# Patient Record
Sex: Male | Born: 1975 | Race: Black or African American | Hispanic: No | Marital: Married | Smoking: Current some day smoker
Health system: Southern US, Community
[De-identification: ages and names within clinical notes are randomized; demographics above are authoritative.]

## PROBLEM LIST (undated history)

## (undated) DIAGNOSIS — G4730 Sleep apnea, unspecified: Secondary | ICD-10-CM

## (undated) DIAGNOSIS — I10 Essential (primary) hypertension: Secondary | ICD-10-CM

## (undated) DIAGNOSIS — E119 Type 2 diabetes mellitus without complications: Secondary | ICD-10-CM

## (undated) DIAGNOSIS — I509 Heart failure, unspecified: Secondary | ICD-10-CM

## (undated) DIAGNOSIS — I2510 Atherosclerotic heart disease of native coronary artery without angina pectoris: Secondary | ICD-10-CM

## (undated) DIAGNOSIS — F329 Major depressive disorder, single episode, unspecified: Secondary | ICD-10-CM

## (undated) DIAGNOSIS — F32A Depression, unspecified: Secondary | ICD-10-CM

## (undated) DIAGNOSIS — E669 Obesity, unspecified: Secondary | ICD-10-CM

## (undated) DIAGNOSIS — J45909 Unspecified asthma, uncomplicated: Secondary | ICD-10-CM

## (undated) DIAGNOSIS — F309 Manic episode, unspecified: Secondary | ICD-10-CM

## (undated) DIAGNOSIS — F419 Anxiety disorder, unspecified: Secondary | ICD-10-CM

## (undated) DIAGNOSIS — E1169 Type 2 diabetes mellitus with other specified complication: Secondary | ICD-10-CM

## (undated) HISTORY — DX: Depression, unspecified: F32.A

## (undated) HISTORY — DX: Major depressive disorder, single episode, unspecified: F32.9

## (undated) HISTORY — PX: OTHER SURGICAL HISTORY: SHX169

---

## 1998-09-14 ENCOUNTER — Emergency Department (HOSPITAL_COMMUNITY): Admission: EM | Admit: 1998-09-14 | Discharge: 1998-09-14 | Payer: Self-pay | Admitting: Emergency Medicine

## 1998-09-14 ENCOUNTER — Encounter: Payer: Self-pay | Admitting: Emergency Medicine

## 1999-11-03 ENCOUNTER — Encounter: Payer: Self-pay | Admitting: Emergency Medicine

## 1999-11-03 ENCOUNTER — Emergency Department (HOSPITAL_COMMUNITY): Admission: EM | Admit: 1999-11-03 | Discharge: 1999-11-03 | Payer: Self-pay | Admitting: Emergency Medicine

## 1999-11-16 ENCOUNTER — Emergency Department (HOSPITAL_COMMUNITY): Admission: EM | Admit: 1999-11-16 | Discharge: 1999-11-16 | Payer: Self-pay | Admitting: Emergency Medicine

## 2000-02-27 ENCOUNTER — Emergency Department (HOSPITAL_COMMUNITY): Admission: EM | Admit: 2000-02-27 | Discharge: 2000-02-27 | Payer: Self-pay

## 2000-08-09 ENCOUNTER — Emergency Department (HOSPITAL_COMMUNITY): Admission: EM | Admit: 2000-08-09 | Discharge: 2000-08-09 | Payer: Self-pay | Admitting: Emergency Medicine

## 2001-07-20 ENCOUNTER — Encounter: Payer: Self-pay | Admitting: Emergency Medicine

## 2001-07-20 ENCOUNTER — Emergency Department (HOSPITAL_COMMUNITY): Admission: EM | Admit: 2001-07-20 | Discharge: 2001-07-20 | Payer: Self-pay | Admitting: Emergency Medicine

## 2002-01-14 ENCOUNTER — Emergency Department (HOSPITAL_COMMUNITY): Admission: EM | Admit: 2002-01-14 | Discharge: 2002-01-14 | Payer: Self-pay | Admitting: *Deleted

## 2003-06-30 ENCOUNTER — Emergency Department (HOSPITAL_COMMUNITY): Admission: EM | Admit: 2003-06-30 | Discharge: 2003-06-30 | Payer: Self-pay | Admitting: Emergency Medicine

## 2004-06-04 ENCOUNTER — Emergency Department (HOSPITAL_COMMUNITY): Admission: EM | Admit: 2004-06-04 | Discharge: 2004-06-04 | Payer: Self-pay | Admitting: Family Medicine

## 2007-02-25 ENCOUNTER — Emergency Department (HOSPITAL_COMMUNITY): Admission: EM | Admit: 2007-02-25 | Discharge: 2007-02-25 | Payer: Self-pay | Admitting: Emergency Medicine

## 2007-05-22 ENCOUNTER — Ambulatory Visit: Payer: Self-pay | Admitting: Internal Medicine

## 2007-05-22 ENCOUNTER — Ambulatory Visit: Payer: Self-pay | Admitting: *Deleted

## 2007-07-03 ENCOUNTER — Ambulatory Visit: Payer: Self-pay | Admitting: Internal Medicine

## 2007-07-03 LAB — CONVERTED CEMR LAB
Chlamydia, DNA Probe: NEGATIVE
GC Probe Amp, Genital: NEGATIVE

## 2007-12-07 ENCOUNTER — Emergency Department (HOSPITAL_COMMUNITY): Admission: EM | Admit: 2007-12-07 | Discharge: 2007-12-07 | Payer: Self-pay | Admitting: Emergency Medicine

## 2007-12-09 ENCOUNTER — Ambulatory Visit: Payer: Self-pay | Admitting: Internal Medicine

## 2007-12-09 LAB — CONVERTED CEMR LAB
ALT: 29 units/L (ref 0–53)
AST: 16 units/L (ref 0–37)
Albumin: 4.6 g/dL (ref 3.5–5.2)
Basophils Absolute: 0 10*3/uL (ref 0.0–0.1)
Basophils Relative: 0 % (ref 0–1)
CO2: 21 meq/L (ref 19–32)
Calcium: 8.9 mg/dL (ref 8.4–10.5)
Chloride: 104 meq/L (ref 96–112)
Cholesterol: 235 mg/dL — ABNORMAL HIGH (ref 0–200)
Helicobacter Pylori Antibody-IgG: 0.8
Hemoglobin: 15.2 g/dL (ref 13.0–17.0)
Lymphocytes Relative: 32 % (ref 12–46)
MCHC: 32.1 g/dL (ref 30.0–36.0)
Monocytes Absolute: 0.5 10*3/uL (ref 0.1–1.0)
Monocytes Relative: 7 % (ref 3–12)
Neutro Abs: 3.9 10*3/uL (ref 1.7–7.7)
Neutrophils Relative %: 58 % (ref 43–77)
Potassium: 4.3 meq/L (ref 3.5–5.3)
RBC: 5.52 M/uL (ref 4.22–5.81)

## 2008-04-19 ENCOUNTER — Emergency Department (HOSPITAL_COMMUNITY): Admission: EM | Admit: 2008-04-19 | Discharge: 2008-04-19 | Payer: Self-pay | Admitting: Emergency Medicine

## 2008-04-24 ENCOUNTER — Ambulatory Visit: Payer: Self-pay | Admitting: Psychiatry

## 2008-04-24 ENCOUNTER — Emergency Department (HOSPITAL_COMMUNITY): Admission: EM | Admit: 2008-04-24 | Discharge: 2008-04-24 | Payer: Self-pay | Admitting: Emergency Medicine

## 2008-04-24 ENCOUNTER — Inpatient Hospital Stay (HOSPITAL_COMMUNITY): Admission: AD | Admit: 2008-04-24 | Discharge: 2008-04-27 | Payer: Self-pay | Admitting: Psychiatry

## 2008-06-15 ENCOUNTER — Ambulatory Visit: Payer: Self-pay | Admitting: Internal Medicine

## 2008-06-15 ENCOUNTER — Encounter (INDEPENDENT_AMBULATORY_CARE_PROVIDER_SITE_OTHER): Payer: Self-pay | Admitting: Adult Health

## 2008-06-15 LAB — CONVERTED CEMR LAB
Chlamydia, DNA Probe: POSITIVE — AB
GC Probe Amp, Genital: NEGATIVE
HIV-1 antibody: NEGATIVE
HIV-2 Ab: NEGATIVE
HIV: REACTIVE

## 2008-09-25 ENCOUNTER — Ambulatory Visit: Payer: Self-pay | Admitting: Internal Medicine

## 2008-09-25 ENCOUNTER — Ambulatory Visit (HOSPITAL_COMMUNITY): Admission: RE | Admit: 2008-09-25 | Discharge: 2008-09-25 | Payer: Self-pay | Admitting: Internal Medicine

## 2008-12-18 ENCOUNTER — Ambulatory Visit: Payer: Self-pay | Admitting: Family Medicine

## 2009-03-23 ENCOUNTER — Encounter (INDEPENDENT_AMBULATORY_CARE_PROVIDER_SITE_OTHER): Payer: Self-pay | Admitting: Adult Health

## 2009-03-23 ENCOUNTER — Ambulatory Visit: Payer: Self-pay | Admitting: Family Medicine

## 2010-06-28 LAB — BASIC METABOLIC PANEL
BUN: 8 mg/dL (ref 6–23)
CO2: 23 mEq/L (ref 19–32)
Chloride: 105 mEq/L (ref 96–112)
Glucose, Bld: 109 mg/dL — ABNORMAL HIGH (ref 70–99)
Potassium: 3.7 mEq/L (ref 3.5–5.1)

## 2010-06-28 LAB — URINE MICROSCOPIC-ADD ON

## 2010-06-28 LAB — URINALYSIS, ROUTINE W REFLEX MICROSCOPIC
Glucose, UA: NEGATIVE mg/dL
Ketones, ur: NEGATIVE mg/dL
Leukocytes, UA: NEGATIVE
Nitrite: NEGATIVE
Protein, ur: 100 mg/dL — AB
Urobilinogen, UA: 0.2 mg/dL (ref 0.0–1.0)

## 2010-06-28 LAB — CBC
HCT: 46.7 % (ref 39.0–52.0)
MCHC: 34.2 g/dL (ref 30.0–36.0)
MCV: 83 fL (ref 78.0–100.0)
Platelets: 285 10*3/uL (ref 150–400)
RDW: 13.1 % (ref 11.5–15.5)

## 2010-06-28 LAB — DIFFERENTIAL
Basophils Absolute: 0 10*3/uL (ref 0.0–0.1)
Eosinophils Absolute: 0.1 10*3/uL (ref 0.0–0.7)
Eosinophils Relative: 1 % (ref 0–5)

## 2010-06-28 LAB — RAPID URINE DRUG SCREEN, HOSP PERFORMED
Amphetamines: NOT DETECTED
Benzodiazepines: NOT DETECTED
Cocaine: NOT DETECTED
Tetrahydrocannabinol: POSITIVE — AB

## 2010-07-26 NOTE — Consult Note (Signed)
NAMELINC, RENNE      ACCOUNT NO.:  [id number]   MEDICAL RECORD NO.:  [id number]          PATIENT TYPE:  IPS   LOCATION:  0300                          FACILITY:  [facility]   PHYSICIAN:  Antonietta Breach, M.D.  DATE OF BIRTH:  07-Feb-1976   DATE OF CONSULTATION:  04/27/2008  DATE OF DISCHARGE:  04/27/2008                                 CONSULTATION   REQUESTING PHYSICIAN:  [facility].   REASON FOR CONSULTATION:  Psychosis.   HISTORY OF PRESENT ILLNESS:  [name] is a 35 year old male  who presented to the [facility] with severe hallucinations and  delusions.  He has been experiencing command destructive auditory  hallucinations towards himself and others.  He is experiencing  catastrophic anxiety.  He is aware of many of the symptoms and wants  relief.  He is not combative.  He is cooperative and redirectable.  He  does have intact orientation and memory function.  He was on psychiatric  medication for previous similar episode but stopped it.   When [name] was 35 years old, he had a similar episode and  was admitted to the [facility] unit.  He does not  recall the medication.   FAMILY PSYCHIATRIC HISTORY:  None known.   SOCIAL HISTORY:  [name] does have a grandmother who lives  in the area.   PAST MEDICAL HISTORY:  Please see the emergency room physician's record.   MENTAL STATUS EXAM:  [name] is alert.  He has a very  anxious affect.  Mood is extremely anxious.  He is oriented to all  spheres.  His memory is intact to immediate, recent and remote.  His  speech is pressured.  Thought process is coherent.  Thought content:  Severe hallucinations as described above.  His insight is partial.  His  judgment is impaired.   ASSESSMENT:  AXIS I:  293.82.  Psychotic disorder, not otherwise  specified.  AXIS II:  Deferred.  AXIS III:  See the emergency physician's record.  AXIS IV:  Primary support group.  AXIS V:  20.   RECOMMENDATIONS:  1. Would admit to an inpatient psychiatric ward.  2. Would provide Ativan 0.5 mg to 4 mg p.o., IM or IV q. 1 hour p.r.n.  3. If necessary, would utilize Geodon 20 mg IM t.i.d. p.r.n.,failed      Ativan.      Antonietta Breach, M.D.  Electronically Signed     JW/MEDQ  D:  04/27/2008  T:  04/28/2008  Job:  [id number]

## 2010-07-26 NOTE — Discharge Summary (Signed)
Jordan Sparks, Jordan Sparks      ACCOUNT NO.:  [id number]   MEDICAL RECORD NO.:  [id number]          PATIENT TYPE:  IPS   LOCATION:  0300                          FACILITY:  BH   PHYSICIAN:  Anselm Jungling, MD  DATE OF BIRTH:  30-Aug-1975   DATE OF ADMISSION:  04/24/2008  DATE OF DISCHARGE:  04/27/2008                               DISCHARGE SUMMARY   IDENTIFYING DATA AND REASON FOR ADMISSION:  The patient is a 35 year old  single male who was admitted to inpatient psychiatry due to concerns  regarding auditory hallucinations and expressed homicidal ideation.  Please refer to the admission note for further details pertaining to the  symptoms, circumstances and history that led to his hospitalization.  He  was given an initial Axis I diagnosis of psychosis, NOS.   MEDICAL AND LABORATORY:  The patient was medically and physically  assessed by the psychiatric nurse practitioner.  He was in good health  without any active or chronic medical problems.  There were no  significant medical issues.   HOSPITAL COURSE:  The patient was admitted to the adult inpatient  psychiatric service.  He presented as a large male who initially had  difficulty responding to questions regarding his orientation.  He fell  asleep rapidly, and this was probably due to p.r.n. medication that he  had received.   However, the following day, the patient was up, dressed, and groomed.  He was pleasant, alert, relaxed, very polite, and nonpsychotic.  He  indicated that he had no further experience of auditory hallucinations  since he first received psychotropic medication with [place].  He slept very  well.  He seemed to have good insight and reality testing, and although  he understood and accepted that he had an episode of psychosis and  severe mental disorganization, he had difficulty explaining or  presenting any hypothesis as to why this had happened to him.  He denied  flatly any psychotropic drug use or  alcohol abuse.  He indicated that he  thought he had been stressed recently and needed help in coping with  this, and indicated accordingly that he was very open to outpatient  psychotherapy.  He was agreeable to his family being contacted.  The  patient's plan was to live with his grandmother, and he gave permission  for the case manager to call the grandmother and confirm that he could  live with her.  She indicated to the case manager that he could indeed  stay with her and she indicated that she had no concerns about the  patient being discharged to her on that day, the fourth hospital day.  The patient agreed to the  following aftercare plan.   AFTERCARE:  The patient was to follow-up at [facility] with an appointment on May 06, 2008 at 1:30 p.m.  He was  also referred to [facility] on [facility], where he was  instructed as to how and when to call them for an intake appointment.   DISCHARGE MEDICATIONS:  None.   DISCHARGE DIAGNOSES:  AXIS I: Brief psychotic episode, NOS, unknown  etiology, resolved.  AXIS II:  Deferred.  AXIS III: No acute or chronic illnesses.  AXIS IV: Stressors severe.  AXIS V: GAF on discharge 75.      Anselm Jungling, MD  Electronically Signed     SPB/MEDQ  D:  04/27/2008  T:  04/27/2008  Job:  [id number]

## 2010-12-16 LAB — DIFFERENTIAL
Basophils Absolute: 0.1
Eosinophils Relative: 3
Lymphocytes Relative: 23
Monocytes Absolute: 0.7
Monocytes Relative: 9

## 2010-12-16 LAB — COMPREHENSIVE METABOLIC PANEL
AST: 33
Albumin: 3.7
Alkaline Phosphatase: 83
Chloride: 106
Creatinine, Ser: 1.12
GFR calc Af Amer: >60
Potassium: 3.6
Total Bilirubin: 0.5
Total Protein: 6.5

## 2010-12-16 LAB — URINALYSIS, ROUTINE W REFLEX MICROSCOPIC
Bilirubin Urine: NEGATIVE
Glucose, UA: NEGATIVE
Hgb urine dipstick: NEGATIVE
Specific Gravity, Urine: 1.029
Urobilinogen, UA: 1

## 2010-12-16 LAB — CBC
Platelets: 253
RDW: 13.2
WBC: 7.7

## 2011-07-31 ENCOUNTER — Emergency Department (HOSPITAL_COMMUNITY)
Admission: EM | Admit: 2011-07-31 | Discharge: 2011-07-31 | Disposition: A | Discharge status: Home / Self Care | Payer: Self-pay | Attending: Emergency Medicine | Admitting: Emergency Medicine

## 2011-07-31 ENCOUNTER — Encounter (HOSPITAL_COMMUNITY): Payer: Self-pay | Admitting: *Deleted

## 2011-07-31 DIAGNOSIS — F319 Bipolar disorder, unspecified: Secondary | ICD-10-CM | POA: Insufficient documentation

## 2011-07-31 HISTORY — DX: Manic episode, unspecified: F30.9

## 2011-07-31 HISTORY — DX: Anxiety disorder, unspecified: F41.9

## 2011-07-31 MED ORDER — LORAZEPAM 1 MG PO TABS
2.0000 mg | ORAL_TABLET | Freq: Once | ORAL | Status: AC
  Administered 2011-07-31: 2 mg via ORAL
  Filled 2011-07-31: qty 2

## 2011-07-31 NOTE — Discharge Instructions (Signed)
Follow up at the [facility] or [facility] tomorrow for treatment.

## 2011-07-31 NOTE — ED Notes (Signed)
Patient states he has been out of mental health medications x 8 months and he feels that if he doesn't get medications refilled he feels he may have outburst,  Patient denies suicidal or homicidal thoughts, he feels increase in anxiety and on edge

## 2011-07-31 NOTE — ED Provider Notes (Signed)
History  This chart was scribed for Cheri Guppy, MD by Bennett Scrape. This patient was seen in room STRE8/STRE8 and the patient's care was started at 2:59PM.  CSN: [id number]  Arrival date & time 07/31/11  1259   First MD Initiated Contact with Patient 07/31/11 1459      Chief Complaint  Patient presents with  . Medication Refill    mental health meds    The history is provided by the patient. No language interpreter was used.    Jordan Sparks is a 36 y.o. male with a h/o manic disorder who presents to the Emergency Department requesting a lithium, adderall and androxan   refill. Pt states that he has been off of his mental health medications for 8 months "due to my own stupidity". Pt states that he feels like if he doesn't get his medications refilled he might hurt someone. Pt states that his anxiety is increasing and he feels "on the edge". He denies SI and HI currently. He denies any other symptoms including fever, cough and nausea. He is a current everyday smoker but denies alcohol use.   Past Medical History  Diagnosis Date  . Anxiety   . Manic disorder     History reviewed. No pertinent past surgical history.  No family history on file.  History  Substance Use Topics  . Smoking status: Current Everyday Smoker  . Smokeless tobacco: Not on file  . Alcohol Use: No      Review of Systems  Constitutional: Negative for fever and chills.  Respiratory: Negative for cough and shortness of breath.   Gastrointestinal: Negative for nausea and vomiting.  Psychiatric/Behavioral: Negative for suicidal ideas.    Allergies  Review of patient's allergies indicates no known allergies.  Home Medications  No current outpatient prescriptions on file.  Triage Vitals: BP 134/91  Temp(Src) 98.3 F (36.8 C) (Oral)  Resp 20  SpO2 94%  Physical Exam  Nursing note and vitals reviewed. Constitutional: He is oriented to person, place, and time. He appears  well-developed and well-nourished. No distress.  HENT:  Head: Normocephalic and atraumatic.  Eyes: EOM are normal.  Neck: Neck supple. No tracheal deviation present.  Cardiovascular: Normal rate.   Pulmonary/Chest: Effort normal. No respiratory distress.  Musculoskeletal: Normal range of motion.  Neurological: He is alert and oriented to person, place, and time.  Skin: Skin is warm and dry.  Psychiatric: He has a normal mood and affect. His behavior is normal. His speech is rapid and/or pressured. He expresses no homicidal and no suicidal ideation.    ED Course  Procedures (including critical care time)  DIAGNOSTIC STUDIES: Oxygen Saturation is 94% on room air, adequate by my interpretation.    COORDINATION OF CARE: 3:05PM-Discussed need for consultation and pt agreed.  3:14PM-Asked psych to look over pt's case to get him the proper treatment.  3:30PM-Informed pt that I have requested for psych to give an evaluation and that we are still waiting on their evaluation.  4:03PM-informed pt of the two appointments that psych has set up. Pt was given Ativan.  Labs Reviewed - No data to display No results found.   No diagnosis found.    MDM  manic      I personally performed the services described in this documentation, which was scribed in my presence. The recorded information has been reviewed and considered.     Cheri Guppy, MD 08/03/11 2303

## 2012-04-08 ENCOUNTER — Ambulatory Visit (HOSPITAL_COMMUNITY)
Admission: RE | Admit: 2012-04-08 | Discharge: 2012-04-08 | Disposition: A | Discharge status: Home / Self Care | Payer: Self-pay | Attending: Psychiatry | Admitting: Psychiatry

## 2012-04-20 ENCOUNTER — Encounter (HOSPITAL_COMMUNITY): Payer: Self-pay | Admitting: Behavioral Health

## 2012-04-20 ENCOUNTER — Telehealth (HOSPITAL_COMMUNITY): Payer: Self-pay | Admitting: Behavioral Health

## 2012-04-20 NOTE — BH Assessment (Addendum)
Assessment Note  Late Note from 04/08/12.  Jordan Sparks is an 37 y.o. male and presents to [facility] as a walk-in due to increased depressive symptoms and accompanied by his live in girlfriend. Pt denies SI, HI, AV hallucinations, delusions or psychosis. Pt confirms sa use of cannabis daily, last use 04/06/12 and denies any other sa use. Pt reports occasional beer drinking, last use 04/06/12. Pt reports that he has run out of his medications for his past diagnosis of bi-polar and depression. Pt reports that his agitation and irritability are increasing and "I worry about being a burden to my girl. 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 Pt reports that he constantly has thought of "I'm supposed to be taking care of my family (has a 2 yo son and 63 yo daughter) and I can't so I feel guilty all the time". Pt said "I worry about her (girlfriend) having to take care of all the bills". Pt reports that he has been in a 10 yr relationship with her and she "is my only support, she been there for me through all of this". Pt confirms that he receives outpatient mh and medication services through [facility] and missed his appointment earlier this month. Pt reports an increase in irritability,agitation, isolation, loss of interest in usual pleasure, guilt, tearfulness and decreases in sleep (2-3 hrs in 24), concentration and recent memory, pt's remote memory is intact. Pt denies physical, sexual and emotional abuse. Pt is unaware of family mh hx. Pt reports a recent encounter with a male family and his increased agitation and anger that "I wanted to just smash him cuz he kept pushing my buttons, even after I told him to back of". Pt confirms that he needs to get back on track with his appointments and medication regimen with his outpatient provider. Pt does not meet criteria for inpatient admission and is MSE cleared by Verne Spurr, PA. Pt is referred back to [facility] for follow-up. Ranae Pila, LCAS  Axis I: Mood Disorder NOS Axis II: Deferred Axis III:  Past Medical History  Diagnosis Date  . Anxiety   . Manic disorder   . Depression    Axis IV: economic problems, occupational problems, problems related to social environment and problems with primary support group Axis V: 41-50 serious symptoms  Past Medical History:  Past Medical History  Diagnosis Date  . Anxiety   . Manic disorder   . Depression     No past surgical history on file.  Family History: No family history on file.  Social History:  reports that he has been smoking.  He does not have any smokeless tobacco history on file. He reports that he does not drink alcohol or use illicit drugs.  Additional Social History:  Alcohol / Drug Use Pain Medications: pt denies Prescriptions: pt denies Over the Counter: pt denies History of alcohol / drug use?: Yes Substance #1 Name of Substance 1: etoh 1 - Age of First Use: 37 yo 1 - Amount (size/oz): varies 1 - Frequency: weekly 1 - Duration: 21 yrs 1 - Last Use / Amount: 04/06/12  CIWA: CIWA-Ar Nausea and Vomiting: no nausea and no vomiting Tactile Disturbances: none Tremor: no tremor Auditory Disturbances: not present Paroxysmal Sweats: no sweat visible Visual Disturbances: not present Anxiety: two Headache, Fullness in Head: none present Agitation: somewhat more than normal activity Orientation and Clouding of Sensorium: oriented and can do serial additions CIWA-Ar Total: 3 COWS: Clinical  Opiate Withdrawal Scale (COWS) Sweating: No report of chills or flushing Restlessness: Reports difficulty sitting still, but is able to do so Pupil Size: Pupils pinned or normal size for room light Bone or Joint Aches: Not present Runny Nose or Tearing: Not present GI Upset: No GI symptoms Tremor: No tremor Yawning: No yawning Anxiety or Irritability: Patient obviously irritable/anxious Gooseflesh Skin: Skin is smooth  Allergies: No Known  Allergies  Home Medications:  (Not in a hospital admission)  OB/GYN Status:  No LMP for male patient.  General Assessment Data Location of Assessment: [facility] Assessment Services Living Arrangements: Spouse/significant other;Children Can pt return to current living arrangement?: Yes Admission Status: Voluntary Is patient capable of signing voluntary admission?: Yes Transfer from: Home Referral Source: Self/Family/Friend     Risk to self Suicidal Ideation: No Suicidal Intent: No Is patient at risk for suicide?: No Suicidal Plan?: No Access to Means: No What has been your use of drugs/alcohol within the last 12 months?:  (pt reports smoking cannabis) Previous Attempts/Gestures: No How many times?:  (0) Other Self Harm Risks:  (none noted) Triggers for Past Attempts: None known Intentional Self Injurious Behavior: None Family Suicide History: Unknown Recent stressful life event(s): Job Loss;Financial Problems Persecutory voices/beliefs?: No Depression: Yes Depression Symptoms: Insomnia;Isolating;Fatigue;Guilt;Feeling worthless/self pity;Feeling angry/irritable Suicide prevention information given to non-admitted patients: Not applicable  Risk to Others Homicidal Ideation: No Thoughts of Harm to Others: Yes-Currently Present Comment - Thoughts of Harm to Others:  (pt reports a male family member pushing his buttons) Current Homicidal Intent: No Current Homicidal Plan: No Access to Homicidal Means: No Identified Victim:  (none noted) History of harm to others?: No Assessment of Violence: None Noted Violent Behavior Description:  (alert, calm, cooperative) Does patient have access to weapons?: No Criminal Charges Pending?: No Does patient have a court date: No  Psychosis Hallucinations: None noted Delusions: None noted  Mental Status Report Appear/Hygiene:  (casual winter wear) Eye Contact: Good Motor Activity: Freedom of movement Speech: Logical/coherent Level of  Consciousness: Alert Mood: Depressed Affect: Appropriate to circumstance Anxiety Level: Minimal Thought Processes: Coherent;Relevant Judgement: Unimpaired Orientation: Person;Place;Time;Situation;Appropriate for developmental age Obsessive Compulsive Thoughts/Behaviors: Moderate (pt reports not wanting to burden his significant other)  Cognitive Functioning Concentration: Decreased Memory: Remote Intact;Recent Intact IQ: Average Insight: Good Impulse Control: Fair Appetite: Good Weight Loss:  (0) Weight Gain:  (0) Sleep: Decreased Total Hours of Sleep:  (pt reports 2-3/24) Vegetative Symptoms: None  ADLScreening [facility] Services) Patient's cognitive ability adequate to safely complete daily activities?: Yes Patient able to express need for assistance with ADLs?: Yes Independently performs ADLs?: Yes (appropriate for developmental age)  Abuse/Neglect [facility]) Physical Abuse: Denies Verbal Abuse: Denies Sexual Abuse: Denies  Prior Inpatient Therapy Prior Inpatient Therapy: Yes Prior Therapy Dates:  (pt reports 2013 and maybe 2012) Prior Therapy Facilty/Provider(s):  [facility]) Reason for Treatment:  (pt reports depression)  Prior Outpatient Therapy Prior Outpatient Therapy: Yes Prior Therapy Dates:  (pt reports 2012, 2013, 2014) Prior Therapy Facilty/Provider(s):  (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) Reason for Treatment:  (pt reports depression)  ADL Screening (condition at time of admission) Patient's cognitive ability adequate to safely complete daily activities?: Yes Patient able to express need for assistance with ADLs?: Yes Independently performs ADLs?: Yes (appropriate for developmental age) Weakness of Legs: None Weakness of Arms/Hands: None  Home Assistive Devices/Equipment Home Assistive Devices/Equipment: None  Therapy Consults (therapy consults require a physician order) PT Evaluation Needed: No OT Evalulation Needed: No SLP Evaluation Needed: No Abuse/Neglect  Assessment (  Assessment to be complete while patient is alone) Physical Abuse: Denies Verbal Abuse: Denies Sexual Abuse: Denies Exploitation of patient/patient's resources: Denies Self-Neglect: Denies Values / Beliefs Cultural Requests During Hospitalization: None Spiritual Requests During Hospitalization: None Consults Spiritual Care Consult Needed: No Social Work Consult Needed: No Merchant navy officer (For Healthcare) Advance Directive: Patient does not have advance directive Pre-existing out of facility DNR order (yellow form or pink MOST form): No Nutrition Screen- [facility] Adult/WL/AP Patient's home diet: Regular Have you recently lost weight without trying?: No Have you been eating poorly because of a decreased appetite?: No Malnutrition Screening Tool Score: 0  Additional Information 1:1 In Past 12 Months?: No CIRT Risk: No Elopement Risk: No Does patient have medical clearance?: No     Disposition: Pt completed MSE with Verne Spurr, PA and pt referred back to home provider [facility] to continue outpatient and medication services. Disposition Disposition of Patient: Outpatient treatment;Referred to (pt referred back to home provider [facility]) Type of outpatient treatment: Adult Patient referred to: [facility] to continue services)  On Site Evaluation by:   Reviewed with Physician:     Manual Meier 04/20/2012 9:46 AM

## 2015-10-01 ENCOUNTER — Ambulatory Visit (HOSPITAL_COMMUNITY): Admission: EM | Admit: 2015-10-01 | Discharge: 2015-10-01 | Discharge status: Absent without leave | Payer: Self-pay

## 2015-10-26 DIAGNOSIS — R430 Anosmia: Secondary | ICD-10-CM | POA: Insufficient documentation

## 2015-10-26 DIAGNOSIS — Q892 Congenital malformations of other endocrine glands: Secondary | ICD-10-CM | POA: Insufficient documentation

## 2015-10-26 DIAGNOSIS — J343 Hypertrophy of nasal turbinates: Secondary | ICD-10-CM | POA: Insufficient documentation

## 2015-10-26 DIAGNOSIS — K219 Gastro-esophageal reflux disease without esophagitis: Secondary | ICD-10-CM | POA: Insufficient documentation

## 2015-10-26 DIAGNOSIS — F17200 Nicotine dependence, unspecified, uncomplicated: Secondary | ICD-10-CM | POA: Insufficient documentation

## 2015-10-26 DIAGNOSIS — R0683 Snoring: Secondary | ICD-10-CM | POA: Insufficient documentation

## 2015-10-26 DIAGNOSIS — J324 Chronic pansinusitis: Secondary | ICD-10-CM | POA: Insufficient documentation

## 2015-12-02 ENCOUNTER — Other Ambulatory Visit (HOSPITAL_BASED_OUTPATIENT_CLINIC_OR_DEPARTMENT_OTHER): Payer: Self-pay

## 2015-12-02 DIAGNOSIS — R0683 Snoring: Secondary | ICD-10-CM

## 2015-12-26 ENCOUNTER — Ambulatory Visit (HOSPITAL_BASED_OUTPATIENT_CLINIC_OR_DEPARTMENT_OTHER): Payer: Self-pay | Attending: Otolaryngology | Admitting: Internal Medicine

## 2016-01-04 ENCOUNTER — Ambulatory Visit (HOSPITAL_BASED_OUTPATIENT_CLINIC_OR_DEPARTMENT_OTHER): Payer: Self-pay | Attending: Otolaryngology | Admitting: Internal Medicine

## 2016-01-04 VITALS — Ht 70.0 in | Wt 290.0 lb

## 2016-01-04 DIAGNOSIS — G4733 Obstructive sleep apnea (adult) (pediatric): Secondary | ICD-10-CM

## 2016-01-04 DIAGNOSIS — R0683 Snoring: Secondary | ICD-10-CM | POA: Insufficient documentation

## 2016-01-04 DIAGNOSIS — E669 Obesity, unspecified: Secondary | ICD-10-CM | POA: Insufficient documentation

## 2016-01-15 DIAGNOSIS — R0683 Snoring: Secondary | ICD-10-CM

## 2016-01-15 NOTE — Procedures (Signed)
  Patient Name: Jordan Sparks, Hosp Date: 01/04/2016 Gender: Male D.O.B: [birth year] Age (years): 40 Referring Provider: Serena Colonel Height (inches): 70 Interpreting Physician: Jetty Duhamel MD, ABSM Weight (lbs): 290 RPSGT: Ulyess Mort BMI: 42 MRN: [id number] Neck Size: 19.00 CLINICAL INFORMATION Sleep Study Type: NPSG Indication for sleep study: Hypertension, Obesity Epworth Sleepiness Score: 19  SLEEP STUDY TECHNIQUE As per the AASM Manual for the Scoring of Sleep and Associated Events v2.3 (April 2016) with a hypopnea requiring 4% desaturations. The channels recorded and monitored were frontal, central and occipital EEG, electrooculogram (EOG), submentalis EMG (chin), nasal and oral airflow, thoracic and abdominal wall motion, anterior tibialis EMG, snore microphone, electrocardiogram, and pulse oximetry.  MEDICATIONS Medications self-administered by patient taken the night of the study : none reported  SLEEP ARCHITECTURE The study was initiated at 9:48:01 PM and ended at 3:48:33 AM. Sleep onset time was 9.4 minutes and the sleep efficiency was 73.2%. The total sleep time was 264.0 minutes. Stage REM latency was 222.5 minutes. The patient spent 25.38% of the night in stage N1 sleep, 73.48% in stage N2 sleep, 0.00% in stage N3 and 1.14% in REM. Alpha intrusion was absent. Supine sleep was 8.01%.  RESPIRATORY PARAMETERS The overall apnea/hypopnea index (AHI) was 29.8 per hour. There were 18 total apneas, including 14 obstructive, 2 central and 2 mixed apneas. There were 113 hypopneas and 48 RERAs. The AHI during Stage REM sleep was 0.0 per hour. AHI while supine was 107.8 per hour. The mean oxygen saturation was 95.13%. The minimum SpO2 during sleep was 84.00%. Moderate snoring was noted during this study.  CARDIAC DATA The 2 lead EKG demonstrated sinus rhythm. The mean heart rate was 73.14 beats per minute. Other EKG findings include: PACs.  LEG MOVEMENT  DATA The total PLMS were 0 with a resulting PLMS index of 0.00. Associated arousal with leg movement index was 0.0 .  IMPRESSIONS - Moderate obstructive sleep apnea occurred during this study (AHI = 29.8/h). - No significant central sleep apnea occurred during this study (CAI = 0.5/h). - Mild oxygen desaturation was noted during this study (Min O2 = 84.00%). - The patient snored with Moderate snoring volume. - No cardiac abnormalities were noted during this study. - Clinically significant periodic limb movements did not occur during sleep. No significant associated arousals. - Bathroom x 4  DIAGNOSIS - Obstructive Sleep Apnea (327.23 [G47.33 ICD-10])  RECOMMENDATIONS - Therapeutic CPAP titration to determine optimal pressure required to alleviate sleep disordered breathing. - Positional therapy avoiding supine position during sleep. - Avoid alcohol, sedatives and other CNS depressants that may worsen sleep apnea and disrupt normal sleep architecture. - Sleep hygiene should be reviewed to assess factors that may improve sleep quality. - Weight management and regular exercise should be initiated or continued if appropriate.  [Electronically signed] 01/15/2016 12:46 PM  Jetty Duhamel MD, ABSM Diplomate, American Board of Sleep Medicine   NPI: [id number]  Waymon Budge Diplomate, American Board of Sleep Medicine  ELECTRONICALLY SIGNED ON:  01/15/2016, 12:46 PM [facility] PH: [phone]   FX: [phone] ACCREDITED BY THE AMERICAN ACADEMY OF SLEEP MEDICINE

## 2016-03-24 DIAGNOSIS — F17210 Nicotine dependence, cigarettes, uncomplicated: Secondary | ICD-10-CM | POA: Insufficient documentation

## 2016-03-24 DIAGNOSIS — Z5321 Procedure and treatment not carried out due to patient leaving prior to being seen by health care provider: Secondary | ICD-10-CM | POA: Insufficient documentation

## 2016-03-24 DIAGNOSIS — R1110 Vomiting, unspecified: Secondary | ICD-10-CM | POA: Insufficient documentation

## 2016-03-24 DIAGNOSIS — R52 Pain, unspecified: Secondary | ICD-10-CM | POA: Insufficient documentation

## 2016-03-24 DIAGNOSIS — R197 Diarrhea, unspecified: Secondary | ICD-10-CM | POA: Insufficient documentation

## 2016-03-24 NOTE — ED Triage Notes (Signed)
Patient is from home and transported via [facility]. Pt is experiencing flu-like symptoms of chills, body aches, vomiting and diarrhea. Pt reported to EMS he took IBUPROFEN but unknown time of administration.

## 2016-03-25 ENCOUNTER — Encounter (HOSPITAL_COMMUNITY): Payer: Self-pay | Admitting: Family Medicine

## 2016-03-25 ENCOUNTER — Emergency Department (HOSPITAL_COMMUNITY)
Admission: EM | Admit: 2016-03-25 | Discharge: 2016-03-25 | Disposition: A | Discharge status: Home / Self Care | Payer: Self-pay | Attending: Emergency Medicine | Admitting: Emergency Medicine

## 2016-03-25 MED ORDER — ACETAMINOPHEN 325 MG PO TABS
650.0000 mg | ORAL_TABLET | Freq: Once | ORAL | Status: AC | PRN
  Administered 2016-03-25: 650 mg via ORAL
  Filled 2016-03-25: qty 2

## 2016-03-25 NOTE — ED Notes (Signed)
Pt called x2 to be roomed.  Pt did not respond.  RN notified.

## 2016-03-25 NOTE — ED Notes (Signed)
Pt called for third time  No response from lobby 

## 2018-02-08 ENCOUNTER — Other Ambulatory Visit: Payer: Self-pay

## 2018-02-08 ENCOUNTER — Observation Stay (HOSPITAL_COMMUNITY)
Admission: EM | Admit: 2018-02-08 | Discharge: 2018-02-09 | Disposition: A | Discharge status: Home / Self Care | Payer: Self-pay | Attending: Internal Medicine | Admitting: Internal Medicine

## 2018-02-08 ENCOUNTER — Emergency Department (HOSPITAL_COMMUNITY): Payer: Self-pay

## 2018-02-08 ENCOUNTER — Encounter (HOSPITAL_COMMUNITY): Payer: Self-pay

## 2018-02-08 DIAGNOSIS — R0789 Other chest pain: Secondary | ICD-10-CM | POA: Diagnosis present

## 2018-02-08 DIAGNOSIS — F419 Anxiety disorder, unspecified: Secondary | ICD-10-CM | POA: Diagnosis present

## 2018-02-08 DIAGNOSIS — R112 Nausea with vomiting, unspecified: Secondary | ICD-10-CM | POA: Diagnosis present

## 2018-02-08 DIAGNOSIS — R739 Hyperglycemia, unspecified: Secondary | ICD-10-CM | POA: Diagnosis present

## 2018-02-08 DIAGNOSIS — F39 Unspecified mood [affective] disorder: Secondary | ICD-10-CM | POA: Diagnosis present

## 2018-02-08 DIAGNOSIS — F309 Manic episode, unspecified: Secondary | ICD-10-CM | POA: Diagnosis present

## 2018-02-08 DIAGNOSIS — R079 Chest pain, unspecified: Secondary | ICD-10-CM

## 2018-02-08 DIAGNOSIS — F329 Major depressive disorder, single episode, unspecified: Secondary | ICD-10-CM | POA: Insufficient documentation

## 2018-02-08 DIAGNOSIS — E876 Hypokalemia: Secondary | ICD-10-CM | POA: Diagnosis present

## 2018-02-08 DIAGNOSIS — I10 Essential (primary) hypertension: Secondary | ICD-10-CM | POA: Diagnosis present

## 2018-02-08 DIAGNOSIS — E1165 Type 2 diabetes mellitus with hyperglycemia: Principal | ICD-10-CM | POA: Insufficient documentation

## 2018-02-08 DIAGNOSIS — Z79899 Other long term (current) drug therapy: Secondary | ICD-10-CM | POA: Insufficient documentation

## 2018-02-08 DIAGNOSIS — F32A Depression, unspecified: Secondary | ICD-10-CM | POA: Diagnosis present

## 2018-02-08 DIAGNOSIS — D72829 Elevated white blood cell count, unspecified: Secondary | ICD-10-CM | POA: Diagnosis present

## 2018-02-08 DIAGNOSIS — K219 Gastro-esophageal reflux disease without esophagitis: Secondary | ICD-10-CM | POA: Diagnosis present

## 2018-02-08 DIAGNOSIS — Z7984 Long term (current) use of oral hypoglycemic drugs: Secondary | ICD-10-CM | POA: Insufficient documentation

## 2018-02-08 HISTORY — DX: Essential (primary) hypertension: I10

## 2018-02-08 LAB — CBC WITH DIFFERENTIAL/PLATELET
Abs Immature Granulocytes: 0 10*3/uL (ref 0.00–0.07)
BASOS PCT: 0 %
Basophils Absolute: 0 10*3/uL (ref 0.0–0.1)
EOS ABS: 0.2 10*3/uL (ref 0.0–0.5)
EOS PCT: 2 %
HCT: 41.9 % (ref 39.0–52.0)
Hemoglobin: 14 g/dL (ref 13.0–17.0)
Lymphocytes Relative: 12 %
Lymphs Abs: 1.4 10*3/uL (ref 0.7–4.0)
MCH: 26.2 pg (ref 26.0–34.0)
MCHC: 33.4 g/dL (ref 30.0–36.0)
MCV: 78.5 fL — AB (ref 80.0–100.0)
MONO ABS: 0.5 10*3/uL (ref 0.1–1.0)
Monocytes Relative: 4 %
Neutro Abs: 9.3 10*3/uL — ABNORMAL HIGH (ref 1.7–7.7)
Neutrophils Relative %: 82 %
PLATELETS: 295 10*3/uL (ref 150–400)
RBC: 5.34 MIL/uL (ref 4.22–5.81)
RDW: 12 % (ref 11.5–15.5)
WBC: 11.4 10*3/uL — AB (ref 4.0–10.5)
nRBC: 0 % (ref 0.0–0.2)

## 2018-02-08 LAB — COMPREHENSIVE METABOLIC PANEL
ALBUMIN: 3.6 g/dL (ref 3.5–5.0)
ALT: 31 U/L (ref 0–44)
ANION GAP: 13 (ref 5–15)
AST: 18 U/L (ref 15–41)
Alkaline Phosphatase: 112 U/L (ref 38–126)
BUN: 11 mg/dL (ref 6–20)
CO2: 25 mmol/L (ref 22–32)
Calcium: 9 mg/dL (ref 8.9–10.3)
Chloride: 92 mmol/L — ABNORMAL LOW (ref 98–111)
Creatinine, Ser: 1.25 mg/dL — ABNORMAL HIGH (ref 0.61–1.24)
GFR calc Af Amer: >60 mL/min (ref >60)
GFR calc non Af Amer: >60 mL/min (ref >60)
GLUCOSE: 663 mg/dL — AB (ref 70–99)
Potassium: 3 mmol/L — ABNORMAL LOW (ref 3.5–5.1)
SODIUM: 130 mmol/L — AB (ref 135–145)
Total Bilirubin: 0.5 mg/dL (ref 0.3–1.2)
Total Protein: 6.9 g/dL (ref 6.5–8.1)

## 2018-02-08 LAB — LIPASE, BLOOD: Lipase: 30 U/L (ref 11–51)

## 2018-02-08 LAB — TROPONIN I

## 2018-02-08 LAB — CBG MONITORING, ED
Glucose-Capillary: >600 mg/dL (ref 70–99)
Glucose-Capillary: 444 mg/dL — ABNORMAL HIGH (ref 70–99)

## 2018-02-08 LAB — INFLUENZA PANEL BY PCR (TYPE A & B)
Influenza A By PCR: NEGATIVE
Influenza B By PCR: NEGATIVE

## 2018-02-08 LAB — GLUCOSE, CAPILLARY: Glucose-Capillary: 287 mg/dL — ABNORMAL HIGH (ref 70–99)

## 2018-02-08 LAB — BRAIN NATRIURETIC PEPTIDE: B Natriuretic Peptide: 25 pg/mL (ref 0.0–100.0)

## 2018-02-08 LAB — GROUP A STREP BY PCR: GROUP A STREP BY PCR: NOT DETECTED

## 2018-02-08 LAB — I-STAT TROPONIN, ED: Troponin i, poc: 0 ng/mL (ref 0.00–0.08)

## 2018-02-08 MED ORDER — ACETAMINOPHEN 325 MG PO TABS
650.0000 mg | ORAL_TABLET | Freq: Four times a day (QID) | ORAL | Status: DC | PRN
  Administered 2018-02-09: 650 mg via ORAL
  Filled 2018-02-08: qty 2

## 2018-02-08 MED ORDER — MAGNESIUM SULFATE IN D5W 1-5 GM/100ML-% IV SOLN
1.0000 g | Freq: Once | INTRAVENOUS | Status: AC
  Administered 2018-02-08: 1 g via INTRAVENOUS
  Filled 2018-02-08: qty 100

## 2018-02-08 MED ORDER — SODIUM CHLORIDE 0.9 % IV BOLUS
1000.0000 mL | Freq: Once | INTRAVENOUS | Status: AC
  Administered 2018-02-08: 1000 mL via INTRAVENOUS

## 2018-02-08 MED ORDER — ADULT MULTIVITAMIN W/MINERALS CH: 1.0000 | ORAL_TABLET | Freq: Every day | ORAL | Status: DC | PRN

## 2018-02-08 MED ORDER — SODIUM CHLORIDE 0.9 % IV SOLN
INTRAVENOUS | Status: DC
  Administered 2018-02-08 – 2018-02-09 (×2): via INTRAVENOUS

## 2018-02-08 MED ORDER — BUPROPION HCL 75 MG PO TABS
75.0000 mg | ORAL_TABLET | Freq: Two times a day (BID) | ORAL | Status: DC
  Administered 2018-02-08 – 2018-02-09 (×2): 75 mg via ORAL
  Filled 2018-02-08 (×2): qty 1

## 2018-02-08 MED ORDER — CLONIDINE HCL 0.1 MG PO TABS
0.1000 mg | ORAL_TABLET | Freq: Two times a day (BID) | ORAL | Status: DC
  Administered 2018-02-08 – 2018-02-09 (×2): 0.1 mg via ORAL
  Filled 2018-02-08 (×2): qty 1

## 2018-02-08 MED ORDER — CARVEDILOL 3.125 MG PO TABS
3.1250 mg | ORAL_TABLET | Freq: Two times a day (BID) | ORAL | Status: DC
  Administered 2018-02-08 – 2018-02-09 (×2): 3.125 mg via ORAL
  Filled 2018-02-08 (×2): qty 1

## 2018-02-08 MED ORDER — ACETAMINOPHEN 650 MG RE SUPP: 650.0000 mg | Freq: Four times a day (QID) | RECTAL | Status: DC | PRN

## 2018-02-08 MED ORDER — ACETAMINOPHEN 325 MG PO TABS: 650.0000 mg | ORAL_TABLET | Freq: Four times a day (QID) | ORAL | Status: DC | PRN

## 2018-02-08 MED ORDER — INSULIN ASPART 100 UNIT/ML ~~LOC~~ SOLN
0.0000 [IU] | Freq: Three times a day (TID) | SUBCUTANEOUS | Status: DC
  Administered 2018-02-09: 9 [IU] via SUBCUTANEOUS
  Administered 2018-02-09: 3 [IU] via SUBCUTANEOUS

## 2018-02-08 MED ORDER — POTASSIUM CHLORIDE CRYS ER 20 MEQ PO TBCR
40.0000 meq | EXTENDED_RELEASE_TABLET | Freq: Once | ORAL | Status: AC
  Administered 2018-02-08: 40 meq via ORAL
  Filled 2018-02-08: qty 2

## 2018-02-08 MED ORDER — MORPHINE SULFATE (PF) 2 MG/ML IV SOLN: 2.0000 mg | INTRAVENOUS | Status: DC | PRN

## 2018-02-08 MED ORDER — INSULIN ASPART 100 UNIT/ML ~~LOC~~ SOLN
0.0000 [IU] | Freq: Every day | SUBCUTANEOUS | Status: DC
  Administered 2018-02-08: 3 [IU] via SUBCUTANEOUS

## 2018-02-08 MED ORDER — NICOTINE 21 MG/24HR TD PT24
21.0000 mg | MEDICATED_PATCH | Freq: Every day | TRANSDERMAL | Status: DC
  Administered 2018-02-09: 21 mg via TRANSDERMAL
  Filled 2018-02-08: qty 1

## 2018-02-08 MED ORDER — METOCLOPRAMIDE HCL 10 MG/10ML PO SOLN
5.0000 mg | Freq: Three times a day (TID) | ORAL | Status: DC | PRN
  Filled 2018-02-08: qty 5

## 2018-02-08 MED ORDER — PANTOPRAZOLE SODIUM 40 MG PO TBEC
40.0000 mg | DELAYED_RELEASE_TABLET | Freq: Every day | ORAL | Status: DC
  Administered 2018-02-08 – 2018-02-09 (×2): 40 mg via ORAL
  Filled 2018-02-08 (×2): qty 1

## 2018-02-08 MED ORDER — METOCLOPRAMIDE HCL 10 MG/10ML PO SOLN
5.0000 mg | Freq: Three times a day (TID) | ORAL | Status: DC | PRN
  Filled 2018-02-08 (×2): qty 5

## 2018-02-08 MED ORDER — LITHIUM CARBONATE 150 MG PO CAPS
150.0000 mg | ORAL_CAPSULE | Freq: Two times a day (BID) | ORAL | Status: DC
  Administered 2018-02-08 – 2018-02-09 (×2): 150 mg via ORAL
  Filled 2018-02-08 (×3): qty 1

## 2018-02-08 MED ORDER — NITROGLYCERIN 0.4 MG SL SUBL: 0.4000 mg | SUBLINGUAL_TABLET | SUBLINGUAL | Status: DC | PRN

## 2018-02-08 MED ORDER — FLUOXETINE HCL 20 MG PO CAPS
20.0000 mg | ORAL_CAPSULE | Freq: Every day | ORAL | Status: DC
  Administered 2018-02-09 (×2): 20 mg via ORAL
  Filled 2018-02-08 (×2): qty 1

## 2018-02-08 MED ORDER — VITAMIN D 25 MCG (1000 UNIT) PO TABS
2000.0000 [IU] | ORAL_TABLET | Freq: Every day | ORAL | Status: DC
  Administered 2018-02-09 (×2): 2000 [IU] via ORAL
  Filled 2018-02-08 (×2): qty 2

## 2018-02-08 MED ORDER — ZOLPIDEM TARTRATE 5 MG PO TABS: 5.0000 mg | ORAL_TABLET | Freq: Every evening | ORAL | Status: DC | PRN

## 2018-02-08 MED ORDER — ALUM & MAG HYDROXIDE-SIMETH 200-200-20 MG/5ML PO SUSP: 30.0000 mL | Freq: Four times a day (QID) | ORAL | Status: DC | PRN

## 2018-02-08 MED ORDER — INSULIN GLARGINE 100 UNIT/ML ~~LOC~~ SOLN
8.0000 [IU] | Freq: Every day | SUBCUTANEOUS | Status: DC
  Administered 2018-02-08 – 2018-02-09 (×2): 8 [IU] via SUBCUTANEOUS
  Filled 2018-02-08 (×2): qty 0.08

## 2018-02-08 MED ORDER — LITHIUM CITRATE 300 MG/5 ML PO SYRP: 150.0000 mg | Freq: Two times a day (BID) | ORAL | Status: DC

## 2018-02-08 MED ORDER — DEXTROSE-NACL 5-0.45 % IV SOLN: INTRAVENOUS | Status: DC

## 2018-02-08 MED ORDER — ONDANSETRON HCL 4 MG/2ML IJ SOLN
4.0000 mg | Freq: Once | INTRAMUSCULAR | Status: AC
  Administered 2018-02-08: 4 mg via INTRAVENOUS
  Filled 2018-02-08: qty 2

## 2018-02-08 MED ORDER — DEXTROSE 50 % IV SOLN: 50.0000 mL | INTRAVENOUS | Status: DC | PRN

## 2018-02-08 MED ORDER — ONDANSETRON HCL 4 MG/2ML IJ SOLN: 4.0000 mg | Freq: Four times a day (QID) | INTRAMUSCULAR | Status: DC | PRN

## 2018-02-08 MED ORDER — POTASSIUM CHLORIDE 10 MEQ/100ML IV SOLN
10.0000 meq | INTRAVENOUS | Status: AC
  Administered 2018-02-08 (×3): 10 meq via INTRAVENOUS
  Filled 2018-02-08 (×3): qty 100

## 2018-02-08 MED ORDER — HYDRALAZINE HCL 20 MG/ML IJ SOLN: 5.0000 mg | INTRAMUSCULAR | Status: DC | PRN

## 2018-02-08 MED ORDER — ONDANSETRON HCL 4 MG PO TABS: 4.0000 mg | ORAL_TABLET | Freq: Four times a day (QID) | ORAL | Status: DC | PRN

## 2018-02-08 MED ORDER — BUSPIRONE HCL 15 MG PO TABS: 15.0000 mg | ORAL_TABLET | Freq: Three times a day (TID) | ORAL | Status: DC | PRN

## 2018-02-08 MED ORDER — INSULIN REGULAR(HUMAN) IN NACL 100-0.9 UT/100ML-% IV SOLN
INTRAVENOUS | Status: DC
  Administered 2018-02-08: 5.4 [IU]/h via INTRAVENOUS
  Filled 2018-02-08: qty 100

## 2018-02-08 MED ORDER — POTASSIUM CHLORIDE CRYS ER 20 MEQ PO TBCR: 60.0000 meq | EXTENDED_RELEASE_TABLET | Freq: Once | ORAL | Status: DC

## 2018-02-08 NOTE — ED Notes (Signed)
Phlebotomy/IV team at bedside.  

## 2018-02-08 NOTE — ED Notes (Signed)
Niu, MD at bedside. °

## 2018-02-08 NOTE — ED Provider Notes (Signed)
[facility] EMERGENCY DEPARTMENT Provider Note   CSN: [id number] Arrival date & time: 02/08/18  1635     History   Chief Complaint Chief Complaint  Patient presents with  . Chest Pain  . Foot Pain    HPI Jordan Sparks is a 42 y.o. male.  Patient presents with 4-day history of chest tightness and vomiting.  Patient states multiple episodes of vomiting in the past several days.  Possible hematemesis at onset.  Chest pain is in the mid chest described as a tightness.  Pain does not radiate.  It is not exertional.  Is constant in nature.  Patient denies any back pain.  No history of heart failure or other cardiac problems although he does have sleep apnea.  He denies any discrete fevers.  No nasal congestion.  He has had a sore throat that he states is from vomiting.  He reports some pain in the bottom of his right foot where he has deep fissures in the sole of his foot.  He denies any pain in the calves or thighs however does report mild swelling of the bilateral calves. No known sick contacts. The onset of this condition was acute. The course is constant. Aggravating factors: none. Alleviating factors: none.       Past Medical History:  Diagnosis Date  . Anxiety   . Depression   . Hypertension   . Manic disorder (HCC)     There are no active problems to display for this patient.   History reviewed. No pertinent surgical history.      Home Medications    Prior to Admission medications   Not on File    Family History No family history on file.  Social History Social History   Tobacco Use  . Smoking status: Current Every Day Smoker    Packs/day: 0.25    Types: Cigarettes  . Smokeless tobacco: Never Used  Substance Use Topics  . Alcohol use: No  . Drug use: No     Allergies   Patient has no known allergies.   Review of Systems Review of Systems  Constitutional: Negative for diaphoresis and fever.  HENT: Negative for  rhinorrhea and sore throat.   Eyes: Negative for redness.  Respiratory: Positive for chest tightness. Negative for cough and shortness of breath.   Cardiovascular: Positive for leg swelling. Negative for chest pain and palpitations.  Gastrointestinal: Positive for nausea. Negative for abdominal pain, diarrhea and vomiting.  Genitourinary: Negative for dysuria.  Musculoskeletal: Positive for myalgias. Negative for back pain and neck pain.  Skin: Negative for rash.  Neurological: Negative for syncope, light-headedness and headaches.  Psychiatric/Behavioral: The patient is not nervous/anxious.      Physical Exam Updated Vital Signs BP (!) 132/98 (BP Location: Right Arm)   Pulse (!) 124   Temp 99.6 F (37.6 C) (Oral)   Resp 18   Ht 5\' 10"  (1.778 m)   Wt 124.7 kg   SpO2 96%   BMI 39.46 kg/m   Physical Exam  Constitutional: He appears well-developed and well-nourished.  HENT:  Head: Normocephalic and atraumatic.  Eyes: Conjunctivae are normal. Right eye exhibits no discharge. Left eye exhibits no discharge.  Neck: Normal range of motion. Neck supple.  Cardiovascular: Regular rhythm and normal heart sounds. Tachycardia present.  Pulmonary/Chest: Effort normal and breath sounds normal. He has no decreased breath sounds. He has no wheezes. He has no rales.  Abdominal: Soft. There is no tenderness.  Neurological: He is alert.  Skin: Skin is warm and dry.  Fissures noted in the right sole at the heel.  No signs of surrounding infection.  Area is moderately tender.  Psychiatric: He has a normal mood and affect.  Nursing note and vitals reviewed.    ED Treatments / Results  Labs (all labs ordered are listed, but only abnormal results are displayed) Labs Reviewed  CBC WITH DIFFERENTIAL/PLATELET - Abnormal; Notable for the following components:      Result Value   WBC 11.4 (*)    MCV 78.5 (*)    Neutro Abs 9.3 (*)    All other components within normal limits  COMPREHENSIVE  METABOLIC PANEL - Abnormal; Notable for the following components:   Sodium 130 (*)    Potassium 3.0 (*)    Chloride 92 (*)    Glucose, Bld 663 (*)    Creatinine, Ser 1.25 (*)    All other components within normal limits  CBG MONITORING, ED - Abnormal; Notable for the following components:   Glucose-Capillary >600 (*)    All other components within normal limits  GROUP A STREP BY PCR  BRAIN NATRIURETIC PEPTIDE  INFLUENZA PANEL BY PCR (TYPE A & B)  LIPASE, BLOOD  I-STAT TROPONIN, ED    EKG EKG Interpretation  Date/Time:  Friday February 08 2018 16:58:51 EST Ventricular Rate:  115 PR Interval:  168 QRS Duration: 80 QT Interval:  352 QTC Calculation: 486 R Axis:   83 Text Interpretation:  Sinus tachycardia Otherwise normal ECG Confirmed by Marily Memos [phone]) on 02/08/2018 5:20:23 PM   Radiology Dg Chest 2 View  Result Date: 02/08/2018 CLINICAL DATA:  Short of breath, chest pain EXAM: CHEST - 2 VIEW COMPARISON:  06/30/2003 FINDINGS: The heart size and mediastinal contours are within normal limits. Both lungs are clear. The visualized skeletal structures are unremarkable. IMPRESSION: No active cardiopulmonary disease. Electronically Signed   By: Marlan Palau M.D.   On: 02/08/2018 18:02    Procedures Procedures (including critical care time)  Medications Ordered in ED Medications  dextrose 5 %-0.45 % sodium chloride infusion (has no administration in time range)  insulin regular, human (MYXREDLIN) 100 units/ 100 mL infusion (5.4 Units/hr Intravenous New Bag/Given 02/08/18 1906)  sodium chloride 0.9 % bolus 1,000 mL (1,000 mLs Intravenous New Bag/Given 02/08/18 1900)    And  0.9 %  sodium chloride infusion (has no administration in time range)  potassium chloride 10 mEq in 100 mL IVPB (has no administration in time range)  ondansetron (ZOFRAN) injection 4 mg (4 mg Intravenous Given 02/08/18 1729)  sodium chloride 0.9 % bolus 1,000 mL (0 mLs Intravenous Stopped 02/08/18  1800)  potassium chloride SA (K-DUR,KLOR-CON) CR tablet 40 mEq (40 mEq Oral Given 02/08/18 1903)     Initial Impression / Assessment and Plan / ED Course  I have reviewed the triage vital signs and the nursing notes.  Pertinent labs & imaging results that were available during my care of the patient were reviewed by me and considered in my medical decision making (see chart for details).     Patient seen and examined. Work-up initiated. Medications ordered. EKG reviewed with Dr. Clayborne Dana.   Vital signs reviewed and are as follows: BP (!) 132/98 (BP Location: Right Arm)   Pulse (!) 124   Temp 99.6 F (37.6 C) (Oral)   Resp 18   Ht 5\' 10"  (1.778 m)   Wt 124.7 kg   SpO2 96%   BMI 39.46 kg/m   Work-up reveals hyperglycemia  with normal anion gap.  Patient was actively vomiting in the room.  Treated with Zofran with improvement.  Patient was started on IV fluids.  Patient does not have a previous history of diabetes.  He was started on glucose stabilizer protocol.  Given potassium of 3.0 --replacement started orally and with IV potassium.  No EKG changes on arrival.  CRITICAL CARE Performed by: Renne Crigler PA-C Total critical care time: 35 minutes Critical care time was exclusive of separately billable procedures and treating other patients. Critical care was necessary to treat or prevent imminent or life-threatening deterioration. Critical care was time spent personally by me on the following activities: development of treatment plan with patient and/or surrogate as well as nursing, discussions with consultants, evaluation of patient's response to treatment, examination of patient, obtaining history from patient or surrogate, ordering and performing treatments and interventions, ordering and review of laboratory studies, ordering and review of radiographic studies, pulse oximetry and re-evaluation of patient's condition.   7:40 PM spoke with Dr. Clyde Lundborg of [facility] who will see  patient.  Final Clinical Impressions(s) / ED Diagnoses   Final diagnoses:  Hyperglycemia without ketosis  Non-intractable vomiting with nausea, unspecified vomiting type  Hypokalemia   Patient with nausea and vomiting in setting of elevated blood sugars.  No signs of ketosis.  No abdominal pain.  Will need admission for correction of blood sugar, hydration and hypokalemia.  ED Discharge Orders    None       Renne Crigler, Cordelia Poche 02/08/18 1941    Mesner, Barbara Cower, MD 02/09/18 2015

## 2018-02-08 NOTE — ED Notes (Signed)
Patient transported to X-ray 

## 2018-02-08 NOTE — H&P (Signed)
History and Physical    Jordan Sparks WUJ:[id number] DOB: [birth year] DOA: 02/08/2018  Referring MD/NP/PA:   PCP: Patient, No Pcp Per   Patient coming from:  The patient is coming from home.  At baseline, pt is independent for most of ADL.        Chief Complaint: Nausea, vomiting, chest pain  HPI: Jordan Sparks is a 42 y.o. male with medical history significant of hypertension, depression, anxiety, manic disorder, tobacco abuse, marijuana abuse, who presents with nausea, vomiting and chest pain.  Patient states that he has been having severe nausea, vomiting for more than 4 days, multiple times every day, with nonbilious vomitus.  He states that he vomited a little blood on 11/25, which did not happen again. Patient denies abdominal pain or diarrhea.  No fever or chills.  She reports acid reflux and heartburn.  He states that he states that recently he has been having increased urinary frequency, but no dysuria or burning or urination.  She also reports chest pain, which is located in the upper mid chest, intermittent, 8 out of 10 in severity, radiating to the throat, which he attributed to acid reflux.  No cough, shortness of breath.  No tenderness in calf areas.  No recent long distant traveling.  He also reports that he is taking Lasix for chronic ankle edema, sometimes he has bilateral foot pain due to swelling.  Denies history of congestive heart failure.  Denies history of diabetes.  ED Course: pt was found to have elevated blood sugar 663, bicarbonate 25, anion gap 13, WBC 11.4, lipase 30, BNP 25, potassium 3.0, creatinine 1.25, GFR>60, negative strep throat screen, negative flu PCR, negative troponin, temperature 99.6, tachycardia, oxygen saturation 97% on room air, chest x-ray negative.  Patient is placed on telemetry bed for observation.  Review of Systems:   General: no fevers, chills, no body weight gain, has poor appetite, has fatigue HEENT: no blurry vision,  hearing changes or sore throat Respiratory: no dyspnea, coughing, wheezing CV: has chest pain, no palpitations GI: has nausea, vomiting, no abdominal pain, diarrhea, constipation GU: no dysuria, burning on urination, increased urinary frequency, hematuria  Ext: no leg edema Neuro: no unilateral weakness, numbness, or tingling, no vision change or hearing loss Skin: no rash, no skin tear. MSK: No muscle spasm, no deformity, no limitation of range of movement in spin Heme: No easy bruising.  Travel history: No recent long distant travel.  Allergy: No Known Allergies  Past Medical History:  Diagnosis Date  . Anxiety   . Depression   . Hypertension   . Manic disorder [facility]     Past Surgical History:  Procedure Laterality Date  . Dental procedure      Social History:  reports that he has been smoking cigarettes. He has been smoking about 0.25 packs per day. He has never used smokeless tobacco. He reports that he has current or past drug history. Drug: Marijuana. He reports that he does not drink alcohol.  Family History:  Family History  Problem Relation Age of Onset  . Seizures Brother      Prior to Admission medications   Not on File    Physical Exam: Vitals:   02/08/18 1729 02/08/18 2039 02/08/18 2111 02/08/18 2143  BP: (!) 143/84 131/90  (!) 147/96  Pulse: (!) 122 (!) 106  96  Resp: 20 17  16   Temp:  98.2 F (36.8 C)  98.4 F (36.9 C)  TempSrc:  Oral  Oral  SpO2:  97% 97%  100%  Weight:   124 kg   Height:   5\' 10"  (1.778 m)    General: Not in acute distress HEENT:       Eyes: PERRL, EOMI, no scleral icterus.       ENT: No discharge from the ears and nose, no pharynx injection, no tonsillar enlargement.        Neck: No JVD, no bruit, no mass felt. Heme: No neck lymph node enlargement. Cardiac: S1/S2, RRR, No murmurs, No gallops or rubs. Respiratory:  No rales, wheezing, rhonchi or rubs. GI: Soft, nondistended, nontender, no rebound pain, no organomegaly, BS  present. GU: No hematuria Ext: No pitting leg edema bilaterally. 2+DP/PT pulse bilaterally. Musculoskeletal: No joint deformities, No joint redness or warmth, no limitation of ROM in spin. Skin: No rashes.  Neuro: Alert, oriented X3, cranial nerves II-XII grossly intact, moves all extremities normally.  Psych: Patient is not psychotic, no suicidal or hemocidal ideation.  Labs on Admission: I have personally reviewed following labs and imaging studies  CBC: Recent Labs  Lab 02/08/18 1710  WBC 11.4*  NEUTROABS 9.3*  HGB 14.0  HCT 41.9  MCV 78.5*  PLT 295   Basic Metabolic Panel: Recent Labs  Lab 02/08/18 1724  NA 130*  K 3.0*  CL 92*  CO2 25  GLUCOSE 663*  BUN 11  CREATININE 1.25*  CALCIUM 9.0   GFR: Estimated Creatinine Clearance: 103.8 mL/min (A) (by C-G formula based on SCr of 1.25 mg/dL (H)). Liver Function Tests: Recent Labs  Lab 02/08/18 1724  AST 18  ALT 31  ALKPHOS 112  BILITOT 0.5  PROT 6.9  ALBUMIN 3.6   Recent Labs  Lab 02/08/18 1724  LIPASE 30   No results for input(s): AMMONIA in the last 168 hours. Coagulation Profile: No results for input(s): INR, PROTIME in the last 168 hours. Cardiac Enzymes: Recent Labs  Lab 02/08/18 2020  TROPONINI <0.03   BNP (last 3 results) No results for input(s): PROBNP in the last 8760 hours. HbA1C: No results for input(s): HGBA1C in the last 72 hours. CBG: Recent Labs  Lab 02/08/18 1849 02/08/18 2003 02/08/18 2138  GLUCAP >600* 444* 287*   Lipid Profile: No results for input(s): CHOL, HDL, LDLCALC, TRIG, CHOLHDL, LDLDIRECT in the last 72 hours. Thyroid Function Tests: No results for input(s): TSH, T4TOTAL, FREET4, T3FREE, THYROIDAB in the last 72 hours. Anemia Panel: No results for input(s): VITAMINB12, FOLATE, FERRITIN, TIBC, IRON, RETICCTPCT in the last 72 hours. Urine analysis:    Component Value Date/Time   COLORURINE YELLOW 04/24/2008 0948   APPEARANCEUR CLEAR 04/24/2008 0948   LABSPEC  1.028 04/24/2008 0948   PHURINE 6.0 04/24/2008 0948   GLUCOSEU NEGATIVE 04/24/2008 0948   HGBUR NEGATIVE 04/24/2008 0948   BILIRUBINUR NEGATIVE 04/24/2008 0948   KETONESUR NEGATIVE 04/24/2008 0948   PROTEINUR 100 (A) 04/24/2008 0948   UROBILINOGEN 0.2 04/24/2008 0948   NITRITE NEGATIVE 04/24/2008 0948   LEUKOCYTESUR NEGATIVE 04/24/2008 0948   Sepsis Labs: @LABRCNTIP (procalcitonin:4,lacticidven:4) ) Recent Results (from the past 240 hour(s))  Group A Strep by PCR     Status: None   Collection Time: 02/08/18  5:27 PM  Result Value Ref Range Status   Group A Strep by PCR NOT DETECTED NOT DETECTED Final    Comment: Performed at [facility], [address], [place], [place] [address]     Radiological Exams on Admission: Dg Chest 2 View  Result Date: 02/08/2018 CLINICAL DATA:  Short of breath,  chest pain EXAM: CHEST - 2 VIEW COMPARISON:  06/30/2003 FINDINGS: The heart size and mediastinal contours are within normal limits. Both lungs are clear. The visualized skeletal structures are unremarkable. IMPRESSION: No active cardiopulmonary disease. Electronically Signed   By: Marlan Palau M.D.   On: 02/08/2018 18:02     EKG: Independently reviewed.  Sinus rhythm, QTC 486, J-point elevation in V2-V3, mild T wave inversion in inferior leads, V5-V6.  Anteroseptal infarction pattern.   Assessment/Plan Principal Problem:   Intractable nausea and vomiting Active Problems:   Hyperglycemia   Depression   Hypertension   Anxiety   Atypical chest pain   Hypokalemia   Leukocytosis   Manic disorder (HCC)   GERD (gastroesophageal reflux disease)   Intractable nausea and vomiting: Etiology is not clear, no abdominal pain or diarrhea.  Differential diagnosis to include marijuana abuse, viral gastritis and gastroparesis.  -Placed on telemetry bed of observation -Scheduled Reglan 5 mg 3 times daily -PRN Zofran -IV fluid: 3 L normal saline bolus, followed by 125 cc/h  Hyperglycemia:  Blood sugar 663, anion gap normal, no DKA.  Likely has new onset type 2 diabetes.  Patient was initially started with insulin drip, blood sugar decreased to 400s. -Start Lantus 8 units daily -Sliding scale insulin -IV fluid as above -DC insulin drip -Check A1c -Diabetic educator consult  Atypical chest pain: Likely due to acid reflux.  EKG showed minimal T wave inversion in inferior leads and V5-V6.  Initial troponin negative. - cycle CE q6 x3 and repeat EKG in the am  - prn Nitroglycerin, Morphine, and aspirin - Risk factor stratification: will check FLP and A1C, UDS - did not order 2d echo--> please reevaluate pt in AM to decide if pt needs 2D echo. - Protonix and Mylanta for possible acid reflux  GERD: -Protonix  Psych issues: Depression, anxiety and manic disorder -Continue Wellbutrin, BuSpar, Prozac, lithium -Check lithium level  HTN: Blood pressure 143/84 -Continue home medications: Clonidine, carvedilol -Hold Lasix since patient is dehydrated secondary to hypoglycemia -IV hydralazine prn  Hypokalemia: K= 3.0  on admission. - Repleted - Check Mg level - Give 1 g of magnesium sulfate'  Leukocytosis: WBC 11.4.  No fever, likely due to stress-induced demargination. -Follow-up urinalysis and blood culture     DVT ppx: SCD Code Status: Full code Family Communication: None at bed side. Disposition Plan:  Anticipate discharge back to previous home environment Consults called: None Admission status: Obs / tele    Date of Service 02/09/2018    Lorretta Harp [facility] Pager [phone]  If 7PM-7AM, please contact night-coverage [email or web address] Password [email or web address] 02/09/2018, 1:58 AM

## 2018-02-08 NOTE — ED Triage Notes (Signed)
Pt endorses what he felt was acid reflux starting on the 25th. Pt reports N/V and unable to keep down any food or water. Pt reports he believes he threw up blood on the 25th. Pt reports current chest pain. Pt also reports bilateral feet pain beginning on the 20th, worse in right foot.

## 2018-02-08 NOTE — ED Provider Notes (Signed)
Medical screening examination/treatment/procedure(s) were conducted as a shared visit with non-physician practitioner(s) and myself.  I personally evaluated the patient during the encounter.  42 year old male here with epigastric discomfort.  Patient states that this is similar to reflux however he vomits often when his middle chest hurts to hurt.  Tried reflux medications which did not seem to help. Exam shows that when he takes big deep breaths his heart rate will go down to 101-102 and his symptoms seem to improve however at rest is 120.  His RR is mid 30s when he is speaking his oxygen dropped to 93% with a good Pleth.  Labs show hypokalemia, markedly elevated glucose and mild AKI. Would suggest initiating potassium repletion prior to insulin infusion to avoid worsening hypokalemia.   EKG Interpretation  Date/Time:  Friday February 08 2018 16:58:51 EST Ventricular Rate:  115 PR Interval:  168 QRS Duration: 80 QT Interval:  352 QTC Calculation: 486 R Axis:   83 Text Interpretation:  Sinus tachycardia Otherwise normal ECG Confirmed by Marily Memos [phone]) on 02/08/2018 5:20:23 PM     Hayes Czaja, Barbara Cower, MD 02/09/18 2015

## 2018-02-08 NOTE — ED Notes (Signed)
Pt to remain tele. Verbal order received to d/c glucostabilizer.

## 2018-02-08 NOTE — ED Notes (Signed)
CBG 444. 

## 2018-02-09 DIAGNOSIS — K219 Gastro-esophageal reflux disease without esophagitis: Secondary | ICD-10-CM | POA: Diagnosis present

## 2018-02-09 LAB — BASIC METABOLIC PANEL
ANION GAP: 11 (ref 5–15)
BUN: 5 mg/dL — ABNORMAL LOW (ref 6–20)
CO2: 23 mmol/L (ref 22–32)
Calcium: 8.2 mg/dL — ABNORMAL LOW (ref 8.9–10.3)
Chloride: 104 mmol/L (ref 98–111)
Creatinine, Ser: 1 mg/dL (ref 0.61–1.24)
GFR calc Af Amer: >60 mL/min (ref >60)
GFR calc non Af Amer: >60 mL/min (ref >60)
Glucose, Bld: 260 mg/dL — ABNORMAL HIGH (ref 70–99)
Potassium: 3.2 mmol/L — ABNORMAL LOW (ref 3.5–5.1)
Sodium: 138 mmol/L (ref 135–145)

## 2018-02-09 LAB — GLUCOSE, CAPILLARY
GLUCOSE-CAPILLARY: 221 mg/dL — AB (ref 70–99)
Glucose-Capillary: 393 mg/dL — ABNORMAL HIGH (ref 70–99)

## 2018-02-09 LAB — CBC
HCT: 37.7 % — ABNORMAL LOW (ref 39.0–52.0)
Hemoglobin: 12 g/dL — ABNORMAL LOW (ref 13.0–17.0)
MCH: 25.5 pg — ABNORMAL LOW (ref 26.0–34.0)
MCHC: 31.8 g/dL (ref 30.0–36.0)
MCV: 80 fL (ref 80.0–100.0)
PLATELETS: 269 10*3/uL (ref 150–400)
RBC: 4.71 MIL/uL (ref 4.22–5.81)
RDW: 12.1 % (ref 11.5–15.5)
WBC: 8.4 10*3/uL (ref 4.0–10.5)
nRBC: 0 % (ref 0.0–0.2)

## 2018-02-09 LAB — LIPID PANEL
CHOL/HDL RATIO: 5.2 ratio
Cholesterol: 146 mg/dL (ref 0–200)
HDL: 28 mg/dL — ABNORMAL LOW (ref >40)
LDL Cholesterol: 81 mg/dL (ref 0–99)
Triglycerides: 184 mg/dL — ABNORMAL HIGH (ref <150)
VLDL: 37 mg/dL (ref 0–40)

## 2018-02-09 LAB — MAGNESIUM: Magnesium: 1.9 mg/dL (ref 1.7–2.4)

## 2018-02-09 LAB — TROPONIN I: Troponin I: <0.03 ng/mL (ref <0.03)

## 2018-02-09 LAB — LITHIUM LEVEL: Lithium Lvl: <0.06 mmol/L — ABNORMAL LOW (ref 0.60–1.20)

## 2018-02-09 LAB — HEMOGLOBIN A1C
Hgb A1c MFr Bld: 10.6 % — ABNORMAL HIGH (ref 4.8–5.6)
MEAN PLASMA GLUCOSE: 257.52 mg/dL

## 2018-02-09 MED ORDER — ONDANSETRON HCL 4 MG PO TABS: 4.0000 mg | ORAL_TABLET | Freq: Four times a day (QID) | ORAL | 0 refills | Status: DC | PRN

## 2018-02-09 MED ORDER — ASPIRIN EC 325 MG PO TBEC
325.0000 mg | DELAYED_RELEASE_TABLET | Freq: Every day | ORAL | Status: DC
  Administered 2018-02-09: 325 mg via ORAL
  Filled 2018-02-09: qty 1

## 2018-02-09 MED ORDER — LIVING WELL WITH DIABETES BOOK
Freq: Once | Status: AC
  Administered 2018-02-09: 11:00:00
  Filled 2018-02-09: qty 1

## 2018-02-09 MED ORDER — METFORMIN HCL 500 MG PO TABS
500.0000 mg | ORAL_TABLET | Freq: Two times a day (BID) | ORAL | Status: DC
  Administered 2018-02-09: 500 mg via ORAL
  Filled 2018-02-09: qty 1

## 2018-02-09 MED ORDER — METFORMIN HCL 500 MG PO TABS: 500.0000 mg | ORAL_TABLET | Freq: Two times a day (BID) | ORAL | 2 refills | Status: DC

## 2018-02-09 MED ORDER — GLIPIZIDE ER 2.5 MG PO TB24: 2.5000 mg | ORAL_TABLET | Freq: Every day | ORAL | 2 refills | Status: DC

## 2018-02-09 MED ORDER — GLIPIZIDE ER 2.5 MG PO TB24
2.5000 mg | ORAL_TABLET | Freq: Every day | ORAL | Status: DC
  Administered 2018-02-09: 2.5 mg via ORAL
  Filled 2018-02-09: qty 1

## 2018-02-09 MED ORDER — BLOOD GLUCOSE MONITOR KIT: PACK | 0 refills | Status: DC

## 2018-02-09 MED ORDER — POTASSIUM CHLORIDE CRYS ER 20 MEQ PO TBCR
40.0000 meq | EXTENDED_RELEASE_TABLET | Freq: Once | ORAL | Status: AC
  Administered 2018-02-09: 40 meq via ORAL
  Filled 2018-02-09: qty 2

## 2018-02-09 NOTE — Progress Notes (Signed)
Pt. Arrived to unit via stretcher by ED nurse. Pt. Placed on tele. A/O x4, ad lib. CBG 237. Gave novolog 3 units. Will continue to monitor.

## 2018-02-09 NOTE — Progress Notes (Signed)
Spoke w patient at bedside as he does not have insurance. He was provided with a MATCH letter and MD asked to send Rx to [facility] per patient request. He states that he gets all his other meds through [facility]. Patient has CPAP at home. No other CM needs identified.

## 2018-02-09 NOTE — Plan of Care (Signed)
Nutrition Education Note   RD consulted for nutrition education regarding diabetes.   Lab Results  Component Value Date   HGBA1C 10.6 (H) 02/09/2018    RD provided "Nutrition and Type II Diabetes" handout from the Academy of [employer] and Dietetics. Discussed different food groups and their effects on blood sugar, emphasizing carbohydrate-containing foods. Provided list of carbohydrates and recommended serving sizes of common foods.  Discussed importance of controlled and consistent carbohydrate intake throughout the day. Provided examples of ways to balance meals/snacks and encouraged intake of high-fiber, whole grain complex carbohydrates. Teach back method used.  Expect fair compliance.  Body mass index is 39.22 kg/m. Pt meets criteria for obesity based on current BMI.  Current diet order is HH/CHO, patient is consuming approximately 100% of meals at this time. Labs and medications reviewed. No further nutrition interventions warranted at this time. RD contact information provided. If additional nutrition issues arise, please re-consult RD.  Betsey Holiday MS, RD, LDN Pager #- [phone] Office#- [phone] After Hours Pager: [phone]

## 2018-02-09 NOTE — Progress Notes (Signed)
Educated patient about the importance of checking and recording his blood sugar at home and bring it to his next medical appointment.Advised patient the he needed to call [facility] this coming Monday to set-up PCP for himself.Have patient watched Diabetes videos #501,502,503,[phone] but refused to watched videos #507,509 and 510.How to survive diabetes book given to patient.Discussed with patient the signs and symptoms of hypoglycemia and hyperglycemia,its causes and side effects and how to manage and prevent hypoglycemia.Questions answered satisfactorily at the time of discharged.

## 2018-02-09 NOTE — Progress Notes (Addendum)
Inpatient Diabetes Program Recommendations  AACE/ADA: New Consensus Statement on Inpatient Glycemic Control (2019)  Target Ranges:  Prepandial:   less than 140 mg/dL      Peak postprandial:   less than 180 mg/dL (1-2 hours)      Critically ill patients:  140 - 180 mg/dL   Results for MAKSON, FUSILLO (MRN [id number]) as of 02/09/2018 08:10  Ref. Range 02/08/2018 18:49 02/08/2018 20:03 02/08/2018 21:38 02/09/2018 07:41  Glucose-Capillary Latest Ref Range: 70 - 99 mg/dL >563 (HH) 893 (H) 734 (H) 221 (H)   Review of Glycemic Control  Diabetes history: NO Outpatient Diabetes medications: NA Current orders for Inpatient glycemic control: Lantus 8 units daily, Novolog 0-9 units TID with meals, Novolog 0-5 units QHS  Inpatient Diabetes Program Recommendations:   Oral Agents: MD may want to consider ordering oral DM medications while inpatient to determine impact on glycemic control. If so, please consider ordering Metformin 500 mg BID and Glipizide XL 2.5 mg daily.  HgbA1C: A1C 10.6% on 02/09/18 indicating an average glucose of 258 mg/dl over the past 2-3 months.  NOTE: Noted consult for Diabetes Coordinator. Diabetes Coordinator is not on campus over the weekend but available by pager from 8am to 5pm for questions or concerns. Chart reviewed. Patient being dx with new onset DM2. Per chart, patient does not have insurance. Therefore, will need affordable DM medications for outpatient DM control. A1C 10.6% on 11/130/19. MD may want to order oral DM medications while inpatient to determine impact on glycemic control. Ordered: Living Well with DM book, patient education by bedside RNs, RD consult, DM education videos.  Addendum 02/09/18@11 :30-Spoke with patient over the phone about new diabetes diagnosis.  Discussed A1C results (10.6% on 02/09/18) and explained what an A1C is and informed patient that his current A1C indicates an average glucose of 258 mg/dl over the past 2-3 months. Discussed  basic pathophysiology of DM Type 2, basic home care, importance of checking CBGs and maintaining good CBG control to prevent long-term and short-term complications. Reviewed glucose and A1C goals.  Reviewed signs and symptoms of hyperglycemia and hypoglycemia along with treatment for both.  Discussed impact of nutrition, exercise, stress, sickness, and medications on diabetes control. Patient has Living Well with diabetes booklet at bedside. Encouraged patient to read through entire book.  Discussed Reli-On products and encouraged patient to go to [facility] to get the Reli-On Prime glucometer for $9 and a box of 50 Reli-On test strips for $9. Discussed Metformin and Glipizide and explained how they work for glycemic control. Informed patient that both Metformin and Glipizide XL are on the $4 list at [facility]. Patient states that he can afford to get a Reli-On Prime glucometer, Metformin, and Glipizide.  Asked patient to check his glucose 3-4 times per day and to keep a log book of glucose readings.  Explained how the doctor he follows up with can use the log book to continue to make adjustments with DM medicaitons if needed. [facility] but does not have a PCP nor insurance. Informed patient it would be requested that Case Manager assist with arranging follow up care.  Patient verbalized understanding of information discussed and he states that he has no further questions at this time related to diabetes.   RNs to provide ongoing basic DM education at bedside with this patient and engage patient to actively check blood glucose. Spoke with Mathis Fare, RN over the phone and asked that he be  sure to continue educating patient about DM and insulin (if needed).    Thanks, Orlando Penner, RN, MSN, CDE Diabetes Coordinator Inpatient Diabetes Program [phone] (Team Pager from 8am to 5pm)

## 2018-02-09 NOTE — Discharge Summary (Signed)
Physician Discharge Summary  Jordan Sparks [id number] DOB: [birth year] DOA: 02/08/2018  PCP: Patient, No Pcp Per  Admit date: 02/08/2018  Discharge date: 02/09/2018  Admitted From:Home  Disposition:  Home  Recommendations for Outpatient Follow-up:  1. Follow up with PCP in 1-2 weeks with new list to be given prior to discharge 2. Continue to monitor fasting blood glucose with new glucometer kit every day and log 3. Continue on metformin and glipizide as prescribed 4. Follow diabetic diet  Home Health: None  Equipment/Devices: None  Discharge Condition: Stable  CODE STATUS: Full  Diet recommendation: Heart Healthy/carb modified  Brief/Interim Summary: HPI per Dr. Blaine Hamper: Jordan Sparks is a 42 y.o. male with medical history significant of hypertension, depression, anxiety, manic disorder, tobacco abuse, marijuana abuse, who presents with nausea, vomiting and chest pain.  Patient states that he has been having severe nausea, vomiting for more than 4 days, multiple times every day, with nonbilious vomitus.  He states that he vomited a little blood on 11/25, which did not happen again. Patient denies abdominal pain or diarrhea.  No fever or chills.  She reports acid reflux and heartburn.  He states that he states that recently he has been having increased urinary frequency, but no dysuria or burning or urination.  She also reports chest pain, which is located in the upper mid chest, intermittent, 8 out of 10 in severity, radiating to the throat, which he attributed to acid reflux.  No cough, shortness of breath.  No tenderness in calf areas.  No recent long distant traveling.  He also reports that he is taking Lasix for chronic ankle edema, sometimes he has bilateral foot pain due to swelling.  Denies history of congestive heart failure.  Denies history of diabetes.  Patient was admitted with blood glucose level 663 and was noted to have a hemoglobin A1c greater than  10% and has been newly diagnosed with type 2 diabetes.  Per diabetes coordinator, started patient on metformin 500 mg twice daily as well as glipizide XL 2.5 mg daily and patient has been tolerating this well with no further symptomatic complaints of nausea or vomiting or chest pain.  Troponins have remained flat with no signs of ACS otherwise noted.  He has also been given a prescription for a glucometer with strips and lancets as well as education on diet control.  He currently has a PCP, but is switching and will find someone to follow-up with him the next 1 to 2 weeks.  No other acute events noted during this brief admission.  Discharge Diagnoses:  Principal Problem:   Intractable nausea and vomiting Active Problems:   Hyperglycemia   Depression   Hypertension   Anxiety   Atypical chest pain   Hypokalemia   Leukocytosis   Manic disorder (HCC)   GERD (gastroesophageal reflux disease)  Principal discharge diagnosis: New onset type 2 diabetes with hyperglycemia.  Discharge Instructions  Discharge Instructions    Diet - low sodium heart healthy   Complete by:  As directed    Increase activity slowly   Complete by:  As directed      Allergies as of 02/09/2018   No Known Allergies     Medication List    TAKE these medications   blood glucose meter kit and supplies Kit Dispense based on patient and insurance preference. Use up to four times daily as directed. (FOR ICD-9 250.00, 250.01).   busPIRone 15 MG tablet Commonly known as:  BUSPAR Take 15 mg by  mouth 3 (three) times daily as needed (anxiety).   carvedilol 6.25 MG tablet Commonly known as:  COREG Take 6.25 mg by mouth 2 (two) times daily.   CLONIDINE HCL PO Take 0.1-0.2 mg by mouth See admin instructions. Take one tablet by mouth every morning and two tablets at night   FLUoxetine 20 MG capsule Commonly known as:  PROZAC Take 20 mg by mouth daily.   furosemide 20 MG tablet Commonly known as:  LASIX Take 20 mg  by mouth daily.   glipiZIDE 2.5 MG 24 hr tablet Commonly known as:  GLUCOTROL XL Take 1 tablet (2.5 mg total) by mouth daily with breakfast. Start taking on:  02/10/2018   ibuprofen 200 MG tablet Commonly known as:  ADVIL,MOTRIN Take 400 mg by mouth every 6 (six) hours as needed for headache (pain).   LITHIUM PO Take 300 mg by mouth 2 (two) times daily.   loratadine 10 MG tablet Commonly known as:  CLARITIN Take 10 mg by mouth at bedtime.   metFORMIN 500 MG tablet Commonly known as:  GLUCOPHAGE Take 1 tablet (500 mg total) by mouth 2 (two) times daily with a meal.   multivitamin with minerals Tabs tablet Take 1 tablet by mouth daily as needed (energy).   ondansetron 4 MG tablet Commonly known as:  ZOFRAN Take 1 tablet (4 mg total) by mouth every 6 (six) hours as needed for nausea.   Vitamin D 50 MCG (2000 UT) Caps Take 2,000 Units by mouth daily.   WELLBUTRIN PO Take 150 mg by mouth 2 (two) times daily.      Follow-up Information    pcp Follow up in 1 week(s).   Why:  please provide list of pcps to follow up with in 1-2 weeks prior to DC.         No Known Allergies  Consultations:  None   Procedures/Studies: Dg Chest 2 View  Result Date: 02/08/2018 CLINICAL DATA:  Short of breath, chest pain EXAM: CHEST - 2 VIEW COMPARISON:  06/30/2003 FINDINGS: The heart size and mediastinal contours are within normal limits. Both lungs are clear. The visualized skeletal structures are unremarkable. IMPRESSION: No active cardiopulmonary disease. Electronically Signed   By: Franchot Gallo M.D.   On: 02/08/2018 18:02     Discharge Exam: Vitals:   02/09/18 0517 02/09/18 0910  BP: (!) 119/95 (!) 135/94  Pulse: 74 (!) 46  Resp: 19 18  Temp: 97.9 F (36.6 C)   SpO2: 97% 97%   Vitals:   02/08/18 2111 02/08/18 2143 02/09/18 0517 02/09/18 0910  BP:  (!) 147/96 (!) 119/95 (!) 135/94  Pulse:  96 74 (!) 46  Resp:  _0 Temp:  98.4 F (36.9 C) 97.9 F (36.6 C)    TempSrc:  Oral Oral   SpO2:  100% 97% 97%  Weight: 124 kg     Height: _1  (1.778 m)       General: Pt is alert, awake, not in acute distress Cardiovascular: RRR, S1/S2 +, no rubs, no gallops Respiratory: CTA bilaterally, no wheezing, no rhonchi Abdominal: Soft, NT, ND, bowel sounds + Extremities: no edema, no cyanosis    The results of significant diagnostics from this hospitalization (including imaging, microbiology, ancillary and laboratory) are listed below for reference.     Microbiology: Recent Results (from the past 240 hour(s))  Group A Strep by PCR     Status: None   Collection Time: 02/08/18  5:27 PM  Result Value Ref  Range Status   Group A Strep by PCR NOT DETECTED NOT DETECTED Final    Comment: Performed at [facility], [place] [address] [place], [place] [address]  Culture, blood (Routine X 2) w Reflex to ID Panel     Status: None (Preliminary result)   Collection Time: 02/08/18  8:15 PM  Result Value Ref Range Status   Specimen Description BLOOD LEFT WRIST  Final   Special Requests   Final    BOTTLES DRAWN AEROBIC AND ANAEROBIC Blood Culture adequate volume   Culture   Final    NO GROWTH < 12 HOURS Performed at [facility], [place] [address], [place], [place] [address]    Report Status PENDING  Incomplete  Culture, blood (Routine X 2) w Reflex to ID Panel     Status: None (Preliminary result)   Collection Time: 02/08/18  8:19 PM  Result Value Ref Range Status   Specimen Description BLOOD RIGHT ANTECUBITAL  Final   Special Requests   Final    BOTTLES DRAWN AEROBIC AND ANAEROBIC Blood Culture adequate volume   Culture   Final    NO GROWTH < 12 HOURS Performed at [facility], [place] [address], [place], [place] [address]    Report Status PENDING  Incomplete     Labs: BNP (last 3 results) Recent Labs    02/08/18 1710  BNP 73.4   Basic Metabolic Panel: Recent Labs  Lab 02/08/18 1724 02/09/18 0221 02/09/18 0602  NA 130*  --  138  K  3.0*  --  3.2*  CL 92*  --  104  CO2 25  --  23  GLUCOSE 663*  --  260*  BUN 11  --  5*  CREATININE 1.25*  --  1.00  CALCIUM 9.0  --  8.2*  MG  --  1.9  --    Liver Function Tests: Recent Labs  Lab 02/08/18 1724  AST 18  ALT 31  ALKPHOS 112  BILITOT 0.5  PROT 6.9  ALBUMIN 3.6   Recent Labs  Lab 02/08/18 1724  LIPASE 30   No results for input(s): AMMONIA in the last 168 hours. CBC: Recent Labs  Lab 02/08/18 1710 02/09/18 0602  WBC 11.4* 8.4  NEUTROABS 9.3*  --   HGB 14.0 12.0*  HCT 41.9 37.7*  MCV 78.5* 80.0  PLT 295 269   Cardiac Enzymes: Recent Labs  Lab 02/08/18 2020 02/09/18 0221 02/09/18 0602  TROPONINI <0.03 <0.03 <0.03   BNP: Invalid input(s): POCBNP CBG: Recent Labs  Lab 02/08/18 1849 02/08/18 2003 02/08/18 2138 02/09/18 0741 02/09/18 1145  GLUCAP >600* 444* 287* 221* 393*   D-Dimer No results for input(s): DDIMER in the last 72 hours. Hgb A1c Recent Labs    02/09/18 0221  HGBA1C 10.6*   Lipid Profile Recent Labs    02/09/18 0221  CHOL 146  HDL 28*  LDLCALC 81  TRIG 184*  CHOLHDL 5.2   Thyroid function studies No results for input(s): TSH, T4TOTAL, T3FREE, THYROIDAB in the last 72 hours.  Invalid input(s): FREET3 Anemia work up No results for input(s): VITAMINB12, FOLATE, FERRITIN, TIBC, IRON, RETICCTPCT in the last 72 hours. Urinalysis    Component Value Date/Time   COLORURINE YELLOW 04/24/2008 [place] 04/24/2008 0948   LABSPEC 1.028 04/24/2008 0948   PHURINE 6.0 04/24/2008 [place] 04/24/2008 [place] NEGATIVE 04/24/2008 [place] NEGATIVE 04/24/2008 2876   [place] Stain  NEGATIVE 04/24/2008 0948   PROTEINUR 100 (A) 04/24/2008 0948   UROBILINOGEN 0.2 04/24/2008 0948   NITRITE NEGATIVE 04/24/2008 0948   LEUKOCYTESUR NEGATIVE 04/24/2008 0948   Sepsis Labs Invalid input(s): PROCALCITONIN,  WBC,  LACTICIDVEN Microbiology Recent Results (from the past 240 hour(s))  Group  A Strep by PCR     Status: None   Collection Time: 02/08/18  5:27 PM  Result Value Ref Range Status   Group A Strep by PCR NOT DETECTED NOT DETECTED Final    Comment: Performed at [facility], [address], [place], [place] [address]  Culture, blood (Routine X 2) w Reflex to ID Panel     Status: None (Preliminary result)   Collection Time: 02/08/18  8:15 PM  Result Value Ref Range Status   Specimen Description BLOOD LEFT WRIST  Final   Special Requests   Final    BOTTLES DRAWN AEROBIC AND ANAEROBIC Blood Culture adequate volume   Culture   Final    NO GROWTH < 12 HOURS Performed at [facility], [place] [address] [place], [place] [address]    Report Status PENDING  Incomplete  Culture, blood (Routine X 2) w Reflex to ID Panel     Status: None (Preliminary result)   Collection Time: 02/08/18  8:19 PM  Result Value Ref Range Status   Specimen Description BLOOD RIGHT ANTECUBITAL  Final   Special Requests   Final    BOTTLES DRAWN AEROBIC AND ANAEROBIC Blood Culture adequate volume   Culture   Final    NO GROWTH < 12 HOURS Performed at [facility], [place] [address] [place],  [address]    Report Status PENDING  Incomplete     Time coordinating discharge: 35 minutes  SIGNED:   Rodena Goldmann, DO [facility] 02/09/2018, 12:05 PM Pager [phone]  If 7PM-7AM, please contact night-coverage [email or web address] Password [email or web address]

## 2018-02-09 NOTE — Progress Notes (Signed)
CPAP set up for patient; pt states will placed on once ready for bed. RN at bedside given oral meds. Settings adjusted per patient home regimen. States he wears auto titrate at min 4- max 14cmh2o. humidity provided.

## 2018-02-13 LAB — CULTURE, BLOOD (ROUTINE X 2)
CULTURE: NO GROWTH
Culture: NO GROWTH
Special Requests: ADEQUATE
Special Requests: ADEQUATE

## 2018-02-19 LAB — HIV ANTIBODY (ROUTINE TESTING W REFLEX): HIV Screen 4th Generation wRfx: NONREACTIVE

## 2020-01-23 ENCOUNTER — Other Ambulatory Visit: Payer: Self-pay

## 2020-01-23 ENCOUNTER — Ambulatory Visit (HOSPITAL_COMMUNITY)
Admission: EM | Admit: 2020-01-23 | Discharge: 2020-01-23 | Disposition: A | Discharge status: Home / Self Care | Payer: Self-pay | Attending: Family Medicine | Admitting: Family Medicine

## 2020-01-23 ENCOUNTER — Encounter (HOSPITAL_COMMUNITY): Payer: Self-pay | Admitting: Emergency Medicine

## 2020-01-23 ENCOUNTER — Ambulatory Visit (INDEPENDENT_AMBULATORY_CARE_PROVIDER_SITE_OTHER): Payer: Self-pay

## 2020-01-23 DIAGNOSIS — R051 Acute cough: Secondary | ICD-10-CM | POA: Insufficient documentation

## 2020-01-23 DIAGNOSIS — R112 Nausea with vomiting, unspecified: Secondary | ICD-10-CM | POA: Insufficient documentation

## 2020-01-23 DIAGNOSIS — R0602 Shortness of breath: Secondary | ICD-10-CM

## 2020-01-23 DIAGNOSIS — J029 Acute pharyngitis, unspecified: Secondary | ICD-10-CM | POA: Insufficient documentation

## 2020-01-23 DIAGNOSIS — K219 Gastro-esophageal reflux disease without esophagitis: Secondary | ICD-10-CM | POA: Insufficient documentation

## 2020-01-23 DIAGNOSIS — R0789 Other chest pain: Secondary | ICD-10-CM | POA: Insufficient documentation

## 2020-01-23 DIAGNOSIS — R42 Dizziness and giddiness: Secondary | ICD-10-CM | POA: Insufficient documentation

## 2020-01-23 DIAGNOSIS — Z20822 Contact with and (suspected) exposure to covid-19: Secondary | ICD-10-CM | POA: Insufficient documentation

## 2020-01-23 DIAGNOSIS — I10 Essential (primary) hypertension: Secondary | ICD-10-CM | POA: Insufficient documentation

## 2020-01-23 DIAGNOSIS — F17210 Nicotine dependence, cigarettes, uncomplicated: Secondary | ICD-10-CM | POA: Insufficient documentation

## 2020-01-23 DIAGNOSIS — J189 Pneumonia, unspecified organism: Secondary | ICD-10-CM | POA: Insufficient documentation

## 2020-01-23 DIAGNOSIS — E119 Type 2 diabetes mellitus without complications: Secondary | ICD-10-CM | POA: Insufficient documentation

## 2020-01-23 DIAGNOSIS — R1110 Vomiting, unspecified: Secondary | ICD-10-CM

## 2020-01-23 DIAGNOSIS — R079 Chest pain, unspecified: Secondary | ICD-10-CM

## 2020-01-23 DIAGNOSIS — Z7984 Long term (current) use of oral hypoglycemic drugs: Secondary | ICD-10-CM | POA: Insufficient documentation

## 2020-01-23 DIAGNOSIS — Z79899 Other long term (current) drug therapy: Secondary | ICD-10-CM | POA: Insufficient documentation

## 2020-01-23 DIAGNOSIS — R059 Cough, unspecified: Secondary | ICD-10-CM

## 2020-01-23 LAB — SARS CORONAVIRUS 2 (TAT 6-24 HRS): SARS Coronavirus 2: NEGATIVE

## 2020-01-23 LAB — CBG MONITORING, ED: Glucose-Capillary: 111 mg/dL — ABNORMAL HIGH (ref 70–99)

## 2020-01-23 MED ORDER — ONDANSETRON 4 MG PO TBDP
8.0000 mg | ORAL_TABLET | Freq: Once | ORAL | Status: AC
  Administered 2020-01-23: 8 mg via ORAL

## 2020-01-23 MED ORDER — CEFTRIAXONE SODIUM 1 G IJ SOLR
INTRAMUSCULAR | Status: AC
  Filled 2020-01-23: qty 10

## 2020-01-23 MED ORDER — ONDANSETRON 4 MG PO TBDP
4.0000 mg | ORAL_TABLET | Freq: Three times a day (TID) | ORAL | 0 refills | Status: DC | PRN
Start: 2020-01-23 — End: 2021-05-04

## 2020-01-23 MED ORDER — ONDANSETRON 4 MG PO TBDP
ORAL_TABLET | ORAL | Status: AC
  Filled 2020-01-23: qty 2

## 2020-01-23 MED ORDER — AMOXICILLIN-POT CLAVULANATE 875-125 MG PO TABS: 1.0000 | ORAL_TABLET | Freq: Two times a day (BID) | ORAL | 0 refills | Status: AC

## 2020-01-23 MED ORDER — BENZONATATE 200 MG PO CAPS: 200.0000 mg | ORAL_CAPSULE | Freq: Three times a day (TID) | ORAL | 0 refills | Status: AC | PRN

## 2020-01-23 MED ORDER — GUAIFENESIN ER 600 MG PO TB12: 600.0000 mg | ORAL_TABLET | Freq: Two times a day (BID) | ORAL | 0 refills | Status: DC

## 2020-01-23 MED ORDER — CEFTRIAXONE SODIUM 1 G IJ SOLR
1.0000 g | Freq: Once | INTRAMUSCULAR | Status: AC
  Administered 2020-01-23: 1 g via INTRAMUSCULAR

## 2020-01-23 MED ORDER — AZITHROMYCIN 250 MG PO TABS: 250.0000 mg | ORAL_TABLET | Freq: Every day | ORAL | 0 refills | Status: DC

## 2020-01-23 MED ORDER — ONDANSETRON 4 MG PO TBDP: 4.0000 mg | ORAL_TABLET | Freq: Once | ORAL | Status: DC

## 2020-01-23 NOTE — ED Notes (Signed)
Provider at bedside

## 2020-01-23 NOTE — Discharge Instructions (Signed)
X-ray with area concerning for pneumonia Begin Augmentin twice daily for 1 week Azithromycin-2 tabs today, 1 tab for the following 4 days Tessalon for cough Mucinex twice daily for congestion Rest and fluids Tylenol and ibuprofen for chest discomfort Follow up for repeat chest xray here or with primary care in 4-6 weeks  If you have any worsening dizziness, lightheadedness, chest pain, shortness of breath please go to the emergency room immediately

## 2020-01-23 NOTE — ED Triage Notes (Signed)
Pt c/o shortness of breath, chest pain on the right side, emesis, and sore throat x 2 days. Pt states he had called EMS today because he felt dizzy and nauseous and like he was going to pass out. He states his BS was 120 per EMS around 2:30pm. He states that he feels very fatigued. He states they told him to go to the hospital but he wanted to wait until his wife got home.

## 2020-01-23 NOTE — ED Provider Notes (Signed)
[place]    CSN: [id number] Arrival date & time: 01/23/20  1444      History   Chief Complaint Chief Complaint  Patient presents with  . Shortness of Breath  . Chest Pain  . Emesis  . Sore Throat    HPI Najae Shabazz-McGregor is a 44 y.o. male history of hypertension, DM type II, tobacco use, GERD, presenting today for evaluation of chills body aches, URI symptoms and chest pain.  Patient reports that over the past week he has had cough and congestion.  Is also had intermittent nausea with more recent vomiting.  Over the past 4 days he has become increasingly dizzy and lightheaded especially with changing positions.  He does feel he has been able to tolerate oral intake at times.  Reports a racing sensation in the central part of his chest which comes and goes and been intermittent sharp pain in his right lower rib cage which she reports is reproducible.  Denies any significant shortness of breath.  Is been very fatigued.  Family members with similar URI symptoms.  Has not taken his blood pressure medicines today.  HPI  Past Medical History:  Diagnosis Date  . Anxiety   . Depression   . Hypertension   . Manic disorder [facility])     Patient Active Problem List   Diagnosis Date Noted  . GERD (gastroesophageal reflux disease) 02/09/2018  . Hyperglycemia 02/08/2018  . Atypical chest pain 02/08/2018  . Hypokalemia 02/08/2018  . Leukocytosis 02/08/2018  . Intractable nausea and vomiting 02/08/2018  . Depression   . Hypertension   . Anxiety   . Hyperglycemia without ketosis   . Manic disorder [facility])     Past Surgical History:  Procedure Laterality Date  . Dental procedure         Home Medications    Prior to Admission medications   Medication Sig Start Date End Date Taking? Authorizing Provider  amoxicillin-clavulanate (AUGMENTIN) 875-125 MG tablet Take 1 tablet by mouth every 12 (twelve) hours for 7 days. 01/23/20 01/30/20  Hayli Milligan C, PA-C    azithromycin (ZITHROMAX) 250 MG tablet Take 1 tablet (250 mg total) by mouth daily. Take first 2 tablets together, then 1 every day until finished. 01/23/20   Kalyan Barabas C, PA-C  benzonatate (TESSALON) 200 MG capsule Take 1 capsule (200 mg total) by mouth 3 (three) times daily as needed for up to 7 days for cough. 01/23/20 01/30/20  Marjan Rosman C, PA-C  blood glucose meter kit and supplies KIT Dispense based on patient and insurance preference. Use up to four times daily as directed. (FOR ICD-9 250.00, 250.01). 02/09/18   Manuella Ghazi, Pratik D, DO  buPROPion HCl (WELLBUTRIN PO) Take 150 mg by mouth 2 (two) times daily.     [provider]  busPIRone (BUSPAR) 15 MG tablet Take 15 mg by mouth 3 (three) times daily as needed (anxiety).    [provider]  carvedilol (COREG) 6.25 MG tablet Take 6.25 mg by mouth 2 (two) times daily.     [provider]  Cholecalciferol (VITAMIN D) 50 MCG (2000 UT) CAPS Take 2,000 Units by mouth daily.    [provider]  CLONIDINE HCL PO Take 0.1-0.2 mg by mouth See admin instructions. Take one tablet by mouth every morning and two tablets at night     [provider]  FLUoxetine (PROZAC) 20 MG capsule Take 20 mg by mouth daily.    [provider]  furosemide (LASIX)  20 MG tablet Take 20 mg by mouth daily.    [provider]  glipiZIDE (GLUCOTROL XL) 2.5 MG 24 hr tablet Take 1 tablet (2.5 mg total) by mouth daily with breakfast. 02/10/18 03/12/18  Manuella Ghazi, Pratik D, DO  guaiFENesin (MUCINEX) 600 MG 12 hr tablet Take 1 tablet (600 mg total) by mouth 2 (two) times daily. 01/23/20   Niklas Chretien C, PA-C  ibuprofen (ADVIL,MOTRIN) 200 MG tablet Take 400 mg by mouth every 6 (six) hours as needed for headache (pain).    [provider]  LITHIUM PO Take 300 mg by mouth 2 (two) times daily.     [provider]  loratadine (CLARITIN) 10 MG tablet Take 10 mg by mouth at bedtime.    [provider]  metFORMIN (GLUCOPHAGE) 500 MG tablet Take 1 tablet (500 mg total) by mouth 2 (two) times daily with a meal. 02/09/18 03/11/18  Manuella Ghazi, Pratik D, DO  Multiple Vitamin (MULTIVITAMIN WITH MINERALS) TABS tablet Take 1 tablet by mouth daily as needed (energy).    [provider]  ondansetron (ZOFRAN ODT) 4 MG disintegrating tablet Take 1-2 tablets (4-8 mg total) by mouth every 8 (eight) hours as needed for nausea or vomiting. 01/23/20   Felicia Bloomquist C, PA-C  ondansetron (ZOFRAN) 4 MG tablet Take 1 tablet (4 mg total) by mouth every 6 (six) hours as needed for nausea. 02/09/18   Heath Lark D, DO    Family History Family History  Problem Relation Age of Onset  . Seizures Brother     Social History Social History   Tobacco Use  . Smoking status: Current Every Day Smoker    Packs/day: 0.25    Types: Cigarettes  . Smokeless tobacco: Never Used  Substance Use Topics  . Alcohol use: No  . Drug use: Yes    Types: Marijuana     Allergies   Patient has no known allergies.   Review of Systems Review of Systems  Constitutional: Positive for chills, fatigue and fever. Negative for activity change and appetite change.  HENT: Positive for congestion, rhinorrhea and sore throat. Negative for ear pain, sinus pressure and trouble swallowing.   Eyes: Negative for discharge and redness.  Respiratory: Positive for cough. Negative for chest tightness and shortness of breath.   Cardiovascular: Positive for chest pain and palpitations. Negative for leg swelling.  Gastrointestinal: Positive for nausea and vomiting. Negative for abdominal pain and diarrhea.  Musculoskeletal: Negative for myalgias.  Skin: Negative for rash.  Neurological: Positive for dizziness and light-headedness. Negative for headaches.     Physical Exam Triage Vital Signs ED Triage Vitals  Enc Vitals Group     BP 01/23/20 1513 (!) 191/90     Pulse Rate 01/23/20 1513 (!) 114     Resp 01/23/20 1513  (!) 24     Temp 01/23/20 1513 100 F (37.8 C)     Temp Source 01/23/20 1513 Oral     SpO2 01/23/20 1513 98 %     Weight --      Height --      Head Circumference --      Peak Flow --      Pain Score 01/23/20 1514 0     Pain Loc --      Pain Edu? --      Excl. in [place]? --    No data found.  Updated Vital Signs BP (!) 171/100 (BP Location: Right Arm)   Pulse (!) 114  Temp 100 F (37.8 C) (Oral)   Resp (!) 24   SpO2 98%   Visual Acuity Right Eye Distance:   Left Eye Distance:   Bilateral Distance:    Right Eye Near:   Left Eye Near:    Bilateral Near:     Physical Exam Vitals reviewed.  Constitutional:      Appearance: He is well-developed.     Comments: No acute distress  HENT:     Head: Normocephalic and atraumatic.     Nose: Nose normal.  Eyes:     Conjunctiva/sclera: Conjunctivae normal.  Cardiovascular:     Rate and Rhythm: Regular rhythm. Tachycardia present.  Pulmonary:     Effort: Pulmonary effort is normal. No respiratory distress.     Comments: Mild crackles at bilateral bases, more prominent on the left side Abdominal:     General: There is no distension.     Comments: Soft, mildly distended, nontender to palpation throughout  Musculoskeletal:        General: Normal range of motion.     Cervical back: Neck supple.  Skin:    General: Skin is warm and dry.  Neurological:     Mental Status: He is alert and oriented to person, place, and time.      UC Treatments / Results  Labs (all labs ordered are listed, but only abnormal results are displayed) Labs Reviewed  CBG MONITORING, ED - Abnormal; Notable for the following components:      Result Value   Glucose-Capillary 111 (*)    All other components within normal limits  SARS CORONAVIRUS 2 (TAT 6-24 HRS)    EKG   Radiology DG Chest 2 View  Result Date: 01/23/2020 CLINICAL DATA:  44 year old male with cough and chest pain. EXAM: CHEST - 2 VIEW COMPARISON:  Chest radiograph dated  02/08/2018. FINDINGS: There is a large area of nodularity involving the left lung most consistent with pneumonia. Clinical correlation and follow-up to resolution recommended. The right lung is clear. There is no pleural effusion pneumothorax. The cardiac silhouette is within limits. No acute osseous pathology. IMPRESSION: Left lung nodularity, likely infiltrate. Follow-up to resolution recommended. Electronically Signed   By: Anner Crete M.D.   On: 01/23/2020 15:57    Procedures Procedures (including critical care time)  Medications Ordered in UC Medications  cefTRIAXone (ROCEPHIN) injection 1 g (1 g Intramuscular Given 01/23/20 1640)  ondansetron (ZOFRAN-ODT) disintegrating tablet 8 mg (8 mg Oral Given 01/23/20 1639)    Initial Impression / Assessment and Plan / UC Course  I have reviewed the triage vital signs and the nursing notes.  Pertinent labs & imaging results that were available during my care of the patient were reviewed by me and considered in my medical decision making (see chart for details).  Clinical Course as of Jan 22 1700  Fri Jan 23, 2020  1544 EKG sinus tach, no acute signs of ischemia or infarction    [HW]    Clinical Course User Index [HW] Jeancarlos Marchena C, PA-C    Chest x-ray concerning for pneumonia, treating for community-acquired pneumonia, Covid test pending.  Providing Rocephin prior to discharge, Augmentin and azithromycin courses.  Tessalon and Mucinex for further congestion and cough.  EKG without acute signs of ischemia or infarction, lower suspicion of ACS given associated fever body aches and chills, but did discuss with patient cannot rule this out and he is higher risk.  Advised if chest pain coming on and staying worsening changing to follow-up  in emergency room for further evaluation.  Rest and fluids, Zofran for nausea, push fluids.  Monitor for gradual resolution of symptoms, follow-up chest x-ray in 4 to 6 weeks.  Hypertension-patient has  not taken medicines today, advised to take medicines as prescribed, continue to monitor pressure and symptoms.  Follow-up in emergency room if any symptoms progressing or worsening.  Discussed strict return precautions. Patient verbalized understanding and is agreeable with plan.  Final Clinical Impressions(s) / UC Diagnoses   Final diagnoses:  Community acquired pneumonia of left lung, unspecified part of lung  Non-intractable vomiting with nausea, unspecified vomiting type  Essential hypertension  Chest wall pain     Discharge Instructions     X-ray with area concerning for pneumonia Begin Augmentin twice daily for 1 week Azithromycin-2 tabs today, 1 tab for the following 4 days Tessalon for cough Mucinex twice daily for congestion Rest and fluids Tylenol and ibuprofen for chest discomfort Follow up for repeat chest xray here or with primary care in 4-6 weeks  If you have any worsening dizziness, lightheadedness, chest pain, shortness of breath please go to the emergency room immediately    ED Prescriptions    Medication Sig Dispense Auth. Provider   amoxicillin-clavulanate (AUGMENTIN) 875-125 MG tablet Take 1 tablet by mouth every 12 (twelve) hours for 7 days. 14 tablet Kammi Hechler C, PA-C   azithromycin (ZITHROMAX) 250 MG tablet Take 1 tablet (250 mg total) by mouth daily. Take first 2 tablets together, then 1 every day until finished. 6 tablet Constantine Ruddick C, PA-C   benzonatate (TESSALON) 200 MG capsule Take 1 capsule (200 mg total) by mouth 3 (three) times daily as needed for up to 7 days for cough. 28 capsule Jhase Creppel C, PA-C   guaiFENesin (MUCINEX) 600 MG 12 hr tablet Take 1 tablet (600 mg total) by mouth 2 (two) times daily. 20 tablet Bryan Omura C, PA-C   ondansetron (ZOFRAN ODT) 4 MG disintegrating tablet Take 1-2 tablets (4-8 mg total) by mouth every 8 (eight) hours as needed for nausea or vomiting. 20 tablet Amauri Medellin, [place] C, PA-C     PDMP not  reviewed this encounter.   Janith Lima, PA-C 01/23/20 1703

## 2020-06-18 ENCOUNTER — Encounter (HOSPITAL_COMMUNITY): Payer: Self-pay | Admitting: Emergency Medicine

## 2020-06-18 ENCOUNTER — Other Ambulatory Visit: Payer: Self-pay

## 2020-06-18 ENCOUNTER — Emergency Department (HOSPITAL_COMMUNITY): Payer: Self-pay

## 2020-06-18 ENCOUNTER — Emergency Department (HOSPITAL_COMMUNITY)
Admission: EM | Admit: 2020-06-18 | Discharge: 2020-06-18 | Disposition: A | Discharge status: Home / Self Care | Payer: Self-pay | Attending: Emergency Medicine | Admitting: Emergency Medicine

## 2020-06-18 DIAGNOSIS — Z79899 Other long term (current) drug therapy: Secondary | ICD-10-CM | POA: Insufficient documentation

## 2020-06-18 DIAGNOSIS — R079 Chest pain, unspecified: Secondary | ICD-10-CM

## 2020-06-18 DIAGNOSIS — F17210 Nicotine dependence, cigarettes, uncomplicated: Secondary | ICD-10-CM | POA: Insufficient documentation

## 2020-06-18 DIAGNOSIS — K219 Gastro-esophageal reflux disease without esophagitis: Secondary | ICD-10-CM | POA: Insufficient documentation

## 2020-06-18 DIAGNOSIS — I10 Essential (primary) hypertension: Secondary | ICD-10-CM | POA: Insufficient documentation

## 2020-06-18 LAB — COMPREHENSIVE METABOLIC PANEL WITH GFR
ALT: 26 U/L (ref 0–44)
AST: 14 U/L — ABNORMAL LOW (ref 15–41)
Albumin: 4.1 g/dL (ref 3.5–5.0)
Alkaline Phosphatase: 69 U/L (ref 38–126)
Anion gap: 10 (ref 5–15)
BUN: 11 mg/dL (ref 6–20)
CO2: 25 mmol/L (ref 22–32)
Calcium: 9.3 mg/dL (ref 8.9–10.3)
Chloride: 100 mmol/L (ref 98–111)
Creatinine, Ser: 1.04 mg/dL (ref 0.61–1.24)
GFR, Estimated: >60 mL/min
Glucose, Bld: 179 mg/dL — ABNORMAL HIGH (ref 70–99)
Potassium: 3.4 mmol/L — ABNORMAL LOW (ref 3.5–5.1)
Sodium: 135 mmol/L (ref 135–145)
Total Bilirubin: 0.5 mg/dL (ref 0.3–1.2)
Total Protein: 7.8 g/dL (ref 6.5–8.1)

## 2020-06-18 LAB — CBC
HCT: 45.4 % (ref 39.0–52.0)
Hemoglobin: 15.1 g/dL (ref 13.0–17.0)
MCH: 26.9 pg (ref 26.0–34.0)
MCHC: 33.3 g/dL (ref 30.0–36.0)
MCV: 80.8 fL (ref 80.0–100.0)
Platelets: 327 K/uL (ref 150–400)
RBC: 5.62 MIL/uL (ref 4.22–5.81)
RDW: 13.4 % (ref 11.5–15.5)
WBC: 12.9 K/uL — ABNORMAL HIGH (ref 4.0–10.5)
nRBC: 0 % (ref 0.0–0.2)

## 2020-06-18 LAB — TROPONIN I (HIGH SENSITIVITY)
Troponin I (High Sensitivity): 14 ng/L
Troponin I (High Sensitivity): 12 ng/L (ref <18)

## 2020-06-18 MED ORDER — FAMOTIDINE IN NACL 20-0.9 MG/50ML-% IV SOLN
20.0000 mg | Freq: Once | INTRAVENOUS | Status: AC
  Administered 2020-06-18: 20 mg via INTRAVENOUS
  Filled 2020-06-18: qty 50

## 2020-06-18 MED ORDER — OMEPRAZOLE 20 MG PO CPDR: 20.0000 mg | DELAYED_RELEASE_CAPSULE | Freq: Two times a day (BID) | ORAL | 0 refills | Status: DC

## 2020-06-18 MED ORDER — MORPHINE SULFATE (PF) 4 MG/ML IV SOLN
4.0000 mg | Freq: Once | INTRAVENOUS | Status: AC
  Administered 2020-06-18: 4 mg via INTRAVENOUS
  Filled 2020-06-18: qty 1

## 2020-06-18 MED ORDER — METOPROLOL TARTRATE 5 MG/5ML IV SOLN
5.0000 mg | Freq: Once | INTRAVENOUS | Status: AC
  Administered 2020-06-18: 5 mg via INTRAVENOUS
  Filled 2020-06-18: qty 5

## 2020-06-18 MED ORDER — SUCRALFATE 1 G PO TABS: 1.0000 g | ORAL_TABLET | Freq: Three times a day (TID) | ORAL | 0 refills | Status: DC

## 2020-06-18 MED ORDER — IOHEXOL 350 MG/ML SOLN
100.0000 mL | Freq: Once | INTRAVENOUS | Status: AC | PRN
  Administered 2020-06-18: 100 mL via INTRAVENOUS

## 2020-06-18 NOTE — ED Triage Notes (Signed)
Emergency Medicine Provider Triage Evaluation Note  Jordan Sparks , a 45 y.o. male  was evaluated in triage.  Pt complains of tearing back pain that radiates to the chest for the last three days, worsening today. Crushing chest pain, SOB, nausea, vomiting today, diaphoresis. Num,mbness into fingers bilaterally.   BP left 174/115 BP right 163/112  Review of Systems  Positive: CP, SOB, nausea, vomiting  Negative: Fevers, abdominal pain   Physical Exam  There were no vitals taken for this visit. Gen:   Awake, no distress, no diaphoresis    HEENT:  Atraumatic Resp:  Normal effort Cardiac:  Minimally tachycardic  Abd:   Nondistended, nontender MSK:   Moves extremities without difficulty Neuro:  Speech clear, normal strength and sensation, normal gait.  Medical Decision Making  Medically screening exam initiated at 11:11 AM.  Appropriate orders placed.  Jordan Sparks was informed that the remainder of the evaluation will be completed by another provider, this initial triage assessment does not replace that evaluation, and the importance of remaining in the ED until their evaluation is complete.  Clinical Impression  CP story concerning for dissection, however pt looks stable.Advised nursing that he needs to be pulled back next.  Ekg sinus tach with some inverted T waves, looks similar to old.    Farrel Gordon, PA-C 06/18/20 1120

## 2020-06-18 NOTE — ED Provider Notes (Signed)
[facility] EMERGENCY DEPARTMENT Provider Note   CSN: [id number] Arrival date & time: 06/18/20  1046     History Chief complaint chest pain  Jordan Sparks is a 45 y.o. male.  HPI  HPI: A 45 year old patient with a history of hypertension and obesity presents for evaluation of chest pain. Initial onset of pain was more than 6 hours ago. The patient's chest pain is sharp and is not worse with exertion. The patient's chest pain is middle- or left-sided, is not well-localized, is not described as heaviness/pressure/tightness and does radiate to the arms/jaw/neck. The patient does not complain of nausea and denies diaphoresis. The patient has no history of stroke, has no history of peripheral artery disease, has not smoked in the past 90 days, denies any history of treated diabetes, has no relevant family history of coronary artery disease (first degree relative at less than age 30) and has no history of hypercholesterolemia.  Patient states the pain has been sharp and mostly in his back.  He has also noticed a lot of belching recently and symptoms seem to improved after a bout of that. Past Medical History:  Diagnosis Date  . Anxiety   . Depression   . Hypertension   . Manic disorder [facility])     Patient Active Problem List   Diagnosis Date Noted  . GERD (gastroesophageal reflux disease) 02/09/2018  . Hyperglycemia 02/08/2018  . Atypical chest pain 02/08/2018  . Hypokalemia 02/08/2018  . Leukocytosis 02/08/2018  . Intractable nausea and vomiting 02/08/2018  . Depression   . Hypertension   . Anxiety   . Hyperglycemia without ketosis   . Manic disorder [facility])     Past Surgical History:  Procedure Laterality Date  . Dental procedure         Family History  Problem Relation Age of Onset  . Seizures Brother     Social History   Tobacco Use  . Smoking status: Current Every Day Smoker    Packs/day: 0.25    Types: Cigarettes  . Smokeless tobacco:  Never Used  Substance Use Topics  . Alcohol use: No  . Drug use: Yes    Types: Marijuana    Home Medications Prior to Admission medications   Medication Sig Start Date End Date Taking? Authorizing Provider  omeprazole (PRILOSEC) 20 MG capsule Take 1 capsule (20 mg total) by mouth 2 (two) times daily before a meal for 10 days. 06/18/20 06/28/20 Yes Dorie Rank, MD  sucralfate (CARAFATE) 1 g tablet Take 1 tablet (1 g total) by mouth 4 (four) times daily -  with meals and at bedtime for 7 days. 06/18/20 06/25/20 Yes Dorie Rank, MD  azithromycin (ZITHROMAX) 250 MG tablet Take 1 tablet (250 mg total) by mouth daily. Take first 2 tablets together, then 1 every day until finished. 01/23/20   Wieters, Hallie C, PA-C  blood glucose meter kit and supplies KIT Dispense based on patient and insurance preference. Use up to four times daily as directed. (FOR ICD-9 250.00, 250.01). 02/09/18   Manuella Ghazi, Pratik D, DO  buPROPion HCl (WELLBUTRIN PO) Take 150 mg by mouth 2 (two) times daily.     [provider]  busPIRone (BUSPAR) 15 MG tablet Take 15 mg by mouth 3 (three) times daily as needed (anxiety).    [provider]  carvedilol (COREG) 6.25 MG tablet Take 6.25 mg by mouth 2 (two) times daily.     [provider]  Cholecalciferol (VITAMIN D) 50 MCG (2000 UT)  CAPS Take 2,000 Units by mouth daily.    [provider]  CLONIDINE HCL PO Take 0.1-0.2 mg by mouth See admin instructions. Take one tablet by mouth every morning and two tablets at night     [provider]  FLUoxetine (PROZAC) 20 MG capsule Take 20 mg by mouth daily.    [provider]  furosemide (LASIX) 20 MG tablet Take 20 mg by mouth daily.    [provider]  glipiZIDE (GLUCOTROL XL) 2.5 MG 24 hr tablet Take 1 tablet (2.5 mg total) by mouth daily with breakfast. 02/10/18 03/12/18  Manuella Ghazi, Pratik D, DO  guaiFENesin (MUCINEX) 600 MG 12 hr tablet Take 1 tablet (600 mg total) by mouth 2 (two) times  daily. 01/23/20   Wieters, Hallie C, PA-C  ibuprofen (ADVIL,MOTRIN) 200 MG tablet Take 400 mg by mouth every 6 (six) hours as needed for headache (pain).    [provider]  LITHIUM PO Take 300 mg by mouth 2 (two) times daily.     [provider]  loratadine (CLARITIN) 10 MG tablet Take 10 mg by mouth at bedtime.    [provider]  metFORMIN (GLUCOPHAGE) 500 MG tablet Take 1 tablet (500 mg total) by mouth 2 (two) times daily with a meal. 02/09/18 03/11/18  Manuella Ghazi, Pratik D, DO  Multiple Vitamin (MULTIVITAMIN WITH MINERALS) TABS tablet Take 1 tablet by mouth daily as needed (energy).    [provider]  ondansetron (ZOFRAN ODT) 4 MG disintegrating tablet Take 1-2 tablets (4-8 mg total) by mouth every 8 (eight) hours as needed for nausea or vomiting. 01/23/20   Wieters, Hallie C, PA-C  ondansetron (ZOFRAN) 4 MG tablet Take 1 tablet (4 mg total) by mouth every 6 (six) hours as needed for nausea. 02/09/18   Heath Lark D, DO    Allergies    Patient has no known allergies.  Review of Systems   Review of Systems  All other systems reviewed and are negative.   Physical Exam Updated Vital Signs BP (!) 155/98 (BP Location: Right Arm)   Pulse 95   Temp 98.4 F (36.9 C) (Oral)   Resp 15   SpO2 98%   Physical Exam Vitals and nursing note reviewed.  Constitutional:      General: He is not in acute distress.    Appearance: He is well-developed.  HENT:     Head: Normocephalic and atraumatic.     Right Ear: External ear normal.     Left Ear: External ear normal.  Eyes:     General: No scleral icterus.       Right eye: No discharge.        Left eye: No discharge.     Conjunctiva/sclera: Conjunctivae normal.  Neck:     Trachea: No tracheal deviation.  Cardiovascular:     Rate and Rhythm: Normal rate and regular rhythm.  Pulmonary:     Effort: Pulmonary effort is normal. No respiratory distress.     Breath sounds: Normal breath sounds. No stridor. No  wheezing or rales.  Abdominal:     General: Bowel sounds are normal. There is no distension.     Palpations: Abdomen is soft.     Tenderness: There is no abdominal tenderness. There is no guarding or rebound.  Musculoskeletal:        General: No tenderness.     Cervical back: Neck supple.  Skin:    General: Skin is warm and dry.     Findings:  No rash.  Neurological:     Mental Status: He is alert.     Cranial Nerves: No cranial nerve deficit (no facial droop, extraocular movements intact, no slurred speech).     Sensory: No sensory deficit.     Motor: No abnormal muscle tone or seizure activity.     Coordination: Coordination normal.     ED Results / Procedures / Treatments   Labs (all labs ordered are listed, but only abnormal results are displayed) Labs Reviewed  CBC - Abnormal; Notable for the following components:      Result Value   WBC 12.9 (*)    All other components within normal limits  COMPREHENSIVE METABOLIC PANEL - Abnormal; Notable for the following components:   Potassium 3.4 (*)    Glucose, Bld 179 (*)    AST 14 (*)    All other components within normal limits  TROPONIN I (HIGH SENSITIVITY)  TROPONIN I (HIGH SENSITIVITY)    EKG EKG Interpretation  Date/Time:  Friday June 18 2020 11:14:24 EDT Ventricular Rate:  105 PR Interval:  178 QRS Duration: 96 QT Interval:  368 QTC Calculation: 486 R Axis:   61 Text Interpretation: Sinus tachycardia Otherwise normal ECG No significant change since last tracing Confirmed by Dorie Rank [id number]) on 06/18/2020 11:44:07 AM   Radiology DG Chest 1 View  Result Date: 06/18/2020 CLINICAL DATA:  Chest pain and shortness of breath EXAM: CHEST  1 VIEW COMPARISON:  January 23, 2020 FINDINGS: Lungs are now clear. Heart size is upper normal with pulmonary vascularity normal. No adenopathy. No pneumothorax. No bone lesions. IMPRESSION: Lungs clear.  Heart upper normal in size.  No adenopathy evident. Electronically Signed   By:  Lowella Grip III M.D.   On: 06/18/2020 11:47   CT Angio Chest/Abd/Pel for Dissection W and/or W/WO  Result Date: 06/18/2020 CLINICAL DATA:  Chest and abdominal pain radiating to back. Shortness of breath EXAM: CT ANGIOGRAPHY CHEST, ABDOMEN AND PELVIS TECHNIQUE: Non-contrast CT of the chest was initially obtained. Multidetector CT imaging through the chest, abdomen and pelvis was performed using the standard protocol during bolus administration of intravenous contrast. Multiplanar reconstructed images and MIPs were obtained and reviewed to evaluate the vascular anatomy. CONTRAST:  141m OMNIPAQUE IOHEXOL 350 MG/ML SOLN COMPARISON:  Chest radiograph June 18, 2020 FINDINGS: CTA CHEST FINDINGS Cardiovascular: There is no demonstrable intramural hematoma in the thoracic aorta on noncontrast enhanced study. There is no evident mediastinal hematoma. There is no appreciable thoracic aortic aneurysm or dissection. No appreciable ulceration or atherosclerotic irregularity noted in the thoracic aorta. Visualized great vessels appear patent without aneurysm or dissection. No demonstrable pulmonary embolus. Note that timing bolus for this study was not designed to optimize pulmonary arterial vascular visualization. There is no evident pericardial effusion or pericardial thickening. Mediastinum/Nodes: Visualized thyroid appears normal. No evident adenopathy. No appreciable esophageal lesions. Lungs/Pleura: Lungs are clear. No evident pleural effusion. No pneumothorax. Trachea and major bronchial structures appear normal. Musculoskeletal: No demonstrable fracture or dislocation. No blastic or lytic bone lesions. No evident chest wall lesions. Review of the MIP images confirms the above findings. CTA ABDOMEN AND PELVIS FINDINGS VASCULAR Aorta: There is no abdominal aortic aneurysm or dissection. No appreciable atherosclerotic irregularity noted in the aorta. Celiac: Celiac artery and its branches are widely patent. No  aneurysm or dissection involving these vessels. SMA: Superior mesenteric artery and its branches are widely patent. No aneurysm or dissection involving these vessels. Renals: There is a single renal artery on each  side. No evident aneurysm or dissection involving the renal arteries or branches. No evident fibromuscular dysplasia on either side. IMA: Inferior mesenteric artery and its branches are patent. No aneurysm or dissection involving these vessels. Inflow: Pelvic arterial vessels appear widely patent without appreciable atherosclerotic irregularity. No aneurysm or dissection involving these vessels. The proximal profunda femoral and superficial femoral arteries are also patent without aneurysm or dissection. Veins: No obvious venous abnormality within the limitations of this arterial phase study. Review of the MIP images confirms the above findings. NON-VASCULAR Hepatobiliary: There is apparent hepatic steatosis. Liver measures 20.0 cm in length. No focal liver lesions are appreciable. Gallbladder wall is not appreciably thickened. There is no biliary duct dilatation. Pancreas: There is no pancreatic mass or inflammatory focus. Spleen: No splenic lesions are evident. Adrenals/Urinary Tract: Adrenals bilaterally appear normal. There is no evident renal mass or hydronephrosis on either side. There is no evident renal or ureteral calculus on either side. Urinary bladder is midline with wall thickness within normal limits. Stomach/Bowel: There is no appreciable bowel wall or mesenteric thickening. No evident bowel obstruction. The terminal ileum appears normal. Appendix appears normal. There is no evident free air or portal venous air. Lymphatic: There is no evident adenopathy in the abdomen or pelvis. Reproductive: Occasional prostatic calculi noted. Prostate and seminal vesicles normal in size and contour. Other: No abscess or ascites evident in the abdomen or pelvis. There is slight fat in the umbilicus.  Musculoskeletal: No fracture or dislocation. No blastic or lytic bone lesions. No appreciable intramuscular lesions. Review of the MIP images confirms the above findings. IMPRESSION: CT angiogram chest: 1. No thoracic aortic aneurysm or dissection. No evident intramural hematoma or plaque ulceration. No appreciable atherosclerotic plaque noted in the thoracic aorta or visualized great vessels. 2.  No pulmonary embolus evident. 3.  Lungs clear. 4.  No evident adenopathy. CT angiogram abdomen; CT angiogram pelvis: 1. No aneurysm or dissection involving the aorta, major mesenteric, and major pelvic arterial vessels. No appreciable atherosclerotic plaque noted in the aorta, major mesenteric, and major pelvic arterial vessels. 2. Prominent liver with hepatic steatosis. 3. No bowel wall thickening or bowel obstruction. No abscess in the abdomen or pelvis. Appendix appears normal. 4. No renal or ureteral calculi. No hydronephrosis. Urinary bladder wall thickness normal. Electronically Signed   By: Lowella Grip III M.D.   On: 06/18/2020 13:49    Procedures Procedures   Medications Ordered in ED Medications  morphine 4 MG/ML injection 4 mg (4 mg Intravenous Given 06/18/20 1243)  famotidine (PEPCID) IVPB 20 mg premix (0 mg Intravenous Stopped 06/18/20 1302)  metoprolol tartrate (LOPRESSOR) injection 5 mg (5 mg Intravenous Given 06/18/20 1242)  iohexol (OMNIPAQUE) 350 MG/ML injection 100 mL (100 mLs Intravenous Contrast Given 06/18/20 1321)    ED Course  I have reviewed the triage vital signs and the nursing notes.  Pertinent labs & imaging results that were available during my care of the patient were reviewed by me and considered in my medical decision making (see chart for details).  Clinical Course as of 06/18/20 1520  Fri Jun 18, 2020  1424 CT angiogram does not show any acute findings.  Initial troponin normal. [JK]    Clinical Course User Index [JK] Dorie Rank, MD   MDM Rules/Calculators/A&P HEAR  Score: 1                        Patient presented to the ED for evaluation of chest pain.  Patient was having sharp pain in his back.  Presentation was concerning for the possibility of aortic dissection.  CT angiogram was performed and it does not show any acute abnormalities.  Patient serial cardiac enzymes are normal.  No findings to suggest acute coronary syndrome.  Patient was hypertensive but he does have history of hypertension.  He has been noting some increased burping and belching suspect symptoms may be related to gastroesophageal reflux.  Will discharge with PPI and Carafate. Final Clinical Impression(s) / ED Diagnoses Final diagnoses:  Chest pain, unspecified type  Gastroesophageal reflux disease, unspecified whether esophagitis present    Rx / DC Orders ED Discharge Orders         Ordered    omeprazole (PRILOSEC) 20 MG capsule  2 times daily before meals        06/18/20 1518    sucralfate (CARAFATE) 1 g tablet  3 times daily with meals & bedtime        06/18/20 1518           Dorie Rank, MD 06/18/20 1520

## 2020-06-18 NOTE — ED Triage Notes (Signed)
Pt here with c/o chest pain along with sob and n/v pain starts in his back and radiates to front , pt is a smoker and drives a truck

## 2020-06-18 NOTE — Discharge Instructions (Addendum)
Take the medications as prescribed.  Follow-up with your primary care doctor to be rechecked.  Return as needed for worsening symptoms 

## 2020-10-19 ENCOUNTER — Observation Stay (HOSPITAL_COMMUNITY)
Admission: EM | Admit: 2020-10-19 | Discharge: 2020-10-19 | Disposition: A | Discharge status: Home / Self Care | Payer: Self-pay | Attending: Internal Medicine | Admitting: Internal Medicine

## 2020-10-19 ENCOUNTER — Emergency Department (HOSPITAL_COMMUNITY): Payer: Self-pay

## 2020-10-19 ENCOUNTER — Encounter (HOSPITAL_COMMUNITY): Payer: Self-pay | Admitting: Emergency Medicine

## 2020-10-19 DIAGNOSIS — F17210 Nicotine dependence, cigarettes, uncomplicated: Secondary | ICD-10-CM | POA: Insufficient documentation

## 2020-10-19 DIAGNOSIS — R61 Generalized hyperhidrosis: Secondary | ICD-10-CM | POA: Insufficient documentation

## 2020-10-19 DIAGNOSIS — R55 Syncope and collapse: Secondary | ICD-10-CM | POA: Insufficient documentation

## 2020-10-19 DIAGNOSIS — I160 Hypertensive urgency: Secondary | ICD-10-CM | POA: Diagnosis present

## 2020-10-19 DIAGNOSIS — I10 Essential (primary) hypertension: Principal | ICD-10-CM | POA: Insufficient documentation

## 2020-10-19 DIAGNOSIS — Z7984 Long term (current) use of oral hypoglycemic drugs: Secondary | ICD-10-CM | POA: Insufficient documentation

## 2020-10-19 DIAGNOSIS — Z79899 Other long term (current) drug therapy: Secondary | ICD-10-CM | POA: Insufficient documentation

## 2020-10-19 LAB — COMPREHENSIVE METABOLIC PANEL
ALT: 21 U/L (ref 0–44)
AST: 16 U/L (ref 15–41)
Albumin: 3.7 g/dL (ref 3.5–5.0)
Alkaline Phosphatase: 66 U/L (ref 38–126)
Anion gap: 11 (ref 5–15)
BUN: 8 mg/dL (ref 6–20)
CO2: 26 mmol/L (ref 22–32)
Calcium: 9.5 mg/dL (ref 8.9–10.3)
Chloride: 101 mmol/L (ref 98–111)
Creatinine, Ser: 1.04 mg/dL (ref 0.61–1.24)
GFR, Estimated: >60 mL/min (ref >60)
Glucose, Bld: 128 mg/dL — ABNORMAL HIGH (ref 70–99)
Potassium: 3.1 mmol/L — ABNORMAL LOW (ref 3.5–5.1)
Sodium: 138 mmol/L (ref 135–145)
Total Bilirubin: 0.6 mg/dL (ref 0.3–1.2)
Total Protein: 6.8 g/dL (ref 6.5–8.1)

## 2020-10-19 LAB — CBC
HCT: 43 % (ref 39.0–52.0)
Hemoglobin: 13.6 g/dL (ref 13.0–17.0)
MCH: 26.3 pg (ref 26.0–34.0)
MCHC: 31.6 g/dL (ref 30.0–36.0)
MCV: 83.2 fL (ref 80.0–100.0)
Platelets: 306 10*3/uL (ref 150–400)
RBC: 5.17 MIL/uL (ref 4.22–5.81)
RDW: 14 % (ref 11.5–15.5)
WBC: 14.3 10*3/uL — ABNORMAL HIGH (ref 4.0–10.5)
nRBC: 0 % (ref 0.0–0.2)

## 2020-10-19 LAB — CBG MONITORING, ED
Glucose-Capillary: 118 mg/dL — ABNORMAL HIGH (ref 70–99)
Glucose-Capillary: 115 mg/dL — ABNORMAL HIGH (ref 70–99)
Glucose-Capillary: 91 mg/dL (ref 70–99)
Glucose-Capillary: 97 mg/dL (ref 70–99)

## 2020-10-19 LAB — RAPID URINE DRUG SCREEN, HOSP PERFORMED
Amphetamines: NOT DETECTED
Barbiturates: NOT DETECTED
Benzodiazepines: NOT DETECTED
Cocaine: NOT DETECTED
Opiates: NOT DETECTED
Tetrahydrocannabinol: POSITIVE — AB

## 2020-10-19 LAB — TROPONIN I (HIGH SENSITIVITY)
Troponin I (High Sensitivity): 14 ng/L (ref <18)
Troponin I (High Sensitivity): 12 ng/L (ref <18)

## 2020-10-19 MED ORDER — CLONIDINE HCL 0.2 MG PO TABS
0.2000 mg | ORAL_TABLET | Freq: Once | ORAL | Status: AC
  Administered 2020-10-19: 0.2 mg via ORAL
  Filled 2020-10-19: qty 1

## 2020-10-19 MED ORDER — CARVEDILOL 3.125 MG PO TABS
6.2500 mg | ORAL_TABLET | Freq: Two times a day (BID) | ORAL | Status: DC
  Administered 2020-10-19: 6.25 mg via ORAL
  Filled 2020-10-19: qty 2

## 2020-10-19 MED ORDER — SODIUM CHLORIDE 0.9 % IV SOLN: INTRAVENOUS | Status: DC

## 2020-10-19 MED ORDER — LABETALOL HCL 5 MG/ML IV SOLN
20.0000 mg | Freq: Once | INTRAVENOUS | Status: AC
  Administered 2020-10-19: 20 mg via INTRAVENOUS
  Filled 2020-10-19: qty 4

## 2020-10-19 MED ORDER — SODIUM CHLORIDE 0.9 % IV SOLN: 250.0000 mL | INTRAVENOUS | Status: DC | PRN

## 2020-10-19 MED ORDER — SODIUM CHLORIDE 0.9% FLUSH: 3.0000 mL | Freq: Two times a day (BID) | INTRAVENOUS | Status: DC

## 2020-10-19 MED ORDER — SODIUM CHLORIDE 0.9% FLUSH: 3.0000 mL | INTRAVENOUS | Status: DC | PRN

## 2020-10-19 NOTE — ED Notes (Signed)
Patient left AMA. Physician notified.

## 2020-10-19 NOTE — ED Triage Notes (Signed)
Pt arrives via EMS from home with sudden fatigue and diaphoresis. Pt hypertensive for EMS, states he took all his meds today around 4. CBG 118. Pt with visible sweat on forehead.

## 2020-10-19 NOTE — ED Provider Notes (Addendum)
[facility] EMERGENCY DEPARTMENT Provider Note   CSN: [id number] Arrival date & time: 10/19/20  1709     History Chief Complaint  Patient presents with   Hypertension    Jordan Sparks is a 45 y.o. male.   Hypertension   Patient presents to the ED for evaluation of an episode of diaphoresis and weakness.  Patient states he had it was at home today.  He was feeling fine.  He suddenly became extremely fatigued and diaphoretic.  He felt like his blood sugar was dropping.  This lasted for maybe 15 minutes.  He did not lose consciousness but he did feel out of it.  Patient drank a sugary drink and ate a piece of candy.  His symptoms slowly improved.  Patient called EMS.  He feels like his symptoms have continued to improve.  He feels like the diaphoresis is diminishing.  He feels like his mental fog is also clearing.  He is not having any chest pain.  He is not having any abdominal pain.  Is not had any diarrhea.  No unilateral numbness or weakness.  Past Medical History:  Diagnosis Date   Anxiety    Depression    Hypertension    Manic disorder [facility])     Patient Active Problem List   Diagnosis Date Noted   GERD (gastroesophageal reflux disease) 02/09/2018   Hyperglycemia 02/08/2018   Atypical chest pain 02/08/2018   Hypokalemia 02/08/2018   Leukocytosis 02/08/2018   Intractable nausea and vomiting 02/08/2018   Depression    Hypertension    Anxiety    Hyperglycemia without ketosis    Manic disorder ([place])     Past Surgical History:  Procedure Laterality Date   Dental procedure         Family History  Problem Relation Age of Onset   Seizures Brother     Social History   Tobacco Use   Smoking status: Every Day    Packs/day: 0.25    Types: Cigarettes   Smokeless tobacco: Never  Substance Use Topics   Alcohol use: No   Drug use: Yes    Types: Marijuana    Home Medications Prior to Admission medications   Medication Sig Start Date  End Date Taking? Authorizing Provider  buPROPion (WELLBUTRIN SR) 150 MG 12 hr tablet Take 150 mg by mouth 2 (two) times daily. 09/15/20  Yes [provider]  busPIRone (BUSPAR) 15 MG tablet Take 15 mg by mouth 3 (three) times daily as needed (anxiety).   Yes [provider]  carvedilol (COREG) 6.25 MG tablet Take 6.25 mg by mouth 2 (two) times daily.    Yes [provider]  cetirizine (ZYRTEC) 10 MG tablet Take 10 mg by mouth daily.   Yes [provider]  Cholecalciferol (VITAMIN D) 50 MCG (2000 UT) CAPS Take 2,000 Units by mouth daily.   Yes [provider]  cloNIDine (CATAPRES) 0.1 MG tablet Take 0.2 mg by mouth 2 (two) times daily. Take 2 tablets (0.2 mg) BID 07/27/20  Yes [provider]  FLUoxetine (PROZAC) 20 MG capsule Take 20 mg by mouth daily.   Yes [provider]  fluticasone (FLONASE) 50 MCG/ACT nasal spray Place 2 sprays into both nostrils daily. 07/12/20  Yes [provider]  furosemide (LASIX) 20 MG tablet Take 20 mg by mouth daily.   Yes [provider]  gabapentin (NEURONTIN) 300 MG capsule Take 300 mg by mouth 2 (two) times daily. 09/15/20  Yes [provider]  glipiZIDE (GLUCOTROL) 5 MG tablet Take 5 mg by mouth 2 (two) times daily. 09/15/20  Yes [provider]  ibuprofen (ADVIL,MOTRIN) 200 MG tablet Take 400 mg by mouth every 6 (six) hours as needed for headache (pain).   Yes [provider]  lithium carbonate 300 MG capsule Take 300 mg by mouth 2 (two) times daily. 09/15/20  Yes [provider]  losartan (COZAAR) 100 MG tablet Take 100 mg by mouth daily. 09/15/20  Yes [provider]  meloxicam (MOBIC) 7.5 MG tablet Take 7.5 mg by mouth daily as needed for pain. 09/15/20  Yes [provider]  metFORMIN (GLUCOPHAGE) 1000 MG tablet Take 1,000 mg by mouth 2 (two) times daily. 09/15/20  Yes [provider]  Multiple Vitamin (MULTIVITAMIN WITH MINERALS) TABS  tablet Take 1 tablet by mouth daily.   Yes [provider]  Vitamin D, Ergocalciferol, (DRISDOL) 1.25 MG (50000 UNIT) CAPS capsule Take 50,000 Units by mouth once a week. 07/27/20  Yes [provider]  azithromycin (ZITHROMAX) 250 MG tablet Take 1 tablet (250 mg total) by mouth daily. Take first 2 tablets together, then 1 every day until finished. Patient not taking: No sig reported 01/23/20   Wieters, Hallie C, PA-C  blood glucose meter kit and supplies KIT Dispense based on patient and insurance preference. Use up to four times daily as directed. (FOR ICD-9 250.00, 250.01). 02/09/18   Manuella Ghazi, Pratik D, DO  glipiZIDE (GLUCOTROL XL) 2.5 MG 24 hr tablet Take 1 tablet (2.5 mg total) by mouth daily with breakfast. 02/10/18 03/12/18  Manuella Ghazi, Pratik D, DO  guaiFENesin (MUCINEX) 600 MG 12 hr tablet Take 1 tablet (600 mg total) by mouth 2 (two) times daily. Patient not taking: No sig reported 01/23/20   Wieters, Hallie C, PA-C  metFORMIN (GLUCOPHAGE) 500 MG tablet Take 1 tablet (500 mg total) by mouth 2 (two) times daily with a meal. 02/09/18 03/11/18  Manuella Ghazi, Pratik D, DO  omeprazole (PRILOSEC) 20 MG capsule Take 1 capsule (20 mg total) by mouth 2 (two) times daily before a meal for 10 days. 06/18/20 06/28/20  Dorie Rank, MD  ondansetron (ZOFRAN ODT) 4 MG disintegrating tablet Take 1-2 tablets (4-8 mg total) by mouth every 8 (eight) hours as needed for nausea or vomiting. Patient not taking: No sig reported 01/23/20   Wieters, Hallie C, PA-C  ondansetron (ZOFRAN) 4 MG tablet Take 1 tablet (4 mg total) by mouth every 6 (six) hours as needed for nausea. Patient not taking: No sig reported 02/09/18   Manuella Ghazi, Pratik D, DO  sucralfate (CARAFATE) 1 g tablet Take 1 tablet (1 g total) by mouth 4 (four) times daily -  with meals and at bedtime for 7 days. 06/18/20 06/25/20  Dorie Rank, MD  traZODone (DESYREL) 100 MG tablet Take 50 mg by mouth at bedtime as needed for sleep. 09/15/20   [provider]     Allergies    Patient has no known allergies.  Review of Systems   Review of Systems  All other systems reviewed and are negative.  Physical Exam Updated Vital Signs BP (!) 191/118   Pulse 83   Temp 97.8 F (36.6 C) (Oral)   Resp 19   Ht 1.803 m (_0 )   Wt 136.1 kg   SpO2 96%   BMI 41.84 kg/m   Physical Exam Vitals and nursing note reviewed.  Constitutional:      General: He is not in acute distress.  Appearance: He is well-developed. He is diaphoretic.     Comments: Diaphoresis noted on forehead  HENT:     Head: Normocephalic and atraumatic.     Right Ear: External ear normal.     Left Ear: External ear normal.  Eyes:     General: No scleral icterus.       Right eye: No discharge.        Left eye: No discharge.     Conjunctiva/sclera: Conjunctivae normal.  Neck:     Trachea: No tracheal deviation.  Cardiovascular:     Rate and Rhythm: Normal rate and regular rhythm.  Pulmonary:     Effort: Pulmonary effort is normal. No respiratory distress.     Breath sounds: Normal breath sounds. No stridor. No wheezing or rales.  Abdominal:     General: Bowel sounds are normal. There is no distension.     Palpations: Abdomen is soft.     Tenderness: There is no abdominal tenderness. There is no guarding or rebound.  Musculoskeletal:        General: No tenderness or deformity.     Cervical back: Neck supple.  Skin:    General: Skin is warm.     Findings: No rash.  Neurological:     General: No focal deficit present.     Mental Status: He is alert.     Cranial Nerves: No cranial nerve deficit (no facial droop, extraocular movements intact, no slurred speech).     Sensory: No sensory deficit.     Motor: No abnormal muscle tone or seizure activity.     Coordination: Coordination normal.     Comments: Equal grip strength bilaterally, normal plantar flexion bilaterally  Psychiatric:        Mood and Affect: Mood normal.    ED Results / Procedures / Treatments    Labs (all labs ordered are listed, but only abnormal results are displayed) Labs Reviewed  COMPREHENSIVE METABOLIC PANEL - Abnormal; Notable for the following components:      Result Value   Potassium 3.1 (*)    Glucose, Bld 128 (*)    All other components within normal limits  CBC - Abnormal; Notable for the following components:   WBC 14.3 (*)    All other components within normal limits  CBG MONITORING, ED - Abnormal; Notable for the following components:   Glucose-Capillary 118 (*)    All other components within normal limits  CBG MONITORING, ED - Abnormal; Notable for the following components:   Glucose-Capillary 115 (*)    All other components within normal limits  METANEPHRINES, PLASMA  RAPID URINE DRUG SCREEN, HOSP PERFORMED  CBG MONITORING, ED  CBG MONITORING, ED  TROPONIN I (HIGH SENSITIVITY)  TROPONIN I (HIGH SENSITIVITY)    EKG EKG Interpretation  Date/Time:  Tuesday October 19 2020 17:15:23 EDT Ventricular Rate:  90 PR Interval:  204 QRS Duration: 110 QT Interval:  410 QTC Calculation: 502 R Axis:   79 Text Interpretation: Sinus rhythm Borderline prolonged PR interval Prolonged QT interval Since last tracing rate slower Confirmed by Dorie Rank [phone]) on 10/19/2020 5:17:52 PM  Radiology DG Chest Port 1 View  Result Date: 10/19/2020 CLINICAL DATA:  Weakness, fatigue EXAM: PORTABLE CHEST 1 VIEW COMPARISON:  06/18/2020 FINDINGS: Cardiomegaly, vascular congestion. No overt edema. No confluent opacities or effusions. No acute bony abnormality. IMPRESSION: Cardiomegaly, vascular congestion. Electronically Signed   By: Rolm Baptise M.D.   On: 10/19/2020 18:26    Procedures Procedures   Medications  Ordered in ED Medications  sodium chloride flush (NS) 0.9 % injection 3 mL (has no administration in time range)  sodium chloride flush (NS) 0.9 % injection 3 mL (has no administration in time range)  0.9 %  sodium chloride infusion (has no administration in time range)   0.9 %  sodium chloride infusion ( Intravenous New Bag/Given 10/19/20 1757)  carvedilol (COREG) tablet 6.25 mg (6.25 mg Oral Given 10/19/20 1918)  labetalol (NORMODYNE) injection 20 mg (has no administration in time range)  cloNIDine (CATAPRES) tablet 0.2 mg (0.2 mg Oral Given 10/19/20 1918)    ED Course  I have reviewed the triage vital signs and the nursing notes.  Pertinent labs & imaging results that were available during my care of the patient were reviewed by me and considered in my medical decision making (see chart for details).  Clinical Course as of 10/19/20 2329  Tue Oct 19, 2020  1844 Blood cell count elevated.  Potassium decreased. [EH]  2122 CXR cardiomegaly  [JK]    Clinical Course User Index [JK] Dorie Rank, MD   MDM Rules/Calculators/A&P                           Patient presented to the ED for evaluation of an episode of diaphoresis and weakness.  Patient felt that his blood sugar might of been low.  However it was not measured during the episode.  Patient also was hypertensive initially and has remained hypertensive but despite taking his evening dose of medication.  Patient did have a similar episode a few months ago.  Patient continues to have elevated blood pressure.  No focal neurologic deficits.  Not currently having chest pain.  We will add serum metanephrines to screen for pheochromocytoma considering these recurrent episode of diaphoresis and hypertension.  Patient remains hypertensive I have ordered labetalol.  I will consult the medical service for admission and stabilization of his blood pressure. Final Clinical Impression(s) / ED Diagnoses Final diagnoses:  Uncontrolled hypertension  Near syncope  Diaphoresis     Dorie Rank, MD 10/19/20 2116 Plan was for the patient to be admitted to the hospital.  He has decided he is going to leave AMA.  Family is at the bedside.  Patient is aware if he can stay if he changes his mind.   Dorie Rank, MD 10/19/20  2330

## 2020-10-19 NOTE — ED Notes (Signed)
Pt given apple juice  

## 2020-10-25 LAB — METANEPHRINES, PLASMA
Metanephrine, Free: 47.1 pg/mL (ref 0.0–88.0)
Normetanephrine, Free: 32.8 pg/mL (ref 0.0–218.9)

## 2020-11-16 ENCOUNTER — Emergency Department (HOSPITAL_COMMUNITY): Admission: EM | Admit: 2020-11-16 | Discharge: 2020-11-16 | Discharge status: Against Medical Advice | Payer: Self-pay

## 2020-11-16 NOTE — ED Notes (Signed)
Patient  called for triage, no answer  x1 

## 2020-11-16 NOTE — ED Notes (Signed)
Called and unable to locate in lobby 

## 2020-11-20 ENCOUNTER — Other Ambulatory Visit: Payer: Self-pay

## 2020-11-20 ENCOUNTER — Emergency Department (HOSPITAL_COMMUNITY): Payer: No Typology Code available for payment source

## 2020-11-20 ENCOUNTER — Encounter (HOSPITAL_COMMUNITY): Payer: Self-pay | Admitting: Emergency Medicine

## 2020-11-20 ENCOUNTER — Emergency Department (HOSPITAL_COMMUNITY)
Admission: EM | Admit: 2020-11-20 | Discharge: 2020-11-20 | Disposition: A | Discharge status: Home / Self Care | Payer: No Typology Code available for payment source | Attending: Emergency Medicine | Admitting: Emergency Medicine

## 2020-11-20 DIAGNOSIS — F17210 Nicotine dependence, cigarettes, uncomplicated: Secondary | ICD-10-CM | POA: Diagnosis not present

## 2020-11-20 DIAGNOSIS — Z79899 Other long term (current) drug therapy: Secondary | ICD-10-CM | POA: Insufficient documentation

## 2020-11-20 DIAGNOSIS — S060X0A Concussion without loss of consciousness, initial encounter: Secondary | ICD-10-CM | POA: Insufficient documentation

## 2020-11-20 DIAGNOSIS — Y92410 Unspecified street and highway as the place of occurrence of the external cause: Secondary | ICD-10-CM | POA: Insufficient documentation

## 2020-11-20 DIAGNOSIS — I10 Essential (primary) hypertension: Secondary | ICD-10-CM | POA: Insufficient documentation

## 2020-11-20 DIAGNOSIS — S0990XA Unspecified injury of head, initial encounter: Secondary | ICD-10-CM | POA: Diagnosis present

## 2020-11-20 MED ORDER — MECLIZINE HCL 25 MG PO TABS: 25.0000 mg | ORAL_TABLET | Freq: Three times a day (TID) | ORAL | 0 refills | Status: DC | PRN

## 2020-11-20 MED ORDER — CARVEDILOL 3.125 MG PO TABS
6.2500 mg | ORAL_TABLET | Freq: Two times a day (BID) | ORAL | Status: DC
  Administered 2020-11-20: 6.25 mg via ORAL
  Filled 2020-11-20: qty 2

## 2020-11-20 MED ORDER — CELECOXIB 200 MG PO CAPS: 200.0000 mg | ORAL_CAPSULE | Freq: Two times a day (BID) | ORAL | 0 refills | Status: DC

## 2020-11-20 NOTE — Discharge Instructions (Addendum)
Contact a health care provider if: °Your symptoms do not improve. °You have new symptoms. °You have another injury. °Get help right away if: °You have new or worsening physical symptoms, such as: °A severe or worsening headache. °Weakness or numbness in any part of your body, slurred speech, vision changes, or confusion. °Your coordination gets worse. °Vomiting repeatedly. °You have a seizure. °You have unusual behavior changes. °You lose consciousness, are sleepier than normal, or are difficult to wake up. °
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
## 2021-04-14 ENCOUNTER — Ambulatory Visit (INDEPENDENT_AMBULATORY_CARE_PROVIDER_SITE_OTHER): Payer: 59

## 2021-04-14 ENCOUNTER — Other Ambulatory Visit: Payer: Self-pay

## 2021-04-14 ENCOUNTER — Ambulatory Visit (INDEPENDENT_AMBULATORY_CARE_PROVIDER_SITE_OTHER): Payer: 59 | Admitting: Orthopaedic Surgery

## 2021-04-14 DIAGNOSIS — M79604 Pain in right leg: Secondary | ICD-10-CM

## 2021-04-14 DIAGNOSIS — R202 Paresthesia of skin: Secondary | ICD-10-CM | POA: Diagnosis not present

## 2021-04-14 DIAGNOSIS — R200 Anesthesia of skin: Secondary | ICD-10-CM
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
## 2021-04-25 ENCOUNTER — Telehealth: Payer: Self-pay

## 2021-04-25 NOTE — Telephone Encounter (Signed)
Pt has [facility] submitted form to plan, once authorized will send to [facility] schedule

## 2021-04-25 NOTE — Telephone Encounter (Signed)
Pt called into the office stating that the place he was sent to get a mri done does not accept his insurance and would like to know if we can submit elsewhere ??

## 2021-05-04 ENCOUNTER — Ambulatory Visit (INDEPENDENT_AMBULATORY_CARE_PROVIDER_SITE_OTHER): Payer: 59 | Admitting: Family Medicine

## 2021-05-04 ENCOUNTER — Other Ambulatory Visit (HOSPITAL_BASED_OUTPATIENT_CLINIC_OR_DEPARTMENT_OTHER): Payer: Self-pay

## 2021-05-04 ENCOUNTER — Encounter (HOSPITAL_BASED_OUTPATIENT_CLINIC_OR_DEPARTMENT_OTHER): Payer: Self-pay | Admitting: Family Medicine

## 2021-05-04 ENCOUNTER — Other Ambulatory Visit: Payer: Self-pay

## 2021-05-04 DIAGNOSIS — G4733 Obstructive sleep apnea (adult) (pediatric): Secondary | ICD-10-CM

## 2021-05-04 DIAGNOSIS — I10 Essential (primary) hypertension: Secondary | ICD-10-CM | POA: Diagnosis not present

## 2021-05-04 DIAGNOSIS — E119 Type 2 diabetes mellitus without complications: Secondary | ICD-10-CM

## 2021-05-04 DIAGNOSIS — F419 Anxiety disorder, unspecified: Secondary | ICD-10-CM

## 2021-05-04 DIAGNOSIS — G4730 Sleep apnea, unspecified: Secondary | ICD-10-CM | POA: Insufficient documentation

## 2021-05-04 MED ORDER — CARVEDILOL 6.25 MG PO TABS
6.2500 mg | ORAL_TABLET | Freq: Two times a day (BID) | ORAL | 1 refills | Status: DC
Start: 2021-05-04 — End: 2021-06-03
  Filled 2021-05-04: qty 9, 5d supply, fill #0
  Filled 2021-05-04: qty 51, 25d supply, fill #0

## 2021-05-04 MED ORDER — METFORMIN HCL 1000 MG PO TABS
1000.0000 mg | ORAL_TABLET | Freq: Two times a day (BID) | ORAL | 1 refills | Status: DC
  Filled 2021-05-04: qty 180, 90d supply, fill #0
  Filled 2021-07-12: qty 180, 90d supply, fill #1

## 2021-05-04 MED ORDER — GLIPIZIDE 5 MG PO TABS
5.0000 mg | ORAL_TABLET | Freq: Two times a day (BID) | ORAL | 1 refills | Status: DC
  Filled 2021-05-04: qty 60, 30d supply, fill #0
  Filled 2021-06-15: qty 60, 30d supply, fill #1

## 2021-05-04 MED ORDER — BUPROPION HCL ER (SR) 150 MG PO TB12
150.0000 mg | ORAL_TABLET | Freq: Two times a day (BID) | ORAL | 1 refills | Status: DC
Start: 2021-05-04 — End: 2021-08-16
  Filled 2021-05-04: qty 60, 30d supply, fill #0
  Filled 2021-06-28: qty 60, 30d supply, fill #1

## 2021-05-04 MED ORDER — AMLODIPINE BESYLATE 5 MG PO TABS
5.0000 mg | ORAL_TABLET | Freq: Every day | ORAL | 1 refills | Status: DC
  Filled 2021-05-04: qty 30, 30d supply, fill #0
  Filled 2021-06-28: qty 30, 30d supply, fill #1

## 2021-05-04 NOTE — Assessment & Plan Note (Signed)
Patient reports history of anxiety, on chart review also appears that there is some concern for bipolar disorder Current medications include Wellbutrin, BuSpar, Prozac, lithium, will continue with this for now He is requesting referral to behavioral health, will refer to [facility]

## 2021-05-04 NOTE — Assessment & Plan Note (Signed)
Reports good control with use of CPAP, however currently has broken mass DME order placed for patient to obtain new mask Patient aware of need for regular use of CPAP for adequate control of sleep apnea

## 2021-05-04 NOTE — Patient Instructions (Signed)
°  Medication Instructions:  Your physician recommends that you continue on your current medications as directed. Please refer to the Current Medication list given to you today. --If you need a refill on any your medications before your next appointment, please call your pharmacy first. If no refills are authorized on file call the office.-- Lab Work: Your physician has recommended that you have lab work today: CBC, CMP, Lipid, A1C, and TSH If you have labs (blood work) drawn today and your tests are completely normal, you will receive your results via MyChart message OR a phone call from our staff.  Please ensure you check your voicemail in the event that you authorized detailed messages to be left on a delegated number. If you have any lab test that is abnormal or we need to change your treatment, we will call you to review the results.  Referrals/Procedures/Imaging: A referral has been placed for you to [facility] for evaluation and treatment. Someone from the scheduling department will be in contact with you in regards to coordinating your consultation. If you do not hear from any of the schedulers within 7-10 business days please give their office a call.  [facility]  [address], [place], [place] [address] Phone: [phone]  Follow-Up: Your next appointment:   Your physician recommends that you schedule a follow-up appointment in: 3-4 WEEKS with Dr. de [place]  You will receive a text message or e-mail with a link to a survey about your care and experience with [place] today! We would greatly appreciate your feedback!   Thanks for letting us be apart of your health journey!!  Primary Care and Sports Medicine   Dr. Ceasar Mons [place]   We encourage you to activate your patient portal called "MyChart".  Sign up information is provided on this After Visit Summary.  MyChart is used to connect with patients for Virtual Visits  (Telemedicine).  Patients are able to view lab/test results, encounter notes, upcoming appointments, etc.  Non-urgent messages can be sent to your provider as well. To learn more about what you can do with MyChart, please visit --  [email or web address].

## 2021-05-04 NOTE — Assessment & Plan Note (Addendum)
Uncertain control, patient does not know most recent A1c, on chart review last A1c was 2019 and was elevated at 10.6% We will check A1c today Continue with glipizide and metformin at present, may need to make medication adjustments based on lab findings Patient likely candidate for GLP-1 receptor agonist, however may be cost prohibitive Plan for close follow-up Consider referral to nutritionist

## 2021-05-04 NOTE — Progress Notes (Incomplete)
New Patient Office Visit  Subjective:  Patient ID: Jordan Sparks, male    DOB: [birth year]  Age: 46 y.o. MRN: [id number]  CC:  Chief Complaint  Patient presents with   Establish Care    Prior PCP - Elbert Ewings. Also seeing Dr. Durward Fortes with [facility]   CPAP supplies    Patient currently uses [facility] for his DME for his CPAP supplies. He has broken his mask and was told by his DME in order for him to receive a new mask without paying out of pocket he would need an order from PCP.   Referral    Patient states he has a sore on his tongue and would like to be referred to an ENT. He states he has difficulty when eating certain foods, his tongue with burn.     HPI Jordan Sparks is a   OSA: On CPAP, however mask is broken and patient is needing new mask - reports needing DME order for mask to be covered by insurance. Reports good control of symptoms when able to use CPAP regularly.    Past Medical History:  Diagnosis Date   Anxiety    Depression    Hypertension    Manic disorder ([place])     Past Surgical History:  Procedure Laterality Date   Dental procedure      Family History  Problem Relation Age of Onset   Seizures Brother     Social History   Socioeconomic History   Marital status: Married    Spouse name: Not on file   Number of children: Not on file   Years of education: Not on file   Highest education level: Not on file  Occupational History   Not on file  Tobacco Use   Smoking status: Every Day    Packs/day: 0.25    Types: Cigarettes   Smokeless tobacco: Never  Substance and Sexual Activity   Alcohol use: No   Drug use: Yes    Types: Marijuana   Sexual activity: Not on file  Other Topics Concern   Not on file  Social History Narrative   Not on file   Social Determinants of Health   Financial Resource Strain: Not on file  Food Insecurity: Not on file  Transportation Needs: Not on file  Physical  Activity: Not on file  Stress: Not on file  Social Connections: Not on file  Intimate Partner Violence: Not on file    Objective:   Today's Vitals: BP (!) 172/124    Pulse (!) 116    Ht _0  (1.778 m)    Wt (!) 308 lb (139.7 kg)    SpO2 96%    BMI 44.19 kg/m   Physical Exam  45  Assessment & Plan:   Problem List Items Addressed This Visit       Cardiovascular and Mediastinum   Hypertension - Primary    Not at goal in office today, only taking carvedilol at present due to having cough with losartan We will continue with carvedilol and add amlodipine 5 mg Recommend close blood pressure monitoring at home, DASH diet, regular aerobic exercise      Relevant Medications   amLODipine (NORVASC) 5 MG tablet     Respiratory   Sleep apnea    Reports good control with use of CPAP, however currently has broken mass DME order placed for patient to obtain new mask Patient aware of need for regular use of CPAP for adequate control of sleep apnea  Endocrine   Diabetes mellitus ([place])    Uncertain control, patient does not know most recent A1c, on chart review last A1c was 2019 and was elevated at 10.6% We will check A1c today Continue with glipizide and metformin at present, may need to make medication adjustments based on lab findings Patient likely candidate for GLP-1 receptor agonist, however may be cost prohibitive Plan for close follow-up Consider referral to nutritionist      Relevant Medications   glipiZIDE (GLUCOTROL) 5 MG tablet   metFORMIN (GLUCOPHAGE) 1000 MG tablet     Other   Anxiety    Patient reports history of anxiety, on chart review also appears that there is some concern for bipolar disorder Current medications include Wellbutrin, BuSpar, Prozac, lithium, will continue with this for now He is requesting referral to behavioral health, will refer to [facility]      Relevant Medications   buPROPion (WELLBUTRIN SR) 150 MG 12 hr tablet    Outpatient  Encounter Medications as of 05/04/2021  Medication Sig   amLODipine (NORVASC) 5 MG tablet Take 1 tablet (5 mg total) by mouth daily.   blood glucose meter kit and supplies KIT Dispense based on patient and insurance preference. Use up to four times daily as directed. (FOR ICD-9 250.00, 250.01).   busPIRone (BUSPAR) 15 MG tablet Take 15 mg by mouth 3 (three) times daily as needed (anxiety).   carvedilol (COREG) 6.25 MG tablet Take 6.25 mg by mouth 2 (two) times daily.    celecoxib (CELEBREX) 200 MG capsule Take 1 capsule (200 mg total) by mouth 2 (two) times daily.   cetirizine (ZYRTEC) 10 MG tablet Take 10 mg by mouth daily.   Cholecalciferol (VITAMIN D) 50 MCG (2000 UT) CAPS Take 2,000 Units by mouth daily.   cloNIDine (CATAPRES) 0.1 MG tablet Take 0.2 mg by mouth 2 (two) times daily. Take 2 tablets (0.2 mg) BID   FLUoxetine (PROZAC) 20 MG capsule Take 20 mg by mouth daily.   fluticasone (FLONASE) 50 MCG/ACT nasal spray Place 2 sprays into both nostrils daily.   furosemide (LASIX) 20 MG tablet Take 20 mg by mouth daily.   gabapentin (NEURONTIN) 300 MG capsule Take 300 mg by mouth 2 (two) times daily.   guaiFENesin (MUCINEX) 600 MG 12 hr tablet Take 1 tablet (600 mg total) by mouth 2 (two) times daily.   ibuprofen (ADVIL,MOTRIN) 200 MG tablet Take 400 mg by mouth every 6 (six) hours as needed for headache (pain).   lithium carbonate 300 MG capsule Take 300 mg by mouth 2 (two) times daily.   meclizine (ANTIVERT) 25 MG tablet Take 1 tablet (25 mg total) by mouth 3 (three) times daily as needed for dizziness.   meloxicam (MOBIC) 7.5 MG tablet Take 7.5 mg by mouth daily as needed for pain.   Multiple Vitamin (MULTIVITAMIN WITH MINERALS) TABS tablet Take 1 tablet by mouth daily.   traZODone (DESYREL) 100 MG tablet Take 50 mg by mouth at bedtime as needed for sleep.   Vitamin D, Ergocalciferol, (DRISDOL) 1.25 MG (50000 UNIT) CAPS capsule Take 50,000 Units by mouth once a week.    [DISCONTINUED] buPROPion (WELLBUTRIN SR) 150 MG 12 hr tablet Take 150 mg by mouth 2 (two) times daily.   [DISCONTINUED] glipiZIDE (GLUCOTROL) 5 MG tablet Take 5 mg by mouth 2 (two) times daily.   [DISCONTINUED] losartan (COZAAR) 100 MG tablet Take 100 mg by mouth daily.   [DISCONTINUED] metFORMIN (GLUCOPHAGE) 1000 MG tablet Take 1,000 mg by mouth  2 (two) times daily.   buPROPion (WELLBUTRIN SR) 150 MG 12 hr tablet Take 1 tablet (150 mg total) by mouth 2 (two) times daily.   glipiZIDE (GLUCOTROL) 5 MG tablet Take 1 tablet (5 mg total) by mouth 2 (two) times daily.   metFORMIN (GLUCOPHAGE) 1000 MG tablet Take 1 tablet (1,000 mg total) by mouth 2 (two) times daily.   omeprazole (PRILOSEC) 20 MG capsule Take 1 capsule (20 mg total) by mouth 2 (two) times daily before a meal for 10 days.   sucralfate (CARAFATE) 1 g tablet Take 1 tablet (1 g total) by mouth 4 (four) times daily -  with meals and at bedtime for 7 days.   [DISCONTINUED] azithromycin (ZITHROMAX) 250 MG tablet Take 1 tablet (250 mg total) by mouth daily. Take first 2 tablets together, then 1 every day until finished. (Patient not taking: No sig reported)   [DISCONTINUED] glipiZIDE (GLUCOTROL XL) 2.5 MG 24 hr tablet Take 1 tablet (2.5 mg total) by mouth daily with breakfast.   [DISCONTINUED] metFORMIN (GLUCOPHAGE) 500 MG tablet Take 1 tablet (500 mg total) by mouth 2 (two) times daily with a meal.   [DISCONTINUED] ondansetron (ZOFRAN ODT) 4 MG disintegrating tablet Take 1-2 tablets (4-8 mg total) by mouth every 8 (eight) hours as needed for nausea or vomiting. (Patient not taking: No sig reported)   [DISCONTINUED] ondansetron (ZOFRAN) 4 MG tablet Take 1 tablet (4 mg total) by mouth every 6 (six) hours as needed for nausea. (Patient not taking: No sig reported)   No facility-administered encounter medications on file as of 05/04/2021.    Follow-up: No follow-ups on file.  Plan for follow-up in about 3 to 4 weeks or sooner as  needed.  Will review labs, discuss any needed medication changes or further evaluation at that time  Journei [place]assen J [place], MD

## 2021-05-04 NOTE — Assessment & Plan Note (Signed)
Not at goal in office today, only taking carvedilol at present due to having cough with losartan We will continue with carvedilol and add amlodipine 5 mg Recommend close blood pressure monitoring at home, DASH diet, regular aerobic exercise

## 2021-05-05 ENCOUNTER — Other Ambulatory Visit (HOSPITAL_BASED_OUTPATIENT_CLINIC_OR_DEPARTMENT_OTHER): Payer: Self-pay

## 2021-05-05 LAB — COMPREHENSIVE METABOLIC PANEL
ALT: 46 IU/L — ABNORMAL HIGH (ref 0–44)
AST: 27 IU/L (ref 0–40)
Albumin/Globulin Ratio: 1.8 (ref 1.2–2.2)
Albumin: 4.7 g/dL (ref 4.0–5.0)
Alkaline Phosphatase: 95 IU/L (ref 44–121)
BUN/Creatinine Ratio: 11 (ref 9–20)
BUN: 11 mg/dL (ref 6–24)
Bilirubin Total: 0.6 mg/dL (ref 0.0–1.2)
CO2: 24 mmol/L (ref 20–29)
Calcium: 9.8 mg/dL (ref 8.7–10.2)
Chloride: 99 mmol/L (ref 96–106)
Creatinine, Ser: 0.99 mg/dL (ref 0.76–1.27)
Globulin, Total: 2.6 g/dL (ref 1.5–4.5)
Glucose: 164 mg/dL — ABNORMAL HIGH (ref 70–99)
Potassium: 4.1 mmol/L (ref 3.5–5.2)
Sodium: 140 mmol/L (ref 134–144)
Total Protein: 7.3 g/dL (ref 6.0–8.5)
eGFR: 97 mL/min/{1.73_m2} (ref >59)

## 2021-05-05 LAB — CBC WITH DIFFERENTIAL/PLATELET
Basophils Absolute: 0.1 10*3/uL (ref 0.0–0.2)
Basos: 1 %
EOS (ABSOLUTE): 0.3 10*3/uL (ref 0.0–0.4)
Eos: 3 %
Hematocrit: 45.2 % (ref 37.5–51.0)
Hemoglobin: 15 g/dL (ref 13.0–17.7)
Immature Grans (Abs): 0 10*3/uL (ref 0.0–0.1)
Immature Granulocytes: 0 %
Lymphocytes Absolute: 2 10*3/uL (ref 0.7–3.1)
Lymphs: 19 %
MCH: 26.6 pg (ref 26.6–33.0)
MCHC: 33.2 g/dL (ref 31.5–35.7)
MCV: 80 fL (ref 79–97)
Monocytes Absolute: 0.8 10*3/uL (ref 0.1–0.9)
Monocytes: 7 %
Neutrophils Absolute: 7.8 10*3/uL — ABNORMAL HIGH (ref 1.4–7.0)
Neutrophils: 70 %
Platelets: 296 10*3/uL (ref 150–450)
RBC: 5.63 x10E6/uL (ref 4.14–5.80)
RDW: 13.7 % (ref 11.6–15.4)
WBC: 11 10*3/uL — ABNORMAL HIGH (ref 3.4–10.8)

## 2021-05-05 LAB — HEMOGLOBIN A1C
Est. average glucose Bld gHb Est-mCnc: 174 mg/dL
Hgb A1c MFr Bld: 7.7 % — ABNORMAL HIGH (ref 4.8–5.6)

## 2021-05-05 LAB — LIPID PANEL
Chol/HDL Ratio: 3.5 ratio (ref 0.0–5.0)
Cholesterol, Total: 189 mg/dL (ref 100–199)
HDL: 54 mg/dL (ref >39)
LDL Chol Calc (NIH): 116 mg/dL — ABNORMAL HIGH (ref 0–99)
Triglycerides: 106 mg/dL (ref 0–149)
VLDL Cholesterol Cal: 19 mg/dL (ref 5–40)

## 2021-05-05 LAB — TSH RFX ON ABNORMAL TO FREE T4: TSH: 1.13 u[IU]/mL (ref 0.450–4.500)

## 2021-05-13 ENCOUNTER — Other Ambulatory Visit (HOSPITAL_BASED_OUTPATIENT_CLINIC_OR_DEPARTMENT_OTHER): Payer: Self-pay | Admitting: Family Medicine

## 2021-05-15 MED ORDER — GABAPENTIN 300 MG PO CAPS
300.0000 mg | ORAL_CAPSULE | Freq: Two times a day (BID) | ORAL | 0 refills | Status: DC
  Filled 2021-05-15: qty 60, 30d supply, fill #0

## 2021-05-15 MED ORDER — MELOXICAM 7.5 MG PO TABS
7.5000 mg | ORAL_TABLET | Freq: Every day | ORAL | 0 refills | Status: DC | PRN
  Filled 2021-05-15: qty 30, 30d supply, fill #0

## 2021-05-15 MED ORDER — FLUTICASONE PROPIONATE 50 MCG/ACT NA SUSP
2.0000 | Freq: Every day | NASAL | 0 refills | Status: DC
  Filled 2021-05-15: qty 16, 30d supply, fill #0

## 2021-05-15 MED ORDER — CLONIDINE HCL 0.1 MG PO TABS
0.2000 mg | ORAL_TABLET | Freq: Two times a day (BID) | ORAL | 0 refills | Status: DC
  Filled 2021-05-15: qty 60, 15d supply, fill #0

## 2021-05-15 MED ORDER — LITHIUM CARBONATE 300 MG PO CAPS
300.0000 mg | ORAL_CAPSULE | Freq: Two times a day (BID) | ORAL | 0 refills | Status: DC
  Filled 2021-05-15: qty 60, 30d supply, fill #0

## 2021-05-16 ENCOUNTER — Other Ambulatory Visit: Payer: Self-pay

## 2021-05-16 ENCOUNTER — Other Ambulatory Visit (HOSPITAL_BASED_OUTPATIENT_CLINIC_OR_DEPARTMENT_OTHER): Payer: Self-pay

## 2021-05-16 ENCOUNTER — Encounter (HOSPITAL_BASED_OUTPATIENT_CLINIC_OR_DEPARTMENT_OTHER): Payer: Self-pay

## 2021-05-16 ENCOUNTER — Ambulatory Visit (HOSPITAL_COMMUNITY)
Admission: RE | Admit: 2021-05-16 | Discharge: 2021-05-16 | Disposition: A | Discharge status: Home / Self Care | Payer: 59 | Source: Ambulatory Visit | Attending: Orthopaedic Surgery | Admitting: Orthopaedic Surgery

## 2021-05-16 DIAGNOSIS — M79604 Pain in right leg: Secondary | ICD-10-CM | POA: Insufficient documentation

## 2021-05-17 ENCOUNTER — Other Ambulatory Visit (HOSPITAL_BASED_OUTPATIENT_CLINIC_OR_DEPARTMENT_OTHER): Payer: Self-pay

## 2021-05-20 ENCOUNTER — Encounter: Payer: Self-pay | Admitting: Physical Medicine and Rehabilitation

## 2021-05-20 ENCOUNTER — Ambulatory Visit (INDEPENDENT_AMBULATORY_CARE_PROVIDER_SITE_OTHER): Payer: 59 | Admitting: Physical Medicine and Rehabilitation

## 2021-05-20 ENCOUNTER — Other Ambulatory Visit: Payer: Self-pay

## 2021-05-20 DIAGNOSIS — R202 Paresthesia of skin: Secondary | ICD-10-CM | POA: Diagnosis not present

## 2021-05-20 NOTE — Progress Notes (Signed)
Pt state he has pain in his right shoulder that travels down the his hand and all fingers. Pt state he feels pain, weakness, numbness and tingling in his arm and hand. Pt state he his pain, numbness and tingling in his back that travels down his right leg to his bog toe. Pt state he take pain meds to help ease his pain. Pt state he is right handed.  ? ?Numeric Pain Rating Scale and Functional Assessment ?Average Pain 10 ? ? ?In the last MONTH (on 0-10 scale) has pain interfered with the following? ? ?1. General activity like being  able to carry out your everyday physical activities such as walking, climbing stairs, carrying groceries, or moving a chair?  ?Rating(10) ? ? ? ? ?

## 2021-05-25 ENCOUNTER — Encounter: Payer: Self-pay | Admitting: Orthopaedic Surgery

## 2021-05-25 ENCOUNTER — Other Ambulatory Visit: Payer: Self-pay

## 2021-05-25 ENCOUNTER — Ambulatory Visit (INDEPENDENT_AMBULATORY_CARE_PROVIDER_SITE_OTHER): Payer: 59 | Admitting: Orthopaedic Surgery

## 2021-05-25 DIAGNOSIS — M79604 Pain in right leg: Secondary | ICD-10-CM | POA: Diagnosis not present

## 2021-05-25 NOTE — Procedures (Signed)
EMG & NCV Findings: ?Evaluation of the right median motor nerve showed prolonged distal onset latency (4.3 ms) and decreased conduction velocity (Elbow-Wrist, 49 m/s).  The right tibial motor and the right superficial fibular sensory nerves showed reduced amplitude (R1.5, R2.4 ?V).  The right median (across palm) sensory nerve showed prolonged distal peak latency (Wrist, 4.2 ms) and prolonged distal peak latency (Palm, 2.2 ms).  All remaining nerves (as indicated in the following tables) were within normal limits.   ? ?All examined muscles (as indicated in the following table) showed no evidence of electrical instability.   ? ?Impression: ?The above electrodiagnostic study is ABNORMAL and reveals evidence of a moderate right median nerve entrapment at the wrist (carpal tunnel syndrome) affecting sensory and motor components.  Clinical correlation is paramount. ? ?There is no significant electrodiagnostic evidence of any other focal nerve entrapment, brachial plexopathy, cervical radiculopathy, lumbosacral plexopathy, or lumbar radiculopathy in the right upper and lower limbs.  ? ? As you know, this particular electrodiagnostic study cannot rule out chemical radiculitis or sensory only radiculopathy. ? ?Recommendations: ?1.  Follow-up with referring physician. ?2.  Continue current management of symptoms. ? ?___________________________ ?Jordan Sparks FAAPMR ?Board Certified, Biomedical engineer of Physical Medicine and Rehabilitation ? ? ? ?Nerve Conduction Studies ?Anti Sensory Summary Table ? ? Stim Site NR Peak (ms) Norm Peak (ms) P-T Amp (?V) Norm P-T Amp Site1 Site2 Delta-P (ms) Dist (cm) Vel (m/s) Norm Vel (m/s)  ?Right Median Acr Palm Anti Sensory (2nd Digit)  30?C  ?Wrist    *4.2 <3.6 19.7 >10 Wrist Palm 2.0 0.0    ?Palm    *2.2 <2.0 4.6         ?Right Radial Anti Sensory (Base 1st Digit)  30.5?C  ?Wrist    2.0 <3.1 29.2  Wrist Base 1st Digit 2.0 0.0    ?Right Sup Fibular Anti Sensory (Ant Lat Mall)  29.2?C  ?14 cm     4.3 <4.4 *2.4 >5.0 14 cm Ant Lat Mall 4.3 14.0 33 >32  ?Right Sural Anti Sensory (Lat Mall)  29.2?C  ?Calf    4.0 <4.0 11.4 >5.0 Calf Lat Mall 4.0 14.0 35 >35  ?Right Ulnar Anti Sensory (5th Digit)  30.8?C  ?Wrist    3.2 <3.7 23.7 >15.0 Wrist 5th Digit 3.2 14.0 44 >38  ? ?Motor Summary Table ? ? Stim Site NR Onset (ms) Norm Onset (ms) O-P Amp (mV) Norm O-P Amp Site1 Site2 Delta-0 (ms) Dist (cm) Vel (m/s) Norm Vel (m/s)  ?Right Fibular Motor (Ext Dig Brev)  28.4?C  ?Ankle    4.2 <6.1 4.7 >2.5 B Fib Ankle 8.8 34.0 39 >38  ?B Fib    13.0  3.3  Poplt B Fib 1.9 10.0 53 >40  ?Poplt    14.9  3.9         ?Right Median Motor (Abd Poll Brev)  31.5?C  ?Wrist    *4.3 <4.2 9.8 >5 Elbow Wrist 4.9 24.0 *49 >50  ?Elbow    9.2  6.5         ?Right Tibial Motor (Abd Barnie Alderman)  29?C  ?Ankle    5.0 <6.1 *1.5 >3.0 Knee Ankle 8.9 49.0 55 >35  ?Knee    13.9  3.6         ?Right Ulnar Motor (Abd Dig Min)  31.6?C  ?Wrist    3.4 <4.2 10.4 >3 B Elbow Wrist 4.4 25.0 57 >53  ?B Elbow    7.8  10.8  A Elbow B Elbow 1.3 10.0 77 >53  ?A Elbow    9.1  10.7         ? ?EMG ? ? Side Muscle Nerve Root Ins Act Fibs Psw Amp Dur Poly Recrt Int Dennie Bible Comment  ?Right 1stDorInt Ulnar C8-T1 Nml Nml Nml Nml Nml 0 Nml Nml   ?Right Abd Poll Brev Median C8-T1 Nml Nml Nml Nml Nml 0 Nml Nml   ?Right ExtDigCom   Nml Nml Nml Nml Nml 0 Nml Nml   ?Right Triceps Radial C6-7-8 Nml Nml Nml Nml Nml 0 Nml Nml   ?Right Deltoid Axillary C5-6 Nml Nml Nml Nml Nml 0 Nml Nml   ?Right AntTibialis Dp Br Peron L4-5 Nml Nml Nml Nml Nml 0 Nml Nml   ?Right Fibularis Longus  Sup Br Peron L5-S1 Nml Nml Nml Nml Nml 0 Nml Nml   ?Right MedGastroc Tibial S1-2 Nml Nml Nml Nml Nml 0 Nml Nml   ?Right VastusMed Femoral L2-4 Nml Nml Nml Nml Nml 0 Nml Nml   ?Right BicepsFemS Sciatic L5-S1 Nml Nml Nml Nml Nml 0 Nml Nml   ? ? ?Nerve Conduction Studies ?Anti Sensory Left/Right Comparison ? ? Stim Site L Lat (ms) R Lat (ms) L-R Lat (ms) L Amp (?V) R Amp (?V) L-R Amp (%) Site1 Site2 L Vel (m/s) R Vel  (m/s) L-R Vel (m/s)  ?Median Acr Palm Anti Sensory (2nd Digit)  30?C  ?Wrist  *4.2   19.7  Wrist Palm     ?Palm  *2.2   4.6        ?Radial Anti Sensory (Base 1st Digit)  30.5?C  ?Wrist  2.0   29.2  Wrist Base 1st Digit     ?Sup Fibular Anti Sensory (Ant Lat Mall)  29.2?C  ?14 cm  4.3   *2.4  14 cm Ant Lat Mall  33   ?Sural Anti Sensory (Lat Mall)  29.2?C  ?Calf  4.0   11.4  Calf Lat Mall  35   ?Ulnar Anti Sensory (5th Digit)  30.8?C  ?Wrist  3.2   23.7  Wrist 5th Digit  44   ? ?Motor Left/Right Comparison ? ? Stim Site L Lat (ms) R Lat (ms) L-R Lat (ms) L Amp (mV) R Amp (mV) L-R Amp (%) Site1 Site2 L Vel (m/s) R Vel (m/s) L-R Vel (m/s)  ?Fibular Motor (Ext Dig Brev)  28.4?C  ?Ankle  4.2   4.7  B Fib Ankle  39   ?B Fib  13.0   3.3  Poplt B Fib  53   ?Poplt  14.9   3.9        ?Median Motor (Abd Poll Brev)  31.5?C  ?Wrist  *4.3   9.8  Elbow Wrist  *49   ?Elbow  9.2   6.5        ?Tibial Motor (Abd Barnie Alderman)  29?C  ?Ankle  5.0   *1.5  Knee Ankle  55   ?Knee  13.9   3.6        ?Ulnar Motor (Abd Dig Min)  31.6?C  ?Wrist  3.4   10.4  B Elbow Wrist  57   ?B Elbow  7.8   10.8  A Elbow B Elbow  77   ?A Elbow  9.1   10.7        ? ? ? ?Waveforms: ?    ? ?    ? ?    ? ? ?

## 2021-05-25 NOTE — Progress Notes (Signed)
? ?Office Visit Note ?  ?Patient: Jordan Sparks           ?Date of Birth: 07-05-1975           ?MRN: [id number] ?Visit Date: 05/25/2021 ?             ?Requested by: de [place], Raymond J, MD ?[phone] ?[place],  [place] [address] ?PCP: de [place], Raymond J, MD ? ? ?Assessment & Plan: ?Visit Diagnoses: Right hand numbness right lower extremity back pain and sensation changes ? ?Plan: Patient is a 46 year old gentleman with a history of right lower extremity pain lower back pain.  He also complains of his foot being cold and having change in sensation.  Also history of right hand numbness.  He was seen and evaluated by Dr. Alvester Morin where electrodiagnostic studies were done of the right upper and lower extremities.  Also MRI of his back was done came in to review those today.  Electrodiagnostic studies are consistent with carpal tunnel.  This is not a huge nuisance to him but it does bother him occasionally.  We will give him a splint to use as needed.  The right lower extremity EMG was essentially normal.  Findings most consistent with a from diabetic neuropathy.  We talked about increasing his gabapentin.  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  He may benefit from an epidural steroid injection.  We will refer him back to Dr. Alvester Morin for this. ? ?Follow-Up Instructions: No follow-ups on file.  ? ?Orders:  ?No orders of the defined types were placed in this encounter. ? ?No orders of the defined types were placed in this encounter. ? ? ? ? Procedures: ?No procedures performed ? ? ?Clinical Data: ?No additional findings. ? ? ?Subjective: ?No chief complaint on file. ?Patient presents today for follow up on his lower back. He had an MRI and is here today for those results. ? ? ? ?Review of Systems  ?All other systems reviewed and are negative. ? ? ?Objective: ?Vital Signs: There were no vitals taken for this visit. ? ?Physical Exam ?Constitutional:   ?   Appearance: Normal appearance.   ?Pulmonary:  ?   Effort: Pulmonary effort is normal.  ?Skin: ?   General: Skin is warm and dry.  ?Neurological:  ?   Mental Status: He is alert.  ? ? ?Ortho Exam ?espiratory effort. ?Patient appears appropriate pleasant to exam answers questions appropriately.  He does use a cane for ambulation but is walking without a limp. ?Right hand palpable radial pulse.  He opposes fingers without difficulty excellent intrinsic strength.  Hand is warm. ?No neck pain ?Right lower extremity.  Bilateral hips excellent range of motion without pain.  Examination demonstrates equivalent strength right to left 5/5 with dorsiflexion plantarflexion of his ankles extension flexion of his knees flexion of his hips.  He does state significant sensation changes circumferentially around the mid calf down into his foot.  The foot is warm with good capillary refill, though pulses are difficult to palpate noted onychomycosis.  No ulcers between the toes no skin breakdown ?Specialty Comments:  ?MRI LUMBAR SPINE WITHOUT CONTRAST ?  ?TECHNIQUE: ?Multiplanar, multisequence MR imaging of the lumbar spine was ?performed. No intravenous contrast was administered. ?  ?COMPARISON:  Lumbar spine radiographs 04/14/2021. CTA chest, ?abdomen, and pelvis 06/18/2020. ?  ?FINDINGS: ?The study is motion degraded, moderately so on the axial sequences. ?  ?Segmentation: Standard. ?  ?Alignment: Trace retrolisthesis of L4 on  L5. Straightening of the ?normal lumbar lordosis. ?  ?Vertebrae: No fracture, suspicious marrow lesion, or significant ?marrow edema. Mild chronic degenerative endplate changes at L5-S1. ?  ?Conus medullaris and cauda equina: Conus extends to the L1-2 level. ?Conus and cauda equina appear normal. ?  ?Paraspinal and other soft tissues: Unremarkable. ?  ?Disc levels: ?  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 ?  ?T12-L1 and L1-2: Negative. ?  ?L2-3: Minimal disc bulging without stenosis. ?  ?L3-4: Mild  disc bulging without significant stenosis. ?  ?L4-5: Mild disc desiccation. Circumferential disc bulging and mild ?facet hypertrophy result in mild spinal stenosis, mild bilateral ?lateral recess stenosis, and mild-to-moderate bilateral neural ?foraminal stenosis. ?  ?L5-S1: Disc desiccation. Disc bulging and mild facet hypertrophy ?result in mild right and moderate left neural foraminal stenosis ?without spinal stenosis. ?  ?IMPRESSION: ?1. Motion degraded examination. ?2. 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 ?  ?  ?Electronically Signed ?  By: Sebastian Ache M.D. ?  On: 05/16/2021 18:00 ? ?Imaging: ?No results found. ? ? ?PMFS History: ?Patient Active Problem List  ? Diagnosis Date Noted  ? Diabetes mellitus (HCC) 05/04/2021  ? Sleep apnea 05/04/2021  ? Hypertensive urgency 10/19/2020  ? GERD (gastroesophageal reflux disease) 02/09/2018  ? Hyperglycemia 02/08/2018  ? Atypical chest pain 02/08/2018  ? Hypokalemia 02/08/2018  ? Leukocytosis 02/08/2018  ? Intractable nausea and vomiting 02/08/2018  ? Depression   ? Hypertension   ? Anxiety   ? Hyperglycemia without ketosis   ? Manic disorder (HCC)   ? ?Past Medical History:  ?Diagnosis Date  ? Anxiety   ? Depression   ? Hypertension   ? Manic disorder (HCC)   ?  ?Family History  ?Problem Relation Age of Onset  ? Seizures Brother   ?  ?Past Surgical History:  ?Procedure Laterality Date  ? Dental procedure    ? ?Social History  ? ?Occupational History  ? Not on file  ?Tobacco Use  ? Smoking status: Every Day  ?  Packs/day: 0.25  ?  Types: Cigarettes  ? Smokeless tobacco: Never  ?Substance and Sexual Activity  ? Alcohol use: No  ? Drug use: Yes  ?  Types: Marijuana  ? Sexual activity: Not on file  ? ? ? ? ? ? ?
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
## 2021-05-31 ENCOUNTER — Emergency Department (HOSPITAL_COMMUNITY): Payer: 59

## 2021-05-31 ENCOUNTER — Other Ambulatory Visit: Payer: Self-pay

## 2021-05-31 ENCOUNTER — Inpatient Hospital Stay (HOSPITAL_COMMUNITY)
Admission: EM | Admit: 2021-05-31 | Discharge: 2021-06-03 | DRG: 291 | Disposition: A | Discharge status: Home / Self Care | Payer: 59 | Attending: Internal Medicine | Admitting: Internal Medicine

## 2021-05-31 ENCOUNTER — Encounter (HOSPITAL_COMMUNITY): Payer: Self-pay | Admitting: *Deleted

## 2021-05-31 DIAGNOSIS — I110 Hypertensive heart disease with heart failure: Principal | ICD-10-CM | POA: Diagnosis present

## 2021-05-31 DIAGNOSIS — E1165 Type 2 diabetes mellitus with hyperglycemia: Secondary | ICD-10-CM | POA: Diagnosis present

## 2021-05-31 DIAGNOSIS — I160 Hypertensive urgency: Secondary | ICD-10-CM | POA: Diagnosis not present

## 2021-05-31 DIAGNOSIS — F32A Depression, unspecified: Secondary | ICD-10-CM | POA: Diagnosis present

## 2021-05-31 DIAGNOSIS — I10 Essential (primary) hypertension: Secondary | ICD-10-CM | POA: Diagnosis present

## 2021-05-31 DIAGNOSIS — G4733 Obstructive sleep apnea (adult) (pediatric): Secondary | ICD-10-CM | POA: Diagnosis present

## 2021-05-31 DIAGNOSIS — F1490 Cocaine use, unspecified, uncomplicated: Secondary | ICD-10-CM | POA: Diagnosis present

## 2021-05-31 DIAGNOSIS — F419 Anxiety disorder, unspecified: Secondary | ICD-10-CM | POA: Diagnosis present

## 2021-05-31 DIAGNOSIS — E119 Type 2 diabetes mellitus without complications: Secondary | ICD-10-CM

## 2021-05-31 DIAGNOSIS — Z720 Tobacco use: Secondary | ICD-10-CM | POA: Diagnosis not present

## 2021-05-31 DIAGNOSIS — E66813 Obesity, class 3: Secondary | ICD-10-CM | POA: Diagnosis present

## 2021-05-31 DIAGNOSIS — R079 Chest pain, unspecified: Secondary | ICD-10-CM | POA: Diagnosis not present

## 2021-05-31 DIAGNOSIS — E871 Hypo-osmolality and hyponatremia: Secondary | ICD-10-CM | POA: Diagnosis present

## 2021-05-31 DIAGNOSIS — K219 Gastro-esophageal reflux disease without esophagitis: Secondary | ICD-10-CM

## 2021-05-31 DIAGNOSIS — Z87891 Personal history of nicotine dependence: Secondary | ICD-10-CM

## 2021-05-31 DIAGNOSIS — Z7984 Long term (current) use of oral hypoglycemic drugs: Secondary | ICD-10-CM

## 2021-05-31 DIAGNOSIS — R0789 Other chest pain: Secondary | ICD-10-CM | POA: Diagnosis present

## 2021-05-31 DIAGNOSIS — R0602 Shortness of breath: Secondary | ICD-10-CM | POA: Diagnosis not present

## 2021-05-31 DIAGNOSIS — I2510 Atherosclerotic heart disease of native coronary artery without angina pectoris: Secondary | ICD-10-CM | POA: Diagnosis present

## 2021-05-31 DIAGNOSIS — R778 Other specified abnormalities of plasma proteins: Secondary | ICD-10-CM

## 2021-05-31 DIAGNOSIS — F39 Unspecified mood [affective] disorder: Secondary | ICD-10-CM | POA: Diagnosis present

## 2021-05-31 DIAGNOSIS — I428 Other cardiomyopathies: Secondary | ICD-10-CM | POA: Diagnosis present

## 2021-05-31 DIAGNOSIS — Z7982 Long term (current) use of aspirin: Secondary | ICD-10-CM

## 2021-05-31 DIAGNOSIS — I5021 Acute systolic (congestive) heart failure: Secondary | ICD-10-CM | POA: Diagnosis present

## 2021-05-31 DIAGNOSIS — R0600 Dyspnea, unspecified: Principal | ICD-10-CM

## 2021-05-31 DIAGNOSIS — Z79899 Other long term (current) drug therapy: Secondary | ICD-10-CM

## 2021-05-31 DIAGNOSIS — Z6841 Body Mass Index (BMI) 40.0 and over, adult: Secondary | ICD-10-CM

## 2021-05-31 DIAGNOSIS — R072 Precordial pain: Secondary | ICD-10-CM | POA: Diagnosis present

## 2021-05-31 DIAGNOSIS — Z791 Long term (current) use of non-steroidal anti-inflammatories (NSAID): Secondary | ICD-10-CM

## 2021-05-31 DIAGNOSIS — G4730 Sleep apnea, unspecified: Secondary | ICD-10-CM | POA: Diagnosis present

## 2021-05-31 HISTORY — DX: Type 2 diabetes mellitus with other specified complication: E11.69

## 2021-05-31 HISTORY — DX: Obesity, unspecified: E66.9

## 2021-05-31 HISTORY — DX: Type 2 diabetes mellitus without complications: E11.9

## 2021-05-31 LAB — CBC WITH DIFFERENTIAL/PLATELET
Abs Immature Granulocytes: 0.04 10*3/uL (ref 0.00–0.07)
Basophils Absolute: 0 10*3/uL (ref 0.0–0.1)
Basophils Relative: 0 %
Eosinophils Absolute: 0.1 10*3/uL (ref 0.0–0.5)
Eosinophils Relative: 1 %
HCT: 42.6 % (ref 39.0–52.0)
Hemoglobin: 14.1 g/dL (ref 13.0–17.0)
Immature Granulocytes: 0 %
Lymphocytes Relative: 19 %
Lymphs Abs: 2.1 10*3/uL (ref 0.7–4.0)
MCH: 26.6 pg (ref 26.0–34.0)
MCHC: 33.1 g/dL (ref 30.0–36.0)
MCV: 80.2 fL (ref 80.0–100.0)
Monocytes Absolute: 0.8 10*3/uL (ref 0.1–1.0)
Monocytes Relative: 7 %
Neutro Abs: 7.8 10*3/uL — ABNORMAL HIGH (ref 1.7–7.7)
Neutrophils Relative %: 73 %
Platelets: 265 10*3/uL (ref 150–400)
RBC: 5.31 MIL/uL (ref 4.22–5.81)
RDW: 13.8 % (ref 11.5–15.5)
WBC: 10.9 10*3/uL — ABNORMAL HIGH (ref 4.0–10.5)
nRBC: 0 % (ref 0.0–0.2)

## 2021-05-31 LAB — BASIC METABOLIC PANEL
Anion gap: 8 (ref 5–15)
BUN: 12 mg/dL (ref 6–20)
CO2: 26 mmol/L (ref 22–32)
Calcium: 9 mg/dL (ref 8.9–10.3)
Chloride: 100 mmol/L (ref 98–111)
Creatinine, Ser: 1.01 mg/dL (ref 0.61–1.24)
GFR, Estimated: >60 mL/min (ref >60)
Glucose, Bld: 247 mg/dL — ABNORMAL HIGH (ref 70–99)
Potassium: 3.5 mmol/L (ref 3.5–5.1)
Sodium: 134 mmol/L — ABNORMAL LOW (ref 135–145)

## 2021-05-31 LAB — HIV ANTIBODY (ROUTINE TESTING W REFLEX): HIV Screen 4th Generation wRfx: NONREACTIVE

## 2021-05-31 LAB — TROPONIN I (HIGH SENSITIVITY)
Troponin I (High Sensitivity): 24 ng/L — ABNORMAL HIGH (ref <18)
Troponin I (High Sensitivity): 22 ng/L — ABNORMAL HIGH (ref <18)
Troponin I (High Sensitivity): 24 ng/L — ABNORMAL HIGH (ref <18)
Troponin I (High Sensitivity): 27 ng/L — ABNORMAL HIGH (ref <18)

## 2021-05-31 LAB — BRAIN NATRIURETIC PEPTIDE: B Natriuretic Peptide: 90.3 pg/mL (ref 0.0–100.0)

## 2021-05-31 LAB — GLUCOSE, CAPILLARY
Glucose-Capillary: 209 mg/dL — ABNORMAL HIGH (ref 70–99)
Glucose-Capillary: 220 mg/dL — ABNORMAL HIGH (ref 70–99)

## 2021-05-31 LAB — D-DIMER, QUANTITATIVE: D-Dimer, Quant: 0.77 ug/mL-FEU — ABNORMAL HIGH (ref 0.00–0.50)

## 2021-05-31 LAB — CBG MONITORING, ED
Glucose-Capillary: 178 mg/dL — ABNORMAL HIGH (ref 70–99)
Glucose-Capillary: 176 mg/dL — ABNORMAL HIGH (ref 70–99)

## 2021-05-31 MED ORDER — BUPROPION HCL ER (SR) 150 MG PO TB12
150.0000 mg | ORAL_TABLET | Freq: Two times a day (BID) | ORAL | Status: DC
  Administered 2021-05-31 – 2021-06-03 (×5): 150 mg via ORAL
  Filled 2021-05-31 (×6): qty 1

## 2021-05-31 MED ORDER — CLONIDINE HCL 0.2 MG PO TABS
0.2000 mg | ORAL_TABLET | Freq: Two times a day (BID) | ORAL | Status: DC
  Administered 2021-05-31 – 2021-06-02 (×4): 0.2 mg via ORAL
  Filled 2021-05-31 (×4): qty 1

## 2021-05-31 MED ORDER — CARVEDILOL 12.5 MG PO TABS
12.5000 mg | ORAL_TABLET | Freq: Two times a day (BID) | ORAL | Status: DC
  Administered 2021-05-31 – 2021-06-02 (×4): 12.5 mg via ORAL
  Filled 2021-05-31 (×4): qty 1

## 2021-05-31 MED ORDER — ASPIRIN 81 MG PO CHEW: 324.0000 mg | CHEWABLE_TABLET | Freq: Every day | ORAL | 0 refills | Status: DC

## 2021-05-31 MED ORDER — OXYCODONE HCL 5 MG PO TABS
5.0000 mg | ORAL_TABLET | ORAL | Status: DC | PRN
  Administered 2021-06-01 – 2021-06-02 (×2): 5 mg via ORAL
  Filled 2021-05-31 (×2): qty 1

## 2021-05-31 MED ORDER — CARVEDILOL 3.125 MG PO TABS: 6.2500 mg | ORAL_TABLET | Freq: Two times a day (BID) | ORAL | Status: DC

## 2021-05-31 MED ORDER — ONDANSETRON HCL 4 MG PO TABS: 4.0000 mg | ORAL_TABLET | Freq: Four times a day (QID) | ORAL | Status: DC | PRN

## 2021-05-31 MED ORDER — SODIUM CHLORIDE 0.9% FLUSH
3.0000 mL | Freq: Two times a day (BID) | INTRAVENOUS | Status: DC
  Administered 2021-05-31 – 2021-06-03 (×5): 3 mL via INTRAVENOUS

## 2021-05-31 MED ORDER — ONDANSETRON HCL 4 MG/2ML IJ SOLN: 4.0000 mg | Freq: Four times a day (QID) | INTRAMUSCULAR | Status: DC | PRN

## 2021-05-31 MED ORDER — GABAPENTIN 300 MG PO CAPS
300.0000 mg | ORAL_CAPSULE | Freq: Two times a day (BID) | ORAL | Status: DC
  Administered 2021-05-31 – 2021-06-03 (×6): 300 mg via ORAL
  Filled 2021-05-31 (×6): qty 1

## 2021-05-31 MED ORDER — DOCUSATE SODIUM 100 MG PO CAPS
100.0000 mg | ORAL_CAPSULE | Freq: Two times a day (BID) | ORAL | Status: DC
  Administered 2021-05-31 – 2021-06-03 (×5): 100 mg via ORAL
  Filled 2021-05-31 (×6): qty 1

## 2021-05-31 MED ORDER — POLYETHYLENE GLYCOL 3350 17 G PO PACK: 17.0000 g | PACK | Freq: Every day | ORAL | Status: DC | PRN

## 2021-05-31 MED ORDER — FLUTICASONE PROPIONATE 50 MCG/ACT NA SUSP
2.0000 | Freq: Every day | NASAL | Status: DC
  Administered 2021-05-31 – 2021-06-03 (×4): 2 via NASAL
  Filled 2021-05-31: qty 16

## 2021-05-31 MED ORDER — BUSPIRONE HCL 10 MG PO TABS
15.0000 mg | ORAL_TABLET | Freq: Three times a day (TID) | ORAL | Status: DC | PRN
  Administered 2021-06-01 – 2021-06-03 (×5): 15 mg via ORAL
  Filled 2021-05-31 (×5): qty 2

## 2021-05-31 MED ORDER — LITHIUM CARBONATE 300 MG PO CAPS
300.0000 mg | ORAL_CAPSULE | Freq: Two times a day (BID) | ORAL | Status: DC
Start: 2021-05-31 — End: 2021-06-03
  Administered 2021-05-31 – 2021-06-03 (×6): 300 mg via ORAL
  Filled 2021-05-31 (×7): qty 1

## 2021-05-31 MED ORDER — ENOXAPARIN SODIUM 40 MG/0.4ML IJ SOSY
40.0000 mg | PREFILLED_SYRINGE | INTRAMUSCULAR | Status: DC
  Administered 2021-05-31 – 2021-06-02 (×3): 40 mg via SUBCUTANEOUS
  Filled 2021-05-31 (×3): qty 0.4

## 2021-05-31 MED ORDER — INSULIN ASPART 100 UNIT/ML IJ SOLN
0.0000 [IU] | Freq: Three times a day (TID) | INTRAMUSCULAR | Status: DC
  Administered 2021-05-31: 5 [IU] via SUBCUTANEOUS
  Administered 2021-06-01: 8 [IU] via SUBCUTANEOUS
  Administered 2021-06-01: 5 [IU] via SUBCUTANEOUS
  Administered 2021-06-01 – 2021-06-02 (×3): 3 [IU] via SUBCUTANEOUS
  Administered 2021-06-02: 2 [IU] via SUBCUTANEOUS
  Administered 2021-06-03: 3 [IU] via SUBCUTANEOUS

## 2021-05-31 MED ORDER — NITROGLYCERIN IN D5W 200-5 MCG/ML-% IV SOLN
0.0000 ug/min | INTRAVENOUS | Status: DC
  Administered 2021-05-31: 5 ug/min via INTRAVENOUS
  Filled 2021-05-31: qty 250

## 2021-05-31 MED ORDER — IVABRADINE HCL 7.5 MG PO TABS
15.0000 mg | ORAL_TABLET | Freq: Once | ORAL | Status: AC
  Administered 2021-06-01: 15 mg via ORAL
  Filled 2021-05-31: qty 2

## 2021-05-31 MED ORDER — IOHEXOL 350 MG/ML SOLN
60.0000 mL | Freq: Once | INTRAVENOUS | Status: AC | PRN
  Administered 2021-05-31: 60 mL via INTRAVENOUS

## 2021-05-31 MED ORDER — FLUOXETINE HCL 20 MG PO CAPS
20.0000 mg | ORAL_CAPSULE | Freq: Every day | ORAL | Status: DC
  Administered 2021-05-31 – 2021-06-03 (×4): 20 mg via ORAL
  Filled 2021-05-31 (×4): qty 1

## 2021-05-31 MED ORDER — TRAZODONE HCL 50 MG PO TABS
50.0000 mg | ORAL_TABLET | Freq: Every evening | ORAL | Status: DC | PRN
  Filled 2021-05-31 (×3): qty 1

## 2021-05-31 MED ORDER — FUROSEMIDE 10 MG/ML IJ SOLN
40.0000 mg | Freq: Two times a day (BID) | INTRAMUSCULAR | Status: DC
  Administered 2021-05-31 – 2021-06-03 (×6): 40 mg via INTRAVENOUS
  Filled 2021-05-31 (×6): qty 4

## 2021-05-31 MED ORDER — ACETAMINOPHEN 650 MG RE SUPP: 650.0000 mg | Freq: Four times a day (QID) | RECTAL | Status: DC | PRN

## 2021-05-31 MED ORDER — INSULIN ASPART 100 UNIT/ML IJ SOLN
0.0000 [IU] | Freq: Every day | INTRAMUSCULAR | Status: DC
  Administered 2021-05-31: 2 [IU] via SUBCUTANEOUS

## 2021-05-31 MED ORDER — HYDRALAZINE HCL 20 MG/ML IJ SOLN
5.0000 mg | INTRAMUSCULAR | Status: DC | PRN
Start: 2021-05-31 — End: 2021-06-03
  Administered 2021-06-03: 5 mg via INTRAVENOUS
  Filled 2021-05-31: qty 1

## 2021-05-31 MED ORDER — BISACODYL 5 MG PO TBEC
5.0000 mg | DELAYED_RELEASE_TABLET | Freq: Every day | ORAL | Status: DC | PRN
  Filled 2021-05-31: qty 1

## 2021-05-31 MED ORDER — ASPIRIN EC 81 MG PO TBEC
81.0000 mg | DELAYED_RELEASE_TABLET | Freq: Every day | ORAL | Status: DC
  Administered 2021-05-31 – 2021-06-03 (×4): 81 mg via ORAL
  Filled 2021-05-31 (×4): qty 1

## 2021-05-31 MED ORDER — ACETAMINOPHEN 325 MG PO TABS
650.0000 mg | ORAL_TABLET | Freq: Four times a day (QID) | ORAL | Status: DC | PRN
Start: 2021-05-31 — End: 2021-06-03
  Administered 2021-05-31: 650 mg via ORAL
  Filled 2021-05-31: qty 2

## 2021-05-31 MED ORDER — METOPROLOL TARTRATE 25 MG PO TABS
100.0000 mg | ORAL_TABLET | Freq: Once | ORAL | Status: AC
Start: 2021-06-01 — End: 2021-06-01
  Administered 2021-06-01: 100 mg via ORAL
  Filled 2021-05-31: qty 4

## 2021-05-31 MED ORDER — AMLODIPINE BESYLATE 5 MG PO TABS
5.0000 mg | ORAL_TABLET | Freq: Every day | ORAL | Status: DC
  Administered 2021-05-31 – 2021-06-03 (×4): 5 mg via ORAL
  Filled 2021-05-31 (×4): qty 1

## 2021-05-31 MED ORDER — LORATADINE 10 MG PO TABS
10.0000 mg | ORAL_TABLET | Freq: Every day | ORAL | Status: DC
Start: 2021-05-31 — End: 2021-06-03
  Administered 2021-05-31 – 2021-06-03 (×4): 10 mg via ORAL
  Filled 2021-05-31 (×4): qty 1

## 2021-05-31 NOTE — ED Provider Notes (Signed)
?Jordan ?Provider Note ? ? ?CSN: [id number] ?Arrival date & time: 05/31/21  4098 ? ?  ? ?History ? ?Chief Complaint  ?Patient presents with  ? Shortness of Breath  ? ? ?Jordan Sparks is a 46 y.o. male. ? ?Patient with history of high blood pressure compliant with medications presents with gradually worsening dyspnea worse with lying flat and chest discomfort left-sided.  Today the chest pains been fairly constant.  Shortness of breath worse with past 4 days and woke him up from sleep last night.  Patient denies any blood clot history, recent surgeries, known heart failure or heart attack.  Patient did not have medications this morning is waiting in the waiting room.  No diaphoresis.  Mild exertional symptoms and more fatigue.  No recent stress test.  No fevers or significant cough.  No known weight gain however patient is overweight. ? ? ?HPI: A 46 year old patient with a history of treated diabetes, hypertension and obesity presents for evaluation of chest pain. Initial onset of pain was approximately 1-3 hours ago. The patient's chest pain is sharp and is not worse with exertion. The patient's chest pain is not middle- or left-sided, is not well-localized, is not described as heaviness/pressure/tightness and does not radiate to the arms/jaw/neck. The patient does not complain of nausea and denies diaphoresis. The patient has smoked in the past 90 days. The patient has no history of stroke, has no history of peripheral artery disease, has no relevant family history of coronary artery disease (first degree relative at less than age 76) and has no history of hypercholesterolemia.  ? ?Home Medications ?Prior to Admission medications   ?Medication Sig Start Date End Date Taking? Authorizing Provider  ?aspirin 81 MG chewable tablet Chew 4 tablets (324 mg total) by mouth daily. 05/31/21  Yes Elnora Morrison, MD  ?amLODipine (NORVASC) 5 MG tablet Take 1 tablet (5 mg total)  by mouth daily. 05/04/21   de [place], Raymond J, MD  ?blood glucose meter kit and supplies KIT Dispense based on patient and insurance preference. Use up to four times daily as directed. (FOR ICD-9 250.00, 250.01). 02/09/18   Manuella Ghazi, Pratik D, DO  ?buPROPion (WELLBUTRIN SR) 150 MG 12 hr tablet Take 1 tablet (150 mg total) by mouth 2 (two) times daily. 05/04/21 07/03/21  de [place], Raymond J, MD  ?busPIRone (BUSPAR) 15 MG tablet Take 15 mg by mouth 3 (three) times daily as needed (anxiety).    [provider]  ?carvedilol (COREG) 6.25 MG tablet Take 1 tablet (6.25 mg total) by mouth 2 (two) times daily. 05/04/21   de [place], Raymond J, MD  ?celecoxib (CELEBREX) 200 MG capsule Take 1 capsule (200 mg total) by mouth 2 (two) times daily. 11/20/20   Margarita Mail, PA-C  ?cetirizine (ZYRTEC) 10 MG tablet Take 10 mg by mouth daily.    [provider]  ?Cholecalciferol (VITAMIN D) 50 MCG (2000 UT) CAPS Take 2,000 Units by mouth daily.    [provider]  ?cloNIDine (CATAPRES) 0.1 MG tablet Take 2 tablets (0.2 mg total) by mouth 2 (two) times daily. 05/15/21   de [place], Raymond J, MD  ?FLUoxetine (PROZAC) 20 MG capsule Take 20 mg by mouth daily.    [provider]  ?fluticasone (FLONASE) 50 MCG/ACT nasal spray Place 2 sprays into both nostrils daily. 05/15/21   de [place], Raymond J, MD  ?furosemide (LASIX) 20 MG tablet Take 20 mg by mouth daily.    [provider]  ?  gabapentin (NEURONTIN) 300 MG capsule Take 1 capsule (300 mg total) by mouth 2 (two) times daily. 05/15/21   de [place], Blondell Reveal, MD  ?glipiZIDE (GLUCOTROL) 5 MG tablet Take 1 tablet (5 mg total) by mouth 2 (two) times daily. 05/04/21   de [place], Raymond J, MD  ?guaiFENesin (MUCINEX) 600 MG 12 hr tablet Take 1 tablet (600 mg total) by mouth 2 (two) times daily. 01/23/20   Wieters, Hallie C, PA-C  ?ibuprofen (ADVIL,MOTRIN) 200 MG tablet Take 400 mg by mouth every 6 (six) hours as needed for headache (pain).    [provider]   ?lithium carbonate 300 MG capsule Take 1 capsule (300 mg total) by mouth 2 (two) times daily. 05/15/21   de [place], Raymond J, MD  ?meclizine (ANTIVERT) 25 MG tablet Take 1 tablet (25 mg total) by mouth 3 (three) times daily as needed for dizziness. 11/20/20   Margarita Mail, PA-C  ?meloxicam (MOBIC) 7.5 MG tablet Take 1 tablet (7.5 mg total) by mouth daily as needed for pain. 05/15/21   de [place], Raymond J, MD  ?metFORMIN (GLUCOPHAGE) 1000 MG tablet Take 1 tablet (1,000 mg total) by mouth 2 (two) times daily. 05/04/21   de [place], Raymond J, MD  ?Multiple Vitamin (MULTIVITAMIN WITH MINERALS) TABS tablet Take 1 tablet by mouth daily.    [provider]  ?omeprazole (PRILOSEC) 20 MG capsule Take 1 capsule (20 mg total) by mouth 2 (two) times daily before a meal for 10 days. 06/18/20 06/28/20  Dorie Rank, MD  ?sucralfate (CARAFATE) 1 g tablet Take 1 tablet (1 g total) by mouth 4 (four) times daily -  with meals and at bedtime for 7 days. 06/18/20 06/25/20  Dorie Rank, MD  ?traZODone (DESYREL) 100 MG tablet Take 50 mg by mouth at bedtime as needed for sleep. 09/15/20   [provider]  ?Vitamin D, Ergocalciferol, (DRISDOL) 1.25 MG (50000 UNIT) CAPS capsule Take 50,000 Units by mouth once a week. 07/27/20   [provider]  ?   ? ?Allergies    ?Patient has no known allergies.   ? ?Review of Systems   ?Review of Systems  ?Constitutional:  Positive for fatigue. Negative for chills and fever.  ?HENT:  Negative for congestion.   ?Eyes:  Negative for visual disturbance.  ?Respiratory:  Positive for chest tightness and shortness of breath.   ?Cardiovascular:  Positive for chest pain.  ?Gastrointestinal:  Negative for abdominal pain and vomiting.  ?Genitourinary:  Negative for dysuria and flank pain.  ?Musculoskeletal:  Negative for back pain, neck pain and neck stiffness.  ?Skin:  Negative for rash.  ?Neurological:  Negative for light-headedness and headaches.  ? ?Physical Exam ?Updated Vital Signs ?BP (!) 166/120    Pulse 99   Temp 98.4 ?F (36.9 ?C) (Oral)   Resp 20   SpO2 98%  ?Physical Exam ?Vitals and nursing note reviewed.  ?Constitutional:   ?   General: He is not in acute distress. ?   Appearance: He is well-developed.  ?HENT:  ?   Head: Normocephalic and atraumatic.  ?   Mouth/Throat:  ?   Mouth: Mucous membranes are moist.  ?Eyes:  ?   General:     ?   Right eye: No discharge.     ?   Left eye: No discharge.  ?   Conjunctiva/sclera: Conjunctivae normal.  ?Neck:  ?   Trachea: No tracheal deviation.  ?Cardiovascular:  ?   Rate and Rhythm: Regular rhythm. Tachycardia present.  ?  Heart sounds: No murmur heard. ?Pulmonary:  ?   Effort: Pulmonary effort is normal.  ?   Breath sounds: Examination of the right-lower field reveals rhonchi. Examination of the left-lower field reveals rhonchi. Rhonchi present.  ?Abdominal:  ?   General: There is no distension.  ?   Palpations: Abdomen is soft.  ?   Tenderness: There is no abdominal tenderness. There is no guarding.  ?Musculoskeletal:  ?   Cervical back: Normal range of motion and neck supple. No rigidity.  ?   Right lower leg: No edema.  ?   Left lower leg: No edema.  ?Skin: ?   General: Skin is warm.  ?   Capillary Refill: Capillary refill takes less than 2 seconds.  ?   Findings: No rash.  ?Neurological:  ?   General: No focal deficit present.  ?   Mental Status: He is alert.  ?   Cranial Nerves: No cranial nerve deficit.  ?Psychiatric:     ?   Mood and Affect: Mood normal.  ? ? ?ED Results / Procedures / Treatments   ?Labs ?(all labs ordered are listed, but only abnormal results are displayed) ?Labs Reviewed  ?CBC WITH DIFFERENTIAL/PLATELET - Abnormal; Notable for the following components:  ?    Result Value  ? WBC 10.9 (*)   ? Neutro Abs 7.8 (*)   ? All other components within normal limits  ?BASIC METABOLIC PANEL - Abnormal; Notable for the following components:  ? Sodium 134 (*)   ? Glucose, Bld 247 (*)   ? All other components within normal limits  ?D-DIMER,  QUANTITATIVE - Abnormal; Notable for the following components:  ? D-Dimer, Quant 0.77 (*)   ? All other components within normal limits  ?TROPONIN I (HIGH SENSITIVITY) - Abnormal; Notable for the following components:  ?

## 2021-05-31 NOTE — Assessment & Plan Note (Signed)
-  Body mass index is 44.1 kg/m?Marland Kitchen.  ?-Weight loss should be encouraged ?-Outpatient PCP/bariatric medicine/bariatric surgery f/u encouraged ?

## 2021-05-31 NOTE — Assessment & Plan Note (Signed)
-  Continue his array of mood medications - Wellbutrin, buspirone, fluoxetine, lithium, trazodone ?

## 2021-05-31 NOTE — Assessment & Plan Note (Signed)
-  Recent A1c was 7.7, suboptimal control ?-hold Glucophage, glipizide ?-Cover with moderate-scale SSI ?

## 2021-05-31 NOTE — ED Triage Notes (Signed)
Pt from home c/o sob x 4 days with left sided chest pain ?

## 2021-05-31 NOTE — H&P (Addendum)
?History and Physical  ? ? ?PatientChasten Sparks [id number] DOB: Oct 18, 1975 ?DOA: 05/31/2021 ?DOS: the patient was seen and examined on 05/31/2021 ?PCP: [place], Blondell Reveal, MD  ?Patient coming from: Home - lives with wife and 44yo son; Donald Prose: Wife, [phone] ? ? ?Chief Complaint: Chest pain ? ?HPI: Jordan Sparks is a 46 y.o. male with medical history significant of mood d/o and HTN presenting with CP/SOB.  He reports SOB, fatigue. He was working out and doing well but then he started having chest discomfort substernal with radiation into the left axilla.  He sleeps with CPAP but has been waking up with PND.  Chest pain has been happening for about a month on and off, but almost nonstop the last week.  He has been feeling like there is a fluttering.  A week ago he was at the gym on the treadmill and he was quite SOB and had to stop.  He has kept a food diary and doesn't think it related to diabetes.  No cough.  +orthopnea.  He moved a kingsize mattress across [employer] and felt like he had climbed a mountain. ? ?He has diffuse subacute pains (L shoulder, R arm and leg, neck...) and has an appointment with pain management this week.  He has been taking various NSAIDs/COX-2 medications, most recently Mobic. ? ? ? ?ER Course:  Poorly controlled HTN, obesity. Here with CP/SOB, worse with exertion.  ?CHF.  BNP 90.  CXR with edema.  CT negative for PE.  Cardiology consulting. ? ? ? ? ?Review of Systems: As mentioned in the history of present illness. All other systems reviewed and are negative. ?Past Medical History:  ?Diagnosis Date  ? Anxiety   ? Depression   ? Diabetes mellitus type 2 in obese [facility])   ? Hypertension   ? Manic disorder ([place])   ? Obesity   ? ?Past Surgical History:  ?Procedure Laterality Date  ? Dental procedure    ? ?Social History:  reports that he quit smoking about 6 weeks ago. His smoking use included cigarettes. He smoked an average of .25 packs per day. He has never used  smokeless tobacco. He reports current alcohol use. He reports current drug use. Drug: Marijuana. ? ?No Known Allergies ? ?Family History  ?Problem Relation Age of Onset  ? Seizures Brother   ? Heart failure Neg Hx   ? ? ?Prior to Admission medications   ?Medication Sig Start Date End Date Taking? Authorizing Provider  ?aspirin 81 MG chewable tablet Chew 4 tablets (324 mg total) by mouth daily. 05/31/21  Yes Elnora Morrison, MD  ?amLODipine (NORVASC) 5 MG tablet Take 1 tablet (5 mg total) by mouth daily. 05/04/21   [place], Raymond J, MD  ?blood glucose meter kit and supplies KIT Dispense based on patient and insurance preference. Use up to four times daily as directed. (FOR ICD-9 250.00, 250.01). 02/09/18   Manuella Ghazi, Pratik D, DO  ?buPROPion (WELLBUTRIN SR) 150 MG 12 hr tablet Take 1 tablet (150 mg total) by mouth 2 (two) times daily. 05/04/21 07/03/21  [place], Raymond J, MD  ?busPIRone (BUSPAR) 15 MG tablet Take 15 mg by mouth 3 (three) times daily as needed (anxiety).    [provider]  ?carvedilol (COREG) 6.25 MG tablet Take 1 tablet (6.25 mg total) by mouth 2 (two) times daily. 05/04/21   [place], Raymond J, MD  ?celecoxib (CELEBREX) 200 MG capsule Take 1 capsule (200 mg total) by mouth 2 (two) times  daily. 11/20/20   Margarita Mail, PA-C  ?cetirizine (ZYRTEC) 10 MG tablet Take 10 mg by mouth daily.    [provider]  ?Cholecalciferol (VITAMIN D) 50 MCG (2000 UT) CAPS Take 2,000 Units by mouth daily.    [provider]  ?cloNIDine (CATAPRES) 0.1 MG tablet Take 2 tablets (0.2 mg total) by mouth 2 (two) times daily. 05/15/21   [place], Raymond J, MD  ?FLUoxetine (PROZAC) 20 MG capsule Take 20 mg by mouth daily.    [provider]  ?fluticasone (FLONASE) 50 MCG/ACT nasal spray Place 2 sprays into both nostrils daily. 05/15/21   [place], Raymond J, MD  ?furosemide (LASIX) 20 MG tablet Take 20 mg by mouth daily.    [provider]  ?gabapentin (NEURONTIN) 300 MG capsule Take 1  capsule (300 mg total) by mouth 2 (two) times daily. 05/15/21   [place], Blondell Reveal, MD  ?glipiZIDE (GLUCOTROL) 5 MG tablet Take 1 tablet (5 mg total) by mouth 2 (two) times daily. 05/04/21   [place], Raymond J, MD  ?guaiFENesin (MUCINEX) 600 MG 12 hr tablet Take 1 tablet (600 mg total) by mouth 2 (two) times daily. 01/23/20   Wieters, Hallie C, PA-C  ?ibuprofen (ADVIL,MOTRIN) 200 MG tablet Take 400 mg by mouth every 6 (six) hours as needed for headache (pain).    [provider]  ?lithium carbonate 300 MG capsule Take 1 capsule (300 mg total) by mouth 2 (two) times daily. 05/15/21   [place], Raymond J, MD  ?meclizine (ANTIVERT) 25 MG tablet Take 1 tablet (25 mg total) by mouth 3 (three) times daily as needed for dizziness. 11/20/20   Margarita Mail, PA-C  ?meloxicam (MOBIC) 7.5 MG tablet Take 1 tablet (7.5 mg total) by mouth daily as needed for pain. 05/15/21   [place], Raymond J, MD  ?metFORMIN (GLUCOPHAGE) 1000 MG tablet Take 1 tablet (1,000 mg total) by mouth 2 (two) times daily. 05/04/21   [place], Raymond J, MD  ?Multiple Vitamin (MULTIVITAMIN WITH MINERALS) TABS tablet Take 1 tablet by mouth daily.    [provider]  ?omeprazole (PRILOSEC) 20 MG capsule Take 1 capsule (20 mg total) by mouth 2 (two) times daily before a meal for 10 days. 06/18/20 06/28/20  Dorie Rank, MD  ?sucralfate (CARAFATE) 1 g tablet Take 1 tablet (1 g total) by mouth 4 (four) times daily -  with meals and at bedtime for 7 days. 06/18/20 06/25/20  Dorie Rank, MD  ?traZODone (DESYREL) 100 MG tablet Take 50 mg by mouth at bedtime as needed for sleep. 09/15/20   [provider]  ?Vitamin D, Ergocalciferol, (DRISDOL) 1.25 MG (50000 UNIT) CAPS capsule Take 50,000 Units by mouth once a week. 07/27/20   [provider]  ? ? ?Physical Exam: ?Vitals:  ? 05/31/21 1400 05/31/21 1615 05/31/21 1630 05/31/21 1700  ?BP: (!) 166/120 (!) 186/140 (!) 178/123 (!) 184/107  ?Pulse: 99 (!) 109 (!) 104   ?Resp: 20     ?Temp:    98.7 ?F (37.1  ?C)  ?TempSrc:    Oral  ?SpO2: 98% 98% 98% 98%  ?Weight:    (!) 139.4 kg  ?Height:    5' 10"  (1.778 m)  ? ?General:  Appears calm and comfortable and is in NAD ?Eyes:  PERRL, EOMI, normal lids, iris ?ENT:  grossly normal hearing, lips & tongue, mmm; appropriate dentition ?Neck:  no LAD, masses or thyromegaly ?Cardiovascular:  RR with mild tachycardia, no m/r/g. No  LE edema.  ?Respiratory:   CTA bilaterally with no wheezes/rales/rhonchi.  Normal respiratory effort. ?Abdomen:  soft, NT, ND ?Skin:  no rash or induration seen on limited exam ?Musculoskeletal:  grossly normal tone BUE/BLE, good ROM, no bony abnormality ?Lower extremity:  No LE edema.  Limited foot exam with no ulcerations.  2+ distal pulses. ?Psychiatric:  grossly normal mood and affect, speech fluent and appropriate, AOx3 ?Neurologic:  CN 2-12 grossly intact, moves all extremities in coordinated fashion ? ? ?Radiological Exams on Admission: ?Independently reviewed - see discussion in A/P where applicable ? ?DG Chest 2 View ? ?Result Date: 05/31/2021 ?CLINICAL DATA:  Chest pain and shortness of breath. EXAM: CHEST - 2 VIEW COMPARISON:  Portable chest 10/19/2020. FINDINGS: The heart is moderately enlarged. There is increased perihilar vascular congestion development of lower zonal interstitial edema and trace pleural effusions. Perihilar hazy opacities also seen in probably due to ground-glass edema, less likely pneumonitis. The remaining lungs are clear. There is a stable mediastinal configuration. Thoracic cage is intact. IMPRESSION: Cardiomegaly with increased vascular congestion and interstitial edema and development of trace pleural effusions. Perihilar haziness is probably due to ground-glass edema, less likely pneumonitis. Electronically Signed   By: Telford Nab M.D.   On: 05/31/2021 04:10  ? ?CT Angio Chest PE W and/or Wo Contrast ? ?Result Date: 05/31/2021 ?CLINICAL DATA:  Chest pain. EXAM: CT ANGIOGRAPHY CHEST WITH CONTRAST TECHNIQUE:  Multidetector CT imaging of the chest was performed using the standard protocol during bolus administration of intravenous contrast. Multiplanar CT image reconstructions and MIPs were obtained to evaluate the vascular ana

## 2021-05-31 NOTE — Assessment & Plan Note (Addendum)
-  Continue amlodipine, carvedilol, and clonidine ?-Will order renal US with dopplers ?

## 2021-05-31 NOTE — Assessment & Plan Note (Signed)
Continue CPAP.  

## 2021-05-31 NOTE — Assessment & Plan Note (Signed)
-  Patient with substernal chest pain that appears to have escalated in the last month ?-2/3 typical symptoms suggestive of atypical cardiac chest pain.  ?-Symptoms may be related to ischemia, but new onset CHF is also a consideration ?-CXR with apparent pulmonary edema. ?-Initial cardiac HS troponin minimally elevated with negative delta.  ?-EKG not indicative of acute ischemia.   ?-Will plan to place in observation status on telemetry to rule out ACS by overnight observation.  ?-Start ASA 81 mg daily ?-Cardiology consultation in AM ?-NPO after midnight for coronary CTA ?-Echo is also pending; will give Lasix 40 mg IV BID for now despite negative BNP ?

## 2021-05-31 NOTE — Consult Note (Addendum)
?Cardiology Consultation:  ? ?Patient ID: Jordan Sparks ?MRN: [id number]; DOB: [birth year] ? ?Admit date: 05/31/2021 ?Date of Consult: 05/31/2021 ? ?PCP:  [place], Raymond J, MD ?  ?[facility] Providers ?Cardiologist:  New ? ? ?Patient Profile:  ? ?Jordan Sparks is a 46 y.o. male with a hx of hypertension, diabetes mellitus, polysubstance abuse, sleep apnea on CPAP and lumbar stenosis who is being seen 05/31/2021 for the evaluation of chest pain and shortness of breath at the request of Dr. Jodi Mourning. ? ?History of Present Illness:  ? ?Mr. Sparks presented with multiple complaints.  He is dealing with shoulder and back pain.  Seen by orthopedic.  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  Most recently seen by orthopedic 3/15 for lower back and right lower extremity pain.  Also had right hand numbness.  His findings are consistent with carpal tunnel.  Right lower extremity EMG study was normal.  It was felt that his findings more consistent with diabetic neuropathy and gabapentin was increased. ? ?Now started to having shortness of breath mostly exertional for past 2 to 3 weeks.  Progressively worsened.  Also has intermittent sharp chest pain underneath his left breast.  Intermittent palpitation.  Severe left shoulder pain radiating to his neck.  Last night patient was unable to sleep.  Reports compliance with his CPAP and medications.  Reported exertional fatigue.  No syncope or dizziness. ? ?Reports 1 pack of tobacco smoking since age 26.  Social drinking.  Daily marijuana smoking.  Occasional cocaine use.  Denies prior cardiac history.  No family history of CAD.  No regular exercise due to severe back pain. ? ?BNP normal ?High-sensitivity troponin 24>>22 ?D-dimer elevated at 0.77.   ?Chest x-ray showed cardiomegaly with increased vascular congestion and incidental edema ?Most Recent BP 166/120 HR 90-100s ?CT angio of the chest: ?There is no  evidence of large central pulmonary embolus in the main ?pulmonary artery or the main portions of the left and right ?pulmonary arteries. However, due to limited opacification of the ?pulmonary arteries, smaller and more peripheral pulmonary emboli ?cannot be excluded on the basis of this exam. ? ?Past Medical History:  ?Diagnosis Date  ? Anxiety   ? Depression   ? Hypertension   ? Manic disorder ([facility])   ? ? ?Past Surgical History:  ?Procedure Laterality Date  ? Dental procedure    ? ?Inpatient Medications: ?Scheduled Meds: ? ?Continuous Infusions: ? ?PRN Meds: ? ?Allergies:   No Known Allergies ? ?Social History:   ?Social History  ? ?Socioeconomic History  ? Marital status: Married  ?  Spouse name: Not on file  ? Number of children: Not on file  ? Years of education: Not on file  ? Highest education level: Not on file  ?Occupational History  ? Not on file  ?Tobacco Use  ? Smoking status: Every Day  ?  Packs/day: 0.25  ?  Types: Cigarettes  ? Smokeless tobacco: Never  ?Substance and Sexual Activity  ? Alcohol use: No  ? Drug use: Yes  ?  Types: Marijuana  ? Sexual activity: Not on file  ?Other Topics Concern  ? Not on file  ?Social History Narrative  ? Not on file  ? ?Social Determinants of Health  ? ?Financial Resource Strain: Not on file  ?Food Insecurity: Not on file  ?Transportation Needs: Not on file  ?Physical Activity: Not on file  ?Stress: Not on file  ?Social Connections: Not on file  ?  Intimate Partner Violence: Not on file  ?  ?Family History:   ?Family History  ?Problem Relation Age of Onset  ? Seizures Brother   ?  ? ?ROS:  ?Please see the history of present illness.  ?All other ROS reviewed and negative.    ? ?Physical Exam/Data:  ? ?Vitals:  ? 05/31/21 1215 05/31/21 1245 05/31/21 1300 05/31/21 1400  ?BP: (!) 163/112 (!) 145/103 (!) 158/127 (!) 166/120  ?Pulse: 95 100 (!) 102 99  ?Resp:   20 20  ?Temp:      ?TempSrc:      ?SpO2: 99% 97% 97% 98%  ? ?No intake or output data in the 24 hours ending  05/31/21 1541 ?Last 3 Weights 05/04/2021 10/19/2020 02/08/2018  ?Weight (lbs) 308 lb 300 lb 273 lb 5.9 oz  ?Weight (kg) 139.708 kg 136.079 kg 124 kg  ?   ?There is no height or weight on file to calculate BMI.  ?General:  Well nourished, well developed, in no acute distress ?HEENT: normal ?Neck: no JVD ?Vascular: No carotid bruits; Distal pulses 2+ bilaterally ?Cardiac:  normal S1, S2; RRR; no murmur  ?Lungs:  clear to auscultation bilaterally, no wheezing, rhonchi or rales  ?Abd: soft, nontender, no hepatomegaly  ?Ext: Trace RLE edema ?Musculoskeletal:  No deformities, BUE and BLE strength normal and equal ?Skin: warm and dry  ?Neuro:  CNs 2-12 intact, no focal abnormalities noted ?Psych:  Normal affect  ? ?EKG:  The EKG was personally reviewed and demonstrates:  Sinus tachycardia at 105 ?Telemetry:  Telemetry was personally reviewed and demonstrates:  Sinus rhythm/tachycardia  ? ?Relevant CV Studies: ?As above ? ?Laboratory Data: ? ?High Sensitivity Troponin:   ?Recent Labs  ?Lab 05/31/21 ?0352 05/31/21 ?0605  ?TROPONINIHS 24* 22*  ?   ?Chemistry ?Recent Labs  ?Lab 05/31/21 ?0352  ?NA 134*  ?K 3.5  ?CL 100  ?CO2 26  ?GLUCOSE 247*  ?BUN 12  ?CREATININE 1.01  ?CALCIUM 9.0  ?GFRNONAA >60  ?ANIONGAP 8  ?  ? ?Hematology ?Recent Labs  ?Lab 05/31/21 ?0352  ?WBC 10.9*  ?RBC 5.31  ?HGB 14.1  ?HCT 42.6  ?MCV 80.2  ?MCH 26.6  ?MCHC 33.1  ?RDW 13.8  ?PLT 265  ? ?Thyroid No results for input(s): TSH, FREET4 in the last 168 hours.  ?BNP ?Recent Labs  ?Lab 05/31/21 ?0352  ?BNP 90.3  ?  ?DDimer  ?Recent Labs  ?Lab 05/31/21 ?1235  ?DDIMER 0.77*  ? ? ? ?Radiology/Studies:  ?DG Chest 2 View ? ?Result Date: 05/31/2021 ?CLINICAL DATA:  Chest pain and shortness of breath. EXAM: CHEST - 2 VIEW COMPARISON:  Portable chest 10/19/2020. FINDINGS: The heart is moderately enlarged. There is increased perihilar vascular congestion development of lower zonal interstitial edema and trace pleural effusions. Perihilar hazy opacities also seen in  probably due to ground-glass edema, less likely pneumonitis. The remaining lungs are clear. There is a stable mediastinal configuration. Thoracic cage is intact. IMPRESSION: Cardiomegaly with increased vascular congestion and interstitial edema and development of trace pleural effusions. Perihilar haziness is probably due to ground-glass edema, less likely pneumonitis. Electronically Signed   By: Almira Bar M.D.   On: 05/31/2021 04:10  ? ?CT Angio Chest PE W and/or Wo Contrast ? ?Result Date: 05/31/2021 ?CLINICAL DATA:  Chest pain. EXAM: CT ANGIOGRAPHY CHEST WITH CONTRAST TECHNIQUE: Multidetector CT imaging of the chest was performed using the standard protocol during bolus administration of intravenous contrast. Multiplanar CT image reconstructions and MIPs were obtained to evaluate the vascular  anatomy. RADIATION DOSE REDUCTION: This exam was performed according to the departmental dose-optimization program which includes automated exposure control, adjustment of the mA and/or kV according to patient size and/or use of iterative reconstruction technique. CONTRAST:  7mL OMNIPAQUE IOHEXOL 350 MG/ML SOLN COMPARISON:  June 18, 2020. FINDINGS: Cardiovascular: There is limited opacification of the pulmonary arteries. There is no evidence of large central pulmonary embolus in the main pulmonary artery or the main portions of the left and right pulmonary arteries. However, smaller and more peripheral pulmonary emboli cannot be excluded on the basis of this exam. Mild cardiomegaly is noted. No pericardial effusion. Mediastinum/Nodes: No enlarged mediastinal, hilar, or axillary lymph nodes. Thyroid gland, trachea, and esophagus demonstrate no significant findings. Lungs/Pleura: Lungs are clear. No pleural effusion or pneumothorax. Upper Abdomen: No acute abnormality. Musculoskeletal: No chest wall abnormality. No acute or significant osseous findings. Review of the MIP images confirms the above findings. IMPRESSION:  There is no evidence of large central pulmonary embolus in the main pulmonary artery or the main portions of the left and right pulmonary arteries. However, due to limited opacification of the pulmonary arteries, s

## 2021-05-31 NOTE — ED Provider Triage Note (Signed)
Emergency Medicine Provider Triage Evaluation Note ? ?Jordan Sparks , a 46 y.o. male  was evaluated in triage.  Pt complains of chest pain and SOB.  Ongoing x4 days, has PCP appt in the morning.  Denies cough/fever. ? ?Review of Systems  ?Positive: Chest pain, SOB ?Negative: fever ? ?Physical Exam  ?BP (!) 179/119 (BP Location: Right Arm)   Pulse (!) 103   Temp 97.8 ?F (36.6 ?C) (Oral)   Resp (!) 27   SpO2 99%  ?Gen:   Awake, no distress   ?Resp:  Normal effort  ?MSK:   Moves extremities without difficulty  ?Other:  No significant LE edema ? ?Medical Decision Making  ?Medically screening exam initiated at 3:42 AM.  Appropriate orders placed.  Jordan Sparks was informed that the remainder of the evaluation will be completed by another provider, this initial triage assessment does not replace that evaluation, and the importance of remaining in the ED until their evaluation is complete. ? ?Chest pain, SOB.  Hypertensive in triage.  EKG, labs, CXR. ?  ?Garlon Hatchet, PA-C ?05/31/21 0344 ? ?

## 2021-06-01 ENCOUNTER — Observation Stay (HOSPITAL_COMMUNITY): Payer: 59

## 2021-06-01 ENCOUNTER — Ambulatory Visit (HOSPITAL_BASED_OUTPATIENT_CLINIC_OR_DEPARTMENT_OTHER): Payer: 59 | Admitting: Family Medicine

## 2021-06-01 DIAGNOSIS — I5021 Acute systolic (congestive) heart failure: Secondary | ICD-10-CM

## 2021-06-01 DIAGNOSIS — E1165 Type 2 diabetes mellitus with hyperglycemia: Secondary | ICD-10-CM | POA: Diagnosis present

## 2021-06-01 DIAGNOSIS — I160 Hypertensive urgency: Secondary | ICD-10-CM

## 2021-06-01 DIAGNOSIS — R072 Precordial pain: Secondary | ICD-10-CM | POA: Diagnosis present

## 2021-06-01 DIAGNOSIS — F1490 Cocaine use, unspecified, uncomplicated: Secondary | ICD-10-CM | POA: Diagnosis present

## 2021-06-01 DIAGNOSIS — I110 Hypertensive heart disease with heart failure: Secondary | ICD-10-CM | POA: Diagnosis present

## 2021-06-01 DIAGNOSIS — Z79899 Other long term (current) drug therapy: Secondary | ICD-10-CM | POA: Diagnosis not present

## 2021-06-01 DIAGNOSIS — Z7984 Long term (current) use of oral hypoglycemic drugs: Secondary | ICD-10-CM | POA: Diagnosis not present

## 2021-06-01 DIAGNOSIS — G4733 Obstructive sleep apnea (adult) (pediatric): Secondary | ICD-10-CM | POA: Diagnosis present

## 2021-06-01 DIAGNOSIS — Z791 Long term (current) use of non-steroidal anti-inflammatories (NSAID): Secondary | ICD-10-CM | POA: Diagnosis not present

## 2021-06-01 DIAGNOSIS — I428 Other cardiomyopathies: Secondary | ICD-10-CM | POA: Diagnosis present

## 2021-06-01 DIAGNOSIS — R0789 Other chest pain: Secondary | ICD-10-CM | POA: Diagnosis present

## 2021-06-01 DIAGNOSIS — Z6841 Body Mass Index (BMI) 40.0 and over, adult: Secondary | ICD-10-CM | POA: Diagnosis not present

## 2021-06-01 DIAGNOSIS — Z87891 Personal history of nicotine dependence: Secondary | ICD-10-CM | POA: Diagnosis not present

## 2021-06-01 DIAGNOSIS — R079 Chest pain, unspecified: Secondary | ICD-10-CM | POA: Diagnosis not present

## 2021-06-01 DIAGNOSIS — F419 Anxiety disorder, unspecified: Secondary | ICD-10-CM | POA: Diagnosis present

## 2021-06-01 DIAGNOSIS — R0602 Shortness of breath: Secondary | ICD-10-CM | POA: Diagnosis present

## 2021-06-01 DIAGNOSIS — F32A Depression, unspecified: Secondary | ICD-10-CM | POA: Diagnosis present

## 2021-06-01 DIAGNOSIS — E871 Hypo-osmolality and hyponatremia: Secondary | ICD-10-CM | POA: Diagnosis present

## 2021-06-01 DIAGNOSIS — I2510 Atherosclerotic heart disease of native coronary artery without angina pectoris: Secondary | ICD-10-CM | POA: Diagnosis present

## 2021-06-01 DIAGNOSIS — Z7982 Long term (current) use of aspirin: Secondary | ICD-10-CM | POA: Diagnosis not present

## 2021-06-01 LAB — RAPID URINE DRUG SCREEN, HOSP PERFORMED
Amphetamines: NOT DETECTED
Barbiturates: NOT DETECTED
Benzodiazepines: NOT DETECTED
Cocaine: NOT DETECTED
Opiates: NOT DETECTED
Tetrahydrocannabinol: POSITIVE — AB

## 2021-06-01 LAB — ECHOCARDIOGRAM COMPLETE
AR max vel: 3.23 cm2
AV Area VTI: 3.18 cm2
AV Area mean vel: 2.95 cm2
AV Mean grad: 3 mmHg
AV Peak grad: 4.7 mmHg
Ao pk vel: 1.08 m/s
Area-P 1/2: 5.75 cm2
Height: 70 in
S' Lateral: 4.8 cm
Weight: 4851.2 oz

## 2021-06-01 LAB — GLUCOSE, CAPILLARY
Glucose-Capillary: 161 mg/dL — ABNORMAL HIGH (ref 70–99)
Glucose-Capillary: 203 mg/dL — ABNORMAL HIGH (ref 70–99)
Glucose-Capillary: 296 mg/dL — ABNORMAL HIGH (ref 70–99)
Glucose-Capillary: 178 mg/dL — ABNORMAL HIGH (ref 70–99)

## 2021-06-01 LAB — BASIC METABOLIC PANEL
Anion gap: 7 (ref 5–15)
BUN: 11 mg/dL (ref 6–20)
CO2: 31 mmol/L (ref 22–32)
Calcium: 9.1 mg/dL (ref 8.9–10.3)
Chloride: 100 mmol/L (ref 98–111)
Creatinine, Ser: 1 mg/dL (ref 0.61–1.24)
GFR, Estimated: >60 mL/min (ref >60)
Glucose, Bld: 212 mg/dL — ABNORMAL HIGH (ref 70–99)
Potassium: 3.2 mmol/L — ABNORMAL LOW (ref 3.5–5.1)
Sodium: 138 mmol/L (ref 135–145)

## 2021-06-01 LAB — CBC
HCT: 40.4 % (ref 39.0–52.0)
Hemoglobin: 13.5 g/dL (ref 13.0–17.0)
MCH: 26.5 pg (ref 26.0–34.0)
MCHC: 33.4 g/dL (ref 30.0–36.0)
MCV: 79.2 fL — ABNORMAL LOW (ref 80.0–100.0)
Platelets: 237 10*3/uL (ref 150–400)
RBC: 5.1 MIL/uL (ref 4.22–5.81)
RDW: 13.5 % (ref 11.5–15.5)
WBC: 8.4 10*3/uL (ref 4.0–10.5)
nRBC: 0 % (ref 0.0–0.2)

## 2021-06-01 MED ORDER — IOHEXOL 350 MG/ML SOLN
95.0000 mL | Freq: Once | INTRAVENOUS | Status: AC | PRN
  Administered 2021-06-01: 95 mL via INTRAVENOUS

## 2021-06-01 MED ORDER — ROSUVASTATIN CALCIUM 5 MG PO TABS
5.0000 mg | ORAL_TABLET | Freq: Every day | ORAL | Status: DC
  Administered 2021-06-01 – 2021-06-03 (×3): 5 mg via ORAL
  Filled 2021-06-01 (×3): qty 1

## 2021-06-01 MED ORDER — DAPAGLIFLOZIN PROPANEDIOL 10 MG PO TABS
10.0000 mg | ORAL_TABLET | Freq: Every day | ORAL | Status: DC
  Administered 2021-06-02 – 2021-06-03 (×2): 10 mg via ORAL
  Filled 2021-06-01 (×2): qty 1

## 2021-06-01 MED ORDER — POTASSIUM CHLORIDE CRYS ER 20 MEQ PO TBCR
40.0000 meq | EXTENDED_RELEASE_TABLET | Freq: Once | ORAL | Status: AC
  Administered 2021-06-01: 40 meq via ORAL
  Filled 2021-06-01: qty 2

## 2021-06-01 MED ORDER — NITROGLYCERIN 0.4 MG SL SUBL
SUBLINGUAL_TABLET | SUBLINGUAL | Status: AC
  Filled 2021-06-01: qty 2

## 2021-06-01 MED ORDER — METOPROLOL TARTRATE 5 MG/5ML IV SOLN
INTRAVENOUS | Status: DC
Start: 2021-06-01 — End: 2021-06-01
  Filled 2021-06-01: qty 10

## 2021-06-01 MED ORDER — SACUBITRIL-VALSARTAN 24-26 MG PO TABS
1.0000 | ORAL_TABLET | Freq: Two times a day (BID) | ORAL | Status: DC
  Administered 2021-06-01 – 2021-06-03 (×4): 1 via ORAL
  Filled 2021-06-01 (×4): qty 1

## 2021-06-01 MED ORDER — DAPAGLIFLOZIN PROPANEDIOL 10 MG PO TABS: 10.0000 mg | ORAL_TABLET | Freq: Every day | ORAL | Status: DC

## 2021-06-01 NOTE — Progress Notes (Signed)
OT Cancellation Note ? ?Patient Details ?Name: Jordan Sparks ?MRN: [id number] ?DOB: [birth year] ? ? ?Cancelled Treatment:    Reason Eval/Treat Not Completed: Medical issues which prohibited therapy (Pt BP assessed 168/122 and pt with c/o chest pain 6/10. RN in room and aware giving medications. Pt also awaiting further testing today. Will hold OT Ev until next available date.) ? ?Alm Bustard, OTR/L ?06/01/2021, 9:39 AM ?

## 2021-06-01 NOTE — Progress Notes (Signed)
Renal artery duplex has been completed.  ? ?Preliminary results in CV Proc.  ? ?Jordan Sparks Jordan Sparks ?06/01/2021 10:23 AM    ?

## 2021-06-01 NOTE — Progress Notes (Signed)
Heart Failure Navigator Progress Note ? ?Following this hospitalization to assess for HV TOC readiness.  ? ?EF 25-30% ?Plan to interview ?Patient having chest pain this am ?Waiting further testing.  ?

## 2021-06-01 NOTE — Progress Notes (Signed)
Initial Nutrition Assessment ? ?DOCUMENTATION CODES:  ? ?Morbid obesity ? ?INTERVENTION:  ? ?Diet education ? ?NUTRITION DIAGNOSIS:  ? ?Increased nutrient needs related to acute illness (Possible CHF) as evidenced by estimated needs. ? ?GOAL:  ? ?Patient will meet greater than or equal to 90% of their needs ? ?MONITOR:  ? ?PO intake, Labs, Weight trends ? ?REASON FOR ASSESSMENT:  ? ?Consult ?Other (Comment) (Nutritional Goals) ? ?ASSESSMENT:  ? ?46 y.o. male presented to the ED with chest pain and shortness of breath. PMH includes GERD, HTN, and T2DM. Pt admitted with chest pain with uncertain etiology.  ? ?Pt reports that his appetite was good at home. States that his intake was not impacted by shortness of breath. Pt reports that he loves broccoli and brussel sprouts. Reports that he eats fruits and vegetables as his snacks. Stated that if anything if fried, it is fried in olive oil. Pt reports that he typically sautes his food in a bit of olive oil. Pt reports that he has been keeping a food journal at home as well.  ? ?Pt reports that he has recently lost 5#, intentionally. Per EMR, pt has not had any weight loss within the past year.  ? ?Pt requested some education with low sodium. Discussed ways to lower sodium intake such as buying fresh/frozen fruits and vegetables, removing the salt shaker from the table. Pt also asked about carbohydrates and how to monitor those. Reviewed the "Plate Method for Diabetics" with pt. Discussed importance of having carbohydrates with every meal. Pt provided examples of meals and snacks and we talked through how to adjust to help with level. Provided snack examples to pt, talked about adding a protein in with our carbohydrate snacks.  ?Pt request that handouts be added to after visit summary. RD added. ? ?Pt with no other questions or concerns at this time.  ? ?Medications reviewed and include: Colace, Lasix, SSI 0-15 units TID + 0-5 units daily, Potassium Chloride ?Labs  reviewed: Potassium 3.2, Hgb A1c 7.7% (05/04/21), 24 hr CBG 161-220 ? ?Diet Order:   ?Diet Order   ? ?       ?  Diet Carb Modified Fluid consistency: Thin; Room service appropriate? Yes  Diet effective now       ?  ? ?  ?  ? ?  ? ?EDUCATION NEEDS:  ? ?Education needs have been addressed ? ?Skin:  Skin Assessment: Reviewed RN Assessment ? ?Last BM:  No Documentation ? ?Height:  ? ?Ht Readings from Last 1 Encounters:  ?05/31/21 5\' 10"  (1.778 m)  ? ? ?Weight:  ? ?Wt Readings from Last 1 Encounters:  ?06/01/21 (!) 137.5 kg  ? ? ?Ideal Body Weight:  75.5 kg ? ?BMI:  Body mass index is 43.5 kg/m?. ? ?Estimated Nutritional Needs:  ?Kcal:  2200-2400 ?Protein:  110-125 grams ?Fluid:  >/= 2.2 L ? ? ? ?06/03/21 RD, LDN ?Clinical Dietitian ?See [facility] for contact information.  ? ?

## 2021-06-01 NOTE — Progress Notes (Signed)
Echocardiogram ?2D Echocardiogram has been performed. ? ?Jordan Sparks ?06/01/2021, 8:34 AM ?

## 2021-06-01 NOTE — Progress Notes (Signed)
?PROGRESS NOTE ? ? ? ?Kaesyn McGregor-Shabazz  MCN:[id number] DOB: Nov 01, 1975 DOA: 05/31/2021 ?PCP: de [place], Raymond J, MD ? ? ?Brief Narrative:  ?Jordan Sparks is a 46 y.o. male with medical history significant of mood d/o and HTN presenting with CP/SOB.  He reports SOB, fatigue as well. He has diffuse subacute pains (L shoulder, R arm and leg, neck...) and has an appointment with pain management this week.  He has been taking various NSAIDs/COX-2 medications, most recently Mobic. ? ?Assessment & Plan: ?  ?Principal Problem: ?  Chest pain of uncertain etiology ?Active Problems: ?  Mood disorder (HCC) ?  Hypertension ?  Diabetes mellitus (HCC) ?  Sleep apnea ?  Obesity, Class III, BMI 40-49.9 (morbid obesity) (HCC) ? ? ?Chest pain of uncertain etiology, ACS unlikely ?-Subacute/somewhat chronic over the past month ?-CXR with apparent pulmonary edema. ?-Troponin low and downtrending; EKG not indicative of acute ischemia.   ?-Cardiology following -appreciate insight recommendations given new echo findings ?-NPO after midnight for coronary CTA (low risk) ?-Echo shows global hypokinesia this afternoon 25 to 30% ?  ?Obesity, Class III, BMI 40-49.9 (morbid obesity) (HCC) ?-Body mass index is 44.1 kg/m?Marland Kitchen.  ?-Weight loss should be encouraged ?-Outpatient PCP/bariatric medicine/bariatric surgery f/u encouraged ?  ?Sleep apnea ?-Continue CPAP ?  ?Diabetes mellitus (HCC) ?-Recent A1c was 7.7, suboptimal control ?-hold Glucophage, glipizide ?-Cover with moderate-scale SSI ?  ?Hypertension ?-Continue amlodipine, carvedilol, and clonidine ?-Will order renal US with dopplers ?  ?Mood disorder (HCC) ?-Continue his array of mood medications - Wellbutrin, buspirone, fluoxetine, lithium, trazodone ? ?DVT prophylaxis: Lovenox ?Code Status: Full ?Family Communication: Wife at bedside ? ?Status is: Inpatient ? ?Dispo: The patient is from: Home ?             Anticipated d/c is to: Home ?             Anticipated d/c date is: 24 to 48  hours ?             Patient currently not medically stable for discharge ? ?Consultants:  ?Cardiology ? ?Procedures:  ?None ? ?Antimicrobials:  ?None ? ?Subjective: ?No acute issues or events overnight, chest pain ongoing ? ?Objective: ?Vitals:  ? 05/31/21 2013 05/31/21 2111 06/01/21 0000 06/01/21 0458  ?BP: (!) 154/87  (!) 121/55 (!) 148/109  ?Pulse: (!) 104 (!) 101 92 92  ?Resp: 18 19 20 20   ?Temp: 98.8 ?F (37.1 ?C)  98.6 ?F (37 ?C) 97.8 ?F (36.6 ?C)  ?TempSrc: Oral  Oral Oral  ?SpO2: 93% 96% 96%   ?Weight:    (!) 137.5 kg  ?Height:      ? ? ?Intake/Output Summary (Last 24 hours) at 06/01/2021 0739 ?Last data filed at 05/31/2021 2340 ?Gross per 24 hour  ?Intake 489.07 ml  ?Output 500 ml  ?Net -10.93 ml  ? ?Filed Weights  ? 05/31/21 1700 06/01/21 0458  ?Weight: (!) 139.4 kg (!) 137.5 kg  ? ? ?Examination: ? ?General:  Pleasantly resting in bed, No acute distress. ?HEENT:  Normocephalic atraumatic.  Sclerae nonicteric, noninjected.  Extraocular movements intact bilaterally. ?Neck:  Without mass or deformity.  Trachea is midline. ?Lungs/chest:  Clear to auscultate bilaterally -exquisitely tender left lateral posteriorly. ?Heart:  Regular rate and rhythm.  Without murmurs, rubs, or gallops. ?Abdomen:  Soft, nontender, nondistended.  Without guarding or rebound. ?Extremities: Without cyanosis, clubbing, edema, or obvious deformity. ?Vascular:  Dorsalis pedis and posterior tibial pulses palpable bilaterally. ?Skin:  Warm and dry, no erythema, no ulcerations. ? ?Data  Reviewed: I have personally reviewed following labs and imaging studies ? ?CBC: ?Recent Labs  ?Lab 05/31/21 ?0352 06/01/21 ?9373  ?WBC 10.9* 8.4  ?NEUTROABS 7.8*  --   ?HGB 14.1 13.5  ?HCT 42.6 40.4  ?MCV 80.2 79.2*  ?PLT 265 237  ? ?Basic Metabolic Panel: ?Recent Labs  ?Lab 05/31/21 ?0352 06/01/21 ?4287  ?NA 134* 138  ?K 3.5 3.2*  ?CL 100 100  ?CO2 26 31  ?GLUCOSE 247* 212*  ?BUN 12 11  ?CREATININE 1.01 1.00  ?CALCIUM 9.0 9.1  ? ?GFR: ?Estimated Creatinine  Clearance: 133.1 mL/min (by C-G formula based on SCr of 1 mg/dL). ?Liver Function Tests: ?No results for input(s): AST, ALT, ALKPHOS, BILITOT, PROT, ALBUMIN in the last 168 hours. ?No results for input(s): LIPASE, AMYLASE in the last 168 hours. ?No results for input(s): AMMONIA in the last 168 hours. ?Coagulation Profile: ?No results for input(s): INR, PROTIME in the last 168 hours. ?Cardiac Enzymes: ?No results for input(s): CKTOTAL, CKMB, CKMBINDEX, TROPONINI in the last 168 hours. ?BNP (last 3 results) ?No results for input(s): PROBNP in the last 8760 hours. ?HbA1C: ?No results for input(s): HGBA1C in the last 72 hours. ?CBG: ?Recent Labs  ?Lab 05/31/21 ?1613 05/31/21 ?1703 05/31/21 ?1738 05/31/21 ?2102 06/01/21 ?0601  ?GLUCAP 178* 176* 209* 220* 161*  ? ?Lipid Profile: ?No results for input(s): CHOL, HDL, LDLCALC, TRIG, CHOLHDL, LDLDIRECT in the last 72 hours. ?Thyroid Function Tests: ?No results for input(s): TSH, T4TOTAL, FREET4, T3FREE, THYROIDAB in the last 72 hours. ?Anemia Panel: ?No results for input(s): VITAMINB12, FOLATE, FERRITIN, TIBC, IRON, RETICCTPCT in the last 72 hours. ?Sepsis Labs: ?No results for input(s): PROCALCITON, LATICACIDVEN in the last 168 hours. ? ?No results found for this or any previous visit (from the past 240 hour(s)).  ? ? ? ? ? ?Radiology Studies: ?DG Chest 2 View ? ?Result Date: 05/31/2021 ?CLINICAL DATA:  Chest pain and shortness of breath. EXAM: CHEST - 2 VIEW COMPARISON:  Portable chest 10/19/2020. FINDINGS: The heart is moderately enlarged. There is increased perihilar vascular congestion development of lower zonal interstitial edema and trace pleural effusions. Perihilar hazy opacities also seen in probably due to ground-glass edema, less likely pneumonitis. The remaining lungs are clear. There is a stable mediastinal configuration. Thoracic cage is intact. IMPRESSION: Cardiomegaly with increased vascular congestion and interstitial edema and development of trace pleural  effusions. Perihilar haziness is probably due to ground-glass edema, less likely pneumonitis. Electronically Signed   By: Almira Bar M.D.   On: 05/31/2021 04:10  ? ?CT Angio Chest PE W and/or Wo Contrast ? ?Result Date: 05/31/2021 ?CLINICAL DATA:  Chest pain. EXAM: CT ANGIOGRAPHY CHEST WITH CONTRAST TECHNIQUE: Multidetector CT imaging of the chest was performed using the standard protocol during bolus administration of intravenous contrast. Multiplanar CT image reconstructions and MIPs were obtained to evaluate the vascular anatomy. RADIATION DOSE REDUCTION: This exam was performed according to the departmental dose-optimization program which includes automated exposure control, adjustment of the mA and/or kV according to patient size and/or use of iterative reconstruction technique. CONTRAST:  35mL OMNIPAQUE IOHEXOL 350 MG/ML SOLN COMPARISON:  June 18, 2020. FINDINGS: Cardiovascular: There is limited opacification of the pulmonary arteries. There is no evidence of large central pulmonary embolus in the main pulmonary artery or the main portions of the left and right pulmonary arteries. However, smaller and more peripheral pulmonary emboli cannot be excluded on the basis of this exam. Mild cardiomegaly is noted. No pericardial effusion. Mediastinum/Nodes: No enlarged mediastinal, hilar, or axillary  lymph nodes. Thyroid gland, trachea, and esophagus demonstrate no significant findings. Lungs/Pleura: Lungs are clear. No pleural effusion or pneumothorax. Upper Abdomen: No acute abnormality. Musculoskeletal: No chest wall abnormality. No acute or significant osseous findings. Review of the MIP images confirms the above findings. IMPRESSION: There is no evidence of large central pulmonary embolus in the main pulmonary artery or the main portions of the left and right pulmonary arteries. However, due to limited opacification of the pulmonary arteries, smaller and more peripheral pulmonary emboli cannot be excluded on  the basis of this exam. Electronically Signed   By: Lupita Raider M.D.   On: 05/31/2021 14:41   ? ? ? ? ? ?Scheduled Meds: ? amLODipine  5 mg Oral Daily  ? aspirin EC  81 mg Oral Daily  ? buPROPion  150

## 2021-06-01 NOTE — Discharge Summary (Incomplete)
Physician Discharge Summary  ?Jordan Sparks HCW:[id number] DOB: [birth year] DOA: 05/31/2021 ? ?PCP: de [place], Raymond J, MD ? ?Admit date: 05/31/2021 ?Discharge date: 06/01/2021 ? ?Admitted From: *** ?Disposition:  *** ? ?Recommendations for Outpatient Follow-up:  ?Follow up with PCP in 1-2 weeks ?Please obtain BMP/CBC in one week ?Please follow up on the following pending results: ? ?Home Health:***  ?Equipment/Devices:*** ? ?Discharge Condition:***  ?CODE STATUS:***  ?Diet recommendation:   ? ?Brief/Interim Summary: ?*** ? ?Discharge Diagnoses:  ?Principal Problem: ?  Chest pain of uncertain etiology ?Active Problems: ?  Mood disorder (HCC) ?  Hypertension ?  Diabetes mellitus (HCC) ?  Sleep apnea ?  Obesity, Class III, BMI 40-49.9 (morbid obesity) (HCC) ? ? ? ?Discharge Instructions ? ? ?Allergies as of 06/01/2021   ?No Known Allergies ?  ?Med Rec must be completed prior to using this SMARTLINK*** ? ? ? ? ? ? Follow-up Information   ? ? de [place], Buren Kos, MD.   ?Specialty: Family Medicine ?Contact information: ?[address] ?[place] [address] ?[phone] ? ? ?  ?  ? ? [facility].   ?Contact information: ?[address] ?[place] [address] ?[phone] ? ?  ?  ? ?  ?  ? ?  ? ?No Known Allergies ? ?Consultations: ?***Specify Physician/Group ? ? ?Procedures/Studies: ?DG Chest 2 View ? ?Result Date: 05/31/2021 ?CLINICAL DATA:  Chest pain and shortness of breath. EXAM: CHEST - 2 VIEW COMPARISON:  Portable chest 10/19/2020. FINDINGS: The heart is moderately enlarged. There is increased perihilar vascular congestion development of lower zonal interstitial edema and trace pleural effusions. Perihilar hazy opacities also seen in probably due to ground-glass edema, less likely pneumonitis. The remaining lungs are clear. There is a stable mediastinal configuration. Thoracic cage is intact. IMPRESSION: Cardiomegaly with increased  vascular congestion and interstitial edema and development of trace pleural effusions. Perihilar haziness is probably due to ground-glass edema, less likely pneumonitis. Electronically Signed   By: Almira Bar M.D.   On: 05/31/2021 04:10  ? ?CT Angio Chest PE W and/or Wo Contrast ? ?Result Date: 05/31/2021 ?CLINICAL DATA:  Chest pain. EXAM: CT ANGIOGRAPHY CHEST WITH CONTRAST TECHNIQUE: Multidetector CT imaging of the chest was performed using the standard protocol during bolus administration of intravenous contrast. Multiplanar CT image reconstructions and MIPs were obtained to evaluate the vascular anatomy. RADIATION DOSE REDUCTION: This exam was performed according to the departmental dose-optimization program which includes automated exposure control, adjustment of the mA and/or kV according to patient size and/or use of iterative reconstruction technique. CONTRAST:  37mL OMNIPAQUE IOHEXOL 350 MG/ML SOLN COMPARISON:  June 18, 2020. FINDINGS: Cardiovascular: There is limited opacification of the pulmonary arteries. There is no evidence of large central pulmonary embolus in the main pulmonary artery or the main portions of the left and right pulmonary arteries. However, smaller and more peripheral pulmonary emboli cannot be excluded on the basis of this exam. Mild cardiomegaly is noted. No pericardial effusion. Mediastinum/Nodes: No enlarged mediastinal, hilar, or axillary lymph nodes. Thyroid gland, trachea, and esophagus demonstrate no significant findings. Lungs/Pleura: Lungs are clear. No pleural effusion or pneumothorax. Upper Abdomen: No acute abnormality. Musculoskeletal: No chest wall abnormality. No acute or significant osseous findings. Review of the MIP images confirms the above findings. IMPRESSION: There is no evidence of large central pulmonary embolus in the main pulmonary artery or the main portions of the left and right pulmonary arteries. However, due to limited opacification of the pulmonary  arteries, smaller and more peripheral pulmonary emboli cannot be excluded on the basis of this exam. Electronically Signed   By: Lupita Raider M.D.   On: 05/31/2021 14:41  ? ?MR Lumbar Spine w/o contrast ? ?Result Date: 05/16/2021 ?CLINICAL DATA:  Lumbar spinal stenosis.  Right leg pain. EXAM: MRI LUMBAR SPINE WITHOUT CONTRAST TECHNIQUE: Multiplanar, multisequence MR imaging of the lumbar spine was performed. No intravenous contrast was administered. COMPARISON:  Lumbar spine radiographs 04/14/2021. CTA chest, abdomen, and pelvis 06/18/2020. FINDINGS: The study is motion degraded, moderately so on the axial sequences. Segmentation: Standard. Alignment: Trace retrolisthesis of L4 on L5. Straightening of the normal lumbar lordosis. Vertebrae: No fracture, suspicious marrow lesion, or significant marrow edema. Mild chronic degenerative endplate changes at L5-S1. Conus medullaris and cauda equina: Conus extends to the L1-2 level. Conus and cauda equina appear normal. Paraspinal and other soft tissues: Unremarkable. Disc levels: Diffuse congenital narrowing of the lumbar spinal canal due to short pedicles. T12-L1 and L1-2: Negative. L2-3: Minimal disc bulging without stenosis. L3-4: Mild disc bulging without significant stenosis. L4-5: Mild disc desiccation. Circumferential disc bulging and mild facet hypertrophy result in mild spinal stenosis, mild bilateral lateral recess stenosis, and mild-to-moderate bilateral neural foraminal stenosis. L5-S1: Disc desiccation. Disc bulging and mild facet hypertrophy result in mild right and moderate left neural foraminal stenosis without spinal stenosis. IMPRESSION: 1. Motion degraded examination. 2. 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 Electronically Signed   By: Sebastian Ache M.D.   On: 05/16/2021 18:00  ? ?CT CORONARY MORPH W/CTA COR  W/SCORE W/CA W/CM &/OR WO/CM ? ?Addendum Date: 06/01/2021   ?ADDENDUM REPORT: 06/01/2021 14:34 CLINICAL DATA:  46 Year-old African American Male EXAM: Cardiac/Coronary  CTA TECHNIQUE: The patient was scanned on a [employer]. FINDINGS: Scan was triggered in the descending thoracic aorta. Axial non-contrast 3 mm slices were carried out through the heart. The data set was analyzed on a dedicated work station and scored using the Agatson method. Gantry rotation speed was 250 msecs and collimation was .6 mm. 0.8 mg of sl NTG was given. The 3D data set was reconstructed in 5% intervals of the 67-82 % of the R-R cycle. Diastolic phases were analyzed on a dedicated work station using MPR, MIP and VRT modes. The patient received 95 cc of contrast. Aorta:  Normal size.  No calcifications.  No dissection. Main Pulmonary Artery: Normal size of the pulmonary artery. Aortic Valve:  Tri-leaflet.  No calcifications. Coronary Arteries:  Normal coronary origin.  Left dominance. Coronary Calcium Score: Left main: 24 Left anterior descending artery: 0 Left circumflex artery: 0 Right coronary artery: 0 Ramus intermedius artery: 0 Total: 24 RCA is a non dominant artery.  There is no significant plaque. Left main is a large artery that gives rise to LAD and LCX arteries. There is a minimal distal calcified plaque. LAD is a large vessel that gives rise to one large D1 Branch and wraps around the heart to supply the PDA region. There is a minimal soft plaque in the mid LAD. LCX is a non-dominant artery that gives rise to one OM1 branch. There is no significant plaque. There is a ramus intermedius vessel with no significant plaque. Other findings: Normal pulmonary vein drainage into the left atrium. Normal left atrial appendage without a thrombus. Extra-cardiac findings: See attached radiology report for non-cardiac structures. IMPRESSION: 1. Coronary  calcium score of 24. Elevated based on age, race, and gender. 2. Normal coronary  origin with left dominance. 3. CAD-RADS 1. Minimal non-obstructive CAD (1-24%). Consider non-atherosclerotic causes of chest pain. Consider preventive therapy and risk factor modification. RECOMMENDATIONS:

## 2021-06-01 NOTE — Progress Notes (Addendum)
PT Cancellation Note ? ?Patient Details ?Name: Shaquel McGregor-Shabazz ?MRN: [id number] ?DOB: [birth year] ? ? ?Cancelled Treatment:    Reason Eval/Treat Not Completed: Medical issues which prohibited therapy.  Pt had 6/10 chest pain and nursing requested a hold on therapy. Awaiting further testing to dx issues.  Follow up tomorrow to see if pt is medically ready. ? ? ?Ivar Drape ?06/01/2021, 12:38 PM ? ?Samul Dada, PT PhD ?Acute Rehab Dept. Number: [facility] [phone] and [facility] [phone] ? ?

## 2021-06-01 NOTE — TOC Progression Note (Signed)
Transition of Care (TOC) - Progression Note  ? ? ?Patient Details  ?Name: Jordan Sparks ?MRN: [id number] ?Date of Birth: 1975-12-26 ? ?Transition of Care (TOC) CM/SW Contact  ?Zenon Mayo, RN ?Phone Number: ?06/01/2021, 4:57 PM ? ?Clinical Narrative:    ?from home with wife, indep, chest pain, elevated trops, for ct cardio today, for renal u/s. Uses cpap at home at night.  TOC will continue to follow for dc needs.  ? ? ?  ?  ? ?Expected Discharge Plan and Services ?  ?  ?  ?  ?  ?                ?  ?  ?  ?  ?  ?  ?  ?  ?  ?  ? ? ?Social Determinants of Health (SDOH) Interventions ?  ? ?Readmission Risk Interventions ?   ? View : No data to display.  ?  ?  ?  ? ? ?

## 2021-06-01 NOTE — Progress Notes (Signed)
? ?Progress Note ? ?Patient Name: Jordan Sparks ?Date of Encounter: 06/01/2021 ? ?Primary Cardiologist: None  ? ?Subjective  ? ?Chest pain ? ?Inpatient Medications  ?  ?Scheduled Meds: ? amLODipine  5 mg Oral Daily  ? aspirin EC  81 mg Oral Daily  ? buPROPion  150 mg Oral BID  ? carvedilol  12.5 mg Oral BID WC  ? cloNIDine  0.2 mg Oral BID  ? docusate sodium  100 mg Oral BID  ? enoxaparin (LOVENOX) injection  40 mg Subcutaneous Q24H  ? FLUoxetine  20 mg Oral Daily  ? fluticasone  2 spray Each Nare Daily  ? furosemide  40 mg Intravenous BID  ? gabapentin  300 mg Oral BID  ? insulin aspart  0-15 Units Subcutaneous TID WC  ? insulin aspart  0-5 Units Subcutaneous QHS  ? lithium carbonate  300 mg Oral BID  ? loratadine  10 mg Oral Daily  ? nitroGLYCERIN      ? sodium chloride flush  3 mL Intravenous Q12H  ? ?Continuous Infusions: ? nitroGLYCERIN Stopped (05/31/21 2325)  ? ?PRN Meds: ?acetaminophen **OR** acetaminophen, bisacodyl, busPIRone, hydrALAZINE, ondansetron **OR** ondansetron (ZOFRAN) IV, oxyCODONE, polyethylene glycol, traZODone  ? ?Vital Signs  ?  ?Vitals:  ? 06/01/21 0900 06/01/21 1000 06/01/21 1100 06/01/21 1105  ?BP:    (!) 137/117  ?Pulse:    77  ?Resp: 18 17 16  (!) 22  ?Temp:      ?TempSrc:      ?SpO2:    98%  ?Weight:      ?Height:      ? ? ?Intake/Output Summary (Last 24 hours) at 06/01/2021 1630 ?Last data filed at 05/31/2021 2340 ?Gross per 24 hour  ?Intake 489.07 ml  ?Output 500 ml  ?Net -10.93 ml  ? ?Filed Weights  ? 05/31/21 1700 06/01/21 0458  ?Weight: (!) 139.4 kg (!) 137.5 kg  ? ? ?Telemetry  ?  ?SR - Personally Reviewed ? ?ECG  ?  ?No new - Personally Reviewed ? ?Physical Exam  ? ?GEN: No acute distress.   ?Neck: No JVD ?Cardiac: regular rhythm, normal rate, no murmurs, rubs, or gallops.  ?Respiratory: Clear to auscultation bilaterally. ?GI: Soft, nontender, non-distended  ?MS: No edema; No deformity. ?Neuro:  Nonfocal  ?Psych: Normal affect  ? ?Labs  ?  ?Chemistry ?Recent Labs  ?Lab  05/31/21 ?0352 06/01/21 ?06/03/21  ?NA 134* 138  ?K 3.5 3.2*  ?CL 100 100  ?CO2 26 31  ?GLUCOSE 247* 212*  ?BUN 12 11  ?CREATININE 1.01 1.00  ?CALCIUM 9.0 9.1  ?GFRNONAA >60 >60  ?ANIONGAP 8 7  ?  ? ?Hematology ?Recent Labs  ?Lab 05/31/21 ?0352 06/01/21 ?06/03/21  ?WBC 10.9* 8.4  ?RBC 5.31 5.10  ?HGB 14.1 13.5  ?HCT 42.6 40.4  ?MCV 80.2 79.2*  ?MCH 26.6 26.5  ?MCHC 33.1 33.4  ?RDW 13.8 13.5  ?PLT 265 237  ? ? ?Cardiac EnzymesNo results for input(s): TROPONINI in the last 168 hours. No results for input(s): TROPIPOC in the last 168 hours.  ? ?BNP ?Recent Labs  ?Lab 05/31/21 ?0352  ?BNP 90.3  ?  ? ?DDimer  ?Recent Labs  ?Lab 05/31/21 ?1235  ?DDIMER 0.77*  ?  ? ?Radiology  ?  ?DG Chest 2 View ? ?Result Date: 05/31/2021 ?CLINICAL DATA:  Chest pain and shortness of breath. EXAM: CHEST - 2 VIEW COMPARISON:  Portable chest 10/19/2020. FINDINGS: The heart is moderately enlarged. There is increased perihilar vascular congestion development of lower zonal interstitial  edema and trace pleural effusions. Perihilar hazy opacities also seen in probably due to ground-glass edema, less likely pneumonitis. The remaining lungs are clear. There is a stable mediastinal configuration. Thoracic cage is intact. IMPRESSION: Cardiomegaly with increased vascular congestion and interstitial edema and development of trace pleural effusions. Perihilar haziness is probably due to ground-glass edema, less likely pneumonitis. Electronically Signed   By: Almira Bar M.D.   On: 05/31/2021 04:10  ? ?CT Angio Chest PE W and/or Wo Contrast ? ?Result Date: 05/31/2021 ?CLINICAL DATA:  Chest pain. EXAM: CT ANGIOGRAPHY CHEST WITH CONTRAST TECHNIQUE: Multidetector CT imaging of the chest was performed using the standard protocol during bolus administration of intravenous contrast. Multiplanar CT image reconstructions and MIPs were obtained to evaluate the vascular anatomy. RADIATION DOSE REDUCTION: This exam was performed according to the departmental  dose-optimization program which includes automated exposure control, adjustment of the mA and/or kV according to patient size and/or use of iterative reconstruction technique. CONTRAST:  53mL OMNIPAQUE IOHEXOL 350 MG/ML SOLN COMPARISON:  June 18, 2020. FINDINGS: Cardiovascular: There is limited opacification of the pulmonary arteries. There is no evidence of large central pulmonary embolus in the main pulmonary artery or the main portions of the left and right pulmonary arteries. However, smaller and more peripheral pulmonary emboli cannot be excluded on the basis of this exam. Mild cardiomegaly is noted. No pericardial effusion. Mediastinum/Nodes: No enlarged mediastinal, hilar, or axillary lymph nodes. Thyroid gland, trachea, and esophagus demonstrate no significant findings. Lungs/Pleura: Lungs are clear. No pleural effusion or pneumothorax. Upper Abdomen: No acute abnormality. Musculoskeletal: No chest wall abnormality. No acute or significant osseous findings. Review of the MIP images confirms the above findings. IMPRESSION: There is no evidence of large central pulmonary embolus in the main pulmonary artery or the main portions of the left and right pulmonary arteries. However, due to limited opacification of the pulmonary arteries, smaller and more peripheral pulmonary emboli cannot be excluded on the basis of this exam. Electronically Signed   By: Lupita Raider M.D.   On: 05/31/2021 14:41  ? ?CT CORONARY MORPH W/CTA COR W/SCORE W/CA W/CM &/OR WO/CM ? ?Addendum Date: 06/01/2021   ?ADDENDUM REPORT: 06/01/2021 14:34 CLINICAL DATA:  46 Year-old African American Male EXAM: Cardiac/Coronary  CTA TECHNIQUE: The patient was scanned on a [employer]. FINDINGS: Scan was triggered in the descending thoracic aorta. Axial non-contrast 3 mm slices were carried out through the heart. The data set was analyzed on a dedicated work station and scored using the Agatson method. Gantry rotation speed was 250 msecs  and collimation was .6 mm. 0.8 mg of sl NTG was given. The 3D data set was reconstructed in 5% intervals of the 67-82 % of the R-R cycle. Diastolic phases were analyzed on a dedicated work station using MPR, MIP and VRT modes. The patient received 95 cc of contrast. Aorta:  Normal size.  No calcifications.  No dissection. Main Pulmonary Artery: Normal size of the pulmonary artery. Aortic Valve:  Tri-leaflet.  No calcifications. Coronary Arteries:  Normal coronary origin.  Left dominance. Coronary Calcium Score: Left main: 24 Left anterior descending artery: 0 Left circumflex artery: 0 Right coronary artery: 0 Ramus intermedius artery: 0 Total: 24 RCA is a non dominant artery.  There is no significant plaque. Left main is a large artery that gives rise to LAD and LCX arteries. There is a minimal distal calcified plaque. LAD is a large vessel that gives rise to one large D1 Branch and wraps  around the heart to supply the PDA region. There is a minimal soft plaque in the mid LAD. LCX is a non-dominant artery that gives rise to one OM1 branch. There is no significant plaque. There is a ramus intermedius vessel with no significant plaque. Other findings: Normal pulmonary vein drainage into the left atrium. Normal left atrial appendage without a thrombus. Extra-cardiac findings: See attached radiology report for non-cardiac structures. IMPRESSION: 1. Coronary calcium score of 24. Elevated based on age, race, and gender. 2. Normal coronary origin with left dominance. 3. CAD-RADS 1. Minimal non-obstructive CAD (1-24%). Consider non-atherosclerotic causes of chest pain. Consider preventive therapy and risk factor modification. RECOMMENDATIONS: Coronary artery calcium (CAC) score is a strong predictor of incident coronary heart disease (CHD) and provides predictive information beyond traditional risk factors. CAC scoring is reasonable to use in the decision to withhold, postpone, or initiate statin therapy in  intermediate-risk or selected borderline-risk asymptomatic adults (age 20-75 years and LDL-C >=70 to <190 mg/dL) who do not have diabetes or established atherosclerotic cardiovascular disease (ASCVD).* In intermediate-risk (10-year A

## 2021-06-01 NOTE — Discharge Instructions (Signed)
Low Sodium Nutrition Therapy  ?Eating less sodium can help you if you have high blood pressure, heart failure, or kidney or liver disease.  ? ?Your body needs a little sodium, but too much sodium can cause your body to hold onto extra water. This extra water will raise your blood pressure and can cause damage to your heart, kidneys, or liver as they are forced to work harder.  ? ?Sometimes you can see how the extra fluid affects you because your hands, legs, or belly swell. You may also hold water around your heart and lungs, which makes it hard to breathe.  ? ?Even if you take medication for blood pressure or a water pill (diuretic) to remove fluid, it is still important to have less salt in your diet.  ? ?Check with your primary care provider before drinking alcohol since it may affect the amount of fluid in your body and how your heart, kidneys, or liver work. ?Sodium in Food ?A low-sodium meal plan limits the sodium that you get from food and beverages to 1,500-2,000 milligrams (mg) per day. Salt is the main source of sodium. Read the nutrition label on the package to find out how much sodium is in one serving of a food.  ?Select foods with 140 milligrams (mg) of sodium or less per serving.  ?You may be able to eat one or two servings of foods with a little more than 140 milligrams (mg) of sodium if you are closely watching how much sodium you eat in a day.  ?Check the serving size on the label. The amount of sodium listed on the label shows the amount in one serving of the food. So, if you eat more than one serving, you will get more sodium than the amount listed. ? ?Tips ?Cutting Back on Sodium ?Eat more fresh foods.  ?Fresh fruits and vegetables are low in sodium, as well as frozen vegetables and fruits that have no added juices or sauces.  ?Fresh meats are lower in sodium than processed meats, such as bacon, sausage, and hotdogs.  ?Not all processed foods are unhealthy, but some processed foods may have too  much sodium.  ?Eat less salt at the table and when cooking. One of the ingredients in salt is sodium.  ?One teaspoon of table salt has 2,300 milligrams of sodium.  ?Leave the salt out of recipes for pasta, casseroles, and soups. ?Be a smart shopper.  ?Food packages that say ?Salt-free?, sodium-free?, ?very low sodium,? and ?low sodium? have less than 140 milligrams of sodium per serving.  ?Beware of products identified as ?Unsalted,? ?No Salt Added,? ?Reduced Sodium,? or ?Lower Sodium.? These items may still be high in sodium. You should always check the nutrition label. ?Add flavors to your food without adding sodium.  ?Try lemon juice, lime juice, or vinegar.  ?Dry or fresh herbs add flavor.  ?Buy a sodium-free seasoning blend or make your own at home. ?You can purchase salt-free or sodium-free condiments like barbeque sauce in stores and online. Ask your registered dietitian nutritionist for recommendations and where to find them.  ? ?Eating in Restaurants ?Choose foods carefully when you eat outside your home. Restaurant foods can be very high in sodium. Many restaurants provide nutrition facts on their menus or their websites. If you cannot find that information, ask your server. Let your server know that you want your food to be cooked without salt and that you would like your salad dressing and sauces to be served on the   side.  ? ? ?Foods Recommended ?Food Group Foods Recommended  ?Grains Bread, bagels, rolls without salted tops ?Homemade bread made with reduced-sodium baking powder ?Cold cereals, especially shredded wheat and puffed rice ?Oats, grits, or cream of wheat ?Pastas, quinoa, and rice ?Popcorn, pretzels or crackers without salt ?Corn tortillas  ?Protein Foods Fresh meats and fish; turkey bacon (check the nutrition labels - make sure they are not packaged in a sodium solution) ?Canned or packed tuna (no more than 4 ounces at 1 serving) ?Beans and peas ?Soybeans) and tofu ?Eggs ?Nuts or nut butters  without salt  ?Dairy Milk or milk powder ?Plant milks, such as rice and soy ?Yogurt, including Greek yogurt ?Small amounts of natural cheese (blocks of cheese) or reduced-sodium cheese can be used in moderation. (Swiss, ricotta, and fresh mozzarella cheese are lower in sodium than the others) ?Cream Cheese ?Low sodium cottage cheese  ?Vegetables Fresh and frozen vegetables without added sauces or salt ?Homemade soups (without salt) ?Low-sodium, salt-free or sodium-free canned vegetables and soups  ?Fruit Fresh and canned fruits ?Dried fruits, such as raisins, cranberries, and prunes  ?Oils Tub or liquid margarine, regular or without salt ?Canola, corn, peanut, olive, safflower, or sunflower oils  ?Condiments Fresh or dried herbs such as basil, bay leaf, dill, mustard (dry), nutmeg, paprika, parsley, rosemary, sage, or thyme.  ?Low sodium ketchup ?Vinegar  ?Lemon or lime juice ?Pepper, red pepper flakes, and cayenne. ?Hot sauce contains sodium, but if you use just a drop or two, it will not add up to much.  ?Salt-free or sodium-free seasoning mixes and marinades ?Simple salad dressings: vinegar and oil  ? ?Foods Not Recommended ?Food Group Foods Not Recommended  ?Grains Breads or crackers topped with salt ?Cereals (hot/cold) with more than 300 mg sodium per serving ?Biscuits, cornbread, and other ?quick? breads prepared with baking soda ?Pre-packaged bread crumbs ?Seasoned and packaged rice and pasta mixes ?Self-rising flours  ?Protein Foods Cured meats: Bacon, ham, sausage, pepperoni and hot dogs ?Canned meats (chili, vienna sausage, or sardines) ?Smoked fish and meats ?Frozen meals that have more than 600 mg of sodium per serving ?Egg substitute (with added sodium)  ?Dairy Buttermilk ?Processed cheese spreads ?Cottage cheese (1 cup may have over 500 mg of sodium; look for low-sodium.) ?American or feta cheese ?Shredded Cheese has more sodium than blocks of cheese ?String cheese  ?Vegetables Canned vegetables  (unless they are salt-free, sodium-free or low sodium) ?Frozen vegetables with seasoning and sauces ?Sauerkraut and pickled vegetables ?Canned or dried soups (unless they are salt-free, sodium-free, or low sodium) ?French fries and onion rings  ?Fruit Dried fruits preserved with additives that have sodium  ?Oils Salted butter or margarine, all types of olives  ?Condiments Salt, sea salt, kosher salt, onion salt, and garlic salt ?Seasoning mixes with salt ?Bouillon cubes ?Ketchup ?Barbeque sauce and Worcestershire sauce unless low sodium ?Soy sauce ?Salsa, pickles, olives, relish ?Salad dressings: ranch, blue cheese, Italian, and French.  ? ?Low Sodium Sample 1-Day Menu  ?Breakfast 1 cup cooked oatmeal  ?1 slice whole wheat bread toast  ?1 tablespoon peanut butter without salt  ?1 banana  ?1 cup 1% milk  ?Lunch Tacos made with: 2 corn tortillas  ?? cup black beans, low sodium  ?? cup roasted or grilled chicken (without skin)  ?? avocado  ?Squeeze of lime juice  ?1 cup salad greens  ?1 tablespoon low-sodium salad dressing  ?? cup strawberries  ?1 orange  ?Afternoon Snack 1/3 cup grapes  ?6 ounces yogurt  ?  Evening Meal 3 ounces herb-baked fish  ?1 baked potato  ?2 teaspoons olive oil  ?? cup cooked carrots  ?2 thick slices tomatoes on:  ?2 lettuce leaves  ?1 teaspoon olive oil  ?1 teaspoon balsamic vinegar  ?1 cup 1% milk  ?Evening Snack 1 apple  ?? cup almonds without salt  ? ?Low-Sodium Vegetarian (Lacto-Ovo) Sample 1-Day Menu  ?Breakfast 1 cup cooked oatmeal  ?1 slice whole wheat toast  ?1 tablespoon peanut butter without salt  ?1 banana  ?1 cup 1% milk  ?Lunch Tacos made with: 2 corn tortillas  ?? cup black beans, low sodium  ?? cup roasted or grilled chicken (without skin)  ?? avocado  ?Squeeze of lime juice  ?1 cup salad greens  ?1 tablespoon low-sodium salad dressing  ?? cup strawberries  ?1 orange  ?Evening Meal Stir fry made with: ? cup tofu  ?1 cup brown rice  ?? cup broccoli  ?? cup green beans  ?? cup  peppers  ?? tablespoon peanut oil  ?1 orange  ?1 cup 1% milk  ?Evening Snack 4 strips celery  ?2 tablespoons hummus  ?1 hard-boiled egg  ? ?Low-Sodium Vegan Sample 1-Day Menu  ?Breakfast 1 cup cooked oatmeal  ?1

## 2021-06-02 ENCOUNTER — Encounter (HOSPITAL_COMMUNITY): Payer: Self-pay | Admitting: Internal Medicine

## 2021-06-02 ENCOUNTER — Other Ambulatory Visit (HOSPITAL_COMMUNITY): Payer: Self-pay

## 2021-06-02 LAB — GLUCOSE, CAPILLARY
Glucose-Capillary: 175 mg/dL — ABNORMAL HIGH (ref 70–99)
Glucose-Capillary: 178 mg/dL — ABNORMAL HIGH (ref 70–99)
Glucose-Capillary: 176 mg/dL — ABNORMAL HIGH (ref 70–99)
Glucose-Capillary: 133 mg/dL — ABNORMAL HIGH (ref 70–99)
Glucose-Capillary: 164 mg/dL — ABNORMAL HIGH (ref 70–99)

## 2021-06-02 LAB — MAGNESIUM: Magnesium: 2.1 mg/dL (ref 1.7–2.4)

## 2021-06-02 MED ORDER — POTASSIUM CHLORIDE CRYS ER 20 MEQ PO TBCR
40.0000 meq | EXTENDED_RELEASE_TABLET | Freq: Two times a day (BID) | ORAL | Status: AC
  Administered 2021-06-02 (×2): 40 meq via ORAL
  Filled 2021-06-02 (×2): qty 2

## 2021-06-02 MED ORDER — CHLORHEXIDINE GLUCONATE 0.12 % MT SOLN
15.0000 mL | Freq: Two times a day (BID) | OROMUCOSAL | Status: DC
  Administered 2021-06-02 – 2021-06-03 (×2): 15 mL via OROMUCOSAL
  Filled 2021-06-02 (×3): qty 15

## 2021-06-02 MED ORDER — CARVEDILOL 25 MG PO TABS
25.0000 mg | ORAL_TABLET | Freq: Two times a day (BID) | ORAL | Status: DC
Start: 2021-06-02 — End: 2021-06-03
  Administered 2021-06-02 – 2021-06-03 (×2): 25 mg via ORAL
  Filled 2021-06-02 (×2): qty 1

## 2021-06-02 MED ORDER — ORAL CARE MOUTH RINSE
15.0000 mL | Freq: Two times a day (BID) | OROMUCOSAL | Status: DC
  Administered 2021-06-02 (×2): 15 mL via OROMUCOSAL

## 2021-06-02 MED ORDER — CLONIDINE HCL 0.1 MG PO TABS
0.1000 mg | ORAL_TABLET | Freq: Two times a day (BID) | ORAL | Status: DC
  Administered 2021-06-02 – 2021-06-03 (×2): 0.1 mg via ORAL
  Filled 2021-06-02 (×2): qty 1

## 2021-06-02 NOTE — Progress Notes (Addendum)
Heart Failure Stewardship Pharmacist Progress Note ? ? ?PCP: de [place], Buren Kos, MD ?PCP-Cardiologist: None  ? ? ?HPI:  ?46 yo M with PMH of HTN, T2DM, obesity, polysubstance abuse, OSA, and lumbar stenosis. He presented to the ED on 3/21 with shortness of breath and chest pain. CXR with cardiomegaly and increased vascular congestion. CTA negative for PE. An ECHO was done on 3/22 and LVEF is reduced to 25-30%. Coronary CTA done 3/22 and elevated coronary calcium score based on demographics, minimal nonobstructive CAD. ? ?Current HF Medications: ?Diuretic: furosemide 40 mg IV BID ?Beta Blocker: carvedilol 25 mg BID ?ACE/ARB/ARNI: Sherryll Burger 24/26 mg BID ?SGLT2i: Farxiga 10 mg daily ? ?Prior to admission HF Medications: ?Diuretic: furosemide 20 mg daily ?Beta blocker: carvedilol 6.25 mg BID ? ?Pertinent Lab Values: ?Serum creatinine 1.00, BUN 11, Potassium 3.2, Sodium 138, BNP 90.3, A1c 7.7  ? ?Vital Signs: ?Weight: 303 lbs (admission weight: 307 lbs) ?Blood pressure: 150/100s  ?Heart rate: 70s  ?I/O: - yesterday; net - ? ?Medication Assistance / Insurance Benefits Check: ?Does the patient have prescription insurance?  Yes ?Type of insurance plan: [employer] commercial insurance ? ?Outpatient Pharmacy:  ?Prior to admission outpatient pharmacy: [facility] ?Is the patient willing to use [facility] TOC pharmacy at discharge? Yes ? ? ?Assessment: ?1. 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 NYHA class III symptoms. ?- Continue furosemide 40 mg IV BID. K replacement orders in. Check mag. ?- Agree with increasing to carvedilol 25 mg BID ?- Continue Entresto 24/26 mg BID - consider increasing to 49/51 mg BID ?- Consider adding spironolactone prior to discharge ?- Agree with adding Farxiga 10 mg daily ?  ?Plan: ?1) Medication changes recommended at this time: ?- Stop amlodipine with new HFrEF - can titrate Entresto for additional BP lowering. ?- Check magnesium with AM labs ? ?2) Patient  assistance: ?- Entresto copay $15 ?- Farxiga copay $15 ?- Can utilize monthly copay cards for additional cost saving ? ?3)  Education  ?- To be completed prior to discharge ? ?Sharen Hones, PharmD, BCPS ?Heart Failure Stewardship Pharmacist ?Phone [phone] ? ? ?
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
## 2021-06-02 NOTE — Progress Notes (Signed)
OT Cancellation Note ? ?Patient Details ?Name: Jordan Sparks ?MRN: [id number] ?DOB: 1976-03-10 ? ? ?Cancelled Treatment:    Reason Eval/Treat Not Completed: OT screened, no needs identified, will sign off Per PT, pt Independent with ADLs/mobility with no formal OT eval needed at this time.  ? ?Lorre Munroe ?06/02/2021, 8:15 AM ?

## 2021-06-02 NOTE — Progress Notes (Signed)
Heart Failure Nurse Navigator Progress Note ? ?PCP: de [place], Raymond J, MD ?PCP-Cardiologist: Burt Knack ?Admission Diagnosis: Chest Pain ?Admitted from: Home ? ?Presentation:   ?Jordan Sparks presented with Chest pain, SOB, fatigue. While working out. Uses a CPAP at home, states he is compliant with meds and hasn't missed any doses. Patient does state that he smokes Marijuana.and drinks occasionally. He will also drink a few soda's per day, however watches how much salt he consumes,. He tries to work out at [employer] as much as possible. 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 Will have a HF TOC appt. 06/14/21 ? ?ECHO/ LVEF: 25-30% ? ?Clinical Course: ? ?Past Medical History:  ?Diagnosis Date  ? Anxiety   ? Depression   ? Diabetes mellitus type 2 in obese [facility])   ? Hypertension   ? Manic disorder ([place])   ? Obesity   ?  ? ?Social History  ? ?Socioeconomic History  ? Marital status: Married  ?  Spouse name: Jordan Sparks  ? Number of children: 2  ? Years of education: Not on file  ? Highest education level: Associate degree: academic program  ?Occupational History  ? Occupation: truck Geophysicist/field seismologist  ?  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 Smoking status: Former  ?  Packs/day: 0.25  ?  Years: 32.00  ?  Pack years: 8.00  ?  Types: Cigarettes  ?  Quit date: 04/2021  ?  Years since quitting: 0.1  ? Smokeless tobacco: Never  ?Vaping Use  ? Vaping Use: Never used  ?Substance and Sexual Activity  ? Alcohol use: Yes  ?  Comment: occasional  ? Drug use: Yes  ?  Types: Marijuana  ?  Comment: routinely  ? Sexual activity: Not on file  ?Other Topics Concern  ? Not on file  ?Social History Narrative  ? Not on file  ? ?Social Determinants of Health  ? ?Financial Resource Strain: Low Risk   ? Difficulty of Paying Living Expenses: Not hard at all  ?Food Insecurity: No Food Insecurity  ? Worried About Charity fundraiser in the Last Year: Never true  ? Ran Out of Food in  the Last Year: Never true  ?Transportation Needs: No Transportation Needs  ? Lack of Transportation (Medical): No  ? Lack of Transportation (Non-Medical): No  ?Physical Activity: Not on file  ?Stress: Not on file  ?Social Connections: Not on file  ? ?Education Assessment and Provision: ? ?Detailed education and instructions provided on heart failure disease management including the following: ? ?Signs and symptoms of Heart Failure ?When to call the physician ?Importance of daily weights ?Low sodium diet ?Fluid restriction ?Medication management ?Anticipated future follow-up appointments ? ?Patient education given on each of the above topics.  Patient acknowledges understanding via teach back method and acceptance of all instructions. ? ?Education Materials:  "Living Better With Heart Failure" Booklet, HF zone tool, & Daily Weight Tracker Tool. ? ?Patient has scale at home: yes ?Patient has pill box at home: NA   ? ?High Risk Criteria for Readmission and/or Poor Patient Outcomes: ?Heart failure hospital admissions (last 6 months):  1 ?No Show rate: 7% ?Difficult social situation: Currently not working, wife works ?Demonstrates medication adherence: yes ?Primary Language: English ?Literacy level: Read and write, comprehend ? ?Barriers of Care:   ?Daily weights ?Fluid intake ?Medication costs ? ?Considerations/Referrals:  ? ?Referral made to Heart Failure Pharmacist Stewardship:  yes, med costs ?Referral made to Heart Failure CSW/NCM TOC: yes,  ?Referral made to Heart & Vascular TOC clinic: yes, 06/14/21 ? ?Items for Follow-up on DC/TOC: ?Optimize ?Med compliance ?Fluid intake/ diet Ed. F/U ? ? ?Earnestine Leys, BSN, RN ?Heart Failure Nurse Navigator ?[phone]   ?
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
## 2021-06-02 NOTE — TOC Benefit Eligibility Note (Signed)
Patient Advocate Encounter ? ?Insurance verification completed.   ? ?The patient is currently admitted and upon discharge could be taking Entresto 24-26 mg. ? ?The current 30 day co-pay is, $15.00.  ? ?The patient is currently admitted and upon discharge could be taking Farxiga 10 mg. ? ?The current 30 day co-pay is, $15.00.  ? ?The patient is currently admitted and upon discharge could be taking Jardiance 10 mg. ? ?The current 30 day co-pay is, $15.00.  ? ?The patient is insured through [employer]  ? ? ? ?Roland Earl, CPhT ?Pharmacy Patient Advocate Specialist ?[facility] Pharmacy Patient Advocate Team ?Direct Number: [phone]  Fax: [phone] ? ? ? ? ? ?  ?

## 2021-06-03 ENCOUNTER — Other Ambulatory Visit (HOSPITAL_COMMUNITY): Payer: Self-pay

## 2021-06-03 LAB — BASIC METABOLIC PANEL
Anion gap: 10 (ref 5–15)
BUN: 18 mg/dL (ref 6–20)
CO2: 24 mmol/L (ref 22–32)
Calcium: 9.1 mg/dL (ref 8.9–10.3)
Chloride: 106 mmol/L (ref 98–111)
Creatinine, Ser: 1.12 mg/dL (ref 0.61–1.24)
GFR, Estimated: >60 mL/min (ref >60)
Glucose, Bld: 152 mg/dL — ABNORMAL HIGH (ref 70–99)
Potassium: 3.9 mmol/L (ref 3.5–5.1)
Sodium: 140 mmol/L (ref 135–145)

## 2021-06-03 LAB — GLUCOSE, CAPILLARY
Glucose-Capillary: 181 mg/dL — ABNORMAL HIGH (ref 70–99)
Glucose-Capillary: 226 mg/dL — ABNORMAL HIGH (ref 70–99)

## 2021-06-03 LAB — CBC
HCT: 46.1 % (ref 39.0–52.0)
Hemoglobin: 15.1 g/dL (ref 13.0–17.0)
MCH: 26.3 pg (ref 26.0–34.0)
MCHC: 32.8 g/dL (ref 30.0–36.0)
MCV: 80.2 fL (ref 80.0–100.0)
Platelets: 251 10*3/uL (ref 150–400)
RBC: 5.75 MIL/uL (ref 4.22–5.81)
RDW: 13.8 % (ref 11.5–15.5)
WBC: 10.9 10*3/uL — ABNORMAL HIGH (ref 4.0–10.5)
nRBC: 0 % (ref 0.0–0.2)

## 2021-06-03 MED ORDER — POTASSIUM CHLORIDE CRYS ER 20 MEQ PO TBCR
20.0000 meq | EXTENDED_RELEASE_TABLET | Freq: Two times a day (BID) | ORAL | Status: DC
  Administered 2021-06-03: 20 meq via ORAL
  Filled 2021-06-03: qty 1

## 2021-06-03 MED ORDER — CARVEDILOL 25 MG PO TABS
25.0000 mg | ORAL_TABLET | Freq: Two times a day (BID) | ORAL | 0 refills | Status: DC
  Filled 2021-06-03: qty 60, 30d supply, fill #0

## 2021-06-03 MED ORDER — SACUBITRIL-VALSARTAN 49-51 MG PO TABS: 1.0000 | ORAL_TABLET | Freq: Two times a day (BID) | ORAL | Status: DC

## 2021-06-03 MED ORDER — FUROSEMIDE 20 MG PO TABS
20.0000 mg | ORAL_TABLET | Freq: Two times a day (BID) | ORAL | 1 refills | Status: DC
  Filled 2021-06-03: qty 60, 30d supply, fill #0
  Filled 2021-06-05 – 2021-06-16 (×3): qty 60, 30d supply, fill #1
  Filled ????-??-??: fill #1

## 2021-06-03 MED ORDER — ASPIRIN 81 MG PO CHEW
81.0000 mg | CHEWABLE_TABLET | Freq: Every day | ORAL | 11 refills | Status: DC
  Filled 2021-06-03: qty 30, 30d supply, fill #0
  Filled 2021-06-28: qty 90, 90d supply, fill #0
  Filled 2021-09-27: qty 90, 90d supply, fill #1
  Filled 2021-12-26: qty 90, 90d supply, fill #2
  Filled 2022-03-25: qty 90, 90d supply, fill #3

## 2021-06-03 MED ORDER — ROSUVASTATIN CALCIUM 5 MG PO TABS
5.0000 mg | ORAL_TABLET | Freq: Every day | ORAL | 0 refills | Status: DC
  Filled 2021-06-03: qty 30, 30d supply, fill #0

## 2021-06-03 MED ORDER — SACUBITRIL-VALSARTAN 49-51 MG PO TABS
1.0000 | ORAL_TABLET | Freq: Two times a day (BID) | ORAL | 0 refills | Status: DC
  Filled 2021-06-03: qty 60, 30d supply, fill #0

## 2021-06-03 MED ORDER — CLONIDINE HCL 0.1 MG PO TABS
0.1000 mg | ORAL_TABLET | Freq: Two times a day (BID) | ORAL | 11 refills | Status: DC
  Filled 2021-06-03: qty 60, 30d supply, fill #0

## 2021-06-03 MED ORDER — POTASSIUM CHLORIDE CRYS ER 20 MEQ PO TBCR
20.0000 meq | EXTENDED_RELEASE_TABLET | Freq: Every day | ORAL | 0 refills | Status: DC
  Filled 2021-06-03: qty 30, 30d supply, fill #0

## 2021-06-03 MED ORDER — DAPAGLIFLOZIN PROPANEDIOL 10 MG PO TABS
10.0000 mg | ORAL_TABLET | Freq: Every day | ORAL | 0 refills | Status: DC
  Filled 2021-06-03: qty 30, 30d supply, fill #0

## 2021-06-03 MED ORDER — ASPIRIN 81 MG PO CHEW
81.0000 mg | CHEWABLE_TABLET | Freq: Every day | ORAL | 0 refills | Status: DC
  Filled 2021-06-03: qty 4, 4d supply, fill #0

## 2021-06-03 NOTE — Progress Notes (Signed)
Pivs dcd. Site unremarkable. Ccmd notified of patients dc order.  ?

## 2021-06-03 NOTE — Progress Notes (Signed)
Pt transported off unit via wheelchair with belongings and spouse to the side. Pt medications delivered to pt at bedside prior to discharge from the  unit. Dionne Bucy RN ?

## 2021-06-03 NOTE — Discharge Summary (Signed)
Physician Discharge Summary  ?Jordan Sparks [id number] DOB: 02-06-1976 DOA: 05/31/2021 ? ?PCP: [place], Raymond J, MD ? ?Admit date: 05/31/2021 ?Discharge date: 06/03/2021 ? ?Admitted From: Home ?Disposition: Home ? ?Recommendations for Outpatient Follow-up:  ?Follow up with PCP in 1-2 weeks ?Please obtain BMP/CBC in one week ?Please follow up with cardiology and heart failure team as scheduled 06/14/2021 ? ?Home Health: None ?Equipment/Devices: None ? ?Discharge Condition: Stable ?CODE STATUS: Full ?Diet recommendation: Low-salt low-fat low-carb diet ? ?Brief/Interim Summary: ?Jordan Sparks is a 46 y.o. male with medical history significant of mood d/o and HTN presenting with CP/SOB.  He reports SOB, fatigue as well. He has diffuse subacute pains (L shoulder, R arm and leg, neck...) and has an appointment with pain management this week.   ? ?Patient's cardiac evaluation ruled out ACS given low troponin, reassuring EKG.  Cardiology following given abnormally low EF on echocardiogram with new diagnosis of heart failure.  Lengthy discussion daily with patient, dietary, heart failure team reiterating need for salt and fluid restriction as well as blood pressure control which is likely the main etiology of his heart failure diagnosis.  At this time patient's blood pressure is markedly well controlled on current regimen including Entresto, amlodipine, carvedilol and furosemide.  Discontinuing clonidine per cardiology recommendations as well as decreasing aspirin to 81 mg(unclear why patient was on 324 outpatient).  Patient otherwise has been asymptomatic from a chest pain standpoint, he does complain of left shoulder pain worse with range of motion appears to be musculoskeletal as does his previous complaints of chest pain which appear to be lateral and originating from his posterior costophrenic angle.  Patient has follow-up with orthopedic in the next week which we recommended to continue.  Follow-up  with heart failure team on 06/14/2021 as scheduled. ? ?Discharge Diagnoses:  ?Principal Problem: ?  Chest pain of uncertain etiology ?Active Problems: ?  Mood disorder ([place]) ?  Hypertension ?  Diabetes mellitus ([place]) ?  Sleep apnea ?  Obesity, Class III, BMI 40-49.9 (morbid obesity) ([place]) ?  Chest pain ? ? ? ?Discharge Instructions ? ?Discharge Instructions   ? ? Discharge patient   Complete by: As directed ?  ? Discharge disposition: 01-Home or Self Care  ? Discharge patient date: 06/03/2021  ? ?  ? ?Allergies as of 06/03/2021   ?No Known Allergies ?  ? ?  ?Medication List  ?  ? ?STOP taking these medications   ? ?celecoxib 200 MG capsule ?Commonly known as: CeleBREX ?  ?cloNIDine 0.1 MG tablet ?Commonly known as: CATAPRES ?  ?ibuprofen 200 MG tablet ?Commonly known as: ADVIL ?  ?meclizine 25 MG tablet ?Commonly known as: ANTIVERT ?  ?meloxicam 7.5 MG tablet ?Commonly known as: MOBIC ?  ? ?  ? ?TAKE these medications   ? ?amLODipine 5 MG tablet ?Commonly known as: NORVASC ?Take 1 tablet (5 mg total) by mouth daily. ?  ?Aspirin Low Dose 81 MG chewable tablet ?Generic drug: aspirin ?Chew 1 tablet (81 mg total) by mouth daily. ?  ?blood glucose meter kit and supplies Kit ?Dispense based on patient and insurance preference. Use up to four times daily as directed. (FOR ICD-9 250.00, 250.01). ?  ?buPROPion 150 MG 12 hr tablet ?Commonly known as: WELLBUTRIN SR ?Take 1 tablet (150 mg total) by mouth 2 (two) times daily. ?  ?busPIRone 15 MG tablet ?Commonly known as: BUSPAR ?Take 15 mg by mouth 3 (three) times daily as needed (anxiety). ?  ?carvedilol 25 MG tablet ?Commonly  known as: COREG ?Take 1 tablet (25 mg total) by mouth 2 (two) times daily with a meal. ?What changed:  ?medication strength ?how much to take ?when to take this ?  ?cetirizine 10 MG tablet ?Commonly known as: ZYRTEC ?Take 10 mg by mouth daily. ?  ?Entresto 49-51 MG ?Generic drug: sacubitril-valsartan ?Take 1 tablet by mouth 2 (two) times daily. ?  ?Farxiga  10 MG Tabs tablet ?Generic drug: dapagliflozin propanediol ?Take 1 tablet (10 mg total) by mouth daily. ?Start taking on: June 04, 2021 ?  ?FLUoxetine 20 MG capsule ?Commonly known as: PROZAC ?Take 20 mg by mouth daily. ?  ?fluticasone 50 MCG/ACT nasal spray ?Commonly known as: FLONASE ?Place 2 sprays into both nostrils daily. ?  ?furosemide 20 MG tablet ?Commonly known as: LASIX ?Take 1 tablet (20 mg total) by mouth 2 (two) times daily. ?What changed: when to take this ?  ?gabapentin 300 MG capsule ?Commonly known as: NEURONTIN ?Take 1 capsule (300 mg total) by mouth 2 (two) times daily. ?What changed: how much to take ?  ?glipiZIDE 5 MG tablet ?Commonly known as: GLUCOTROL ?Take 1 tablet (5 mg total) by mouth 2 (two) times daily. ?  ?guaiFENesin 600 MG 12 hr tablet ?Commonly known as: Mucinex ?Take 1 tablet (600 mg total) by mouth 2 (two) times daily. ?  ?lithium carbonate 300 MG capsule ?Take 1 capsule (300 mg total) by mouth 2 (two) times daily. ?  ?metFORMIN 1000 MG tablet ?Commonly known as: GLUCOPHAGE ?Take 1 tablet (1,000 mg total) by mouth 2 (two) times daily. ?  ?multivitamin with minerals Tabs tablet ?Take 1 tablet by mouth daily. ?  ?omeprazole 20 MG capsule ?Commonly known as: PRILOSEC ?Take 1 capsule (20 mg total) by mouth 2 (two) times daily before a meal for 10 days. ?  ?potassium chloride SA 20 MEQ tablet ?Commonly known as: KLOR-CON M ?Take 1 tablet (20 mEq total) by mouth daily. ?  ?rosuvastatin 5 MG tablet ?Commonly known as: CRESTOR ?Take 1 tablet (5 mg total) by mouth daily. ?Start taking on: June 04, 2021 ?  ?sucralfate 1 g tablet ?Commonly known as: Carafate ?Take 1 tablet (1 g total) by mouth 4 (four) times daily -  with meals and at bedtime for 7 days. ?  ?traZODone 100 MG tablet ?Commonly known as: DESYREL ?Take 50 mg by mouth at bedtime as needed for sleep. ?  ?Vitamin D (Ergocalciferol) 1.25 MG (50000 UNIT) Caps capsule ?Commonly known as: DRISDOL ?Take 50,000 Units by mouth once a  week. ?  ?Vitamin D 50 MCG (2000 UT) Caps ?Take 2,000 Units by mouth daily. ?  ? ?  ? ? Follow-up Information   ? ? [place], Blondell Reveal, MD .   ?Specialty: Family Medicine ?Contact information: ?[address] ?[place] [address] ?[phone] ? ? ?  ?  ? ? [place] .   ?Why: GO: APRIL 25 AT 8AM ?Contact information: ?[address] ?[place] [address] ?[phone] ? ?  ?  ? ?  ?  ? ?  ? ?No Known Allergies ? ?Consultations: ?Heart failure team/cardiology ? ?Procedures/Studies: ?DG Chest 2 View ? ?Result Date: 05/31/2021 ?CLINICAL DATA:  Chest pain and shortness of breath. EXAM: CHEST - 2 VIEW COMPARISON:  Portable chest 10/19/2020. FINDINGS: The heart is moderately enlarged. There is increased perihilar vascular congestion development of lower zonal interstitial edema and trace pleural effusions. Perihilar hazy opacities also seen in probably due to ground-glass edema,  less likely pneumonitis. The remaining lungs are clear. There is a stable mediastinal configuration. Thoracic cage is intact. IMPRESSION: Cardiomegaly with increased vascular congestion and interstitial edema and development of trace pleural effusions. Perihilar haziness is probably due to ground-glass edema, less likely pneumonitis. Electronically Signed   By: Telford Nab M.D.   On: 05/31/2021 04:10  ? ?CT Angio Chest PE W and/or Wo Contrast ? ?Result Date: 05/31/2021 ?CLINICAL DATA:  Chest pain. EXAM: CT ANGIOGRAPHY CHEST WITH CONTRAST TECHNIQUE: Multidetector CT imaging of the chest was performed using the standard protocol during bolus administration of intravenous contrast. Multiplanar CT image reconstructions and MIPs were obtained to evaluate the vascular anatomy. RADIATION DOSE REDUCTION: This exam was performed according to the departmental dose-optimization program which includes automated exposure control, adjustment of the mA and/or kV according to  patient size and/or use of iterative reconstruction technique. CONTRAST:  63m OMNIPAQUE IOHEXOL 350 MG/ML SOLN COMPARISON:  June 18, 2020. FINDINGS: Cardiovascular: There is limited opacification of the pul

## 2021-06-03 NOTE — Progress Notes (Signed)
? ?Progress Note ? ?Patient Name: Jordan Sparks ?Date of Encounter: 06/03/2021 ? ?Primary Cardiologist: None  ? ?Subjective  ? ?No cp or sob ? ?Inpatient Medications  ?  ?Scheduled Meds: ? amLODipine  5 mg Oral Daily  ? aspirin EC  81 mg Oral Daily  ? buPROPion  150 mg Oral BID  ? carvedilol  25 mg Oral BID WC  ? chlorhexidine  15 mL Mouth Rinse BID  ? cloNIDine  0.1 mg Oral BID  ? dapagliflozin propanediol  10 mg Oral Daily  ? docusate sodium  100 mg Oral BID  ? enoxaparin (LOVENOX) injection  40 mg Subcutaneous Q24H  ? FLUoxetine  20 mg Oral Daily  ? fluticasone  2 spray Each Nare Daily  ? furosemide  40 mg Intravenous BID  ? gabapentin  300 mg Oral BID  ? insulin aspart  0-15 Units Subcutaneous TID WC  ? insulin aspart  0-5 Units Subcutaneous QHS  ? lithium carbonate  300 mg Oral BID  ? loratadine  10 mg Oral Daily  ? mouth rinse  15 mL Mouth Rinse q12n4p  ? rosuvastatin  5 mg Oral Daily  ? sacubitril-valsartan  1 tablet Oral BID  ? sodium chloride flush  3 mL Intravenous Q12H  ? ?Continuous Infusions: ? nitroGLYCERIN Stopped (05/31/21 2325)  ? ?PRN Meds: ?acetaminophen **OR** acetaminophen, bisacodyl, busPIRone, hydrALAZINE, ondansetron **OR** ondansetron (ZOFRAN) IV, oxyCODONE, polyethylene glycol, traZODone  ? ?Vital Signs  ?  ?Vitals:  ? 06/02/21 1923 06/02/21 2107 06/03/21 0200 06/03/21 0422  ?BP: (!) 134/93 (!) 154/99  (!) 150/100  ?Pulse: 90     ?Resp: 20 13  16   ?Temp: 97.9 ?F (36.6 ?C)   97.8 ?F (36.6 ?C)  ?TempSrc: Oral   Oral  ?SpO2: 98%   97%  ?Weight:   135.4 kg   ?Height:      ? ? ?Intake/Output Summary (Last 24 hours) at 06/03/2021 0547 ?Last data filed at 06/03/2021 0423 ?Gross per 24 hour  ?Intake 1620 ml  ?Output 5150 ml  ?Net -3530 ml  ? ?Filed Weights  ? 06/01/21 0458 06/02/21 0150 06/03/21 0200  ?Weight: (!) 137.5 kg (!) 137.6 kg 135.4 kg  ? ? ?Telemetry  ?  ?SR - Personally Reviewed ? ?ECG  ?  ?No new - Personally Reviewed ? ?Physical Exam  ? ?GEN: No acute distress.   ?Neck: No  JVD ?Cardiac: regular rhythm, normal rate, no murmurs, rubs, or gallops.  ?Respiratory: Clear to auscultation bilaterally. ?GI: Soft, nontender, non-distended  ?MS: No edema; No deformity. ?Neuro:  Nonfocal  ?Psych: Normal affect  ? ?Labs  ?  ?Chemistry ?Recent Labs  ?Lab 05/31/21 ?0352 06/01/21 ?06/03/21 06/03/21 ?0418  ?NA 134* 138 140  ?K 3.5 3.2* 3.9  ?CL 100 100 106  ?CO2 26 31 24   ?GLUCOSE 247* 212* 152*  ?BUN 12 11 18   ?CREATININE 1.01 1.00 1.12  ?CALCIUM 9.0 9.1 9.1  ?GFRNONAA >60 >60 >60  ?ANIONGAP 8 7 10   ?  ? ?Hematology ?Recent Labs  ?Lab 05/31/21 ?0352 06/01/21 ? 06/03/21 ?0418  ?WBC 10.9* 8.4 10.9*  ?RBC 5.31 5.10 5.75  ?HGB 14.1 13.5 15.1  ?HCT 42.6 40.4 46.1  ?MCV 80.2 79.2* 80.2  ?MCH 26.6 26.5 26.3  ?MCHC 33.1 33.4 32.8  ?RDW 13.8 13.5 13.8  ?PLT 265 237 251  ? ? ?Cardiac EnzymesNo results for input(s): TROPONINI in the last 168 hours. No results for input(s): TROPIPOC in the last 168 hours.  ? ?BNP ?Recent Labs  ?  Lab 05/31/21 ?0352  ?BNP 90.3  ?  ? ?DDimer  ?Recent Labs  ?Lab 05/31/21 ?1235  ?DDIMER 0.77*  ?  ? ?Radiology  ?  ?CT CORONARY MORPH W/CTA COR W/SCORE W/CA W/CM &/OR WO/CM ? ?Addendum Date: 06/01/2021   ?ADDENDUM REPORT: 06/01/2021 14:34 CLINICAL DATA:  46 Year-old African American Male EXAM: Cardiac/Coronary  CTA TECHNIQUE: The patient was scanned on a [employer]. FINDINGS: Scan was triggered in the descending thoracic aorta. Axial non-contrast 3 mm slices were carried out through the heart. The data set was analyzed on a dedicated work station and scored using the Agatson method. Gantry rotation speed was 250 msecs and collimation was .6 mm. 0.8 mg of sl NTG was given. The 3D data set was reconstructed in 5% intervals of the 67-82 % of the R-R cycle. Diastolic phases were analyzed on a dedicated work station using MPR, MIP and VRT modes. The patient received 95 cc of contrast. Aorta:  Normal size.  No calcifications.  No dissection. Main Pulmonary Artery: Normal size of the  pulmonary artery. Aortic Valve:  Tri-leaflet.  No calcifications. Coronary Arteries:  Normal coronary origin.  Left dominance. Coronary Calcium Score: Left main: 24 Left anterior descending artery: 0 Left circumflex artery: 0 Right coronary artery: 0 Ramus intermedius artery: 0 Total: 24 RCA is a non dominant artery.  There is no significant plaque. Left main is a large artery that gives rise to LAD and LCX arteries. There is a minimal distal calcified plaque. LAD is a large vessel that gives rise to one large D1 Branch and wraps around the heart to supply the PDA region. There is a minimal soft plaque in the mid LAD. LCX is a non-dominant artery that gives rise to one OM1 branch. There is no significant plaque. There is a ramus intermedius vessel with no significant plaque. Other findings: Normal pulmonary vein drainage into the left atrium. Normal left atrial appendage without a thrombus. Extra-cardiac findings: See attached radiology report for non-cardiac structures. IMPRESSION: 1. Coronary calcium score of 24. Elevated based on age, race, and gender. 2. Normal coronary origin with left dominance. 3. CAD-RADS 1. Minimal non-obstructive CAD (1-24%). Consider non-atherosclerotic causes of chest pain. Consider preventive therapy and risk factor modification. RECOMMENDATIONS: Coronary artery calcium (CAC) score is a strong predictor of incident coronary heart disease (CHD) and provides predictive information beyond traditional risk factors. CAC scoring is reasonable to use in the decision to withhold, postpone, or initiate statin therapy in intermediate-risk or selected borderline-risk asymptomatic adults (age 22-75 years and LDL-C >=70 to <190 mg/dL) who do not have diabetes or established atherosclerotic cardiovascular disease (ASCVD).* In intermediate-risk (10-year ASCVD risk >=7.5% to <20%) adults or selected borderline-risk (10-year ASCVD risk >=5% to <7.5%) adults in whom a CAC score is measured for the  purpose of making a treatment decision the following recommendations have been made: If CAC = 0, it is reasonable to withhold statin therapy and reassess in 5 to 10 years, as long as higher risk conditions are absent (diabetes mellitus, family history of premature CHD in first degree relatives (males <55 years; females <65 years), cigarette smoking, LDL >=190 mg/dL or other independent risk factors). If CAC is 1 to 99, it is reasonable to initiate statin therapy for patients >=69 years of age. If CAC is >=100 or >=75th percentile, it is reasonable to initiate statin therapy at any age. Cardiology referral should be considered for patients with CAC scores =400 or >=75th percentile. *2018 AHA/ACC/AACVPR/AAPA/ABC/ACPM/ADA/AGS/APhA/ASPC/NLA/PCNA Guideline on  the Management of Blood Cholesterol: A Report of the [employer] of Cardiology/American Heart Association Task Force on Clinical Practice Guidelines. J Am Coll Cardiol. 2019;73(24):3168-3209. Riley Lam, MD Electronically Signed   By: Riley Lam M.D.   On: 06/01/2021 14:34  ? ?Result Date: 06/01/2021 ?EXAM: OVER-READ INTERPRETATION  CT CHEST The following report is an over-read performed by radiologist Dr. Donzetta Kohut of [facility] on 06/01/2021. This over-read does not include interpretation of cardiac or coronary anatomy or pathology. The coronary calcium score/coronary CTA interpretation by the cardiologist is attached. Imaging confined to the mid chest and cardiac structures only. COMPARISON:  May 31, 2021. FINDINGS: Vascular: See dedicated report for cardiovascular details. Mediastinum/Nodes: No adenopathy in the central chest. Esophagus is mildly patulous and unchanged. Lungs/Pleura: Mild basilar atelectasis. No consolidation. No pleural effusion. Airways are patent. Upper Abdomen: At least mild hepatic steatosis. Limited imaging of upper abdominal contents without acute process otherwise unremarkable.  Musculoskeletal: No chest wall mass or suspicious bone lesions identified. IMPRESSION: 1. Hepatic steatosis which is at least mild. No acute extracardiac findings. Electronically Signed: By: Donzetta Kohut M.D. On: 06/01/2021

## 2021-06-03 NOTE — Progress Notes (Signed)
? ?Progress Note ? ?Patient Name: Jordan Sparks ?Date of Encounter: 06/03/2021 ? ?Primary Cardiologist: None  ? ?Subjective  ? ?No cp or sob ? ?Inpatient Medications  ?  ?Scheduled Meds: ? amLODipine  5 mg Oral Daily  ? aspirin EC  81 mg Oral Daily  ? buPROPion  150 mg Oral BID  ? carvedilol  25 mg Oral BID WC  ? chlorhexidine  15 mL Mouth Rinse BID  ? cloNIDine  0.1 mg Oral BID  ? dapagliflozin propanediol  10 mg Oral Daily  ? docusate sodium  100 mg Oral BID  ? enoxaparin (LOVENOX) injection  40 mg Subcutaneous Q24H  ? FLUoxetine  20 mg Oral Daily  ? fluticasone  2 spray Each Nare Daily  ? furosemide  40 mg Intravenous BID  ? gabapentin  300 mg Oral BID  ? insulin aspart  0-15 Units Subcutaneous TID WC  ? insulin aspart  0-5 Units Subcutaneous QHS  ? lithium carbonate  300 mg Oral BID  ? loratadine  10 mg Oral Daily  ? mouth rinse  15 mL Mouth Rinse q12n4p  ? potassium chloride  20 mEq Oral BID  ? rosuvastatin  5 mg Oral Daily  ? sacubitril-valsartan  1 tablet Oral BID  ? sodium chloride flush  3 mL Intravenous Q12H  ? ?Continuous Infusions: ? nitroGLYCERIN Stopped (05/31/21 2325)  ? ?PRN Meds: ?acetaminophen **OR** acetaminophen, bisacodyl, busPIRone, hydrALAZINE, ondansetron **OR** ondansetron (ZOFRAN) IV, oxyCODONE, polyethylene glycol, traZODone  ? ?Vital Signs  ?  ?Vitals:  ? 06/03/21 0827 06/03/21 0850 06/03/21 1000 06/03/21 1001  ?BP:  (!) 162/110 108/83 109/81  ?Pulse:   97 95  ?Resp:  20 20 13   ?Temp:      ?TempSrc: Oral     ?SpO2:  96%    ?Weight:      ?Height:      ? ? ?Intake/Output Summary (Last 24 hours) at 06/03/2021 1313 ?Last data filed at 06/03/2021 1150 ?Gross per 24 hour  ?Intake 1076 ml  ?Output 4625 ml  ?Net -3549 ml  ? ?Filed Weights  ? 06/01/21 0458 06/02/21 0150 06/03/21 0200  ?Weight: (!) 137.5 kg (!) 137.6 kg 135.4 kg  ? ? ?Telemetry  ?  ?SR - Personally Reviewed ? ?ECG  ?  ?No new - Personally Reviewed ? ?Physical Exam  ? ?GEN: No acute distress.   ?Neck: No JVD ?Cardiac: regular  rhythm, normal rate, no murmurs, rubs, or gallops.  ?Respiratory: Clear to auscultation bilaterally. ?GI: Soft, nontender, non-distended  ?MS: No edema; No deformity. ?Neuro:  Nonfocal  ?Psych: Normal affect  ? ?Labs  ?  ?Chemistry ?Recent Labs  ?Lab 05/31/21 ?0352 06/01/21 ?[id number] 06/03/21 ?0418  ?NA 134* 138 140  ?K 3.5 3.2* 3.9  ?CL 100 100 106  ?CO2 26 31 24   ?GLUCOSE 247* 212* 152*  ?BUN 12 11 18   ?CREATININE 1.01 1.00 1.12  ?CALCIUM 9.0 9.1 9.1  ?GFRNONAA >60 >60 >60  ?ANIONGAP 8 7 10   ?  ? ?Hematology ?Recent Labs  ?Lab 05/31/21 ?0352 06/01/21 ?[id number] 06/03/21 ?0418  ?WBC 10.9* 8.4 10.9*  ?RBC 5.31 5.10 5.75  ?HGB 14.1 13.5 15.1  ?HCT 42.6 40.4 46.1  ?MCV 80.2 79.2* 80.2  ?MCH 26.6 26.5 26.3  ?MCHC 33.1 33.4 32.8  ?RDW 13.8 13.5 13.8  ?PLT 265 237 251  ? ? ?Cardiac EnzymesNo results for input(s): TROPONINI in the last 168 hours. No results for input(s): TROPIPOC in the last 168 hours.  ? ?BNP ?Recent Labs  ?  Lab 05/31/21 ?[id number]  ?BNP 90.3  ?  ? ?DDimer  ?Recent Labs  ?Lab 05/31/21 ?1235  ?DDIMER 0.77*  ?  ? ?Radiology  ?  ?No results found. ? ?Cardiac Studies  ? ?Echo: ? 1. Left ventricular ejection fraction, by estimation, is 25 to 30%. The  ?left ventricle has severely decreased function. The left ventricle  ?demonstrates global hypokinesis. There is moderate concentric left  ?ventricular hypertrophy. Indeterminate  ?diastolic filling due to E-A fusion.  ? 2. Right ventricular systolic function is normal. The right ventricular  ?size is normal. Tricuspid regurgitation signal is inadequate for assessing  ?PA pressure.  ? 3. The mitral valve is grossly normal. Mild mitral valve regurgitation.  ?No evidence of mitral stenosis.  ? 4. The aortic valve is tricuspid. Aortic valve regurgitation is not  ?visualized. No aortic stenosis is present.  ? 5. The inferior vena cava is dilated in size with <50% respiratory  ?variability, suggesting right atrial pressure of 15 mmHg.  ? ?Patient Profile  ?   ?46 y.o. male  hypertension, diabetes mellitus, polysubstance abuse, sleep apnea on CPAP and lumbar stenosis seen for the evaluation of chest pain and shortness of breath, found now to have a nonischemic cardiomyopathy. ? ?Assessment & Plan  ? ?Principal Problem: ?  Chest pain of uncertain etiology ?Active Problems: ?  Mood disorder ([place]) ?  Hypertension ?  Diabetes mellitus ([place]) ?  Sleep apnea ?  Obesity, Class III, BMI 40-49.9 (morbid obesity) ([place]) ?  Chest pain ?  ?Chest pain - CTA with minimal CAD, cal score 24.  ?- continue ASA 81 mg daily, crestor started 5 mg daily ?- uses cocaine, will avoid selective BB. Increase carvedilol 25 mg BID ? ?NICM ?Hypertensive urgency-  ?- findings on echo most consistent with hypertensive heart disease. ?Heart Failure Therapy ?ACE-I/ARB/ARNI: entresto 49-51 mg BID ?BB: carvedilol 25 mg BID ?MRA: none yet, plan to start spironolactone at discharge. ?SGLT2I: Farxiga 10 mg daily. ?Diuretic plan: If patient is discharged today would continue at least furosemide 80 mg daily po . ?Cont amlodipine 5 mg daily ?Plan to stop clonidine prior to discharge. ? ?Will arrange TOC HF follow up for patient for med titration.  ? ?OSA - on cpap, continue ? ?   ? ?For questions or updates, please contact [place] ?Please consult [email or web address] for contact info under  ? ?  ?   ?Signed, ?Elouise Munroe, MD  ?06/03/21 ?1:19 PM ? ? ?

## 2021-06-03 NOTE — Progress Notes (Signed)
Heart Failure Stewardship Pharmacist Progress Note ? ? ?PCP: [place], Buren Kos, MD ?PCP-Cardiologist: None  ? ? ?HPI:  ?46 yo M with PMH of HTN, T2DM, obesity, polysubstance abuse, OSA, and lumbar stenosis. He presented to the ED on 3/21 with shortness of breath and chest pain. CXR with cardiomegaly and increased vascular congestion. CTA negative for PE. An ECHO was done on 3/22 and LVEF is reduced to 25-30%. Coronary CTA done 3/22 and elevated coronary calcium score based on demographics, minimal nonobstructive CAD. ? ?Current HF Medications: ?Diuretic: furosemide 40 mg IV BID ?Beta Blocker: carvedilol 25 mg BID ?ACE/ARB/ARNI: Sherryll Burger 49/51 mg BID ?SGLT2i: Farxiga 10 mg daily ? ?Prior to admission HF Medications: ?Diuretic: furosemide 20 mg daily ?Beta blocker: carvedilol 6.25 mg BID ? ?Pertinent Lab Values: ?Serum creatinine 1.12, BUN 18, Potassium 3.9, Sodium 140, BNP 90.3, A1c 7.7  ? ?Vital Signs: ?Weight: 298 lbs (admission weight: 307 lbs) ?Blood pressure: 150/100s  ?Heart rate: 70s  ?I/O: -4.4\L yesterday; net -3.9L ? ?Medication Assistance / Insurance Benefits Check: ?Does the patient have prescription insurance?  Yes ?Type of insurance plan: [employer] commercial insurance ? ?Outpatient Pharmacy:  ?Prior to admission outpatient pharmacy: [facility] ?Is the patient willing to use [facility] TOC pharmacy at discharge? Yes ? ? ?Assessment: ?1. Acute systolic CHF (EF 15-17%), due to NICM. NYHA class III symptoms. ?- Agree with discharging on furosemide 20 mg BID ?- Continue carvedilol 25 mg BID ?- Agree with increasing to Entresto 49/51 mg BID ?- Consider adding spironolactone prior to discharge ?- Continue Farxiga 10 mg daily ?  ?Plan: ?1) Medication changes recommended at this time: ?- Stop clonidine at discharge ?- Stop amlodipine with new HFrEF - can titrate Entresto further vs add spironolactone for additional BP lowering. ? ?2) Patient assistance: ?- Entresto copay $15 ?- Farxiga copay  $15 ?- Can utilize monthly copay cards for additional cost saving ? ?Sharen Hones, PharmD, BCPS ?Heart Failure Stewardship Pharmacist ?Phone [phone] ? ? ?

## 2021-06-03 NOTE — Progress Notes (Signed)
Pt discharge education and instructions completed with pt and spouse at bedside. Both voices understanding and denies any questions. Pt IV and telemetry moved. Pt discharged home with spouse to transport him home. Pt waiting on TOC to deliver home medication to bedside. Dionne Bucy RN ?

## 2021-06-03 NOTE — Plan of Care (Signed)
?  Problem: Elimination: Goal: Will not experience complications related to urinary retention Outcome: Completed/Met   Problem: Nutrition: Goal: Adequate nutrition will be maintained Outcome: Completed/Met   

## 2021-06-03 NOTE — TOC Transition Note (Addendum)
Transition of Care (TOC) - CM/SW Discharge Note ? ? ?Patient Details  ?Name: Jordan Sparks ?MRN: [id number] ?Date of Birth: September 20, 1975 ? ?Transition of Care (TOC) CM/SW Contact:  ?Leone Haven, RN ?Phone Number: ?06/03/2021, 10:58 AM ? ? ?Clinical Narrative:    ?Patient is for dc today, he states his wife will transport him home.  He is indep.  He states he usually does good with his diet, he keeps a food journal as well.  NCM informed him to just be aware of how fluid he is taking in.  NCM gave patient a farxiga 0 copay card and entresto coupons 0 copay and 10.00 copay.  ? ? ?  ?  ? ? ?Patient Goals and CMS Choice ?  ?  ?  ? ?Discharge Placement ?  ?           ?  ?  ?  ?  ? ?Discharge Plan and Services ?  ?  ?           ?  ?  ?  ?  ?  ?  ?  ?  ?  ?  ? ?Social Determinants of Health (SDOH) Interventions ?Food Insecurity Interventions: Intervention Not Indicated ?Financial Strain Interventions: Intervention Not Indicated ?Housing Interventions: Intervention Not Indicated ?Transportation Interventions: Intervention Not Indicated ? ? ?Readmission Risk Interventions ?   ? View : No data to display.  ?  ?  ?  ? ? ? ? ? ?

## 2021-06-05 ENCOUNTER — Other Ambulatory Visit (HOSPITAL_BASED_OUTPATIENT_CLINIC_OR_DEPARTMENT_OTHER): Payer: Self-pay | Admitting: Family Medicine

## 2021-06-05 MED ORDER — GABAPENTIN 300 MG PO CAPS
300.0000 mg | ORAL_CAPSULE | Freq: Two times a day (BID) | ORAL | 0 refills | Status: DC
  Filled 2021-06-05: qty 60, 30d supply, fill #0

## 2021-06-06 ENCOUNTER — Other Ambulatory Visit (HOSPITAL_COMMUNITY): Payer: Self-pay

## 2021-06-06 ENCOUNTER — Other Ambulatory Visit: Payer: Self-pay

## 2021-06-06 ENCOUNTER — Other Ambulatory Visit (HOSPITAL_BASED_OUTPATIENT_CLINIC_OR_DEPARTMENT_OTHER): Payer: Self-pay

## 2021-06-06 ENCOUNTER — Ambulatory Visit (INDEPENDENT_AMBULATORY_CARE_PROVIDER_SITE_OTHER): Payer: 59 | Admitting: Family Medicine

## 2021-06-06 ENCOUNTER — Other Ambulatory Visit (HOSPITAL_BASED_OUTPATIENT_CLINIC_OR_DEPARTMENT_OTHER): Payer: Self-pay | Admitting: Family Medicine

## 2021-06-06 VITALS — BP 128/88 | HR 96 | Temp 98.4°F | Ht 71.0 in | Wt 294.8 lb

## 2021-06-06 DIAGNOSIS — F419 Anxiety disorder, unspecified: Secondary | ICD-10-CM | POA: Diagnosis not present

## 2021-06-06 DIAGNOSIS — I428 Other cardiomyopathies: Secondary | ICD-10-CM

## 2021-06-06 DIAGNOSIS — E119 Type 2 diabetes mellitus without complications: Secondary | ICD-10-CM | POA: Diagnosis not present

## 2021-06-06 MED ORDER — CETIRIZINE HCL 10 MG PO TABS
10.0000 mg | ORAL_TABLET | Freq: Every day | ORAL | 0 refills | Status: DC
  Filled 2021-06-06: qty 100, 100d supply, fill #0

## 2021-06-06 MED ORDER — BLOOD GLUCOSE MONITOR KIT: PACK | 0 refills | Status: DC

## 2021-06-06 MED ORDER — FLUOXETINE HCL 20 MG PO CAPS
20.0000 mg | ORAL_CAPSULE | Freq: Every day | ORAL | 1 refills | Status: DC
  Filled 2021-06-06: qty 30, 30d supply, fill #0
  Filled 2021-06-28: qty 30, 30d supply, fill #1

## 2021-06-06 MED ORDER — GUAIFENESIN ER 600 MG PO TB12
600.0000 mg | ORAL_TABLET | Freq: Two times a day (BID) | ORAL | 0 refills | Status: DC
  Filled 2021-06-06: qty 20, 10d supply, fill #0

## 2021-06-06 NOTE — Assessment & Plan Note (Signed)
Patient recently was admitted to the hospital for evaluation of chest pain with concomitant hypertensive urgency.  During evaluation with cardiology during hospitalization, patient was found to have notably reduced ejection fraction at 25-30% with diagnosis of nonischemic cardiomyopathy.  It was felt that this was related to hypertensive heart disease. Patient was started on Entresto, carvedilol 12.5 mg twice daily, Farxiga.  Patient is scheduled to follow-up with cardiology as well as heart failure clinic in the coming weeks.  He still continues to have some exertional fatigue, decreased exercise tolerance.  He reports he was told to avoid exercising until following up with cardiology.  Indicates that he has been tolerating the medications well. ?On exam, heart with regular rate and rhythm.  Lungs clear to auscultation bilaterally. ?Discussed with patient need to continue with current medications, close follow-up with cardiology.  In regards to his decreased exercise capacity, advise close follow-up with cardiology, they may proceed with having patient complete cardiac rehab. ?

## 2021-06-06 NOTE — Progress Notes (Signed)
? ? ?  Procedures performed today:   ? ?None. ? ?Independent interpretation of notes and tests performed by another provider:  ? ?None. ? ?Brief History, Exam, Impression, and Recommendations:   ? ?BP 128/88   Pulse 96   Temp 98.4 ?F (36.9 ?C)   Ht 5\' 11"  (1.803 m)   Wt 294 lb 12.8 oz (133.7 kg)   SpO2 97%   BMI 41.12 kg/m?  ? ?Nonischemic cardiomyopathy ([place]) ?Patient recently was admitted to the hospital for evaluation of chest pain with concomitant hypertensive urgency.  During evaluation with cardiology during hospitalization, patient was found to have notably reduced ejection fraction at 25-30% with diagnosis of nonischemic cardiomyopathy.  It was felt that this was related to hypertensive heart disease. Patient was started on Entresto, carvedilol 12.5 mg twice daily, Farxiga.  Patient is scheduled to follow-up with cardiology as well as heart failure clinic in the coming weeks.  He still continues to have some exertional fatigue, decreased exercise tolerance.  He reports he was told to avoid exercising until following up with cardiology.  Indicates that he has been tolerating the medications well. ?On exam, heart with regular rate and rhythm.  Lungs clear to auscultation bilaterally. ?Discussed with patient need to continue with current medications, close follow-up with cardiology.  In regards to his decreased exercise capacity, advise close follow-up with cardiology, they may proceed with having patient complete cardiac rehab. ? ?Diabetes mellitus ([place]) ?Most recent hemoglobin A1c above goal at 7.7%.  He continues with metformin, glipizide.  He was started on Farxiga in relation to his heart failure, likely would also provide benefit related to improved control of diabetes.  Has had some symptoms as above related to underlying cardiac issues. ?Close follow-up regarding new medications, screening ?Complete nephropathy screening at next visit, neuropathy screening ? ?Anxiety ?Previously was referred to  psychiatry, however he has not heard from them.  On review of referrals, it does appear that referral was placed to [facility], however with [facility] listed in the comments and thus it seems [facility] close the referral without notification.  Patient still interested in establishing with psychiatry, will place new referral today ? ?Spent 35 minutes on this patient encounter, including preparation, chart review, face-to-face counseling with patient and coordination of care, and documentation of encounter ? ?Encourage patient to contact orthopedic surgeon for continued follow-up regarding various musculoskeletal issues.  Has had prior imaging for some of these issues in the past.  Has not had recent follow-up, on chart review, no appointment scheduled ? ?Plan for follow-up in about 4 to 6 weeks or sooner as needed ? ? ?___________________________________________ ?Harvy Riera de [place], MD, ABFM, CAQSM ?Primary Care and Sports Medicine ?[place] ?

## 2021-06-06 NOTE — Assessment & Plan Note (Signed)
Previously was referred to psychiatry, however he has not heard from them.  On review of referrals, it does appear that referral was placed to [facility], however with [facility] listed in the comments and thus it seems [facility] close the referral without notification.  Patient still interested in establishing with psychiatry, will place new referral today ?

## 2021-06-06 NOTE — Patient Instructions (Signed)

## 2021-06-06 NOTE — Assessment & Plan Note (Signed)
Most recent hemoglobin A1c above goal at 7.7%.  He continues with metformin, glipizide.  He was started on Farxiga in relation to his heart failure, likely would also provide benefit related to improved control of diabetes.  Has had some symptoms as above related to underlying cardiac issues. ?Close follow-up regarding new medications, screening ?Complete nephropathy screening at next visit, neuropathy screening ?

## 2021-06-07 ENCOUNTER — Other Ambulatory Visit (HOSPITAL_BASED_OUTPATIENT_CLINIC_OR_DEPARTMENT_OTHER): Payer: Self-pay | Admitting: Family Medicine

## 2021-06-09 ENCOUNTER — Encounter: Payer: Self-pay | Admitting: Orthopaedic Surgery

## 2021-06-09 ENCOUNTER — Ambulatory Visit (INDEPENDENT_AMBULATORY_CARE_PROVIDER_SITE_OTHER): Payer: 59 | Admitting: Orthopaedic Surgery

## 2021-06-09 ENCOUNTER — Other Ambulatory Visit (HOSPITAL_BASED_OUTPATIENT_CLINIC_OR_DEPARTMENT_OTHER): Payer: Self-pay

## 2021-06-09 DIAGNOSIS — M4802 Spinal stenosis, cervical region: Secondary | ICD-10-CM | POA: Diagnosis not present

## 2021-06-09 DIAGNOSIS — M5440 Lumbago with sciatica, unspecified side: Secondary | ICD-10-CM

## 2021-06-09 MED ORDER — HYDROCODONE-ACETAMINOPHEN 5-325 MG PO TABS
1.0000 | ORAL_TABLET | Freq: Four times a day (QID) | ORAL | 0 refills | Status: DC | PRN
  Filled 2021-06-09: qty 20, 5d supply, fill #0

## 2021-06-09 NOTE — Progress Notes (Signed)
? ?Office Visit Note ?  ?Patient: Jordan Sparks           ?Date of Birth: 27-Feb-1976           ?MRN: [id number] ?Visit Date: 06/09/2021 ?             ?Requested by: de [place], Raymond J, MD ?[phone] ?[place],  [place] [address] ?PCP: de [place], Raymond J, MD ? ? ?Assessment & Plan: ?Visit Diagnoses: No diagnosis found. ? ?Plan: 46 year old gentleman who had been seen by Dr. Cleophas Dunker.  An MRI was reviewed with him of his lower back.  Showed congenital stenosis with short pedicles.  Dr. Cleophas Dunker had referred him to Dr. Alvester Morin for epidural steroid injection.  The patient was hospitalized for some heart problems.  They think that they may have had voicemails however they were not at home at the time.  Patient continues to have severe pain radiating down his right leg.  Denies any loss of bowel or bladder control has some numbness.  We will facilitate help them getting back on Dr. Alvester Morin schedule soon as possible.  In the meantime I wrote him for few hydrocodone.  Because of his recent hospitalization he cannot take any ibuprofen.  I did caution him to use these only when he absolutely needs them ? ?Follow-Up Instructions: No follow-ups on file.  ? ?Orders:  ?No orders of the defined types were placed in this encounter. ? ?No orders of the defined types were placed in this encounter. ? ? ? ? Procedures: ?No procedures performed ? ? ?Clinical Data: ?No additional findings. ? ? ?Subjective: ?Chief Complaint  ?Patient presents with  ? Lower Back - Follow-up  ?Patient presents today for follow up on his lower back. He never had the ESI with Dr.Newton. Patient was recently in the hospital and states that his back pain has been worse since. He said that he never received the calls or voicemail regarding scheduling for the [facility].  ? ? ? ?Review of Systems  ?All other systems reviewed and are negative. ? ? ?Objective: ?Vital Signs: There were no vitals taken for this visit. ? ?Physical Exam ?Constitutional:   ?    Appearance: Normal appearance.  ?Pulmonary:  ?   Effort: Pulmonary effort is normal.  ?Skin: ?   General: Skin is warm and dry.  ?Neurological:  ?   Mental Status: He is alert.  ? ? ?Ortho Exam ?Examination of his lower spine he has positive straight leg raise on the right he has good strength with resisted dorsiflexion plantarflexion inversion eversion.  Good strength with resisted flexion of his hip.  No pain with manipulation of his hip.  Pain is focused in the posterior back and radiates down the right leg he has had some intermittent sensation changes in the foot ?Specialty Comments:  ?MRI LUMBAR SPINE WITHOUT CONTRAST ?  ?TECHNIQUE: ?Multiplanar, multisequence MR imaging of the lumbar spine was ?performed. No intravenous contrast was administered. ?  ?COMPARISON:  Lumbar spine radiographs 04/14/2021. CTA chest, ?abdomen, and pelvis 06/18/2020. ?  ?FINDINGS: ?The study is motion degraded, moderately so on the axial sequences. ?  ?Segmentation: Standard. ?  ?Alignment: Trace retrolisthesis of L4 on L5. Straightening of the ?normal lumbar lordosis. ?  ?Vertebrae: No fracture, suspicious marrow lesion, or significant ?marrow edema. Mild chronic degenerative endplate changes at L5-S1. ?  ?Conus medullaris and cauda equina: Conus extends to the L1-2 level. ?Conus and cauda equina appear normal. ?  ?Paraspinal and other soft tissues: Unremarkable. ?  ?  Disc levels: ?  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 ?  ?T12-L1 and L1-2: Negative. ?  ?L2-3: Minimal disc bulging without stenosis. ?  ?L3-4: Mild disc bulging without significant stenosis. ?  ?L4-5: Mild disc desiccation. Circumferential disc bulging and mild ?facet hypertrophy result in mild spinal stenosis, mild bilateral ?lateral recess stenosis, and mild-to-moderate bilateral neural ?foraminal stenosis. ?  ?L5-S1: Disc desiccation. Disc bulging and mild facet hypertrophy ?result in mild right and moderate left neural  foraminal stenosis ?without spinal stenosis. ?  ?IMPRESSION: ?1. Motion degraded examination. ?2. 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 ?  ?  ?Electronically Signed ?  By: Sebastian Ache M.D. ?  On: 05/16/2021 18:00 ? ?Imaging: ?No results found. ? ? ?PMFS History: ?Patient Active Problem List  ? Diagnosis Date Noted  ? Nonischemic cardiomyopathy (HCC) 06/06/2021  ? Chest pain 06/01/2021  ? Chest pain of uncertain etiology 05/31/2021  ? Obesity, Class III, BMI 40-49.9 (morbid obesity) (HCC) 05/31/2021  ? Diabetes mellitus (HCC) 05/04/2021  ? Sleep apnea 05/04/2021  ? Hypertensive urgency 10/19/2020  ? GERD (gastroesophageal reflux disease) 02/09/2018  ? Hyperglycemia 02/08/2018  ? Atypical chest pain 02/08/2018  ? Hypokalemia 02/08/2018  ? Leukocytosis 02/08/2018  ? Intractable nausea and vomiting 02/08/2018  ? Mood disorder (HCC)   ? Hypertension   ? Anxiety   ? Hyperglycemia without ketosis   ? Manic disorder (HCC)   ? ?Past Medical History:  ?Diagnosis Date  ? Anxiety   ? Depression   ? Diabetes mellitus type 2 in obese [facility])   ? Hypertension   ? Manic disorder (HCC)   ? Obesity   ?  ?Family History  ?Problem Relation Age of Onset  ? Seizures Brother   ? Heart failure Neg Hx   ?  ?Past Surgical History:  ?Procedure Laterality Date  ? Dental procedure    ? ?Social History  ? ?Occupational History  ? Occupation: truck Hospital doctor  ?  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 Smoking status: Former  ?  Packs/day: 0.25  ?  Years: 32.00  ?  Pack years: 8.00  ?  Types: Cigarettes  ?  Quit date: 04/2021  ?  Years since quitting: 0.1  ? Smokeless tobacco: Never  ?Vaping Use  ? Vaping Use: Never used  ?Substance and Sexual Activity  ? Alcohol use: Yes  ?  Comment: occasional  ? Drug use: Yes  ?  Types: Marijuana  ?  Comment: routinely  ? Sexual activity: Not on  file  ? ? ? ? ? ? ?
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
## 2021-06-14 ENCOUNTER — Ambulatory Visit (HOSPITAL_COMMUNITY)
Admit: 2021-06-14 | Discharge: 2021-06-14 | Disposition: A | Discharge status: Home / Self Care | Payer: 59 | Attending: Physician Assistant | Admitting: Physician Assistant

## 2021-06-14 ENCOUNTER — Encounter (HOSPITAL_COMMUNITY): Payer: Self-pay

## 2021-06-14 ENCOUNTER — Other Ambulatory Visit (HOSPITAL_BASED_OUTPATIENT_CLINIC_OR_DEPARTMENT_OTHER): Payer: Self-pay

## 2021-06-14 VITALS — BP 152/106 | HR 88 | Wt 305.4 lb

## 2021-06-14 DIAGNOSIS — G5601 Carpal tunnel syndrome, right upper limb: Secondary | ICD-10-CM | POA: Insufficient documentation

## 2021-06-14 DIAGNOSIS — I428 Other cardiomyopathies: Secondary | ICD-10-CM

## 2021-06-14 DIAGNOSIS — E669 Obesity, unspecified: Secondary | ICD-10-CM | POA: Insufficient documentation

## 2021-06-14 DIAGNOSIS — F1410 Cocaine abuse, uncomplicated: Secondary | ICD-10-CM | POA: Insufficient documentation

## 2021-06-14 DIAGNOSIS — F1210 Cannabis abuse, uncomplicated: Secondary | ICD-10-CM | POA: Insufficient documentation

## 2021-06-14 DIAGNOSIS — I2510 Atherosclerotic heart disease of native coronary artery without angina pectoris: Secondary | ICD-10-CM | POA: Insufficient documentation

## 2021-06-14 DIAGNOSIS — M545 Low back pain, unspecified: Secondary | ICD-10-CM | POA: Insufficient documentation

## 2021-06-14 DIAGNOSIS — Z713 Dietary counseling and surveillance: Secondary | ICD-10-CM | POA: Diagnosis not present

## 2021-06-14 DIAGNOSIS — I110 Hypertensive heart disease with heart failure: Secondary | ICD-10-CM | POA: Diagnosis present

## 2021-06-14 DIAGNOSIS — Z87891 Personal history of nicotine dependence: Secondary | ICD-10-CM | POA: Insufficient documentation

## 2021-06-14 DIAGNOSIS — M48061 Spinal stenosis, lumbar region without neurogenic claudication: Secondary | ICD-10-CM | POA: Insufficient documentation

## 2021-06-14 DIAGNOSIS — G4733 Obstructive sleep apnea (adult) (pediatric): Secondary | ICD-10-CM | POA: Insufficient documentation

## 2021-06-14 DIAGNOSIS — I5021 Acute systolic (congestive) heart failure: Secondary | ICD-10-CM | POA: Insufficient documentation

## 2021-06-14 DIAGNOSIS — Z9989 Dependence on other enabling machines and devices: Secondary | ICD-10-CM | POA: Diagnosis not present

## 2021-06-14 DIAGNOSIS — Z6841 Body Mass Index (BMI) 40.0 and over, adult: Secondary | ICD-10-CM | POA: Insufficient documentation

## 2021-06-14 DIAGNOSIS — I340 Nonrheumatic mitral (valve) insufficiency: Secondary | ICD-10-CM | POA: Insufficient documentation

## 2021-06-14 DIAGNOSIS — G8929 Other chronic pain: Secondary | ICD-10-CM | POA: Diagnosis not present

## 2021-06-14 DIAGNOSIS — I10 Essential (primary) hypertension: Secondary | ICD-10-CM

## 2021-06-14 DIAGNOSIS — Z8249 Family history of ischemic heart disease and other diseases of the circulatory system: Secondary | ICD-10-CM | POA: Insufficient documentation

## 2021-06-14 DIAGNOSIS — E119 Type 2 diabetes mellitus without complications: Secondary | ICD-10-CM | POA: Insufficient documentation

## 2021-06-14 DIAGNOSIS — Z7984 Long term (current) use of oral hypoglycemic drugs: Secondary | ICD-10-CM | POA: Diagnosis not present

## 2021-06-14 DIAGNOSIS — Z79899 Other long term (current) drug therapy: Secondary | ICD-10-CM | POA: Diagnosis not present

## 2021-06-14 DIAGNOSIS — E0800 Diabetes mellitus due to underlying condition with hyperosmolarity without nonketotic hyperglycemic-hyperosmolar coma (NKHHC): Secondary | ICD-10-CM | POA: Diagnosis not present

## 2021-06-14 LAB — BASIC METABOLIC PANEL
Anion gap: 8 (ref 5–15)
BUN: 11 mg/dL (ref 6–20)
CO2: 27 mmol/L (ref 22–32)
Calcium: 9 mg/dL (ref 8.9–10.3)
Chloride: 105 mmol/L (ref 98–111)
Creatinine, Ser: 1.09 mg/dL (ref 0.61–1.24)
GFR, Estimated: >60 mL/min (ref >60)
Glucose, Bld: 104 mg/dL — ABNORMAL HIGH (ref 70–99)
Potassium: 3.9 mmol/L (ref 3.5–5.1)
Sodium: 140 mmol/L (ref 135–145)

## 2021-06-14 LAB — BRAIN NATRIURETIC PEPTIDE: B Natriuretic Peptide: 145.5 pg/mL — ABNORMAL HIGH (ref 0.0–100.0)

## 2021-06-14 MED ORDER — DAPAGLIFLOZIN PROPANEDIOL 10 MG PO TABS
10.0000 mg | ORAL_TABLET | Freq: Every day | ORAL | 6 refills | Status: DC
  Filled 2021-06-14: qty 30, 30d supply, fill #0
  Filled 2021-06-28: qty 90, 90d supply, fill #0
  Filled 2021-08-29: qty 90, 90d supply, fill #1
  Filled ????-??-??: fill #0
  Filled ????-??-??: fill #1

## 2021-06-14 MED ORDER — ENTRESTO 97-103 MG PO TABS
1.0000 | ORAL_TABLET | Freq: Two times a day (BID) | ORAL | 6 refills | Status: DC
  Filled 2021-06-14: qty 60, 30d supply, fill #0
  Filled 2021-06-14: qty 180, 90d supply, fill #0
  Filled 2021-09-09: qty 180, 90d supply, fill #1
  Filled 2021-12-16: qty 60, 30d supply, fill #2

## 2021-06-14 NOTE — Patient Instructions (Addendum)
Increase Entresto to 97/103 mg Twice daily  ? ?Increase Furosemide to 40 mg (2 tabs) Twice daily FOR 2 DAYS ONLY, then back to 20 mg Twice daily  ? ?Take an extra 20 meq (1 tab) of Potassium FOR 2 DAYS, with the increase Furosemide dose ? ?Labs done today, your results will be available in MyChart, we will contact you for abnormal readings. ? ?Your physician recommends that you return for lab work in: 1 week ? ?You have been referred to Nutritionalist, they will call you for an appointment ? ?You have been referred to Cardiac Rehab, they will call you for an appointment ? ?Do the following things EVERYDAY: ?Weigh yourself in the morning before breakfast. Write it down and keep it in a log. ?Take your medicines as prescribed ?Eat low salt foods--Limit salt (sodium) to 2000 mg per day.  ?Stay as active as you can everyday ?Limit all fluids for the day to less than 2 liters ? ?Thank you for allowing [place] to provider your heart failure care after your recent hospitalization. Please follow-up with general cardiology as scheduled on 07/05/21 and with our [facility] (Dr Gala Romney) with an echocardiogram in 3 months ? ?If you have any questions, issues, or concerns before your next appointment please call our office at [phone], opt. 2 and leave a message for the triage nurse. ? ?

## 2021-06-14 NOTE — Addendum Note (Signed)
Encounter addended by: Andrey Farmer, PA-C on: 06/14/2021 1:44 PM ? Actions taken: Order Reconciliation Section accessed

## 2021-06-14 NOTE — Addendum Note (Signed)
Encounter addended by: Andrey Farmer, PA-C on: 06/14/2021 3:48 PM ? Actions taken: Order Reconciliation Section accessed, Clinical Note Signed

## 2021-06-15 ENCOUNTER — Other Ambulatory Visit (HOSPITAL_BASED_OUTPATIENT_CLINIC_OR_DEPARTMENT_OTHER): Payer: Self-pay | Admitting: Family Medicine

## 2021-06-15 ENCOUNTER — Other Ambulatory Visit: Payer: Self-pay | Admitting: Physician Assistant

## 2021-06-16 ENCOUNTER — Other Ambulatory Visit (HOSPITAL_BASED_OUTPATIENT_CLINIC_OR_DEPARTMENT_OTHER): Payer: Self-pay

## 2021-06-16 ENCOUNTER — Encounter (HOSPITAL_BASED_OUTPATIENT_CLINIC_OR_DEPARTMENT_OTHER): Payer: Self-pay | Admitting: Pharmacist

## 2021-06-16 MED ORDER — BUSPIRONE HCL 15 MG PO TABS
15.0000 mg | ORAL_TABLET | Freq: Three times a day (TID) | ORAL | 0 refills | Status: DC | PRN
  Filled 2021-06-16 – 2021-06-28 (×2): qty 90, 30d supply, fill #0

## 2021-06-16 MED ORDER — BLOOD GLUCOSE MONITOR KIT
PACK | 0 refills | Status: DC
Start: 2021-06-16 — End: 2021-06-28

## 2021-06-21 ENCOUNTER — Other Ambulatory Visit (HOSPITAL_COMMUNITY): Payer: 59

## 2021-06-26 ENCOUNTER — Encounter (HOSPITAL_COMMUNITY): Payer: Self-pay | Admitting: Internal Medicine

## 2021-06-27 ENCOUNTER — Other Ambulatory Visit (HOSPITAL_BASED_OUTPATIENT_CLINIC_OR_DEPARTMENT_OTHER): Payer: Self-pay

## 2021-06-28 ENCOUNTER — Other Ambulatory Visit (HOSPITAL_BASED_OUTPATIENT_CLINIC_OR_DEPARTMENT_OTHER): Payer: Self-pay | Admitting: Family Medicine

## 2021-06-28 ENCOUNTER — Ambulatory Visit (HOSPITAL_COMMUNITY)
Admission: RE | Admit: 2021-06-28 | Discharge: 2021-06-28 | Disposition: A | Discharge status: Home / Self Care | Payer: 59 | Source: Ambulatory Visit | Attending: Cardiology | Admitting: Cardiology

## 2021-06-28 ENCOUNTER — Other Ambulatory Visit (HOSPITAL_BASED_OUTPATIENT_CLINIC_OR_DEPARTMENT_OTHER): Payer: Self-pay

## 2021-06-28 DIAGNOSIS — I5021 Acute systolic (congestive) heart failure: Secondary | ICD-10-CM | POA: Insufficient documentation

## 2021-06-28 LAB — BASIC METABOLIC PANEL
Anion gap: 8 (ref 5–15)
BUN: 12 mg/dL (ref 6–20)
CO2: 26 mmol/L (ref 22–32)
Calcium: 9.2 mg/dL (ref 8.9–10.3)
Chloride: 103 mmol/L (ref 98–111)
Creatinine, Ser: 1.01 mg/dL (ref 0.61–1.24)
GFR, Estimated: >60 mL/min (ref >60)
Glucose, Bld: 181 mg/dL — ABNORMAL HIGH (ref 70–99)
Potassium: 3.2 mmol/L — ABNORMAL LOW (ref 3.5–5.1)
Sodium: 137 mmol/L (ref 135–145)

## 2021-06-28 MED ORDER — BLOOD GLUCOSE MONITOR KIT
PACK | 0 refills | Status: DC
Start: 2021-06-28 — End: 2022-09-07

## 2021-06-29 ENCOUNTER — Other Ambulatory Visit (HOSPITAL_BASED_OUTPATIENT_CLINIC_OR_DEPARTMENT_OTHER): Payer: Self-pay

## 2021-07-01 ENCOUNTER — Telehealth (HOSPITAL_COMMUNITY): Payer: Self-pay

## 2021-07-01 ENCOUNTER — Other Ambulatory Visit (HOSPITAL_BASED_OUTPATIENT_CLINIC_OR_DEPARTMENT_OTHER): Payer: Self-pay

## 2021-07-01 DIAGNOSIS — I428 Other cardiomyopathies: Secondary | ICD-10-CM

## 2021-07-01 MED ORDER — POTASSIUM CHLORIDE CRYS ER 20 MEQ PO TBCR
40.0000 meq | EXTENDED_RELEASE_TABLET | Freq: Every day | ORAL | 3 refills | Status: DC
Start: 2021-07-01 — End: 2021-09-22
  Filled 2021-07-01: qty 30, 15d supply, fill #0
  Filled 2021-07-12: qty 30, 15d supply, fill #1
  Filled 2021-08-02 – 2021-08-16 (×2): qty 40, 20d supply, fill #2
  Filled 2021-09-12: qty 20, 10d supply, fill #3

## 2021-07-01 MED ORDER — SPIRONOLACTONE 25 MG PO TABS
12.5000 mg | ORAL_TABLET | Freq: Every day | ORAL | 3 refills | Status: DC
  Filled 2021-07-01: qty 45, 90d supply, fill #0

## 2021-07-01 NOTE — Telephone Encounter (Signed)
Increased pt's K rx to 40 meq daily and placed and a  rx for spiro 12.5 mg daily. ?Pt's lab appointment for a repeat b-met has been place and the order has also been placed. ? ?Pt aware, agreeable, and verbalized understanding  ?

## 2021-07-01 NOTE — Telephone Encounter (Signed)
-----   Message from Andrey Farmer, [place] sent at 06/28/2021  1:30 PM EDT ----- ?His K is low at 3.2. Increase K to 40 mEq daily. Start spiro 12.5 mg daily. BMET in 1 week. ?

## 2021-07-03 NOTE — Progress Notes (Signed)
? ?Cardiology Clinic Note  ? ?Patient Name: Jordan Sparks ?Date of Encounter: 07/05/2021 ? ?Primary Care Provider:  [place], Blondell Reveal, MD ?Primary Cardiologist:  Elouise Munroe, MD ? ?Patient Profile  ?  ?Jordan Sparks 46 year old male presents to the clinic today for hospital follow-up of his hypertension, chest pain, and nonischemic cardiomyopathy. ? ?Past Medical History  ?  ?Past Medical History:  ?Diagnosis Date  ? Anxiety   ? Depression   ? Diabetes mellitus type 2 in obese [facility])   ? Hypertension   ? Manic disorder ([place])   ? Obesity   ? ?Past Surgical History:  ?Procedure Laterality Date  ? Dental procedure    ? ? ?Allergies ? ?Allergies  ?Allergen Reactions  ? Lisinopril Cough  ? ? ?History of Present Illness  ?  ?[name] has a PMH of longstanding uncontrolled hypertension (around 7 years), diabetes, polysubstance abuse, OSA on CPAP, and lumbar stenosis.  Echocardiogram 3/23 showed LVEF 25-30%, moderate LVH, mild mitral valve regurgitation, dilated IVC with an estimated right atrial pressure 15 mmHg.  Coronary CTA showed no evidence obstructive coronary artery disease, calcium score 24.  Is felt that his cardiomyopathy is related to hypertension.  Heart failure was planned for repeat echocardiogram 6/23 with plans to have cardiac MRI to assess for other cardiac issues. ? ?He presented to the emergency department 05/31/2021 and was discharged on 06/03/2021.  He presented with chest pain and shortness of breath.  He reported fatigue and diffuse subacute pains in his left shoulder, right arm, leg and back.  He was ruled out for ACS due to low troponin (24-22) and reassuring EKG.  He was encouraged to eat a heart healthy low-sodium diet.  His Entresto, amlodipine, carvedilol, and furosemide were continued.  His clonidine was discontinued.  His left shoulder pain appeared to be musculoskeletal in nature.  His chest discomfort was felt to be related to posterior costophrenic pain.   He was instructed to follow-up with orthopedics. ? ?He was seen for heart failure consultation on 06/14/2021.  During that time he reported improvement in his shortness of breath with exertion.  He denied orthopnea and PND.  He felt like he had gained weight.  He was noted to have abdominal bloating and lower extremity edema.  He admitted to dietary discretion (Chinese food).  He was interested in seeing nutritionist and reported medication compliance.  He struggled with continuing low back pain which was radiating down his right leg and also noted right wrist discomfort related to carpal tunnel syndrome.  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  His home blood pressures and ranging in the 355 systolic.  He reported not having a home scale.  He reported compliance with his CPAP.  He had stopped smoking cigarettes 3 months previously, smoked marijuana daily to help manage his back pain.  He also reported heavy cocaine use 2022.  He reported that he had not used cocaine since January 2023.  NYHA class IIIa.  His furosemide was increased to 40 mg twice daily x2 days and he was instructed to take an extra dose of potassium x2 days.  He was given a home scale.  He was also referred for cardiac rehab. ? ?He presents to the clinic today for follow-up evaluation and states he has been trying to maintain a low-sodium diet.  He reports that he is just occasionally lightly salting his food to taste and using Mrs. Dash more regularly at this time.  His  weights have remained stable.  He has been weighing regularly at home.  We reviewed his blood pressure today and he reports that his blood pressures at home have been in the 160-155/ 100s range.  He has contacted cardiac rehab and is in the process of starting.  He has been walking his dog regularly for about 20-30 minutes/day.  He is tolerating activity well.  He denies exertional chest pain.  He does note some left arm shoulder pain.  On exam he does have left trapezius  stiffness/muscle tension.  I recommended range of motion and massage exercises.  I will increase his amlodipine to 7.5 mg daily, have him continue his heart healthy low-sodium diet and walking regimen.  We will plan follow-up for 3 to 4 months. ? ?Today he denies chest pain, shortness of breath, lower extremity edema, fatigue, palpitations, melena, hematuria, hemoptysis, diaphoresis, weakness, presyncope, syncope, orthopnea, and PND. ? ? ?Home Medications  ?  ?Prior to Admission medications   ?Medication Sig Start Date End Date Taking? Authorizing Provider  ?amLODipine (NORVASC) 5 MG tablet Take 1 tablet (5 mg total) by mouth daily. 05/04/21   [place], Raymond J, MD  ?aspirin 81 MG chewable tablet Chew 1 tablet (81 mg total) by mouth daily. 06/03/21   Little Ishikawa, MD  ?blood glucose meter kit and supplies KIT Dispense based on patient and insurance preference. Use up to four times daily as directed. (FOR ICD-9 250.00, 250.01). 06/28/21   [place], Blondell Reveal, MD  ?buPROPion [facility] SR) 150 MG 12 hr tablet Take 1 tablet (150 mg total) by mouth 2 (two) times daily. 05/04/21 07/31/21  [place], Raymond J, MD  ?busPIRone (BUSPAR) 15 MG tablet Take 1 tablet (15 mg total) by mouth 3 (three) times daily as needed (anxiety). 06/16/21   [place], Raymond J, MD  ?carvedilol (COREG) 25 MG tablet Take 1 tablet (25 mg total) by mouth 2 (two) times daily with a meal. 06/03/21   Little Ishikawa, MD  ?cetirizine (ZYRTEC) 10 MG tablet Take 1 tablet (10 mg total) by mouth daily. 06/06/21   [place], Raymond J, MD  ?Cholecalciferol (VITAMIN D) 50 MCG (2000 UT) CAPS Take 2,000 Units by mouth daily.    [provider]  ?dapagliflozin propanediol (FARXIGA) 10 MG TABS tablet Take 1 tablet (10 mg total) by mouth daily. 06/14/21   Joette Catching, PA-C  ?FLUoxetine (PROZAC) 20 MG capsule Take 1 capsule (20 mg total) by mouth daily. 06/06/21   [place], Raymond J, MD  ?fluticasone [facility]) 50 MCG/ACT nasal spray Place 2  sprays into both nostrils daily. 05/15/21   [place], Raymond J, MD  ?furosemide (LASIX) 20 MG tablet Take 1 tablet (20 mg total) by mouth 2 (two) times daily. 06/03/21   Little Ishikawa, MD  ?gabapentin (NEURONTIN) 300 MG capsule Take 1 capsule (300 mg total) by mouth 2 (two) times daily. 06/05/21   [place], Blondell Reveal, MD  ?glipiZIDE (GLUCOTROL) 5 MG tablet Take 1 tablet (5 mg total) by mouth 2 (two) times daily. 05/04/21   [place], Raymond J, MD  ?guaiFENesin (MUCINEX) 600 MG 12 hr tablet Take 1 tablet (600 mg total) by mouth 2 (two) times daily. 06/06/21   [place], Raymond J, MD  ?HYDROcodone-acetaminophen (NORCO/VICODIN) 5-325 MG tablet Take 1 tablet by mouth every 6 (six) hours as needed for moderate pain. 06/09/21   Persons, Bevely Palmer, PA  ?lithium carbonate 300 MG capsule Take 1 capsule (300  mg total) by mouth 2 (two) times daily. 05/15/21   [place], Raymond J, MD  ?metFORMIN (GLUCOPHAGE) 1000 MG tablet Take 1 tablet (1,000 mg total) by mouth 2 (two) times daily. 05/04/21   [place], Raymond J, MD  ?Multiple Vitamin (MULTIVITAMIN WITH MINERALS) TABS tablet Take 1 tablet by mouth daily.    [provider]  ?potassium chloride SA (KLOR-CON M) 20 MEQ tablet Take 2 tablets (40 mEq total) by mouth daily. 07/01/21   Joette Catching, PA-C  ?rosuvastatin (CRESTOR) 5 MG tablet Take 1 tablet (5 mg total) by mouth daily. 06/04/21   Little Ishikawa, MD  ?sacubitril-valsartan (ENTRESTO) 97-103 MG Take 1 tablet by mouth 2 (two) times daily. 06/14/21   Joette Catching, PA-C  ?spironolactone (ALDACTONE) 25 MG tablet Take 0.5 tablets (12.5 mg total) by mouth daily. 07/01/21   Joette Catching, PA-C  ?traZODone (DESYREL) 100 MG tablet Take 50 mg by mouth at bedtime as needed for sleep. 09/15/20   [provider]  ?Vitamin D, Ergocalciferol, (DRISDOL) 1.25 MG (50000 UNIT) CAPS capsule Take 50,000 Units by mouth once a week. 07/27/20   [provider]  ? ? ?Family History  ?  ?Family History   ?Problem Relation Age of Onset  ? Seizures Brother   ? Heart failure Neg Hx   ? ?He indicated that the status of his brother is unknown. He indicated that the status of his neg hx is unknown. ? ?Social Histo

## 2021-07-04 ENCOUNTER — Telehealth (HOSPITAL_BASED_OUTPATIENT_CLINIC_OR_DEPARTMENT_OTHER): Payer: Self-pay | Admitting: Family Medicine

## 2021-07-04 ENCOUNTER — Telehealth (HOSPITAL_COMMUNITY): Payer: Self-pay

## 2021-07-04 NOTE — Telephone Encounter (Signed)
Received a fax from [employer] regarding pts equipment. "They have attempted several times and sent sleepgaid link for a facial scars to do the mask fitting which expired. They have voided the order due to no contact." ? ?Please advise. ?

## 2021-07-04 NOTE — Telephone Encounter (Signed)
Called patient to see if he is interested in the Cardiac Rehab Program. Patient expressed interest. Explained scheduling process and went over insurance, patient verbalized understanding.  ?

## 2021-07-04 NOTE — Telephone Encounter (Signed)
Pt insurance is active and benefits verified through [employer]. Co-pay $0.00, DED $0.00/$0.00 met, out of pocket $1,700.00/$1,155.66 met, co-insurance 0%. No pre-authorization required. Meggie/[employer], 07/04/21 @ 11:18AM, JRP#[id number] ?  ?Will contact patient to see if he is interested in the Cardiac Rehab Program. ?

## 2021-07-05 ENCOUNTER — Ambulatory Visit (INDEPENDENT_AMBULATORY_CARE_PROVIDER_SITE_OTHER): Payer: 59 | Admitting: General Practice

## 2021-07-05 ENCOUNTER — Encounter: Payer: Self-pay | Admitting: General Practice

## 2021-07-05 VITALS — BP 130/86 | HR 84 | Ht 70.0 in | Wt 304.4 lb

## 2021-07-05 DIAGNOSIS — I10 Essential (primary) hypertension: Secondary | ICD-10-CM | POA: Diagnosis not present

## 2021-07-05 DIAGNOSIS — I428 Other cardiomyopathies: Secondary | ICD-10-CM

## 2021-07-05 DIAGNOSIS — I2510 Atherosclerotic heart disease of native coronary artery without angina pectoris: Secondary | ICD-10-CM | POA: Diagnosis not present

## 2021-07-05 DIAGNOSIS — Z6841 Body Mass Index (BMI) 40.0 and over, adult: Secondary | ICD-10-CM

## 2021-07-05 DIAGNOSIS — E0800 Diabetes mellitus due to underlying condition with hyperosmolarity without nonketotic hyperglycemic-hyperosmolar coma (NKHHC): Secondary | ICD-10-CM

## 2021-07-05 DIAGNOSIS — E782 Mixed hyperlipidemia: Secondary | ICD-10-CM

## 2021-07-05 MED ORDER — AMLODIPINE BESYLATE 5 MG PO TABS: 7.5000 mg | ORAL_TABLET | Freq: Every day | ORAL | 6 refills | Status: DC

## 2021-07-05 NOTE — Patient Instructions (Signed)
Medication Instructions:  ?INCREASE AMLODIPINE 7.5MG  (1.5 TAB DAILY) ? ?*If you need a refill on your cardiac medications before your next appointment, please call your pharmacy* ? ?Lab Work:   Testing/Procedures:  ?NONE    NONE ? ?If you have labs (blood work) drawn today and your tests are completely normal, you will receive your results only by: ?MyChart Message (if you have MyChart) OR  A paper copy in the mail ?If you have any lab test that is abnormal or we need to change your treatment, we will call you to review the results. ? ?Special Instructions ?PLEASE READ AND FOLLOW SALTY 6-ATTACHED-1,800mg  daily ? ?PLEASE INCREASE PHYSICAL ACTIVITY AS TOLERATED-CONTINUE WALKING  ? ?Follow-Up: ?Your next appointment:  3-4  month(s) In Person with Parke Poisson, MD  or Edd Fabian, FNP    1 ? ?At [facility], you and your health needs are our priority.  As part of our continuing mission to provide you with exceptional heart care, we have created designated Provider Care Teams.  These Care Teams include your primary Cardiologist (physician) and Advanced Practice Providers (APPs -  Physician Assistants and Nurse Practitioners) who all work together to provide you with the care you need, when you need it. ? ? ?Important Information About Sugar ? ? ? ? ? ? ?        6 SALTY THINGS TO AVOID     1,800MG  DAILY ? ? ? ? ? ? ?

## 2021-07-05 NOTE — Telephone Encounter (Signed)
[employer] was unable to reach patient. They are canceling the order due to not receiving facial measurements for the mask. AS, CMA ?

## 2021-07-06 ENCOUNTER — Encounter (HOSPITAL_COMMUNITY): Payer: Self-pay | Admitting: *Deleted

## 2021-07-06 NOTE — Progress Notes (Signed)
Received referral post hospitalization from Dr. Haroldine Laws for chronic systolic heart failure.Clinical review of pt follow up appt on 4/4 with Marlyce Huge PA with Dr. Haroldine Laws - cardiologist/CHF office note.  Also reviewed progress notes from PCP- [place] and Cardiology - Margaretann Loveless. Pt appropriate for scheduling for on site cardiac rehab and/or enrollment in Virtual Cardiac Rehab. Will forward to staff for follow up. Cherre Huger, BSN ?Cardiac and Pulmonary Rehab Nurse Navigator  ? ?

## 2021-07-07 ENCOUNTER — Encounter: Payer: Self-pay | Admitting: Physical Medicine and Rehabilitation

## 2021-07-07 ENCOUNTER — Ambulatory Visit (INDEPENDENT_AMBULATORY_CARE_PROVIDER_SITE_OTHER): Payer: 59 | Admitting: Physical Medicine and Rehabilitation

## 2021-07-07 ENCOUNTER — Ambulatory Visit: Payer: Self-pay

## 2021-07-07 VITALS — BP 168/102 | HR 108

## 2021-07-07 DIAGNOSIS — M5416 Radiculopathy, lumbar region: Secondary | ICD-10-CM | POA: Diagnosis not present

## 2021-07-07 MED ORDER — METHYLPREDNISOLONE ACETATE 80 MG/ML IJ SUSP
80.0000 mg | Freq: Once | INTRAMUSCULAR | Status: AC
  Administered 2021-07-07: 80 mg

## 2021-07-07 NOTE — Patient Instructions (Signed)
[facility] Physiatry Discharge Instructions  *At any time if you have questions or concerns they can be answered by calling [phone]  All Patients: You may experience an increase in your symptoms for the first 2 days (it can take 2 days to 2 weeks for the steroid/cortisone to have its maximal effect). You may use ice to the site for the first 24 hours; 20 minutes on and 20 minutes off and may use heat after that time. You may resume and continue your current pain medications. If you need a refill please contact the prescribing physician. You may resume your medications if any were stopped for the procedure. You may shower but no swimming, tub bath or Jacuzzi for 24 hours. Please remove bandage after 4 hours. You may resume light activities as tolerated. If you had Spine Injection, you should not drive for the next 3 hours due to anesthetics used in the procedure. Please have someone drive for you.  *If you have had sedation, Valium, Xanax, or lorazepam: Do not drive or use public transportation for 24 hours, do not operating hazardous machinery or make important personal/business decisions for 24 hours.  POSSIBLE STEROID SIDE EFFECTS: If experienced these should only last for a short period. Change in menstrual flow  Edema in (swelling)  Increased appetite Skin flushing (redness)  Skin rash/acne  Thrush (oral) Vaginitis    Increased sweating  Depression Increased blood glucose levels Cramping and leg/calf  Euphoria (feeling happy)  POSSIBLE PROCEDURE SIDE EFFECTS: Please call our office if concerned. Increased pain Increased numbness/tingling  Headache Nausea/vomiting Hematoma (bruising/bleeding) Edema (swelling at the site) Weakness  Infection (red/drainage at site) Fever greater than 100.5F  *In the event of a headache after epidural steroid injection: Drink plenty of fluids, especially water and try to lay flat when possible. If the headache does not get better after a few days  or as always if concerned please call the office. Dr. Stuart McGill  "The Big Three" that will safely increase your endurance and protect your back: modified curl-up, side bridge, and bird dog.  1. Modified Curl-Up Lie your back with one knee bent and one knee straight, this puts your pelvis in a neutral position and the muscles of your core in an optimal alignment of pull to avoid strain on the low back. Place your hands under the arch of your low back and ensure that this arch is maintained throughout the curl-up. Start by bracing your abdomen; this is different from flexing your abs, bear down through your belly. Now make sure you can take a breath in and a breath out while maintaining this brace. If you cannot, stop there and practice doing just that until you've got it mastered! Now, pretend that your spine in your neck and your upper back are cemented together and do not move independently. Pick a spot on the ceiling and focus your gaze there, lift your shoulder blades about 30 off the floor and slowly return to the start position. Take note of your neck, and ensure that your chin isn't poking forward when you do a curl up. If you're struggling with that, focus on making a double chin. Perform 3 sets of 10-12.  2. Side Bridge Lie on your side and prop yourself up on your elbow. Ensure that your elbow is directly under your shoulder to avoid any unnecessary strain through your shoulder joint. With your legs straight, place your top foot on the ground in front of your bottom foot. Place your   top hand on your bottom shoulder. While maintaining the natural curve of your spine, that is to say, be sure that your upper body isn't twisted or leaning forward, brace your abdomen, squeeze through your gluteals (clench your bum), and lift your hips up off the ground. Don't forget to breathe! Hold for 8-10 seconds, repeat 3 times. As the exercise becomes easier, increase the number of repetitions as opposed to the  length of time. There are a number of ways to modify this exercise in order to increase or decrease the difficulty such as the example below on the right. If it's not challenging enough, try putting that top hand on your top hip, or straight up in the air, but again, be sure your body stays straight!  3. Bird Dog Start on your hands and knees with your hands shoulder width apart directly under your shoulders, and knees hip width apart directly under your hips. Maintain a neutral spine. Brace through your abdomen and squeeze your gluteals. Ensure you can maintain this while you take a breath in and out. Lift your right arm in front until it's level with your shoulder, squeezing the muscles between your shoulder blades as you do so. At the same time, extend your left leg straight back until it is level with your hips, squeezing your gluteals, and keeping your hips square to the floor. Return to the starting position in a slow and controlled manner, and perform the same action with the left arm and right leg. That is one repetition. Perform 3 sets of 8-10 repetitions. As this exercise becomes easy, focus on co-contracting the muscles of your forearm and arms while you extend, the same goes for the muscles of your legs. For an additional challenge, instead of putting your hand and knee back down on the ground between reps, try just sweeping the floor and performing the next rep right away, or draw a square with your arm and leg and then sweep the floor.  *Avoid exercises that forward flex the spine or extend the spine.   

## 2021-07-07 NOTE — Progress Notes (Signed)
? ?Jordan Sparks - 46 y.o. male MRN [id number]  Date of birth: 1975-12-31 ? ?Office Visit Note: ?Visit Date: 07/07/2021 ?PCP: de [place], Raymond J, MD ?Referred by: de [place], Raymond J, MD ? ?Subjective: ?Chief Complaint  ?Patient presents with  ? Lower Back - Pain  ? Right Leg - Pain  ? ?HPI:  Jordan Sparks is a 46 y.o. male who comes in today at the request of Dr. Norlene Campbell for planned Right L5-S1 Lumbar Interlaminar epidural steroid injection with fluoroscopic guidance.  The patient has failed conservative care including home exercise, medications, time and activity modification.  This injection will be diagnostic and hopefully therapeutic.  Please see requesting physician notes for further details and justification. ? ? ?ROS Otherwise per HPI. ? ?Assessment & Plan: ?Visit Diagnoses:  ?  ICD-10-CM   ?1. Lumbar radiculopathy  M54.16 XR C-ARM NO REPORT  ?  Epidural Steroid injection  ?  methylPREDNISolone acetate (DEPO-MEDROL) injection 80 mg  ?  ?  ?Plan: No additional findings.  ? ?Meds & Orders:  ?Meds ordered this encounter  ?Medications  ? methylPREDNISolone acetate (DEPO-MEDROL) injection 80 mg  ?  ?Orders Placed This Encounter  ?Procedures  ? XR C-ARM NO REPORT  ? Epidural Steroid injection  ?  ?Follow-up: Return for visit to requesting provider as needed.  ? ?Procedures: ?No procedures performed  ?Lumbar Epidural Steroid Injection - Interlaminar Approach with Fluoroscopic Guidance ? ?Patient: Jordan Sparks      ?Date of Birth: 1976/03/06 ?MRN: [id number] ?PCP: de [place], Raymond J, MD      ?Visit Date: 07/07/2021 ?  ?Universal Protocol:    ? ?Consent Given By: the patient ? ?Position: PRONE ? ?Additional Comments: ?Vital signs were monitored before and after the procedure. ?Patient was prepped and draped in the usual sterile fashion. ?The correct patient, procedure, and site was verified. ? ? ?Injection Procedure Details:  ? ?Procedure diagnoses: Lumbar radiculopathy [M54.16]   ? ?Meds Administered:  ?Meds ordered this encounter  ?Medications  ? methylPREDNISolone acetate (DEPO-MEDROL) injection 80 mg  ?  ? ?Laterality: Right ? ?Location/Site:  L5-S1 ? ?Needle: 3.5 in., 20 ga. Tuohy ? ?Needle Placement: Paramedian epidural ? ?Findings:  ? -Comments: Excellent flow of contrast into the epidural space. ? ?Procedure Details: ?Using a paramedian approach from the side mentioned above, the region overlying the inferior lamina was localized under fluoroscopic visualization and the soft tissues overlying this structure were infiltrated with 4 ml. of 1% Lidocaine without Epinephrine. The Tuohy needle was inserted into the epidural space using a paramedian approach.  ? ?The epidural space was localized using loss of resistance along with counter oblique bi-planar fluoroscopic views.  After negative aspirate for air, blood, and CSF, a 2 ml. volume of Isovue-250 was injected into the epidural space and the flow of contrast was observed. Radiographs were obtained for documentation purposes.   ? ?The injectate was administered into the level noted above. ? ? ?Additional Comments:  ?The patient tolerated the procedure well ?Dressing: 2 x 2 sterile gauze and Band-Aid ?  ? ?Post-procedure details: ?Patient was observed during the procedure. ?Post-procedure instructions were reviewed. ? ?Patient left the clinic in stable condition.  ? ?Clinical History: ?MRI LUMBAR SPINE WITHOUT CONTRAST ?  ?TECHNIQUE: ?Multiplanar, multisequence MR imaging of the lumbar spine was ?performed. No intravenous contrast was administered. ?  ?COMPARISON:  Lumbar spine radiographs 04/14/2021. CTA chest, ?abdomen, and pelvis 06/18/2020. ?  ?FINDINGS: ?The study is motion degraded, moderately so on the axial sequences. ?  ?  Segmentation: Standard. ?  ?Alignment: Trace retrolisthesis of L4 on L5. Straightening of the ?normal lumbar lordosis. ?  ?Vertebrae: No fracture, suspicious marrow lesion, or significant ?marrow edema. Mild  chronic degenerative endplate changes at L5-S1. ?  ?Conus medullaris and cauda equina: Conus extends to the L1-2 level. ?Conus and cauda equina appear normal. ?  ?Paraspinal and other soft tissues: Unremarkable. ?  ?Disc levels: ?  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 ?  ?T12-L1 and L1-2: Negative. ?  ?L2-3: Minimal disc bulging without stenosis. ?  ?L3-4: Mild disc bulging without significant stenosis. ?  ?L4-5: Mild disc desiccation. Circumferential disc bulging and mild ?facet hypertrophy result in mild spinal stenosis, mild bilateral ?lateral recess stenosis, and mild-to-moderate bilateral neural ?foraminal stenosis. ?  ?L5-S1: Disc desiccation. Disc bulging and mild facet hypertrophy ?result in mild right and moderate left neural foraminal stenosis ?without spinal stenosis. ?  ?IMPRESSION: ?1. Motion degraded examination. ?2. 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 ?  ?  ?Electronically Signed ?  By: Sebastian Ache M.D. ?  On: 05/16/2021 18:00  ? ? ? ?Objective:  VS:  HT:    WT:   BMI:     BP:(!) 168/102  HR:(!) 108bpm  TEMP: ( )  RESP:  ?Physical Exam ?Vitals and nursing note reviewed.  ?Constitutional:   ?   General: He is not in acute distress. ?   Appearance: Normal appearance. He is not ill-appearing.  ?HENT:  ?   Head: Normocephalic and atraumatic.  ?   Right Ear: External ear normal.  ?   Left Ear: External ear normal.  ?   Nose: No congestion.  ?Eyes:  ?   Extraocular Movements: Extraocular movements intact.  ?Cardiovascular:  ?   Rate and Rhythm: Normal rate.  ?   Pulses: Normal pulses.  ?Pulmonary:  ?   Effort: Pulmonary effort is normal. No respiratory distress.  ?Abdominal:  ?   General: There is no distension.  ?   Palpations: Abdomen is soft.  ?Musculoskeletal:     ?   General: No tenderness or signs of  injury.  ?   Cervical back: Neck supple.  ?   Right lower leg: No edema.  ?   Left lower leg: No edema.  ?   Comments: Patient has good distal strength without clonus.  ?Skin: ?   Findings: No erythema or rash.  ?Neurological:  ?   General: No focal deficit present.  ?   Mental Status: He is alert and oriented to person, place, and time.  ?   Sensory: No sensory deficit.  ?   Motor: No weakness or abnormal muscle tone.  ?   Coordination: Coordination normal.  ?Psychiatric:     ?   Mood and Affect: Mood normal.     ?   Behavior: Behavior normal.  ?  ? ?Imaging: ?No results found. ?

## 2021-07-07 NOTE — Progress Notes (Signed)
Pt state lower back pain that travels dow to his right leg. Pt state walking and standing makes the pain worse. Pt state he takes pain meds to help ease his pain. ? ?Numeric Pain Rating Scale and Functional Assessment ?Average Pain 7 ? ? ?In the last MONTH (on 0-10 scale) has pain interfered with the following? ? ?1. General activity like being  able to carry out your everyday physical activities such as walking, climbing stairs, carrying groceries, or moving a chair?  ?Rating(10) ? ? ?+Driver, -BT, -Dye Allergies. ? ?

## 2021-07-07 NOTE — Procedures (Signed)
Lumbar Epidural Steroid Injection - Interlaminar Approach with Fluoroscopic Guidance ? ?Patient: Jordan Sparks      ?Date of Birth: [birth year] ?MRN: [id number] ?PCP: de [place], Raymond J, MD      ?Visit Date: 07/07/2021 ?  ?Universal Protocol:    ? ?Consent Given By: the patient ? ?Position: PRONE ? ?Additional Comments: ?Vital signs were monitored before and after the procedure. ?Patient was prepped and draped in the usual sterile fashion. ?The correct patient, procedure, and site was verified. ? ? ?Injection Procedure Details:  ? ?Procedure diagnoses: Lumbar radiculopathy [M54.16]  ? ?Meds Administered:  ?Meds ordered this encounter  ?Medications  ? methylPREDNISolone acetate (DEPO-MEDROL) injection 80 mg  ?  ? ?Laterality: Right ? ?Location/Site:  L5-S1 ? ?Needle: 3.5 in., 20 ga. Tuohy ? ?Needle Placement: Paramedian epidural ? ?Findings:  ? -Comments: Excellent flow of contrast into the epidural space. ? ?Procedure Details: ?Using a paramedian approach from the side mentioned above, the region overlying the inferior lamina was localized under fluoroscopic visualization and the soft tissues overlying this structure were infiltrated with 4 ml. of 1% Lidocaine without Epinephrine. The Tuohy needle was inserted into the epidural space using a paramedian approach.  ? ?The epidural space was localized using loss of resistance along with counter oblique bi-planar fluoroscopic views.  After negative aspirate for air, blood, and CSF, a 2 ml. volume of Isovue-250 was injected into the epidural space and the flow of contrast was observed. Radiographs were obtained for documentation purposes.   ? ?The injectate was administered into the level noted above. ? ? ?Additional Comments:  ?The patient tolerated the procedure well ?Dressing: 2 x 2 sterile gauze and Band-Aid ?  ? ?Post-procedure details: ?Patient was observed during the procedure. ?Post-procedure instructions were reviewed. ? ?Patient left the clinic in stable  condition. ?

## 2021-07-08 ENCOUNTER — Ambulatory Visit (HOSPITAL_COMMUNITY)
Admission: RE | Admit: 2021-07-08 | Discharge: 2021-07-08 | Disposition: A | Discharge status: Home / Self Care | Payer: 59 | Source: Ambulatory Visit | Attending: Internal Medicine | Admitting: Internal Medicine

## 2021-07-08 DIAGNOSIS — I428 Other cardiomyopathies: Secondary | ICD-10-CM | POA: Diagnosis present

## 2021-07-08 LAB — BASIC METABOLIC PANEL
Anion gap: 6 (ref 5–15)
BUN: 13 mg/dL (ref 6–20)
CO2: 25 mmol/L (ref 22–32)
Calcium: 9.2 mg/dL (ref 8.9–10.3)
Chloride: 106 mmol/L (ref 98–111)
Creatinine, Ser: 0.99 mg/dL (ref 0.61–1.24)
GFR, Estimated: >60 mL/min (ref >60)
Glucose, Bld: 129 mg/dL — ABNORMAL HIGH (ref 70–99)
Potassium: 4 mmol/L (ref 3.5–5.1)
Sodium: 137 mmol/L (ref 135–145)

## 2021-07-11 ENCOUNTER — Encounter (HOSPITAL_BASED_OUTPATIENT_CLINIC_OR_DEPARTMENT_OTHER): Payer: Self-pay

## 2021-07-11 ENCOUNTER — Other Ambulatory Visit (HOSPITAL_BASED_OUTPATIENT_CLINIC_OR_DEPARTMENT_OTHER): Payer: Self-pay

## 2021-07-11 ENCOUNTER — Telehealth (HOSPITAL_COMMUNITY): Payer: Self-pay

## 2021-07-11 DIAGNOSIS — I5021 Acute systolic (congestive) heart failure: Secondary | ICD-10-CM

## 2021-07-11 MED ORDER — SPIRONOLACTONE 25 MG PO TABS
25.0000 mg | ORAL_TABLET | Freq: Every day | ORAL | 3 refills | Status: DC
  Filled 2021-07-11 – 2021-08-29 (×4): qty 90, 90d supply, fill #0
  Filled 2021-08-30: qty 30, 30d supply, fill #0

## 2021-07-11 NOTE — Telephone Encounter (Addendum)
Pt aware, agreeable, and verbalized understanding ?Labs scheduled Rx updated ? ? ?----- Message from Joette Catching, PA-C sent at 07/08/2021  6:22 PM EDT ----- ?Stable. Increase spiro to 25 mg daily. Repeat BMET 2 weeks. ?

## 2021-07-12 ENCOUNTER — Other Ambulatory Visit (HOSPITAL_BASED_OUTPATIENT_CLINIC_OR_DEPARTMENT_OTHER): Payer: Self-pay | Admitting: Family Medicine

## 2021-07-12 ENCOUNTER — Other Ambulatory Visit (HOSPITAL_BASED_OUTPATIENT_CLINIC_OR_DEPARTMENT_OTHER): Payer: Self-pay

## 2021-07-13 ENCOUNTER — Other Ambulatory Visit (HOSPITAL_BASED_OUTPATIENT_CLINIC_OR_DEPARTMENT_OTHER): Payer: Self-pay

## 2021-07-13 MED ORDER — GLIPIZIDE 5 MG PO TABS
5.0000 mg | ORAL_TABLET | Freq: Two times a day (BID) | ORAL | 1 refills | Status: DC
  Filled 2021-07-13: qty 60, 30d supply, fill #0
  Filled 2021-07-31 – 2021-08-16 (×3): qty 60, 30d supply, fill #1

## 2021-07-13 MED ORDER — GABAPENTIN 300 MG PO CAPS
300.0000 mg | ORAL_CAPSULE | Freq: Two times a day (BID) | ORAL | 0 refills | Status: DC
Start: 2021-07-13 — End: 2021-08-29
  Filled 2021-07-13: qty 60, 30d supply, fill #0

## 2021-07-18 ENCOUNTER — Ambulatory Visit (INDEPENDENT_AMBULATORY_CARE_PROVIDER_SITE_OTHER): Payer: 59 | Admitting: Family Medicine

## 2021-07-18 ENCOUNTER — Encounter (HOSPITAL_BASED_OUTPATIENT_CLINIC_OR_DEPARTMENT_OTHER): Payer: Self-pay | Admitting: Family Medicine

## 2021-07-18 ENCOUNTER — Other Ambulatory Visit (HOSPITAL_BASED_OUTPATIENT_CLINIC_OR_DEPARTMENT_OTHER): Payer: Self-pay

## 2021-07-18 VITALS — BP 143/100 | HR 92 | Ht 70.0 in | Wt 304.0 lb

## 2021-07-18 DIAGNOSIS — I10 Essential (primary) hypertension: Secondary | ICD-10-CM

## 2021-07-18 DIAGNOSIS — E119 Type 2 diabetes mellitus without complications: Secondary | ICD-10-CM | POA: Diagnosis not present

## 2021-07-18 MED ORDER — FUROSEMIDE 20 MG PO TABS
20.0000 mg | ORAL_TABLET | Freq: Two times a day (BID) | ORAL | 1 refills | Status: DC
  Filled 2021-07-18: qty 60, 30d supply, fill #0
  Filled 2021-07-31 – 2021-08-08 (×3): qty 60, 30d supply, fill #1
  Filled ????-??-??: fill #1

## 2021-07-18 NOTE — Progress Notes (Signed)
? ? ?  Procedures performed today:   ? ?None. ? ?Independent interpretation of notes and tests performed by another provider:  ? ?None. ? ?Brief History, Exam, Impression, and Recommendations:   ? ?BP (!) 143/100   Pulse 92   Ht 5\' 10"  (1.778 m)   Wt (!) 304 lb (137.9 kg)   SpO2 100%   BMI 43.62 kg/m?  ? ?Hypertension ?Blood pressure elevated in office today. Continues with medications as prescribed -amlodipine, carvedilol, spironolactone ?Still occasionally having some chest pain, typically lasts seconds to a couple minutes. No other symptoms when this occurs ?Recently saw cardiology, does have follow-up scheduled ?Recommend continuing with intermittent monitoring of blood pressure at home, DASH diet ?Continue with close follow-up with cardiology ? ?Diabetes mellitus (HCC) ?Most recent hemoglobin A1c had improved, but was still slightly above goal at 7.7% ?Patient continues with metformin, glipizide, also on Farxiga related to diabetes as well as underlying heart failure ?We will plan to recheck hemoglobin A1c in 1 to 2 months at next office visit.  Would expect further improvement in A1c given addition of Farxiga since last A1c check ? ?Plan for follow-up in 1 to 2 months ? ? ?___________________________________________ ?Jordan Sparks de 03-17-2000, MD, ABFM, CAQSM ?Primary Care and Sports Medicine ?[facility] ?

## 2021-07-18 NOTE — Patient Instructions (Signed)
  Medication Instructions:  Your physician recommends that you continue on your current medications as directed. Please refer to the Current Medication list given to you today. --If you need a refill on any your medications before your next appointment, please call your pharmacy first. If no refills are authorized on file call the office.-- Lab Work: Your physician has recommended that you have lab work today: No If you have labs (blood work) drawn today and your tests are completely normal, you will receive your results via MyChart message OR a phone call from our staff.  Please ensure you check your voicemail in the event that you authorized detailed messages to be left on a delegated number. If you have any lab test that is abnormal or we need to change your treatment, we will call you to review the results.  Referrals/Procedures/Imaging: No  Follow-Up: Your next appointment:   Your physician recommends that you schedule a follow-up appointment in: 1-2 months with Dr. de Cuba.  You will receive a text message or e-mail with a link to a survey about your care and experience with us today! We would greatly appreciate your feedback!   Thanks for letting us be apart of your health journey!!  Primary Care and Sports Medicine   Dr. Raymond de Cuba   We encourage you to activate your patient portal called "MyChart".  Sign up information is provided on this After Visit Summary.  MyChart is used to connect with patients for Virtual Visits (Telemedicine).  Patients are able to view lab/test results, encounter notes, upcoming appointments, etc.  Non-urgent messages can be sent to your provider as well. To learn more about what you can do with MyChart, please visit --  [email or web address].    

## 2021-07-18 NOTE — Assessment & Plan Note (Signed)
Most recent hemoglobin A1c had improved, but was still slightly above goal at 7.7% ?Patient continues with metformin, glipizide, also on Farxiga related to diabetes as well as underlying heart failure ?We will plan to recheck hemoglobin A1c in 1 to 2 months at next office visit.  Would expect further improvement in A1c given addition of Farxiga since last A1c check ?

## 2021-07-18 NOTE — Assessment & Plan Note (Signed)
Blood pressure elevated in office today. Continues with medications as prescribed -amlodipine, carvedilol, spironolactone ?Still occasionally having some chest pain, typically lasts seconds to a couple minutes. No other symptoms when this occurs ?Recently saw cardiology, does have follow-up scheduled ?Recommend continuing with intermittent monitoring of blood pressure at home, DASH diet ?Continue with close follow-up with cardiology ?

## 2021-07-25 ENCOUNTER — Ambulatory Visit (HOSPITAL_COMMUNITY)
Admission: RE | Admit: 2021-07-25 | Discharge: 2021-07-25 | Disposition: A | Discharge status: Home / Self Care | Payer: 59 | Source: Ambulatory Visit | Attending: Internal Medicine | Admitting: Internal Medicine

## 2021-07-25 DIAGNOSIS — I5021 Acute systolic (congestive) heart failure: Secondary | ICD-10-CM | POA: Insufficient documentation

## 2021-07-25 LAB — BASIC METABOLIC PANEL
Anion gap: 8 (ref 5–15)
BUN: 11 mg/dL (ref 6–20)
CO2: 26 mmol/L (ref 22–32)
Calcium: 9.1 mg/dL (ref 8.9–10.3)
Chloride: 106 mmol/L (ref 98–111)
Creatinine, Ser: 1.14 mg/dL (ref 0.61–1.24)
GFR, Estimated: >60 mL/min (ref >60)
Glucose, Bld: 141 mg/dL — ABNORMAL HIGH (ref 70–99)
Potassium: 4.4 mmol/L (ref 3.5–5.1)
Sodium: 140 mmol/L (ref 135–145)

## 2021-07-29 ENCOUNTER — Telehealth: Payer: Self-pay | Admitting: Orthopaedic Surgery

## 2021-07-29 NOTE — Telephone Encounter (Signed)
Called pt 1X and left vm. Pt left a mychart message he need to be seen for right side pains and left shoulder pains with Dr. Cleophas Dunker

## 2021-07-31 ENCOUNTER — Other Ambulatory Visit (HOSPITAL_BASED_OUTPATIENT_CLINIC_OR_DEPARTMENT_OTHER): Payer: Self-pay | Admitting: Family Medicine

## 2021-08-01 ENCOUNTER — Other Ambulatory Visit (HOSPITAL_BASED_OUTPATIENT_CLINIC_OR_DEPARTMENT_OTHER): Payer: Self-pay

## 2021-08-01 MED ORDER — FLUTICASONE PROPIONATE 50 MCG/ACT NA SUSP
2.0000 | Freq: Every day | NASAL | 0 refills | Status: DC
  Filled 2021-08-01: qty 16, 30d supply, fill #0

## 2021-08-02 ENCOUNTER — Other Ambulatory Visit (HOSPITAL_BASED_OUTPATIENT_CLINIC_OR_DEPARTMENT_OTHER): Payer: Self-pay

## 2021-08-02 ENCOUNTER — Other Ambulatory Visit (HOSPITAL_BASED_OUTPATIENT_CLINIC_OR_DEPARTMENT_OTHER): Payer: Self-pay | Admitting: Family Medicine

## 2021-08-02 MED ORDER — FLUOXETINE HCL 20 MG PO CAPS
20.0000 mg | ORAL_CAPSULE | Freq: Every day | ORAL | 1 refills | Status: DC
Start: 2021-08-02 — End: 2021-10-08
  Filled 2021-08-02 – 2021-08-16 (×2): qty 30, 30d supply, fill #0
  Filled 2021-09-12: qty 30, 30d supply, fill #1

## 2021-08-03 ENCOUNTER — Other Ambulatory Visit (HOSPITAL_BASED_OUTPATIENT_CLINIC_OR_DEPARTMENT_OTHER): Payer: Self-pay

## 2021-08-09 ENCOUNTER — Other Ambulatory Visit (HOSPITAL_BASED_OUTPATIENT_CLINIC_OR_DEPARTMENT_OTHER): Payer: Self-pay

## 2021-08-15 ENCOUNTER — Other Ambulatory Visit (HOSPITAL_BASED_OUTPATIENT_CLINIC_OR_DEPARTMENT_OTHER): Payer: Self-pay

## 2021-08-16 ENCOUNTER — Other Ambulatory Visit (HOSPITAL_BASED_OUTPATIENT_CLINIC_OR_DEPARTMENT_OTHER): Payer: Self-pay | Admitting: Family Medicine

## 2021-08-16 ENCOUNTER — Telehealth (HOSPITAL_COMMUNITY): Payer: Self-pay

## 2021-08-16 ENCOUNTER — Other Ambulatory Visit (HOSPITAL_BASED_OUTPATIENT_CLINIC_OR_DEPARTMENT_OTHER): Payer: Self-pay

## 2021-08-16 NOTE — Telephone Encounter (Signed)
Pt insurance is active and benefits verified through [employer] Co-pay 0, DED 0/0 met, out of pocket $1,700/$1,548.36 met, co-insurance 0%. no pre-authorization required. Sal/[employer] 08/16/2021_0 :56pm, REF# [id number]   How many CR sessions are covered? (36 sessions for TCR, 72 sessions for ICR)36 Is this a lifetime maximum or an annual maximum? annual Has the member used any of these services to date? no Is there a time limit (weeks/months) on start of program and/or program completion? no

## 2021-08-17 ENCOUNTER — Encounter (HOSPITAL_BASED_OUTPATIENT_CLINIC_OR_DEPARTMENT_OTHER): Payer: Self-pay | Admitting: Pharmacist

## 2021-08-17 ENCOUNTER — Ambulatory Visit (INDEPENDENT_AMBULATORY_CARE_PROVIDER_SITE_OTHER): Payer: 59 | Admitting: Family Medicine

## 2021-08-17 ENCOUNTER — Ambulatory Visit: Payer: 59 | Admitting: Registered"

## 2021-08-17 ENCOUNTER — Other Ambulatory Visit (HOSPITAL_BASED_OUTPATIENT_CLINIC_OR_DEPARTMENT_OTHER): Payer: Self-pay

## 2021-08-17 ENCOUNTER — Encounter (HOSPITAL_BASED_OUTPATIENT_CLINIC_OR_DEPARTMENT_OTHER): Payer: Self-pay | Admitting: Family Medicine

## 2021-08-17 DIAGNOSIS — R112 Nausea with vomiting, unspecified: Secondary | ICD-10-CM | POA: Diagnosis not present

## 2021-08-17 MED ORDER — ONDANSETRON 8 MG PO TBDP
8.0000 mg | ORAL_TABLET | Freq: Three times a day (TID) | ORAL | 0 refills | Status: DC | PRN
  Filled 2021-08-17: qty 20, 7d supply, fill #0

## 2021-08-17 MED ORDER — ADULT MULTIVITAMIN W/MINERALS CH
1.0000 | ORAL_TABLET | Freq: Every day | ORAL | 0 refills | Status: DC
  Filled 2021-08-17: qty 30, 30d supply, fill #0
  Filled 2021-08-29: qty 100, 100d supply, fill #0

## 2021-08-17 MED ORDER — LITHIUM CARBONATE 300 MG PO CAPS
300.0000 mg | ORAL_CAPSULE | Freq: Two times a day (BID) | ORAL | 0 refills | Status: DC
Start: 2021-08-17 — End: 2021-09-22
  Filled 2021-08-17: qty 40, 20d supply, fill #0
  Filled 2021-08-18: qty 20, 10d supply, fill #0

## 2021-08-17 MED ORDER — VITAMIN D (ERGOCALCIFEROL) 1.25 MG (50000 UNIT) PO CAPS
50000.0000 [IU] | ORAL_CAPSULE | ORAL | 0 refills | Status: DC
  Filled 2021-08-17: qty 12, 84d supply, fill #0
  Filled 2021-11-09: qty 12, 84d supply, fill #1
  Filled 2022-02-05: qty 6, 42d supply, fill #2

## 2021-08-17 MED ORDER — BUPROPION HCL ER (SR) 150 MG PO TB12
150.0000 mg | ORAL_TABLET | Freq: Two times a day (BID) | ORAL | 1 refills | Status: DC
  Filled 2021-08-17: qty 60, 30d supply, fill #0
  Filled 2021-09-12: qty 60, 30d supply, fill #1

## 2021-08-17 NOTE — Progress Notes (Unsigned)
   Virtual Visit via Telephone   I connected with  Jordan Sparks  on 08/17/21 by telephone/telehealth and verified that I am speaking with the correct person using two identifiers.   I discussed the limitations, risks, security and privacy concerns of performing an evaluation and management service by telephone, including the higher likelihood of inaccurate diagnosis and treatment, and the availability of in person appointments.  We also discussed the likely need of an additional face to face encounter for complete and high quality delivery of care.  I also discussed with the patient that there may be a patient responsible charge related to this service. The patient expressed understanding and wishes to proceed.  Provider location is in medical facility. Patient location is at their home, different from provider location. People involved in care of the patient during this telehealth encounter were myself, my nurse/medical assistant, and my front office/scheduling team member.  Review of Systems: No fevers, night sweats, weight loss, chest pain, or shortness of breath.  Objective Findings:    General: Speaking full sentences, no audible heavy breathing.  Sounds alert and appropriately interactive.  Independent interpretation of tests performed by another provider:   None.  Brief History, Exam, Impression, and Recommendations:    No problem-specific Assessment & Plan notes found for this encounter.  135/82; 102  A few days ago, patient began to notice  I discussed the above assessment and treatment plan with the patient. The patient was provided an opportunity to ask questions and all were answered. The patient agreed with the plan and demonstrated an understanding of the instructions.  The patient was advised to call back or seek an in-person evaluation if the symptoms worsen or if the condition fails to improve as anticipated.  I provided 15 minutes of face to face and  non-face-to-face time during this encounter date, time was needed to gather information, review chart, records, communicate/coordinate with staff remotely, as well as complete documentation.   ___________________________________________ Kyliah Deanda de [place], MD, ABFM, CAQSM Primary Care and Sports Medicine [facility]

## 2021-08-18 ENCOUNTER — Other Ambulatory Visit (HOSPITAL_BASED_OUTPATIENT_CLINIC_OR_DEPARTMENT_OTHER): Payer: Self-pay

## 2021-08-18 ENCOUNTER — Telehealth (HOSPITAL_COMMUNITY): Payer: Self-pay

## 2021-08-18 NOTE — Telephone Encounter (Signed)
Called patient to see if he was interested in participating in the Cardiac Rehab Program. Patient stated yes. Patient will come in for orientation on 08/30/2021@9 :30am and will attend the 10:15am exercise class.   Pensions consultant.

## 2021-08-24 ENCOUNTER — Encounter: Payer: 59 | Admitting: Registered"

## 2021-08-29 ENCOUNTER — Encounter (HOSPITAL_BASED_OUTPATIENT_CLINIC_OR_DEPARTMENT_OTHER): Payer: Self-pay | Admitting: Family Medicine

## 2021-08-29 ENCOUNTER — Ambulatory Visit (INDEPENDENT_AMBULATORY_CARE_PROVIDER_SITE_OTHER): Payer: 59 | Admitting: Family Medicine

## 2021-08-29 ENCOUNTER — Other Ambulatory Visit (HOSPITAL_BASED_OUTPATIENT_CLINIC_OR_DEPARTMENT_OTHER): Payer: Self-pay | Admitting: Family Medicine

## 2021-08-29 ENCOUNTER — Other Ambulatory Visit (HOSPITAL_BASED_OUTPATIENT_CLINIC_OR_DEPARTMENT_OTHER): Payer: Self-pay

## 2021-08-29 ENCOUNTER — Telehealth (HOSPITAL_COMMUNITY): Payer: Self-pay | Admitting: *Deleted

## 2021-08-29 VITALS — BP 166/119 | HR 97 | Ht 70.0 in | Wt 309.0 lb

## 2021-08-29 DIAGNOSIS — I428 Other cardiomyopathies: Secondary | ICD-10-CM

## 2021-08-29 DIAGNOSIS — E119 Type 2 diabetes mellitus without complications: Secondary | ICD-10-CM

## 2021-08-29 DIAGNOSIS — I10 Essential (primary) hypertension: Secondary | ICD-10-CM

## 2021-08-29 DIAGNOSIS — R112 Nausea with vomiting, unspecified: Secondary | ICD-10-CM

## 2021-08-29 DIAGNOSIS — F419 Anxiety disorder, unspecified: Secondary | ICD-10-CM | POA: Diagnosis not present

## 2021-08-29 LAB — HEMOGLOBIN A1C
Est. average glucose Bld gHb Est-mCnc: 154 mg/dL
Hgb A1c MFr Bld: 7 % — ABNORMAL HIGH (ref 4.8–5.6)

## 2021-08-29 MED ORDER — DAPAGLIFLOZIN PROPANEDIOL 10 MG PO TABS
10.0000 mg | ORAL_TABLET | Freq: Every day | ORAL | 6 refills | Status: DC
  Filled 2021-08-29 (×2): qty 30, 30d supply, fill #0
  Filled 2021-08-30 – 2021-09-02 (×2): qty 90, 90d supply, fill #0
  Filled 2021-12-26: qty 30, 30d supply, fill #1
  Filled 2022-01-25: qty 30, 30d supply, fill #2
  Filled 2022-02-22: qty 30, 30d supply, fill #3
  Filled 2022-03-25: qty 30, 30d supply, fill #4

## 2021-08-29 MED ORDER — GABAPENTIN 300 MG PO CAPS
300.0000 mg | ORAL_CAPSULE | Freq: Two times a day (BID) | ORAL | 0 refills | Status: DC
Start: 2021-08-29 — End: 2021-09-23
  Filled 2021-08-29: qty 60, 30d supply, fill #0

## 2021-08-29 MED ORDER — FLUTICASONE PROPIONATE 50 MCG/ACT NA SUSP
2.0000 | Freq: Every day | NASAL | 1 refills | Status: DC | PRN
  Filled 2021-08-29: qty 16, 30d supply, fill #0
  Filled 2021-09-22: qty 16, 30d supply, fill #1

## 2021-08-29 MED ORDER — ONDANSETRON 8 MG PO TBDP
8.0000 mg | ORAL_TABLET | Freq: Three times a day (TID) | ORAL | 0 refills | Status: DC | PRN
  Filled 2021-08-29: qty 20, 7d supply, fill #0

## 2021-08-29 NOTE — Progress Notes (Signed)
    Procedures performed today:    None.  Independent interpretation of notes and tests performed by another provider:   None.  Brief History, Exam, Impression, and Recommendations:    BP (!) 166/119   Pulse 97   Ht 5\' 10"  (1.778 m)   Wt (!) 309 lb (140.2 kg)   SpO2 92%   BMI 44.34 kg/m   Hypertension Blood pressure again elevated in office today.  He does indicate that he did not take his and spironolactone this morning yet.  Still does occasionally have intermittent chest pains.  Has been checking blood pressure at home and reports that generally these readings are also elevated.  Denies any recent headaches.  He does have follow-up with cardiology, this appointment is scheduled for next week. Discussed with patient lifestyle modifications that he should also be incorporating into his regimen He likely will need some adjustment to his medications, will defer to cardiology He is also set up to start with cardiac rehab program  Diabetes mellitus [facility]) Due for recheck of hemoglobin A1c, will complete today He continues with Farxiga, glipizide, metformin.  Denies any issues with his medications Requesting refill of Farxiga, refill sent to pharmacy on file.  No issues with polyuria or dysuria Most recent hemoglobin A1c was slightly above goal at 7.7%, would expect some improvement given that 03-17-2000 is a new medication since last check Any further recommended medication changes pending results of A1c  Anxiety Patient continues with current medication regimen of Wellbutrin, BuSpar, lithium, Prozac.  He was referred to psychiatry previously, it appears that this appointment has been scheduled, patient seems unsure about the plan.  Did discuss further with referral specialist today who will talk further with patient as well as providing contact information for specialist office for patient to reach out regarding upcoming appointment Patient does feel as though he has had some  increased anxiety recently.  Given current medication regimen, would appreciate input from psychiatry regarding any medication changes  Return in about 6 weeks (around 10/10/2021) for DM, HTN.   ___________________________________________ Tiamarie Furnari de 10/12/2021, MD, ABFM, [facility] Primary Care and Sports Medicine [facility]

## 2021-08-29 NOTE — Assessment & Plan Note (Signed)
Patient continues with current medication regimen of Wellbutrin, BuSpar, lithium, Prozac.  He was referred to psychiatry previously, it appears that this appointment has been scheduled, patient seems unsure about the plan.  Did discuss further with referral specialist today who will talk further with patient as well as providing contact information for specialist office for patient to reach out regarding upcoming appointment Patient does feel as though he has had some increased anxiety recently.  Given current medication regimen, would appreciate input from psychiatry regarding any medication changes

## 2021-08-29 NOTE — Patient Instructions (Signed)
  Medication Instructions:  Your physician recommends that you continue on your current medications as directed. Please refer to the Current Medication list given to you today. --If you need a refill on any your medications before your next appointment, please call your pharmacy first. If no refills are authorized on file call the office.-- Lab Work: Your physician has recommended that you have lab work today: Yes If you have labs (blood work) drawn today and your tests are completely normal, you will receive your results via MyChart message OR a phone call from our staff.  Please ensure you check your voicemail in the event that you authorized detailed messages to be left on a delegated number. If you have any lab test that is abnormal or we need to change your treatment, we will call you to review the results.  Referrals/Procedures/Imaging: No  Follow-Up: Your next appointment:   Your physician recommends that you schedule a follow-up appointment in: 6 weeks with Dr. de Cuba.  You will receive a text message or e-mail with a link to a survey about your care and experience with us today! We would greatly appreciate your feedback!   Thanks for letting us be apart of your health journey!!  Primary Care and Sports Medicine   Dr. Raymond de Cuba   We encourage you to activate your patient portal called "MyChart".  Sign up information is provided on this After Visit Summary.  MyChart is used to connect with patients for Virtual Visits (Telemedicine).  Patients are able to view lab/test results, encounter notes, upcoming appointments, etc.  Non-urgent messages can be sent to your provider as well. To learn more about what you can do with MyChart, please visit --  [email or web address].    

## 2021-08-29 NOTE — Assessment & Plan Note (Signed)
Due for recheck of hemoglobin A1c, will complete today He continues with Farxiga, glipizide, metformin.  Denies any issues with his medications Requesting refill of Farxiga, refill sent to pharmacy on file.  No issues with polyuria or dysuria Most recent hemoglobin A1c was slightly above goal at 7.7%, would expect some improvement given that Marcelline Deist is a new medication since last check Any further recommended medication changes pending results of A1c

## 2021-08-29 NOTE — Assessment & Plan Note (Signed)
Blood pressure again elevated in office today.  He does indicate that he did not take his [place] and spironolactone this morning yet.  Still does occasionally have intermittent chest pains.  Has been checking blood pressure at home and reports that generally these readings are also elevated.  Denies any recent headaches.  He does have follow-up with cardiology, this appointment is scheduled for next week. Discussed with patient lifestyle modifications that he should also be incorporating into his regimen He likely will need some adjustment to his medications, will defer to cardiology He is also set up to start with cardiac rehab program

## 2021-08-29 NOTE — Telephone Encounter (Signed)
Unable to leave message called regarding appointment for cardiac rehab tomorrow.Thayer Headings RN BSN

## 2021-08-30 ENCOUNTER — Other Ambulatory Visit (HOSPITAL_BASED_OUTPATIENT_CLINIC_OR_DEPARTMENT_OTHER): Payer: Self-pay

## 2021-08-30 ENCOUNTER — Telehealth: Payer: Self-pay | Admitting: Student

## 2021-08-30 ENCOUNTER — Other Ambulatory Visit (HOSPITAL_BASED_OUTPATIENT_CLINIC_OR_DEPARTMENT_OTHER): Payer: Self-pay | Admitting: Family Medicine

## 2021-08-30 ENCOUNTER — Other Ambulatory Visit (HOSPITAL_COMMUNITY): Payer: Self-pay | Admitting: Family Medicine

## 2021-08-30 ENCOUNTER — Encounter (HOSPITAL_COMMUNITY)
Admission: RE | Admit: 2021-08-30 | Discharge: 2021-08-30 | Disposition: A | Discharge status: Home / Self Care | Payer: 59 | Source: Ambulatory Visit | Attending: Internal Medicine | Admitting: Internal Medicine

## 2021-08-30 DIAGNOSIS — I5022 Chronic systolic (congestive) heart failure: Secondary | ICD-10-CM | POA: Insufficient documentation

## 2021-08-30 DIAGNOSIS — I10 Essential (primary) hypertension: Secondary | ICD-10-CM

## 2021-08-30 LAB — GLUCOSE, CAPILLARY: Glucose-Capillary: 254 mg/dL — ABNORMAL HIGH (ref 70–99)

## 2021-08-30 MED ORDER — CARVEDILOL 12.5 MG PO TABS
12.5000 mg | ORAL_TABLET | Freq: Two times a day (BID) | ORAL | 1 refills | Status: DC
  Filled 2021-08-30: qty 60, 30d supply, fill #0

## 2021-08-30 MED ORDER — SPIRONOLACTONE 25 MG PO TABS
25.0000 mg | ORAL_TABLET | Freq: Every day | ORAL | 1 refills | Status: DC
Start: 2021-08-30 — End: 2021-09-09
  Filled 2021-08-30 (×2): qty 30, 30d supply, fill #0

## 2021-08-30 MED ORDER — AMLODIPINE BESYLATE 5 MG PO TABS
5.0000 mg | ORAL_TABLET | Freq: Every day | ORAL | 1 refills | Status: DC
  Filled 2021-08-30: qty 30, 30d supply, fill #0
  Filled 2021-09-23: qty 30, 30d supply, fill #1

## 2021-08-30 NOTE — Telephone Encounter (Addendum)
   Received page from Cardiac Rehab RN, Byrd Hesselbach, about patient who presented for initial Cardiac Rehab session. BP in the 160/110s. BP was 166/119 at PCP's office yesterday. Reviewed home medications with patient. It sounds like he has only been taking his Lasix and Entresto. He has not been taking his Amlodipine, Coreg, Spironolactone, or [place]. He states he thinks he was suppose to stop his Amlodipine or his Coreg (he cannot remember which one) after another med was added. However, per last office visit on 07/05/2021, Amlodipine was increased to 7.5mg  daily and patient was advised to continue Coreg 25mg  twice daily. However, he has not been taking either. Patient also states he has not been taking Spironolactone because he ran out and this cannot be refilled yet. However, I called and spoke with his Pharmacy and he got a 90 day prescription in 06/2021 so he should still have another months worth. Advised patient to restart Spironolactone (I talked with Pharmacy and they agreed to fill a 30 day prescription) and Coreg. Will restart Coreg at 12.5mg  twice daily since he has not been taking anything. Also recommended restarting Amlodipine 5mg  daily. I don't want to add back too many medications too quickly. I arranged for him to come in and see me later this week in the office and additional medication changes can be made at that time if needed.  Of note, patient did report intermittent chest pain that occurs randomly but mostly with emotional stress. He is currently chest pain free while I am on the phone with him. Coronary CTA in 05/2021 showed coronary calcium score of 24 with only minimal CAD. Suspect chest pain is likely due to uncontrolled BP and underlying CHF. Discussed importance of taking all medications.   , PA-C 08/30/2021 12:28 PM

## 2021-08-30 NOTE — Progress Notes (Signed)
Incomplete Session Note  Patient Details  Name: Wing Schoch MRN: [id number] Date of Birth: [birth year] Referring Provider:    Lucia Bitter did not complete his rehab session.  Farris's blood pressure is elevated today blood pressure 162/104. Oxygen saturation 99% on room air. Ezana reports he is having chest discomfort he rates a 3/10 it has been intermittent. Today Atlee says that he is anxious and wanted to start cardiac rehab. Will not proceed with cardiac rehab today as Mr McGregor-Shabazz's blood pressures are elevated today. Reviewed Mr McGregor's medications today he is not taking his coreg, spironolactone or farxiga. I did call the [facility] pharmacy to find out about refills. The pharmacy said that [facility] never picked up his Coreg after his hospitalization in March. Marjie Skiff PAC called and notified. Callie spoke with Saabir over the phone and made an appointment to follow up on Thursday at 1:55 pm. CBG 254. Heart rate 98. Patient placed on telemetry Sinus Rhythm noted. I asked Mr Gascoigne to bring his medications bottle to his appointment to see Marjie Skiff [facility] on Thursday. Patient states understanding. Will cancel current appointments for cardiac rehab. Mr Macqueen had no complaints upon exit from phase 2 cardiac rehab. Will fax today's ECG tracings to [facility] heart care for [facility] for review.Thayer Headings RN BSN

## 2021-08-30 NOTE — Progress Notes (Signed)
Cardiology Office Note:    Date:  09/01/2021   ID:  Gayleen Orem, DOB 09/20/1975, MRN [id number]  PCP:  de [place], Raymond J, MD  Cardiologist:  Elouise Munroe, MD  Electrophysiologist:  None   Referring MD: de [place], Raymond J, MD   Chief Complaint: follow-up of hypertension  History of Present Illness:    Rollin Bransfield is a 46 y.o. male with a history of mild non-obstructive CAD noted on coronary CTA in 05/2021, non-ischemic cardiomyopathy/ chronic systolic CHF with EF of 81-15% on Echo in 05/2021, uncontrolled hypertension, hyperlipidemia, type 2 diabetes mellitus, obstructive sleep apnea on CPAP, congenital spinal stenosis, anxiety/depression, mood disorder, obesity, and polysubstance abuse (including prior cocaine use) who is followed by Dr. Margaretann Loveless and presents today for further evaluation of hypertension.   Patient was admitted in 05/2021 after presenting with chest pain and shortness of breath. High-sensitivity troponin was minimally elevated and flat in the 20s. Echo showed LVEF of 25-30% with global hypokinesis and moderate LVH as well as normal RV and mild MR. Coronary CTA showed a coronary calcium score of 24 with only minimal soft plaque in the mid LAD. Cardiomyopathy felt to be due to longstanding uncontrolled BP. Renal artery ultrasound showed 1-59% stenosis of bilateral renal arteries. He was started on GDMT. Patient was last seen by Coletta Memos, NP, in 06/2021 at which time BP had been running high at home in the 150-160s/100s but he was otherwise doing well from a cardiac standpoint. His Amlodipine was increased to 7.13m daily.  I received a call from Cardiac Rehab about the patient on 08/30/2021 while working in the hospital. There was concern about patient participating in cardiac rehab due to patient's BP in the 160s/100s. He also reported intermittent chest pain which was felt to likely be due to uncontrolled hypertension given reassuring coronary CTA in  05/2021. After talking on the phone with patient, it was discovered that patient was only taking his Entresto and Lasix but had not been taking his Amlodipine, Coreg, Spironolactone, or FIran I advised patient to restart Spironolactone 213mdaily and restarted his Coreg at a lower dose of 12.76m89mwice daily and Amlodipine at a lower dose of 76mg33mily. This visit was arranged for close follow-up.   Here with wife JessJanett Billowatient restarted Coreg, Spironolactone, and Amlodipine as recommended above.  BP still very elevated.  He reports intermittent chest pain with emotional stress that last for a couple of minutes at time and then resolves on its own. He denies any exertional chest pain.  He has some associated shortness of breath with these episodes of chest pain.  He also describes some bendopnea if he is bent over for long periods of time.  Otherwise, no significant shortness of breath.  No orthopnea or PND. He occasionally has some lower extremity edema but currently well controlled. No palpitations, lightheadedness, dizziness, syncope.   Past Medical History:  Diagnosis Date   Anxiety    Depression    Diabetes mellitus type 2 in obese (HCC)    Hypertension    Manic disorder (HCC)[place] Obesity     Past Surgical History:  Procedure Laterality Date   Dental procedure      Current Medications: Current Meds  Medication Sig   amLODipine (NORVASC) 5 MG tablet Take 1 tablet (5 mg total) by mouth daily.   aspirin 81 MG chewable tablet Chew 1 tablet (81 mg total) by mouth daily.   blood glucose meter kit and supplies  KIT Dispense based on patient and insurance preference. Use up to four times daily as directed. (FOR ICD-9 250.00, 250.01).   buPROPion (WELLBUTRIN SR) 150 MG 12 hr tablet Take 1 tablet (150 mg total) by mouth 2 (two) times daily.   busPIRone (BUSPAR) 15 MG tablet Take 1 tablet (15 mg total) by mouth 3 (three) times daily as needed (anxiety).   carvedilol (COREG) 25 MG tablet Take 1  tablet (25 mg total) by mouth 2 (two) times daily.   cetirizine (ZYRTEC) 10 MG tablet Take 1 tablet (10 mg total) by mouth daily.   dapagliflozin propanediol (FARXIGA) 10 MG TABS tablet Take 1 tablet (10 mg total) by mouth daily.   FLUoxetine (PROZAC) 20 MG capsule Take 1 capsule (20 mg total) by mouth daily.   fluticasone (FLONASE) 50 MCG/ACT nasal spray Place 2 sprays into both nostrils daily as needed for allergies.   furosemide (LASIX) 20 MG tablet Take 1 tablet (20 mg total) by mouth 2 (two) times daily.   gabapentin (NEURONTIN) 300 MG capsule Take 1 capsule (300 mg total) by mouth 2 (two) times daily.   glipiZIDE (GLUCOTROL) 5 MG tablet Take 1 tablet (5 mg total) by mouth 2 (two) times daily.   guaiFENesin (MUCINEX) 600 MG 12 hr tablet Take 1 tablet (600 mg total) by mouth 2 (two) times daily.   hydrALAZINE (APRESOLINE) 25 MG tablet Take 1 tablet (25 mg total) by mouth 3 (three) times daily.   isosorbide mononitrate (IMDUR) 30 MG 24 hr tablet Take 1 tablet (30 mg total) by mouth daily.   lithium carbonate 300 MG capsule Take 1 capsule (300 mg total) by mouth 2 (two) times daily.   metFORMIN (GLUCOPHAGE) 1000 MG tablet Take 1 tablet (1,000 mg total) by mouth 2 (two) times daily.   Multiple Vitamin (MULTIVITAMIN WITH MINERALS) TABS tablet Take 1 tablet by mouth daily.   NON FORMULARY Pt uses a cpap nightly   ondansetron (ZOFRAN-ODT) 8 MG disintegrating tablet Dissolve 1 tablet under the tongue every 8 (eight) hours as needed for nausea.   potassium chloride SA (KLOR-CON M) 20 MEQ tablet Take 2 tablets (40 mEq total) by mouth daily. (Patient taking differently: Take 40 mEq by mouth 2 (two) times daily.)   rosuvastatin (CRESTOR) 5 MG tablet Take 1 tablet (5 mg total) by mouth daily.   sacubitril-valsartan (ENTRESTO) 97-103 MG Take 1 tablet by mouth 2 (two) times daily.   spironolactone (ALDACTONE) 25 MG tablet Take 1 tablet (25 mg total) by mouth daily.   Vitamin D, Ergocalciferol, (DRISDOL)  1.25 MG (50000 UNIT) CAPS capsule Take 1 capsule (50,000 Units total) by mouth once a week.   [DISCONTINUED] acetaminophen (TYLENOL) 500 MG tablet Take 1,000 mg by mouth every 6 (six) hours as needed for moderate pain.   [DISCONTINUED] carvedilol (COREG) 12.5 MG tablet Take 1 tablet (12.5 mg total) by mouth 2 (two) times daily with a meal.     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, Administrator  Tobacco Use   Smoking status: Former    Packs/day: 0.25    Years: 32.00    Total pack years: 8.00    Types: Cigarettes  Quit date: 04/2021    Years since quitting: 0.3   Smokeless tobacco: Never  Vaping Use   Vaping Use: Never used  Substance and Sexual Activity   Alcohol use: Yes    Comment: occasional   Drug use: Yes    Types: Marijuana    Comment: routinely   Sexual activity: Not on file  Other Topics Concern   Not on file  Social History Narrative   Not on file   Social Determinants of Health   Financial Resource Strain: Low Risk  (06/02/2021)   Overall Financial Resource Strain (CARDIA)    Difficulty of Paying Living Expenses: Not hard at all  Food Insecurity: No Food Insecurity (06/02/2021)   Hunger Vital Sign    Worried About Running Out of Food in the Last Year: Never true    Ran Out of Food in the Last Year: Never true  Transportation Needs: No Transportation Needs (06/02/2021)   PRAPARE - Hydrologist (Medical): No    Lack of Transportation (Non-Medical): No  Physical Activity: Not on file  Stress: Not on file  Social Connections: Not on file     Family History: The patient's family history includes Seizures in his brother. There is no history of Heart  failure.  ROS:   Please see the history of present illness.     EKGs/Labs/Other Studies Reviewed:    The following studies were reviewed:  Echocardiogram 05/31/2021: Impressions:  1. Left ventricular ejection fraction, by estimation, is 25 to 30%. The  left ventricle has severely decreased function. The left ventricle  demonstrates global hypokinesis. There is moderate concentric left  ventricular hypertrophy. Indeterminate  diastolic filling due to E-A fusion.   2. Right ventricular systolic function is normal. The right ventricular  size is normal. Tricuspid regurgitation signal is inadequate for assessing  PA pressure.   3. The mitral valve is grossly normal. Mild mitral valve regurgitation.  No evidence of mitral stenosis.   4. The aortic valve is tricuspid. Aortic valve regurgitation is not  visualized. No aortic stenosis is present.   5. The inferior vena cava is dilated in size with <50% respiratory  variability, suggesting right atrial pressure of 15 mmHg.  _______________  Renal Artery Ultrasound 06/01/2021: Summary:  Renal:  - Right: 1-59% stenosis of the right renal artery. Normal right resistive Index. Normal size right kidney.  - Left:  1-59% stenosis of the left renal artery. Normal left resistive Index. Normal size of left kidney.   Mesenteric:  Areas of limited visceral study include mesenteric arteries and left renal  artery.  _______________  Coronary CTA 06/01/2021: Impressions: 1. Coronary calcium score of 24. Elevated based on age, race, and gender. 2. Normal coronary origin with left dominance. 3. CAD-RADS 1. Minimal non-obstructive CAD (1-24%). Consider non-atherosclerotic causes of chest pain. Consider preventive therapy and risk factor modification.  EKG:  EKG ordered today. EKG personally reviewed and demonstrates normal sinus rhythm, rate 100 bpm, with no acute ST/T changes. Normal axis. Normal PR and QRS intervals. QTc 472 ms.  Recent  Labs: 05/04/2021: ALT 46; TSH 1.130 06/02/2021: Magnesium 2.1 06/03/2021: Hemoglobin 15.1; Platelets 251 06/14/2021: B Natriuretic Peptide 145.5 07/25/2021: BUN 11; Creatinine, Ser 1.14; Potassium 4.4; Sodium 140  Recent Lipid Panel    Component Value Date/Time   CHOL 189 05/04/2021 0953   TRIG 106 05/04/2021 0953   HDL 54 05/04/2021 0953   CHOLHDL 3.5 05/04/2021 0953   CHOLHDL 5.2 02/09/2018 0221  VLDL 37 02/09/2018 0221   LDLCALC 116 (H) 05/04/2021 0953    Physical Exam:    Vital Signs: BP (!) 162/108 (BP Location: Left Arm, Patient Position: Sitting)   Pulse 100   Ht _0  (1.778 m)   Wt (!) 308 lb 3.2 oz (139.8 kg)   SpO2 98%   BMI 44.22 kg/m     Wt Readings from Last 3 Encounters:  09/01/21 (!) 308 lb 3.2 oz (139.8 kg)  08/31/21 (!) 309 lb (140.2 kg)  08/29/21 (!) 309 lb (140.2 kg)     General: 46 y.o. morbidly obese African-American male in no acute distress. HEENT: Normocephalic and atraumatic. Sclera clear.  Neck: Supple. No carotid bruits. Difficult to assess JVD due to body habitus. Heart: Borderline tachycardia with normal rate. Distinct S1 and S2. No murmurs, gallops, or rubs. Radial pulses 2+ and equal bilaterally. Lungs: No increased work of breathing. Clear to ausculation bilaterally. No wheezes, rhonchi, or rales.  Abdomen: Soft, obese, and non-tender to palpation.  Extremities: No lower extremity edema.    Skin: Warm and dry. Neuro: Alert and oriented x3. No focal deficits. Psych: Normal affect. Responds appropriately.  Assessment:    1. Uncontrolled hypertension   2. Non-ischemic cardiomyopathy ([place])   3. Chronic systolic CHF (congestive heart failure) ([place])   4. Atypical chest pain   5. Non-obstructive CAD   6. Hyperlipidemia, unspecified hyperlipidemia type   7. Type 2 diabetes mellitus with complication, without long-term current use of insulin ([place])   8. Obstructive sleep apnea   9. Polysubstance abuse ([place])     Plan:    Uncontrolled  Hypertension BP has been running in the 160s/100s; however, patient had not been taking all of his medications. See HPI for more details. - Current medications: Entresto 97-117m twice daily, Spironlactone 2564mdaily (recently restarted on 6/20), Coreg 12.64m63mwice daily (recently recstarted on 6/20), and Amlodipine 64mg61mily (recently restarted on 6/20). - Will increase Coreg back to 264mg61mce daily. Will start Hydralazine 264mg 9me times daily and Imdur 30mg d76m given reduced EF. Continue current dose of Entresto, Spironolactone, and Amlodipine. - Recommend repeat BMET in 1-2 weeks after restarting Spironolactone. He [facility] in [facility] so this can be checked at that time. - Also discussed how increasing physical activity and limiting sodium intake can help with BP.  Of note, patient was not allowed to participate in Cardiac Rehab earlier this week due to initial BP of 162/104. I would be OK with patient participating in Cardiac Rehab as long as his BP is <180/115 given longstanding uncontrolled BP. If BP is above this, recommend Cardiac Rehab call us befo[place] exercising (still likely OK to participate but we may just want to make sure he is not having any concerning symptoms).  Non-Ischemic Cardiomyopathy Chronic Systolic CHF Echo in 05/2021 11/1692 LVEF of 25-30% with global hypokinesis and moderate LVH as well as normal RV and mild MR. Coronary CTA at that time showed only minimal CAD. - Does not appear grossly volume overloaded on exam. - Continue Lasix 20mg tw364mdaily.  - Continue Entresto 97-103mg twi46maily. - Continue Spironolactone 264mg dail26m- Will increase Coreg to 264mg twice74mly.  - Will start Hydralazine 264mg three 4ms daily and Imdur 30mg daily. 9mll restart Farxiga 10mg daily. H8ming to get this prescription tomorrow. - Discussed importance of daily weights and sodium/fluid restrictions.  - Recommend repeat BMET in 1-2  weeks after restarting  Spironolactone. He already has an appointment in the [facility] on 09/09/2021 so this can be checked at that time.  Atypical Chest Pain Non-Obstructive CAD Coronary CTA in 05/2021 showed a coronary calcium score of 24 with only minimal soft plaque in the mid LAD. Patient reports atypical chest pain with emotional stress but no exertional chest pain. - EKG today shows no acute ischemic changes. - Continue aspirin and statin. - Symptoms very atypical. Patient admits that his anxiety has been bad and I suspect this is a major cause of his symptoms. Recent coronary CTA is very reassuring. No additional work-up necessary at this time. Recommended patient follow-up with PCP in regards to his anxiety.  Hyperlipidemia Most recent lipid panel in 04/2021: Total Cholesterol 189, Triglycerides 106, HDL 54, LDL 116. LDL goal <70 given CAD. - Currently on Crestor 43m daily.  - LDL not at goal. Did not have time to discuss today given long discussion regarding hypertension. Can discuss at follow-up. Will likely recommend increasing Crestor to at least 268mdaily.  Type 2 Diabetes Mellitus Hemoglobin A1c 7.0% on 08/29/2021. - Management per PCP.  Obstructive Sleep Apnea - Continue CPAP.  Polysubstance Abuse Patient continues to smoke marijuana but denies any current tobacco, alcohol, or cocaine use. - Recommended cessation of marijuana use as well.  Disposition: Keep follow-up with Dr. BeHaroldine Lawsn 09/09/2021 and then follow-up with me in 3 months.   Medication Adjustments/Labs and Tests Ordered: Current medicines are reviewed at length with the patient today.  Concerns regarding medicines are outlined above.  Orders Placed This Encounter  Procedures   EKG 12-Lead   Meds ordered this encounter  Medications   isosorbide mononitrate (IMDUR) 30 MG 24 hr tablet    Sig: Take 1 tablet (30 mg total) by mouth daily.    Dispense:  30 tablet    Refill:  3   carvedilol (COREG)  25 MG tablet    Sig: Take 1 tablet (25 mg total) by mouth 2 (two) times daily.    Dispense:  60 tablet    Refill:  3   hydrALAZINE (APRESOLINE) 25 MG tablet    Sig: Take 1 tablet (25 mg total) by mouth 3 (three) times daily.    Dispense:  90 tablet    Refill:  2    Patient Instructions  Medication Instructions:  Increase Carvedilol to 25 mg ( Take 1 Tablet Twice Daily). Start Imdur 30 mg ( Take 1 Tablet Daily). Start Hydralazine 25 mg ( Take 1 Tablet Three Times Daily). *If you need a refill on your cardiac medications before your next appointment, please call your pharmacy*   Lab Work: None If you have labs (blood work) drawn today and your tests are completely normal, you will receive your results only by: My[place]ari[place]if you have MyChart) OR A paper copy in the mail If you have any lab test that is abnormal or we need to change your treatment, we will call you to review the results.   Testing/Procedures: No Testing   Follow-Up: [facility] and your health needs are our priority.  As part of our continuing mission to provide you with exceptional heart care, we have created designated Provider Care Teams.  These Care Teams include your primary Cardiologist (physician) and Advanced Practice Providers (APPs -  Physician Assistants and Nurse Practitioners) who all work together to provide you with the care you need, when you need it.  We recommend signing up for the patient portal  called "MyChart".  Sign up information is provided on this After Visit Summary.  MyChart is used to connect with patients for Virtual Visits (Telemedicine).  Patients are able to view lab/test results, encounter notes, upcoming appointments, etc.  Non-urgent messages can be sent to your provider as well.   To learn more about what you can do with MyChart, go to [email or web address].    Your next appointment:   3 month(s)  The format for your next appointment:   In Person  Provider:    Sande Rives, PA-C    Then, Elouise Munroe, MD will plan to see you again in 9 month(s).    Other Instructions Heart Failure Education:  Weigh yourself EVERY morning after you go to the bathroom but before you eat or drink anything. Write this number down in a weight log/diary. If you gain 3 pounds overnight or 5 pounds in a week, call the office. Take your medicines as prescribed. If you have concerns about your medications, please call us before you stop taking them. Eat low salt foods--Limit salt (sodium) to 2000 mg per day. This will help prevent your body from holding onto fluid. Read food labels as many processed foods have a lot of sodium, especially canned goods and prepackaged meats. If you would like some assistance choosing low sodium foods, we would be happy to set you up with a nutritionist. Limit all fluids for the day to less than 2 liters (64 ounces). Fluid includes all drinks, coffee, juice, ice chips, soup, jello, and all other liquids. Stay as active as you can everyday. Staying active will give you more energy and make your muscles stronger. Start with 5 minutes at a time and work your way up to 30 minutes a day. Break up your activities--do some in the morning and some in the afternoon. Start with 3 days per week and work your way up to 5 days as you can.  If you have chest pain, feel short of breath, dizzy, or lightheaded, STOP. If you don't feel better after a short rest, call 911. If you do feel better, call the office to let us know you have symptoms with exercise.    Important Information About Sugar         Signed, Eppie Gibson  09/01/2021 6:01 PM    [facility]

## 2021-08-31 ENCOUNTER — Ambulatory Visit (INDEPENDENT_AMBULATORY_CARE_PROVIDER_SITE_OTHER): Payer: 59

## 2021-08-31 ENCOUNTER — Other Ambulatory Visit (HOSPITAL_BASED_OUTPATIENT_CLINIC_OR_DEPARTMENT_OTHER): Payer: Self-pay

## 2021-08-31 ENCOUNTER — Ambulatory Visit (INDEPENDENT_AMBULATORY_CARE_PROVIDER_SITE_OTHER): Payer: 59 | Admitting: Orthopaedic Surgery

## 2021-08-31 ENCOUNTER — Encounter: Payer: Self-pay | Admitting: Orthopaedic Surgery

## 2021-08-31 VITALS — Ht 70.0 in | Wt 309.0 lb

## 2021-08-31 DIAGNOSIS — G8929 Other chronic pain: Secondary | ICD-10-CM

## 2021-08-31 DIAGNOSIS — M5416 Radiculopathy, lumbar region: Secondary | ICD-10-CM

## 2021-08-31 DIAGNOSIS — M25512 Pain in left shoulder: Secondary | ICD-10-CM | POA: Diagnosis not present

## 2021-08-31 MED ORDER — METHYLPREDNISOLONE ACETATE 40 MG/ML IJ SUSP
80.0000 mg | INTRAMUSCULAR | Status: AC | PRN
  Administered 2021-08-31: 80 mg via INTRA_ARTICULAR

## 2021-08-31 MED ORDER — BUPIVACAINE HCL 0.25 % IJ SOLN
2.0000 mL | INTRAMUSCULAR | Status: AC | PRN
  Administered 2021-08-31: 2 mL via INTRA_ARTICULAR

## 2021-08-31 MED ORDER — LIDOCAINE HCL 1 % IJ SOLN
2.0000 mL | INTRAMUSCULAR | Status: AC | PRN
  Administered 2021-08-31: 2 mL
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
## 2021-09-01 ENCOUNTER — Other Ambulatory Visit (HOSPITAL_BASED_OUTPATIENT_CLINIC_OR_DEPARTMENT_OTHER): Payer: Self-pay | Admitting: Family Medicine

## 2021-09-01 ENCOUNTER — Encounter: Payer: Self-pay | Admitting: Student

## 2021-09-01 ENCOUNTER — Ambulatory Visit (INDEPENDENT_AMBULATORY_CARE_PROVIDER_SITE_OTHER): Payer: 59 | Admitting: Student

## 2021-09-01 ENCOUNTER — Other Ambulatory Visit (HOSPITAL_BASED_OUTPATIENT_CLINIC_OR_DEPARTMENT_OTHER): Payer: Self-pay

## 2021-09-01 VITALS — BP 162/108 | HR 100 | Ht 70.0 in | Wt 308.2 lb

## 2021-09-01 DIAGNOSIS — I2510 Atherosclerotic heart disease of native coronary artery without angina pectoris: Secondary | ICD-10-CM | POA: Diagnosis not present

## 2021-09-01 DIAGNOSIS — I428 Other cardiomyopathies: Secondary | ICD-10-CM | POA: Diagnosis not present

## 2021-09-01 DIAGNOSIS — G4733 Obstructive sleep apnea (adult) (pediatric): Secondary | ICD-10-CM

## 2021-09-01 DIAGNOSIS — R0789 Other chest pain: Secondary | ICD-10-CM | POA: Diagnosis not present

## 2021-09-01 DIAGNOSIS — E785 Hyperlipidemia, unspecified: Secondary | ICD-10-CM

## 2021-09-01 DIAGNOSIS — I5022 Chronic systolic (congestive) heart failure: Secondary | ICD-10-CM

## 2021-09-01 DIAGNOSIS — I10 Essential (primary) hypertension: Secondary | ICD-10-CM

## 2021-09-01 DIAGNOSIS — E118 Type 2 diabetes mellitus with unspecified complications: Secondary | ICD-10-CM

## 2021-09-01 DIAGNOSIS — F1910 Other psychoactive substance abuse, uncomplicated: Secondary | ICD-10-CM

## 2021-09-01 MED ORDER — CARVEDILOL 25 MG PO TABS
25.0000 mg | ORAL_TABLET | Freq: Two times a day (BID) | ORAL | 3 refills | Status: DC
  Filled 2021-09-01: qty 60, 30d supply, fill #0
  Filled 2021-09-27: qty 60, 30d supply, fill #1
  Filled 2021-10-28: qty 60, 30d supply, fill #2
  Filled 2021-11-24: qty 60, 30d supply, fill #3

## 2021-09-01 MED ORDER — ISOSORBIDE MONONITRATE ER 30 MG PO TB24
30.0000 mg | ORAL_TABLET | Freq: Every day | ORAL | 3 refills | Status: DC
  Filled 2021-09-01: qty 30, 30d supply, fill #0

## 2021-09-01 MED ORDER — HYDRALAZINE HCL 25 MG PO TABS
25.0000 mg | ORAL_TABLET | Freq: Three times a day (TID) | ORAL | 2 refills | Status: DC
  Filled 2021-09-01: qty 90, 30d supply, fill #0

## 2021-09-01 MED ORDER — ACETAMINOPHEN 500 MG PO TABS
1000.0000 mg | ORAL_TABLET | Freq: Four times a day (QID) | ORAL | 1 refills | Status: DC | PRN
  Filled 2021-09-01: qty 30, 4d supply, fill #0
  Filled 2021-09-22: qty 30, 4d supply, fill #1

## 2021-09-01 NOTE — Patient Instructions (Signed)
Medication Instructions:  Increase Carvedilol to 25 mg ( Take 1 Tablet Twice Daily). Start Imdur 30 mg ( Take 1 Tablet Daily). Start Hydralazine 25 mg ( Take 1 Tablet Three Times Daily). *If you need a refill on your cardiac medications before your next appointment, please call your pharmacy*   Lab Work: None If you have labs (blood work) drawn today and your tests are completely normal, you will receive your results only by: MyChart Message (if you have MyChart) OR A paper copy in the mail If you have any lab test that is abnormal or we need to change your treatment, we will call you to review the results.   Testing/Procedures: No Testing   Follow-Up: At [facility], you and your health needs are our priority.  As part of our continuing mission to provide you with exceptional heart care, we have created designated Provider Care Teams.  These Care Teams include your primary Cardiologist (physician) and Advanced Practice Providers (APPs -  Physician Assistants and Nurse Practitioners) who all work together to provide you with the care you need, when you need it.  We recommend signing up for the patient portal called "MyChart".  Sign up information is provided on this After Visit Summary.  MyChart is used to connect with patients for Virtual Visits (Telemedicine).  Patients are able to view lab/test results, encounter notes, upcoming appointments, etc.  Non-urgent messages can be sent to your provider as well.   To learn more about what you can do with MyChart, go to [email or web address].    Your next appointment:   3 month(s)  The format for your next appointment:   In Person  Provider:   Marjie Skiff, PA-C    Then, Parke Poisson, MD will plan to see you again in 9 month(s).    Other Instructions Heart Failure Education:  Weigh yourself EVERY morning after you go to the bathroom but before you eat or drink anything. Write this number down in a weight log/diary. If  you gain 3 pounds overnight or 5 pounds in a week, call the office. Take your medicines as prescribed. If you have concerns about your medications, please call us before you stop taking them. Eat low salt foods--Limit salt (sodium) to 2000 mg per day. This will help prevent your body from holding onto fluid. Read food labels as many processed foods have a lot of sodium, especially canned goods and prepackaged meats. If you would like some assistance choosing low sodium foods, we would be happy to set you up with a nutritionist. Limit all fluids for the day to less than 2 liters (64 ounces). Fluid includes all drinks, coffee, juice, ice chips, soup, jello, and all other liquids. Stay as active as you can everyday. Staying active will give you more energy and make your muscles stronger. Start with 5 minutes at a time and work your way up to 30 minutes a day. Break up your activities--do some in the morning and some in the afternoon. Start with 3 days per week and work your way up to 5 days as you can.  If you have chest pain, feel short of breath, dizzy, or lightheaded, STOP. If you don't feel better after a short rest, call 911. If you do feel better, call the office to let us know you have symptoms with exercise.    Important Information About Sugar

## 2021-09-02 ENCOUNTER — Other Ambulatory Visit (HOSPITAL_BASED_OUTPATIENT_CLINIC_OR_DEPARTMENT_OTHER): Payer: Self-pay

## 2021-09-05 ENCOUNTER — Ambulatory Visit (HOSPITAL_COMMUNITY): Payer: 59

## 2021-09-07 ENCOUNTER — Ambulatory Visit (HOSPITAL_COMMUNITY): Payer: 59

## 2021-09-09 ENCOUNTER — Ambulatory Visit (HOSPITAL_COMMUNITY)
Admission: RE | Admit: 2021-09-09 | Discharge: 2021-09-09 | Disposition: A | Discharge status: Home / Self Care | Payer: 59 | Source: Ambulatory Visit | Attending: Internal Medicine | Admitting: Internal Medicine

## 2021-09-09 ENCOUNTER — Encounter (HOSPITAL_BASED_OUTPATIENT_CLINIC_OR_DEPARTMENT_OTHER): Payer: Self-pay | Admitting: Pharmacist

## 2021-09-09 ENCOUNTER — Encounter (HOSPITAL_COMMUNITY): Payer: Self-pay | Admitting: Internal Medicine

## 2021-09-09 ENCOUNTER — Other Ambulatory Visit (HOSPITAL_BASED_OUTPATIENT_CLINIC_OR_DEPARTMENT_OTHER): Payer: Self-pay | Admitting: Family Medicine

## 2021-09-09 ENCOUNTER — Ambulatory Visit (HOSPITAL_BASED_OUTPATIENT_CLINIC_OR_DEPARTMENT_OTHER)
Admission: RE | Admit: 2021-09-09 | Discharge: 2021-09-09 | Disposition: A | Discharge status: Home / Self Care | Payer: 59 | Source: Ambulatory Visit | Attending: Internal Medicine | Admitting: Internal Medicine

## 2021-09-09 ENCOUNTER — Other Ambulatory Visit (HOSPITAL_BASED_OUTPATIENT_CLINIC_OR_DEPARTMENT_OTHER): Payer: Self-pay

## 2021-09-09 VITALS — BP 160/80 | HR 89 | Wt 306.6 lb

## 2021-09-09 DIAGNOSIS — E118 Type 2 diabetes mellitus with unspecified complications: Secondary | ICD-10-CM | POA: Diagnosis not present

## 2021-09-09 DIAGNOSIS — I428 Other cardiomyopathies: Secondary | ICD-10-CM

## 2021-09-09 DIAGNOSIS — F1290 Cannabis use, unspecified, uncomplicated: Secondary | ICD-10-CM | POA: Diagnosis not present

## 2021-09-09 DIAGNOSIS — I2510 Atherosclerotic heart disease of native coronary artery without angina pectoris: Secondary | ICD-10-CM | POA: Insufficient documentation

## 2021-09-09 DIAGNOSIS — Z8249 Family history of ischemic heart disease and other diseases of the circulatory system: Secondary | ICD-10-CM | POA: Insufficient documentation

## 2021-09-09 DIAGNOSIS — I110 Hypertensive heart disease with heart failure: Secondary | ICD-10-CM | POA: Diagnosis not present

## 2021-09-09 DIAGNOSIS — Z7984 Long term (current) use of oral hypoglycemic drugs: Secondary | ICD-10-CM | POA: Insufficient documentation

## 2021-09-09 DIAGNOSIS — G4733 Obstructive sleep apnea (adult) (pediatric): Secondary | ICD-10-CM

## 2021-09-09 DIAGNOSIS — I10 Essential (primary) hypertension: Secondary | ICD-10-CM | POA: Diagnosis not present

## 2021-09-09 DIAGNOSIS — E119 Type 2 diabetes mellitus without complications: Secondary | ICD-10-CM | POA: Diagnosis not present

## 2021-09-09 DIAGNOSIS — Z79899 Other long term (current) drug therapy: Secondary | ICD-10-CM | POA: Diagnosis not present

## 2021-09-09 DIAGNOSIS — I5021 Acute systolic (congestive) heart failure: Secondary | ICD-10-CM

## 2021-09-09 DIAGNOSIS — Z87891 Personal history of nicotine dependence: Secondary | ICD-10-CM | POA: Diagnosis not present

## 2021-09-09 DIAGNOSIS — I5022 Chronic systolic (congestive) heart failure: Secondary | ICD-10-CM | POA: Insufficient documentation

## 2021-09-09 DIAGNOSIS — Z6841 Body Mass Index (BMI) 40.0 and over, adult: Secondary | ICD-10-CM | POA: Diagnosis not present

## 2021-09-09 LAB — ECHOCARDIOGRAM COMPLETE
AR max vel: 3.86 cm2
AV Peak grad: 7.3 mmHg
Ao pk vel: 1.36 m/s
Area-P 1/2: 5.27 cm2
S' Lateral: 4.2 cm
Single Plane A4C EF: 45.2 %

## 2021-09-09 LAB — COMPREHENSIVE METABOLIC PANEL
ALT: 17 U/L (ref 0–44)
AST: 13 U/L — ABNORMAL LOW (ref 15–41)
Albumin: 4 g/dL (ref 3.5–5.0)
Alkaline Phosphatase: 64 U/L (ref 38–126)
Anion gap: 9 (ref 5–15)
BUN: 10 mg/dL (ref 6–20)
CO2: 27 mmol/L (ref 22–32)
Calcium: 9.5 mg/dL (ref 8.9–10.3)
Chloride: 104 mmol/L (ref 98–111)
Creatinine, Ser: 1.08 mg/dL (ref 0.61–1.24)
GFR, Estimated: >60 mL/min (ref >60)
Glucose, Bld: 99 mg/dL (ref 70–99)
Potassium: 4.3 mmol/L (ref 3.5–5.1)
Sodium: 140 mmol/L (ref 135–145)
Total Bilirubin: 0.5 mg/dL (ref 0.3–1.2)
Total Protein: 7.6 g/dL (ref 6.5–8.1)

## 2021-09-09 LAB — BRAIN NATRIURETIC PEPTIDE: B Natriuretic Peptide: 15.6 pg/mL (ref 0.0–100.0)

## 2021-09-09 MED ORDER — EPLERENONE 25 MG PO TABS
25.0000 mg | ORAL_TABLET | Freq: Every day | ORAL | 3 refills | Status: DC
  Filled 2021-09-09: qty 30, 30d supply, fill #0
  Filled 2021-10-08: qty 30, 30d supply, fill #1
  Filled 2021-11-09: qty 30, 30d supply, fill #2
  Filled 2021-12-16: qty 30, 30d supply, fill #3

## 2021-09-09 MED ORDER — HYDRALAZINE HCL 50 MG PO TABS
50.0000 mg | ORAL_TABLET | Freq: Three times a day (TID) | ORAL | 3 refills | Status: DC
  Filled 2021-09-09: qty 90, 30d supply, fill #0
  Filled 2021-10-08: qty 90, 30d supply, fill #1
  Filled 2021-11-09: qty 90, 30d supply, fill #2
  Filled 2021-12-16: qty 90, 30d supply, fill #3

## 2021-09-09 NOTE — Progress Notes (Signed)
ADVANCED HF CLINIC CONSULT NOTE     Referring Physician: Dr. Avon Gully Primary Care: Dr. [place] Primary Cardiologist: Dr. Margaretann Loveless   HPI: Referred to clinic by Dr. Avon Gully with [facility] for heart failure consultation. 46 y.o. male with history of longstanding ucontrolled HTN (at least 7 years), morbid obesity, DM, polysubstance abuse (tobacco use, marijuana daily, cocaine), OSA on CPAP, lumbar stenosis.   Admitted 03/23 with CP and dyspnea. Hypertension uncontrolled. BNP WNL, HS troponin 24>22. CXR with evidence of CHF. CTA chest with no evidence of PE however difficult study due to limited opacification of the pulmonary arteries. Coronary calcifications noted. Echo EF 25-30%, moderate concentric LVH, RV okay, mild MR. Coronary CTA with minimal CAD, calcium score 24. Etiology of CM felt to be likely d/t hypertension. No significant renal artery stenosis on renal duplex. UDS + for THC, but no cocaine. Started on carvedilol (no selective BB with hx cocaine use), entresto, farxiga, and lasix 20 mg BID.  [facility] and GDMT titrated. Referred here for HF care  Echo today 09/09/21 EF 55% G2DD  Feeling better. Compliant with med. SBP still labile average BP 139/88 but has some lows and some highs still. Breathing better orthopnea or PND. Severe HA with Imdur    SH: Has not smoked a cigarette in 3 months, smokes marijuana daily to help with back pain. Reports fairly heavy cocaine use last year, states he has not used since January  2023.  FH: No family history of CHF. Multiple family members with HTN.   Review of Systems: [y] = yes, _0  = no   General: Weight gain [Y]; Weight loss _1 ; Anorexia _2 ; Fatigue _3 ; Fever _4 ; Chills _5 ; Weakness _6   Cardiac: Chest pain/pressure _7 ; Resting SOB _8 ; Exertional SOB [Y]; Orthopnea _9 ; Pedal Edema _10 ; Palpitations _11 ; Syncope _12 ; Presyncope _13 ; Paroxysmal nocturnal dyspnea_14   Pulmonary: Cough _15 ; Wheezing[  ]; Hemoptysis_16 ; Sputum _17 ; Snoring Blue.Reese ]  GI: Vomiting_18 ; Dysphagia_19 ; Melena_20 ; Hematochezia _21 ; Heartburn_22 ; Abdominal pain _23 ; Constipation _24 ; Diarrhea _25 ; BRBPR _26   GU: Hematuria_27 ; Dysuria _28 ; Nocturia_29   Vascular: Pain in legs with walking [Y]; Pain in feet with lying flat _30 ; Non-healing sores _31 ; Stroke _32 ; TIA _33 ; Slurred speech _34 ;  Neuro: Headaches_35 ; Vertigo_36 ; Seizures_37 ; Paresthesias_38 ;Blurred vision _39 ; Diplopia _40 ; Vision changes _41   Ortho/Skin: Arthritis [ y]; Joint pain Blue.Reese ]; Muscle pain _42 ; Joint swelling _43 ; Back Pain [Y]; Rash _44   Psych: Depression[Y]; Anxiety[Y]  Heme: Bleeding problems _45 ; Clotting disorders _46 ; Anemia _47   Endocrine: Diabetes [Y]; Thyroid dysfunction_48    Past Medical History:  Diagnosis Date   Anxiety    Depression    Diabetes mellitus type 2 in obese ([facility])    Hypertension    Manic disorder ([facility])    Obesity     Current Outpatient Medications  Medication Sig Dispense Refill   acetaminophen (TYLENOL) 500 MG tablet Take 2 tablets (1,000 mg total) by mouth every 6 (six) hours as needed for moderate pain. 30 tablet 1   amLODipine (NORVASC) 5 MG tablet Take 1 tablet (5 mg total) by mouth daily. 30 tablet 1   aspirin 81 MG chewable tablet Chew 1 tablet (81 mg total) by mouth daily. 30 tablet 11  blood glucose meter kit and supplies KIT Dispense based on patient and insurance preference. Use up to four times daily as directed. (FOR ICD-9 250.00, 250.01). 1 each 0   buPROPion (WELLBUTRIN SR) 150 MG 12 hr tablet Take 1 tablet (150 mg total) by mouth 2 (two) times daily. 60 tablet 1   busPIRone (BUSPAR) 15 MG tablet Take 1 tablet (15 mg total) by mouth 3 (three) times daily as needed (anxiety). 90 tablet 0   carvedilol (COREG) 25 MG tablet Take 1 tablet (25 mg total) by mouth 2 (two) times daily. 60 tablet 3   cetirizine (ZYRTEC) 10 MG tablet Take 1 tablet (10 mg total) by mouth daily. 100 tablet 0   dapagliflozin  propanediol (FARXIGA) 10 MG TABS tablet Take 1 tablet (10 mg total) by mouth daily. 30 tablet 6   FLUoxetine (PROZAC) 20 MG capsule Take 1 capsule (20 mg total) by mouth daily. 30 capsule 1   fluticasone (FLONASE) 50 MCG/ACT nasal spray Place 2 sprays into both nostrils daily as needed for allergies. 16 g 1   furosemide (LASIX) 20 MG tablet Take 1 tablet (20 mg total) by mouth 2 (two) times daily. 60 tablet 1   gabapentin (NEURONTIN) 300 MG capsule Take 1 capsule (300 mg total) by mouth 2 (two) times daily. 60 capsule 0   glipiZIDE (GLUCOTROL) 5 MG tablet Take 1 tablet (5 mg total) by mouth 2 (two) times daily. 60 tablet 1   guaiFENesin (MUCINEX) 600 MG 12 hr tablet Take 1 tablet (600 mg total) by mouth 2 (two) times daily. 20 tablet 0   hydrALAZINE (APRESOLINE) 25 MG tablet Take 1 tablet (25 mg total) by mouth 3 (three) times daily. 90 tablet 2   isosorbide mononitrate (IMDUR) 30 MG 24 hr tablet Take 1 tablet (30 mg total) by mouth daily. 30 tablet 3   lithium carbonate 300 MG capsule Take 1 capsule (300 mg total) by mouth 2 (two) times daily. 60 capsule 0   metFORMIN (GLUCOPHAGE) 1000 MG tablet Take 1 tablet (1,000 mg total) by mouth 2 (two) times daily. 180 tablet 1   Multiple Vitamin (MULTIVITAMIN WITH MINERALS) TABS tablet Take 1 tablet by mouth daily. 100 tablet 0   NON FORMULARY Pt uses a cpap nightly     ondansetron (ZOFRAN-ODT) 8 MG disintegrating tablet Dissolve 1 tablet under the tongue every 8 (eight) hours as needed for nausea. 20 tablet 0   potassium chloride SA (KLOR-CON M) 20 MEQ tablet Take 2 tablets (40 mEq total) by mouth daily. 30 tablet 3   rosuvastatin (CRESTOR) 5 MG tablet Take 1 tablet (5 mg total) by mouth daily. 30 tablet 0   sacubitril-valsartan (ENTRESTO) 97-103 MG Take 1 tablet by mouth 2 (two) times daily. 60 tablet 6   spironolactone (ALDACTONE) 25 MG tablet Take 1 tablet (25 mg total) by mouth daily. 30 tablet 1   Vitamin D, Ergocalciferol, (DRISDOL) 1.25 MG (50000  UNIT) CAPS capsule Take 1 capsule (50,000 Units total) by mouth once a week. 30 capsule 0   No current facility-administered medications for this encounter.    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,  truck driver  Tobacco Use   Smoking status: Former    Packs/day: 0.25    Years: 32.00    Total pack years: 8.00    Types: Cigarettes    Quit date: 04/2021    Years since quitting: 0.4   Smokeless tobacco: Never  Vaping Use   Vaping Use: Never used  Substance and Sexual Activity   Alcohol use: Yes    Comment: occasional   Drug use: Yes    Types: Marijuana    Comment: routinely   Sexual activity: Not on file  Other Topics Concern   Not on file  Social History Narrative   Not on file   Social Determinants of Health   Financial Resource Strain: Low Risk  (06/02/2021)   Overall Financial Resource Strain (CARDIA)    Difficulty of Paying Living Expenses: Not hard at all  Food Insecurity: No Food Insecurity (06/02/2021)   Hunger Vital Sign    Worried About Running Out of Food in the Last Year: Never true    Ran Out of Food in the Last Year: Never true  Transportation Needs: No Transportation Needs (06/02/2021)   PRAPARE - Hydrologist (Medical): No    Lack of Transportation (Non-Medical): No  Physical Activity: Not on file  Stress: Not on file  Social Connections: Not on file  Intimate Partner Violence: Not on file      Family History  Problem Relation Age of Onset   Seizures Brother    Heart failure Neg Hx     Vitals:   09/09/21 1047  BP: (!) 160/80  Pulse: 89  SpO2: 97%  Weight: (!) 139.1 kg (306 lb 9.6 oz)    PHYSICAL  EXAM: General:  Obese male. No resp difficulty HEENT: normal Neck: supple. no JVD. Carotids 2+ bilat; no bruits. No lymphadenopathy or thryomegaly appreciated. Cor: PMI nondisplaced. Regular rate & rhythm. No rubs, gallops or murmurs. Lungs: clear Abdomen: soft, nontender, nondistended. No hepatosplenomegaly. No bruits or masses. Good bowel sounds. Extremities: no cyanosis, clubbing, rash, edema Neuro: alert & orientedx3, cranial nerves grossly intact. moves all 4 extremities w/o difficulty. Affect pleasant    ASSESSMENT & PLAN:  Chronic systolic CHF (new)/NICM: - Admit 03/23 with new HF  - Echo 03/23: EF 25-30%, moderate LVH, RV okay, mild MR, dilated IVC with estimated RAP 15 mmHg - Coronary CTA with no evidence of obstructive CAD, calcium score 24 - Etiology of CM felt to be most likely HTN. Longstanding uncontrolled HTN. ? How much cocaine contributing. Has not used since January, but previously used frequently. - Echo today 09/09/21 EF 55% G2DD - NYHA IIIa. Appears mildly volume overloaded on exam. Will have him increase lasix to 40 mg BID X 2 days and take an extra 20 mEq K those days. Then decrease lasix back to 20 mg BID.  - Continue Farxiga 10 mg daily - Continue Coreg 25 mg BID - Continue Entresto to 97/103 mg BID d/t elevated BP. - Increase hydralazine to 50 tid. Stop Imdur with HAs - Switch spiro to eplerenone due to gynecomastia - Continue amlodipine - EF has recovered with BP control. Can f/u with [facility]. Refer back to HF Clinic as needed.   2. Hypertension: - Improving. Med changes as above - Recent renal artery duplex with no evidence of renal artery stenosis. - Work on weight loss and reducing sodium intake, referred to nutritionist.  3. Coronary atherosclerosis: - Ca score of 24 - Started on 5  mg Rosuvastatin during recent admit.   4. OSA: -Awaiting CPAP. Really needs this   5. Substance abuse: - Quit smoking cigarettes 3 months ago - Daily marijuana use  noted - Last used cocaine 01/23. Reports heavy use last year. Complete cessation discussed.  6. DM II: - A1c 7.7 in 02/23 -Continue Farxiga -Per PCP  7. Obesity: - BMI 42 - Referred to nutritionist  Glori Bickers, MD  11:23 AM

## 2021-09-09 NOTE — Patient Instructions (Signed)
STOP Isosorbide (Imdur)  STOP Spironolactone  START Epleronone 25 mg Daily   Increase Hydralazine to 50 mg Three times a day   Labs done today, your results will be available in MyChart, we will contact you for abnormal readings.  CONGRATULATIONS!!!! You have graduated the [facility], please follow up with [facility] as scheduled  Do the following things EVERYDAY: Weigh yourself in the morning before breakfast. Write it down and keep it in a log. Take your medicines as prescribed Eat low salt foods--Limit salt (sodium) to 2000 mg per day.  Stay as active as you can everyday Limit all fluids for the day to less than 2 liters  If you have any questions or concerns before your next appointment please send [place] a message through [place] or call our office at [phone].    TO LEAVE A MESSAGE FOR THE NURSE SELECT OPTION 2, PLEASE LEAVE A MESSAGE INCLUDING: YOUR NAME DATE OF BIRTH CALL BACK NUMBER REASON FOR CALL**this is important as we prioritize the call backs  YOU WILL RECEIVE A CALL BACK THE SAME DAY AS LONG AS YOU CALL BEFORE 4:00 PM

## 2021-09-11 MED ORDER — CETIRIZINE HCL 10 MG PO TABS
10.0000 mg | ORAL_TABLET | Freq: Every day | ORAL | 0 refills | Status: DC
  Filled 2021-09-11: qty 100, 100d supply, fill #0

## 2021-09-12 ENCOUNTER — Other Ambulatory Visit (HOSPITAL_BASED_OUTPATIENT_CLINIC_OR_DEPARTMENT_OTHER): Payer: Self-pay

## 2021-09-12 ENCOUNTER — Ambulatory Visit (HOSPITAL_COMMUNITY): Payer: 59

## 2021-09-12 ENCOUNTER — Other Ambulatory Visit (HOSPITAL_BASED_OUTPATIENT_CLINIC_OR_DEPARTMENT_OTHER): Payer: Self-pay | Admitting: Family Medicine

## 2021-09-12 MED ORDER — FUROSEMIDE 20 MG PO TABS
20.0000 mg | ORAL_TABLET | Freq: Two times a day (BID) | ORAL | 1 refills | Status: DC
  Filled 2021-09-12: qty 60, 30d supply, fill #0
  Filled 2021-10-10: qty 60, 30d supply, fill #1

## 2021-09-12 MED ORDER — GLIPIZIDE 5 MG PO TABS
5.0000 mg | ORAL_TABLET | Freq: Two times a day (BID) | ORAL | 1 refills | Status: DC
  Filled 2021-09-12: qty 60, 30d supply, fill #0
  Filled 2021-09-23 – 2021-10-10 (×2): qty 60, 30d supply, fill #1

## 2021-09-12 NOTE — Assessment & Plan Note (Signed)
A few days ago, patient began to notice some nausea and vomiting.  He is not aware of any sick contacts, not certain of any potential unusual foods or dietary changes.  He has not had any fevers.  More recently, he has been having nausea without vomiting.  Over the last day or so he has been tolerating better p.o. intake.  He did check vital signs at home today and blood pressure is 135/82 and pulse is 102.  This is borderline tachycardic, however on review of chart, patient does tend to have a higher pulse rate, and generally near 90-100.  Denies any issues with lightheadedness or dizziness.  Has had some increased fatigue recently. During visit, no obvious dyspnea, patient able to speak in full sentences.  He was alert and interactive Uncertain etiology for recent nausea and vomiting, suspect infectious etiology given short-lived nature and that it has been resolving at present.  He still has some nausea, discussed options, will proceed with use of Zofran to help with controlling this as needed.  Recommend gradual progression of diet as long as he is tolerating it.  Stressed importance of remaining hydrated, could consider use of oral rehydration solution such as Pedialyte.  Discussed red flags for patient to be aware of which should prompt presenting to the emergency department for further evaluation

## 2021-09-14 ENCOUNTER — Ambulatory Visit (HOSPITAL_COMMUNITY): Payer: 59

## 2021-09-14 ENCOUNTER — Other Ambulatory Visit (HOSPITAL_BASED_OUTPATIENT_CLINIC_OR_DEPARTMENT_OTHER): Payer: Self-pay

## 2021-09-16 ENCOUNTER — Ambulatory Visit (HOSPITAL_COMMUNITY): Payer: 59

## 2021-09-19 ENCOUNTER — Ambulatory Visit (HOSPITAL_COMMUNITY): Payer: 59

## 2021-09-20 ENCOUNTER — Emergency Department (HOSPITAL_COMMUNITY)
Admission: EM | Admit: 2021-09-20 | Discharge: 2021-09-20 | Disposition: A | Discharge status: Home / Self Care | Payer: 59 | Attending: Emergency Medicine | Admitting: Emergency Medicine

## 2021-09-20 ENCOUNTER — Emergency Department (HOSPITAL_COMMUNITY): Payer: 59

## 2021-09-20 ENCOUNTER — Other Ambulatory Visit: Payer: Self-pay

## 2021-09-20 DIAGNOSIS — R42 Dizziness and giddiness: Secondary | ICD-10-CM | POA: Diagnosis present

## 2021-09-20 DIAGNOSIS — R61 Generalized hyperhidrosis: Secondary | ICD-10-CM

## 2021-09-20 DIAGNOSIS — Z7982 Long term (current) use of aspirin: Secondary | ICD-10-CM | POA: Diagnosis not present

## 2021-09-20 DIAGNOSIS — Z7984 Long term (current) use of oral hypoglycemic drugs: Secondary | ICD-10-CM | POA: Insufficient documentation

## 2021-09-20 DIAGNOSIS — Z79899 Other long term (current) drug therapy: Secondary | ICD-10-CM | POA: Insufficient documentation

## 2021-09-20 DIAGNOSIS — I10 Essential (primary) hypertension: Secondary | ICD-10-CM | POA: Insufficient documentation

## 2021-09-20 DIAGNOSIS — E11649 Type 2 diabetes mellitus with hypoglycemia without coma: Secondary | ICD-10-CM | POA: Insufficient documentation

## 2021-09-20 DIAGNOSIS — E162 Hypoglycemia, unspecified: Secondary | ICD-10-CM

## 2021-09-20 LAB — COMPREHENSIVE METABOLIC PANEL
ALT: 19 U/L (ref 0–44)
AST: 18 U/L (ref 15–41)
Albumin: 4.1 g/dL (ref 3.5–5.0)
Alkaline Phosphatase: 78 U/L (ref 38–126)
Anion gap: 13 (ref 5–15)
BUN: 16 mg/dL (ref 6–20)
CO2: 23 mmol/L (ref 22–32)
Calcium: 9.8 mg/dL (ref 8.9–10.3)
Chloride: 100 mmol/L (ref 98–111)
Creatinine, Ser: 1.07 mg/dL (ref 0.61–1.24)
GFR, Estimated: >60 mL/min (ref >60)
Glucose, Bld: 59 mg/dL — ABNORMAL LOW (ref 70–99)
Potassium: 3.7 mmol/L (ref 3.5–5.1)
Sodium: 136 mmol/L (ref 135–145)
Total Bilirubin: 0.5 mg/dL (ref 0.3–1.2)
Total Protein: 7.4 g/dL (ref 6.5–8.1)

## 2021-09-20 LAB — URINALYSIS, ROUTINE W REFLEX MICROSCOPIC
Bacteria, UA: NONE SEEN
Bilirubin Urine: NEGATIVE
Glucose, UA: >=500 mg/dL — AB
Hgb urine dipstick: NEGATIVE
Ketones, ur: NEGATIVE mg/dL
Leukocytes,Ua: NEGATIVE
Nitrite: NEGATIVE
Protein, ur: NEGATIVE mg/dL
Specific Gravity, Urine: 1.016 (ref 1.005–1.030)
pH: 7 (ref 5.0–8.0)

## 2021-09-20 LAB — CBC WITH DIFFERENTIAL/PLATELET
Abs Immature Granulocytes: 0.11 10*3/uL — ABNORMAL HIGH (ref 0.00–0.07)
Basophils Absolute: 0.1 10*3/uL (ref 0.0–0.1)
Basophils Relative: 0 %
Eosinophils Absolute: 0.1 10*3/uL (ref 0.0–0.5)
Eosinophils Relative: 0 %
HCT: 43.2 % (ref 39.0–52.0)
Hemoglobin: 14.4 g/dL (ref 13.0–17.0)
Immature Granulocytes: 1 %
Lymphocytes Relative: 15 %
Lymphs Abs: 2.6 10*3/uL (ref 0.7–4.0)
MCH: 26.9 pg (ref 26.0–34.0)
MCHC: 33.3 g/dL (ref 30.0–36.0)
MCV: 80.7 fL (ref 80.0–100.0)
Monocytes Absolute: 1.2 10*3/uL — ABNORMAL HIGH (ref 0.1–1.0)
Monocytes Relative: 7 %
Neutro Abs: 13.7 10*3/uL — ABNORMAL HIGH (ref 1.7–7.7)
Neutrophils Relative %: 77 %
Platelets: 341 10*3/uL (ref 150–400)
RBC: 5.35 MIL/uL (ref 4.22–5.81)
RDW: 14.6 % (ref 11.5–15.5)
WBC: 17.8 10*3/uL — ABNORMAL HIGH (ref 4.0–10.5)
nRBC: 0 % (ref 0.0–0.2)

## 2021-09-20 LAB — RAPID URINE DRUG SCREEN, HOSP PERFORMED
Amphetamines: NOT DETECTED
Barbiturates: NOT DETECTED
Benzodiazepines: NOT DETECTED
Cocaine: NOT DETECTED
Opiates: NOT DETECTED
Tetrahydrocannabinol: POSITIVE — AB

## 2021-09-20 LAB — CBG MONITORING, ED
Glucose-Capillary: 174 mg/dL — ABNORMAL HIGH (ref 70–99)
Glucose-Capillary: 72 mg/dL (ref 70–99)
Glucose-Capillary: 85 mg/dL (ref 70–99)
Glucose-Capillary: 129 mg/dL — ABNORMAL HIGH (ref 70–99)

## 2021-09-20 LAB — CK: Total CK: 112 U/L (ref 49–397)

## 2021-09-20 LAB — TROPONIN I (HIGH SENSITIVITY): Troponin I (High Sensitivity): 9 ng/L (ref <18)

## 2021-09-20 MED ORDER — SODIUM CHLORIDE 0.9 % IV BOLUS
1000.0000 mL | Freq: Once | INTRAVENOUS | Status: AC
  Administered 2021-09-20: 1000 mL via INTRAVENOUS

## 2021-09-20 MED ORDER — DEXTROSE 50 % IV SOLN
INTRAVENOUS | Status: AC
  Administered 2021-09-20: 50 mL via INTRAVENOUS
  Filled 2021-09-20: qty 50

## 2021-09-20 MED ORDER — ONDANSETRON HCL 4 MG/2ML IJ SOLN: 4.0000 mg | Freq: Once | INTRAMUSCULAR | Status: DC

## 2021-09-20 MED ORDER — DEXTROSE 50 % IV SOLN: 50.0000 mL | Freq: Once | INTRAVENOUS | Status: AC

## 2021-09-20 NOTE — Discharge Instructions (Signed)
Your blood sugar was low and that is likely causing your symptoms  Please stop taking glipizide for now.  You may continue metformin.  Please check your blood sugar frequently.  Please see your doctor this week for follow up  Return to ER if you have worse dizziness, passing out, blood sugar less than 60 or greater than 600

## 2021-09-20 NOTE — ED Notes (Signed)
Patient verbalizes understanding of d/c instructions. Opportunities for questions and answers were provided. Pt d/c from ED and ambulated to lobby with wife.  

## 2021-09-20 NOTE — ED Triage Notes (Signed)
Pt states that he was sitting down and then all of a sudden he became extreme weak and sweaty and thought he was going to pass out. BG 69. Fire gave glucose tablet. Ems report that he was in and out enroute to hospital but was never completely unresponsive.

## 2021-09-20 NOTE — ED Provider Notes (Signed)
[facility] EMERGENCY DEPARTMENT Provider Note   CSN: [id number] Arrival date & time: 09/20/21  1524     History  Chief Complaint  Patient presents with   Weakness    Jordan Sparks is a 46 y.o. male history of hypertension, diabetes here presenting with diaphoresis.  Patient states that he was not feeling well today.  He states that he was in the house and just felt very lightheaded and dizzy and very sweaty like he is going to pass out.  Denies any chest pain or abdominal pain.  Patient was noted to be hypoglycemic with sugar 69.  He states that he did not eat much today.  He is on metformin and glipizide and not on any injectable insulin.  Patient was given glucose tablet prior to arrival by EMS.   The history is provided by the patient.       Home Medications Prior to Admission medications   Medication Sig Start Date End Date Taking? Authorizing Provider  acetaminophen (TYLENOL) 500 MG tablet Take 2 tablets (1,000 mg total) by mouth every 6 (six) hours as needed for moderate pain. 09/01/21   [place], Blondell Reveal, MD  amLODipine (NORVASC) 5 MG tablet Take 1 tablet (5 mg total) by mouth daily. 08/30/21   [place], Raymond J, MD  aspirin 81 MG chewable tablet Chew 1 tablet (81 mg total) by mouth daily. 06/03/21   Little Ishikawa, MD  blood glucose meter kit and supplies KIT Dispense based on patient and insurance preference. Use up to four times daily as directed. (FOR ICD-9 250.00, 250.01). 06/28/21   [place], Blondell Reveal, MD  buPROPion [facility] SR) 150 MG 12 hr tablet Take 1 tablet (150 mg total) by mouth 2 (two) times daily. 08/17/21 10/17/21  [place], Raymond J, MD  busPIRone (BUSPAR) 15 MG tablet Take 1 tablet (15 mg total) by mouth 3 (three) times daily as needed (anxiety). 06/16/21   [place], Blondell Reveal, MD  carvedilol (COREG) 25 MG tablet Take 1 tablet (25 mg total) by mouth 2 (two) times daily. 09/01/21 11/30/21  Sande Rives E, PA-C  cetirizine (ZYRTEC)  10 MG tablet Take 1 tablet (10 mg total) by mouth daily. 09/11/21   [place], Blondell Reveal, MD  dapagliflozin propanediol (FARXIGA) 10 MG TABS tablet Take 1 tablet (10 mg total) by mouth daily. 08/29/21   [place], Raymond J, MD  eplerenone (INSPRA) 25 MG tablet Take 1 tablet (25 mg total) by mouth daily. 09/09/21   Bensimhon, Shaune Pascal, MD  FLUoxetine (PROZAC) 20 MG capsule Take 1 capsule (20 mg total) by mouth daily. 08/02/21   [place], Blondell Reveal, MD  fluticasone [facility]) 50 MCG/ACT nasal spray Place 2 sprays into both nostrils daily as needed for allergies. 08/29/21   [place], Blondell Reveal, MD  furosemide (LASIX) 20 MG tablet Take 1 tablet (20 mg total) by mouth 2 (two) times daily. 09/12/21   [place], Raymond J, MD  gabapentin (NEURONTIN) 300 MG capsule Take 1 capsule (300 mg total) by mouth 2 (two) times daily. 08/29/21   [place], Blondell Reveal, MD  glipiZIDE (GLUCOTROL) 5 MG tablet Take 1 tablet (5 mg total) by mouth 2 (two) times daily. 09/12/21   [place], Blondell Reveal, MD  guaiFENesin (MUCINEX) 600 MG 12 hr tablet Take 1 tablet (600 mg total) by mouth 2 (two) times daily. 06/06/21   [place], Raymond J, MD  hydrALAZINE (APRESOLINE) 50 MG tablet Take 1  tablet (50 mg total) by mouth 3 (three) times daily. 09/09/21   Bensimhon, Shaune Pascal, MD  lithium carbonate 300 MG capsule Take 1 capsule (300 mg total) by mouth 2 (two) times daily. 08/17/21   [place], Blondell Reveal, MD  metFORMIN (GLUCOPHAGE) 1000 MG tablet Take 1 tablet (1,000 mg total) by mouth 2 (two) times daily. 05/04/21   [place], Raymond J, MD  Multiple Vitamin (MULTIVITAMIN WITH MINERALS) TABS tablet Take 1 tablet by mouth daily. 08/17/21   [place], Blondell Reveal, MD  NON FORMULARY Pt uses a cpap nightly    [provider]  ondansetron (ZOFRAN-ODT) 8 MG disintegrating tablet Dissolve 1 tablet under the tongue every 8 (eight) hours as needed for nausea. 08/29/21   [place], Blondell Reveal, MD  potassium chloride SA (KLOR-CON M) 20 MEQ tablet Take 2 tablets (40 mEq total) by  mouth daily. 07/01/21   Joette Catching, PA-C  rosuvastatin (CRESTOR) 5 MG tablet Take 1 tablet (5 mg total) by mouth daily. 06/04/21   Little Ishikawa, MD  sacubitril-valsartan (ENTRESTO) 97-103 MG Take 1 tablet by mouth 2 (two) times daily. 06/14/21   Joette Catching, PA-C  Vitamin D, Ergocalciferol, (DRISDOL) 1.25 MG (50000 UNIT) CAPS capsule Take 1 capsule (50,000 Units total) by mouth once a week. 08/17/21   [place], Raymond J, MD      Allergies    Lisinopril    Review of Systems   Review of Systems  Constitutional:  Positive for diaphoresis and fatigue.  Neurological:  Positive for dizziness.  All other systems reviewed and are negative.   Physical Exam Updated Vital Signs BP (!) 158/105 (BP Location: Left Arm)   Pulse 66   Temp 98.5 F (36.9 C) (Oral)   Resp 18   SpO2 100%  Physical Exam Vitals and nursing note reviewed.  Constitutional:      Comments: Diaphoretic and weak  HENT:     Head: Normocephalic.     Nose: Nose normal.     Mouth/Throat:     Mouth: Mucous membranes are dry.  Eyes:     Extraocular Movements: Extraocular movements intact.     Pupils: Pupils are equal, round, and reactive to light.  Cardiovascular:     Rate and Rhythm: Normal rate and regular rhythm.     Pulses: Normal pulses.     Heart sounds: Normal heart sounds.  Pulmonary:     Effort: Pulmonary effort is normal.     Breath sounds: Normal breath sounds.  Abdominal:     General: Abdomen is flat.     Palpations: Abdomen is soft.  Musculoskeletal:        General: Normal range of motion.     Cervical back: Normal range of motion and neck supple.  Skin:    General: Skin is warm.  Neurological:     Comments: No slurred speech or facial droop.  Patient has normal strength bilateral arms and legs  Psychiatric:        Mood and Affect: Mood normal.        Behavior: Behavior normal.     ED Results / Procedures / Treatments   Labs (all labs ordered are listed, but only  abnormal results are displayed) Labs Reviewed  CBC WITH DIFFERENTIAL/PLATELET - Abnormal; Notable for the following components:      Result Value   WBC 17.8 (*)    Neutro Abs 13.7 (*)    Monocytes Absolute 1.2 (*)    Abs  Immature Granulocytes 0.11 (*)    All other components within normal limits  COMPREHENSIVE METABOLIC PANEL - Abnormal; Notable for the following components:   Glucose, Bld 59 (*)    All other components within normal limits  RAPID URINE DRUG SCREEN, HOSP PERFORMED - Abnormal; Notable for the following components:   Tetrahydrocannabinol POSITIVE (*)    All other components within normal limits  URINALYSIS, ROUTINE W REFLEX MICROSCOPIC - Abnormal; Notable for the following components:   Color, Urine STRAW (*)    Glucose, UA >=500 (*)    All other components within normal limits  CBG MONITORING, ED - Abnormal; Notable for the following components:   Glucose-Capillary 174 (*)    All other components within normal limits  CK  ETHANOL  SALICYLATE LEVEL  ACETAMINOPHEN LEVEL  CBG MONITORING, ED  CBG MONITORING, ED  TROPONIN I (HIGH SENSITIVITY)  TROPONIN I (HIGH SENSITIVITY)    EKG EKG Interpretation  Date/Time:  Tuesday September 20 2021 15:28:08 EDT Ventricular Rate:  76 PR Interval:  216 QRS Duration: 109 QT Interval:  412 QTC Calculation: 464 R Axis:   81 Text Interpretation: Sinus rhythm Prolonged PR interval Consider left atrial enlargement Incomplete left bundle branch block No significant change since last tracing Confirmed by Wandra Arthurs [id number]) on 09/20/2021 3:32:11 PM  Radiology CT HEAD WO CONTRAST (5MM)  Result Date: 09/20/2021 CLINICAL DATA:  Dizziness EXAM: CT HEAD WITHOUT CONTRAST TECHNIQUE: Contiguous axial images were obtained from the base of the skull through the vertex without intravenous contrast. RADIATION DOSE REDUCTION: This exam was performed according to the departmental dose-optimization program which includes automated exposure control,  adjustment of the mA and/or kV according to patient size and/or use of iterative reconstruction technique. COMPARISON:  CT brain 11/20/2020 FINDINGS: Brain: No evidence of acute infarction, hemorrhage, hydrocephalus, extra-axial collection or mass lesion/mass effect. Vascular: No hyperdense vessel or unexpected calcification. Skull: Normal. Negative for fracture or focal lesion. Sinuses/Orbits: No acute finding. Other: None IMPRESSION: Negative non contrasted CT appearance of the brain. Electronically Signed   By: Donavan Foil M.D.   On: 09/20/2021 17:47   DG Chest Port 1 View  Result Date: 09/20/2021 CLINICAL DATA:  Provided history: Weakness. Additional history provided: Weakness, near loss of consciousness today. EXAM: PORTABLE CHEST 1 VIEW COMPARISON:  Prior chest radiographs 05/31/2021 and earlier. CT angiogram chest 05/31/2021. FINDINGS: Cardiomegaly. Central pulmonary vascular congestion without overt pulmonary edema. No appreciable airspace consolidation. No evidence of pleural effusion or pneumothorax. No acute bony abnormality identified. IMPRESSION: Cardiomegaly. Central pulmonary vascular congestion without overt pulmonary edema. No appreciable airspace consolidation. Electronically Signed   By: Kellie Simmering D.O.   On: 09/20/2021 16:53    Procedures Procedures    Medications Ordered in ED Medications  ondansetron (ZOFRAN) injection 4 mg (has no administration in time range)  sodium chloride 0.9 % bolus 1,000 mL (1,000 mLs Intravenous New Bag/Given 09/20/21 1614)  dextrose 50 % solution 50 mL (50 mLs Intravenous Given 09/20/21 1611)    ED Course/ Medical Decision Making/ A&P                           Medical Decision Making Jordan Sparks is a 46 y.o. male here presenting with diaphoresis.  Patient likely is diaphoretic from hypoglycemia.  Also consider heat exhaustion and less likely intracranial hemorrhage.  He has no chest pain or abdominal pain.  I do not think patient has  a dissection right now.  Plan to get CBC and CMP and CK level and CT head and chest x-ray.  Will hydrate patient and p.o. trial and reassess  4 pm Patient is still diaphoretic and his repeat CBG is down to 75.  Will give D50 and give more food and reassess  7:34 PM I reviewed patient's labs and independently reviewed imaging studies.  Initial glucose was 59.  After several hours, patient was able to eat and drink and felt much better.  Now his sugar is up to 129.  Urinalysis unremarkable and CK level was normal.  His white blood cell count is slightly elevated at 17 but he has no signs of UTI or pneumonia. CT head is unremarkable.  Patient is feeling much better now.  He is able to tolerate p.o.  I think the hypoglycemia is likely causing his symptoms.  I do not think he is septic right now.  I told him to stay hydrated and to hold glipizide for now.  He should check his blood sugar frequently and eat normally and follow-up with his primary care doctor   Problems Addressed: Diaphoresis: acute illness or injury Hypoglycemia: acute illness or injury  Amount and/or Complexity of Data Reviewed Labs: ordered. Decision-making details documented in ED Course. Radiology: ordered and independent interpretation performed. Decision-making details documented in ED Course.  Risk Prescription drug management.   Final Clinical Impression(s) / ED Diagnoses Final diagnoses:  None    Rx / DC Orders ED Discharge Orders     None         Drenda Freeze, MD 09/20/21 [phone]

## 2021-09-21 ENCOUNTER — Ambulatory Visit (HOSPITAL_BASED_OUTPATIENT_CLINIC_OR_DEPARTMENT_OTHER): Payer: 59 | Admitting: Family Medicine

## 2021-09-21 ENCOUNTER — Ambulatory Visit (HOSPITAL_COMMUNITY): Payer: 59

## 2021-09-21 ENCOUNTER — Encounter (HOSPITAL_BASED_OUTPATIENT_CLINIC_OR_DEPARTMENT_OTHER): Payer: Self-pay

## 2021-09-22 ENCOUNTER — Other Ambulatory Visit (HOSPITAL_COMMUNITY): Payer: Self-pay | Admitting: Physician Assistant

## 2021-09-22 ENCOUNTER — Other Ambulatory Visit (HOSPITAL_BASED_OUTPATIENT_CLINIC_OR_DEPARTMENT_OTHER): Payer: Self-pay

## 2021-09-22 ENCOUNTER — Telehealth (HOSPITAL_COMMUNITY): Payer: 59 | Admitting: Psychiatry

## 2021-09-22 ENCOUNTER — Other Ambulatory Visit (HOSPITAL_BASED_OUTPATIENT_CLINIC_OR_DEPARTMENT_OTHER): Payer: Self-pay | Admitting: Family Medicine

## 2021-09-22 MED ORDER — LITHIUM CARBONATE 300 MG PO CAPS
300.0000 mg | ORAL_CAPSULE | Freq: Two times a day (BID) | ORAL | 0 refills | Status: DC
  Filled 2021-09-22: qty 60, 30d supply, fill #0

## 2021-09-23 ENCOUNTER — Ambulatory Visit (HOSPITAL_COMMUNITY): Payer: 59

## 2021-09-23 ENCOUNTER — Other Ambulatory Visit (HOSPITAL_BASED_OUTPATIENT_CLINIC_OR_DEPARTMENT_OTHER): Payer: Self-pay | Admitting: Family Medicine

## 2021-09-23 ENCOUNTER — Other Ambulatory Visit (HOSPITAL_BASED_OUTPATIENT_CLINIC_OR_DEPARTMENT_OTHER): Payer: Self-pay

## 2021-09-23 MED ORDER — GABAPENTIN 300 MG PO CAPS
300.0000 mg | ORAL_CAPSULE | Freq: Two times a day (BID) | ORAL | 0 refills | Status: DC
  Filled 2021-09-23: qty 60, 30d supply, fill #0

## 2021-09-23 MED ORDER — POTASSIUM CHLORIDE CRYS ER 20 MEQ PO TBCR
40.0000 meq | EXTENDED_RELEASE_TABLET | Freq: Every day | ORAL | 3 refills | Status: DC
  Filled 2021-09-23: qty 60, 30d supply, fill #0
  Filled 2021-10-20: qty 60, 30d supply, fill #1
  Filled 2021-11-24: qty 60, 30d supply, fill #2
  Filled 2021-12-26: qty 60, 30d supply, fill #3

## 2021-09-26 ENCOUNTER — Ambulatory Visit (HOSPITAL_COMMUNITY): Payer: 59

## 2021-09-27 ENCOUNTER — Other Ambulatory Visit (HOSPITAL_BASED_OUTPATIENT_CLINIC_OR_DEPARTMENT_OTHER): Payer: Self-pay

## 2021-09-28 ENCOUNTER — Ambulatory Visit (HOSPITAL_COMMUNITY): Payer: 59

## 2021-09-29 ENCOUNTER — Ambulatory Visit (INDEPENDENT_AMBULATORY_CARE_PROVIDER_SITE_OTHER): Payer: 59 | Admitting: Family Medicine

## 2021-09-29 ENCOUNTER — Encounter (HOSPITAL_BASED_OUTPATIENT_CLINIC_OR_DEPARTMENT_OTHER): Payer: Self-pay | Admitting: Family Medicine

## 2021-09-29 VITALS — BP 163/102 | HR 99 | Ht 70.0 in | Wt 305.6 lb

## 2021-09-29 DIAGNOSIS — G4733 Obstructive sleep apnea (adult) (pediatric): Secondary | ICD-10-CM

## 2021-09-29 DIAGNOSIS — I10 Essential (primary) hypertension: Secondary | ICD-10-CM | POA: Diagnosis not present

## 2021-09-29 DIAGNOSIS — E11649 Type 2 diabetes mellitus with hypoglycemia without coma: Secondary | ICD-10-CM | POA: Diagnosis not present

## 2021-09-29 NOTE — Assessment & Plan Note (Signed)
Patient does check blood pressure intermittently at home.  Does report that readings at home have been better than what was obtained in office today, however they still do remain borderline.  Not currently having any issues with chest pain or headaches Continues to follow with cardiology, has been taking medications as prescribed.  Next follow-up with cardiology is in about 6 weeks or so Recommend continuing current regimen, recommend intermittent monitoring at home, DASH diet Recommend close follow-up with cardiology as scheduled

## 2021-09-29 NOTE — Patient Instructions (Signed)
  Medication Instructions:  Your physician recommends that you continue on your current medications as directed. Please refer to the Current Medication list given to you today. --If you need a refill on any your medications before your next appointment, please call your pharmacy first. If no refills are authorized on file call the office.-- Lab Work: Your physician has recommended that you have lab work today: No If you have labs (blood work) drawn today and your tests are completely normal, you will receive your results via MyChart message OR a phone call from our staff.  Please ensure you check your voicemail in the event that you authorized detailed messages to be left on a delegated number. If you have any lab test that is abnormal or we need to change your treatment, we will call you to review the results.  Referrals/Procedures/Imaging: No  Follow-Up: Your next appointment:   Your physician recommends that you schedule a follow-up appointment in: 2-3 months with Dr. [place].  You will receive a text message or e-mail with a link to a survey about your care and experience with us today! We would greatly appreciate your feedback!   Thanks for letting us be apart of your health journey!!  Primary Care and Sports Medicine   Dr. Raymond de [place]   We encourage you to activate your patient portal called "MyChart".  Sign up information is provided on this After Visit Summary.  MyChart is used to connect with patients for Virtual Visits (Telemedicine).  Patients are able to view lab/test results, encounter notes, upcoming appointments, etc.  Non-urgent messages can be sent to your provider as well. To learn more about what you can do with MyChart, please visit --  [email or web address].    

## 2021-09-29 NOTE — Assessment & Plan Note (Signed)
Patient was recently seen in the ED last week due to an episode of hypoglycemia.  He was transported by ambulance to the emergency department after he was having issues at home including sweats, confusion, difficulty speaking and moving.  He was found to have low blood sugar by EMS and continued to have low blood sugar readings at the hospital despite interventions.  Eventually they were able to have his blood sugars normalized and patient returned to baseline.  Labs and imaging indicated no other acute process.  He did have some leukocytosis, however did not have any signs of infection such as UTI or pneumonia. Today, patient is doing well.  He does feel that he may have accidentally taken more glipizide than prescribed.  Thinks he might of loaded his medication dispenser incorrectly.  He has resumed taking all medications as prescribed including glipizide which he was hesitant to resume at first.  He has not had any further low blood sugar readings Most recent hemoglobin A1c was at goal.  Discussed that we will need to keep a close eye on any further hypoglycemic episodes and may need to alter her current medication regimen if these recur in the future.  If it was related to medication error, then likely should not have any further issues with current regimen

## 2021-09-29 NOTE — Assessment & Plan Note (Signed)
He does have some questions about a new CPAP machine.  He does follow with sleep medicine specialist through [facility].  He does have upcoming appointment, however his insurance plan is leaving the state and he will be needing a new insurance option trying to have everything arranged prior to discontinuation of current insurance plan.  I recommended that he reach out to his sleep medicine specialist to discuss further with their office on whether the can be seen sooner or if new order for CPAP can be provided

## 2021-09-29 NOTE — Progress Notes (Signed)
    Procedures performed today:    None.  Independent interpretation of notes and tests performed by another provider:   None.  Brief History, Exam, Impression, and Recommendations:    BP (!) 163/102   Pulse 99   Ht 5\' 10"  (1.778 m)   Wt (!) 305 lb 9.6 oz (138.6 kg)   SpO2 95%   BMI 43.85 kg/m   Diabetes mellitus (HCC) Patient was recently seen in the ED last week due to an episode of hypoglycemia.  He was transported by ambulance to the emergency department after he was having issues at home including sweats, confusion, difficulty speaking and moving.  He was found to have low blood sugar by EMS and continued to have low blood sugar readings at the hospital despite interventions.  Eventually they were able to have his blood sugars normalized and patient returned to baseline.  Labs and imaging indicated no other acute process.  He did have some leukocytosis, however did not have any signs of infection such as UTI or pneumonia. Today, patient is doing well.  He does feel that he may have accidentally taken more glipizide than prescribed.  Thinks he might of loaded his medication dispenser incorrectly.  He has resumed taking all medications as prescribed including glipizide which he was hesitant to resume at first.  He has not had any further low blood sugar readings Most recent hemoglobin A1c was at goal.  Discussed that we will need to keep a close eye on any further hypoglycemic episodes and may need to alter her current medication regimen if these recur in the future.  If it was related to medication error, then likely should not have any further issues with current regimen  Sleep apnea He does have some questions about a new CPAP machine.  He does follow with sleep medicine specialist through [facility].  He does have upcoming appointment, however his insurance plan is leaving the state and he will be needing a new insurance option trying to have everything arranged prior to discontinuation  of current insurance plan.  I recommended that he reach out to his sleep medicine specialist to discuss further with their office on whether the can be seen sooner or if new order for CPAP can be provided  Hypertension Patient does check blood pressure intermittently at home.  Does report that readings at home have been better than what was obtained in office today, however they still do remain borderline.  Not currently having any issues with chest pain or headaches Continues to follow with cardiology, has been taking medications as prescribed.  Next follow-up with cardiology is in about 6 weeks or so Recommend continuing current regimen, recommend intermittent monitoring at home, DASH diet Recommend close follow-up with cardiology as scheduled  Return in about 3 months (around 12/30/2021) for DM, HTN.   ___________________________________________ Lenon Kuennen de 01/01/2022, MD, ABFM, [facility] Primary Care and Sports Medicine [facility]

## 2021-09-30 ENCOUNTER — Ambulatory Visit (HOSPITAL_COMMUNITY): Payer: 59

## 2021-10-03 ENCOUNTER — Ambulatory Visit (HOSPITAL_COMMUNITY): Payer: 59

## 2021-10-03 ENCOUNTER — Other Ambulatory Visit (HOSPITAL_BASED_OUTPATIENT_CLINIC_OR_DEPARTMENT_OTHER): Payer: Self-pay

## 2021-10-05 ENCOUNTER — Ambulatory Visit: Payer: Self-pay

## 2021-10-05 ENCOUNTER — Ambulatory Visit (HOSPITAL_COMMUNITY): Payer: 59

## 2021-10-05 ENCOUNTER — Ambulatory Visit: Payer: 59 | Admitting: Physical Medicine and Rehabilitation

## 2021-10-05 ENCOUNTER — Encounter: Payer: Self-pay | Admitting: Physical Medicine and Rehabilitation

## 2021-10-05 VITALS — BP 133/95 | HR 92

## 2021-10-05 DIAGNOSIS — M5416 Radiculopathy, lumbar region: Secondary | ICD-10-CM | POA: Diagnosis not present

## 2021-10-05 MED ORDER — METHYLPREDNISOLONE ACETATE 80 MG/ML IJ SUSP
80.0000 mg | Freq: Once | INTRAMUSCULAR | Status: AC
  Administered 2021-10-05: 80 mg

## 2021-10-05 NOTE — Progress Notes (Signed)
Pt state lower back pain that travels dow to his right leg. Pt state walking and standing makes the pain worse. Pt state he takes pain meds to help ease his pain. Pt has hx of inj on 07/07/21 pt stae it did help.  Numeric Pain Rating Scale and Functional Assessment Average Pain 6   In the last MONTH (on 0-10 scale) has pain interfered with the following?  1. General activity like being  able to carry out your everyday physical activities such as walking, climbing stairs, carrying groceries, or moving a chair?  Rating(10)   +Driver, -BT, -Dye Allergies.

## 2021-10-05 NOTE — Patient Instructions (Signed)

## 2021-10-07 ENCOUNTER — Ambulatory Visit (HOSPITAL_COMMUNITY): Payer: 59

## 2021-10-08 ENCOUNTER — Other Ambulatory Visit (HOSPITAL_BASED_OUTPATIENT_CLINIC_OR_DEPARTMENT_OTHER): Payer: Self-pay | Admitting: Family Medicine

## 2021-10-10 ENCOUNTER — Ambulatory Visit (HOSPITAL_COMMUNITY): Payer: 59

## 2021-10-10 ENCOUNTER — Ambulatory Visit (HOSPITAL_BASED_OUTPATIENT_CLINIC_OR_DEPARTMENT_OTHER): Payer: 59 | Admitting: Family Medicine

## 2021-10-10 ENCOUNTER — Other Ambulatory Visit (HOSPITAL_BASED_OUTPATIENT_CLINIC_OR_DEPARTMENT_OTHER): Payer: Self-pay

## 2021-10-10 MED ORDER — FLUOXETINE HCL 20 MG PO CAPS
20.0000 mg | ORAL_CAPSULE | Freq: Every day | ORAL | 1 refills | Status: DC
  Filled 2021-10-10: qty 30, 30d supply, fill #0
  Filled 2021-11-09: qty 30, 30d supply, fill #1

## 2021-10-10 MED ORDER — BUPROPION HCL ER (SR) 150 MG PO TB12
150.0000 mg | ORAL_TABLET | Freq: Two times a day (BID) | ORAL | 1 refills | Status: DC
  Filled 2021-10-10: qty 60, 30d supply, fill #0
  Filled 2021-11-09: qty 60, 30d supply, fill #1

## 2021-10-10 NOTE — Procedures (Signed)
Lumbar Epidural Steroid Injection - Interlaminar Approach with Fluoroscopic Guidance  Patient: Jordan Sparks      Date of Birth: 20-Feb-1976 MRN: [id number] PCP: de [place], Raymond J, MD      Visit Date: 10/05/2021   Universal Protocol:     Consent Given By: the patient  Position: PRONE  Additional Comments: Vital signs were monitored before and after the procedure. Patient was prepped and draped in the usual sterile fashion. The correct patient, procedure, and site was verified.   Injection Procedure Details:   Procedure diagnoses: Lumbar radiculopathy [M54.16]   Meds Administered:  Meds ordered this encounter  Medications   methylPREDNISolone acetate (DEPO-MEDROL) injection 80 mg     Laterality: Right  Location/Site:  L5-S1  Needle: 4.5 in., 20 ga. Tuohy  Needle Placement: Paramedian epidural  Findings:   -Comments: Excellent flow of contrast into the epidural space.  Procedure Details: Using a paramedian approach from the side mentioned above, the region overlying the inferior lamina was localized under fluoroscopic visualization and the soft tissues overlying this structure were infiltrated with 4 ml. of 1% Lidocaine without Epinephrine. The Tuohy needle was inserted into the epidural space using a paramedian approach.   The epidural space was localized using loss of resistance along with counter oblique bi-planar fluoroscopic views.  After negative aspirate for air, blood, and CSF, a 2 ml. volume of Isovue-250 was injected into the epidural space and the flow of contrast was observed. Radiographs were obtained for documentation purposes.    The injectate was administered into the level noted above.   Additional Comments:  The patient tolerated the procedure well Dressing: 2 x 2 sterile gauze and Band-Aid    Post-procedure details: Patient was observed during the procedure. Post-procedure instructions were reviewed.  Patient left the clinic in stable  condition.

## 2021-10-10 NOTE — Progress Notes (Signed)
Jordan Sparks - 46 y.o. male MRN [id number]  Date of birth: 08-30-1975  Office Visit Note: Visit Date: 10/05/2021 PCP: Tommi Rumps [place], Buren Kos, MD Referred by: de [place], Raymond J, MD  Subjective: Chief Complaint  Patient presents with   Lower Back - Pain   Right Leg - Pain   HPI:  Jordan Sparks is a 46 y.o. male who comes in today for planned repeat Right L5-S1  Lumbar Interlaminar epidural steroid injection with fluoroscopic guidance.  The patient has failed conservative care including home exercise, medications, time and activity modification.  This injection will be diagnostic and hopefully therapeutic.  Please see requesting physician notes for further details and justification. Patient received more than 50% pain relief from prior injection.   Referring: Dr. Norlene Campbell   ROS Otherwise per HPI.  Assessment & Plan: Visit Diagnoses:    ICD-10-CM   1. Lumbar radiculopathy  M54.16 XR C-ARM NO REPORT    Epidural Steroid injection    methylPREDNISolone acetate (DEPO-MEDROL) injection 80 mg      Plan: No additional findings.   Meds & Orders:  Meds ordered this encounter  Medications   methylPREDNISolone acetate (DEPO-MEDROL) injection 80 mg    Orders Placed This Encounter  Procedures   XR C-ARM NO REPORT   Epidural Steroid injection    Follow-up: No follow-ups on file.   Procedures: No procedures performed  Lumbar Epidural Steroid Injection - Interlaminar Approach with Fluoroscopic Guidance  Patient: Jordan Sparks      Date of Birth: August 15, 1975 MRN: [id number] PCP: de [place], Raymond J, MD      Visit Date: 10/05/2021   Universal Protocol:     Consent Given By: the patient  Position: PRONE  Additional Comments: Vital signs were monitored before and after the procedure. Patient was prepped and draped in the usual sterile fashion. The correct patient, procedure, and site was verified.   Injection Procedure Details:   Procedure  diagnoses: Lumbar radiculopathy [M54.16]   Meds Administered:  Meds ordered this encounter  Medications   methylPREDNISolone acetate (DEPO-MEDROL) injection 80 mg     Laterality: Right  Location/Site:  L5-S1  Needle: 4.5 in., 20 ga. Tuohy  Needle Placement: Paramedian epidural  Findings:   -Comments: Excellent flow of contrast into the epidural space.  Procedure Details: Using a paramedian approach from the side mentioned above, the region overlying the inferior lamina was localized under fluoroscopic visualization and the soft tissues overlying this structure were infiltrated with 4 ml. of 1% Lidocaine without Epinephrine. The Tuohy needle was inserted into the epidural space using a paramedian approach.   The epidural space was localized using loss of resistance along with counter oblique bi-planar fluoroscopic views.  After negative aspirate for air, blood, and CSF, a 2 ml. volume of Isovue-250 was injected into the epidural space and the flow of contrast was observed. Radiographs were obtained for documentation purposes.    The injectate was administered into the level noted above.   Additional Comments:  The patient tolerated the procedure well Dressing: 2 x 2 sterile gauze and Band-Aid    Post-procedure details: Patient was observed during the procedure. Post-procedure instructions were reviewed.  Patient left the clinic in stable condition.   Clinical History: MRI LUMBAR SPINE WITHOUT CONTRAST   TECHNIQUE: Multiplanar, multisequence MR imaging of the lumbar spine was performed. No intravenous contrast was administered.   COMPARISON:  Lumbar spine radiographs 04/14/2021. CTA chest, abdomen, and pelvis 06/18/2020.   FINDINGS: The study is motion degraded,  moderately so on the axial sequences.   Segmentation: Standard.   Alignment: Trace retrolisthesis of L4 on L5. Straightening of the normal lumbar lordosis.   Vertebrae: No fracture, suspicious marrow  lesion, or significant marrow edema. Mild chronic degenerative endplate changes at L5-S1.   Conus medullaris and cauda equina: Conus extends to the L1-2 level. Conus and cauda equina appear normal.   Paraspinal and other soft tissues: Unremarkable.   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   T12-L1 and L1-2: Negative.   L2-3: Minimal disc bulging without stenosis.   L3-4: Mild disc bulging without significant stenosis.   L4-5: Mild disc desiccation. Circumferential disc bulging and mild facet hypertrophy result in mild spinal stenosis, mild bilateral lateral recess stenosis, and mild-to-moderate bilateral neural foraminal stenosis.   L5-S1: Disc desiccation. Disc bulging and mild facet hypertrophy result in mild right and moderate left neural foraminal stenosis without spinal stenosis.   IMPRESSION: 1. Motion degraded examination. 2. 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     Electronically Signed   By: Sebastian Ache M.D.   On: 05/16/2021 18:00     Objective:  VS:  HT:    WT:   BMI:     BP:(!) 133/95  HR:92bpm  TEMP: ( )  RESP:  Physical Exam Vitals and nursing note reviewed.  Constitutional:      General: He is not in acute distress.    Appearance: Normal appearance. He is not ill-appearing.  HENT:     Head: Normocephalic and atraumatic.     Right Ear: External ear normal.     Left Ear: External ear normal.     Nose: No congestion.  Eyes:     Extraocular Movements: Extraocular movements intact.  Cardiovascular:     Rate and Rhythm: Normal rate.     Pulses: Normal pulses.  Pulmonary:     Effort: Pulmonary effort is normal. No respiratory distress.  Abdominal:     General: There is no distension.     Palpations: Abdomen is soft.  Musculoskeletal:         General: No tenderness or signs of injury.     Cervical back: Neck supple.     Right lower leg: No edema.     Left lower leg: No edema.     Comments: Patient has good distal strength without clonus.  Skin:    Findings: No erythema or rash.  Neurological:     General: No focal deficit present.     Mental Status: He is alert and oriented to person, place, and time.     Sensory: No sensory deficit.     Motor: No weakness or abnormal muscle tone.     Coordination: Coordination normal.  Psychiatric:        Mood and Affect: Mood normal.        Behavior: Behavior normal.      Imaging: No results found.

## 2021-10-11 ENCOUNTER — Ambulatory Visit (INDEPENDENT_AMBULATORY_CARE_PROVIDER_SITE_OTHER): Payer: 59 | Admitting: Orthopaedic Surgery

## 2021-10-11 ENCOUNTER — Encounter: Payer: Self-pay | Admitting: Orthopaedic Surgery

## 2021-10-11 ENCOUNTER — Other Ambulatory Visit (HOSPITAL_BASED_OUTPATIENT_CLINIC_OR_DEPARTMENT_OTHER): Payer: Self-pay

## 2021-10-11 DIAGNOSIS — G629 Polyneuropathy, unspecified: Secondary | ICD-10-CM | POA: Insufficient documentation

## 2021-10-11 DIAGNOSIS — M79604 Pain in right leg: Secondary | ICD-10-CM

## 2021-10-11 DIAGNOSIS — M545 Low back pain, unspecified: Secondary | ICD-10-CM | POA: Insufficient documentation

## 2021-10-11 DIAGNOSIS — M5440 Lumbago with sciatica, unspecified side: Secondary | ICD-10-CM

## 2021-10-11 NOTE — Progress Notes (Signed)
Office Visit Note   Patient: Jordan Sparks           Date of Birth: 23-Sep-1975           MRN: [id number] Visit Date: 10/11/2021              Requested by: de [place], Raymond J, MD [address] [place],  [place] [address] PCP: de [place], Raymond J, MD  Chief Complaint  Patient presents with   Lower Back - Follow-up      HPI: Patient presents today for follow-up on his low back pain radiating down his right leg.  He underwent a second epidural steroid injection and L5-S1 5 days ago with Dr. Alvester Morin.  He had previously had 1 before that a few months prior.  But he said the injection helped with some of his pain it did not make him comfortable.  He still states if he cannot stand for a long time.  If he goes for a walk he cannot go very far because for he just feels fatigued.  Also talks about his right foot still feels cold.  He still has nerve feelings in the legs.  He also gets "tickling "in his right foot.  He is a type II diabetic.  While he has had poor control in the past he is fairly well controlled now.  He also complains of some right-sided lower abdomen pain but denies any nausea or vomiting.  Assessment & Plan: Visit Diagnoses:  1. Pain in right leg   2. Acute right-sided low back pain with sciatica, sciatica laterality unspecified     Plan: Pleasant 46 year old gentleman with recent L5-S1 epidural steroid injection.  While this relieves some of his pain he still has neuropathic findings down his right leg all the way to his foot.  This includes tickling feeling in his right foot as well as cold feeling in his right foot.  Certainly given his symptoms of weakness with prolonged weightbearing this could be consistent with some stenosis.  Physical exam reveals that he has bounding pulses in his foot is warm.  He does not have any symptoms in his left foot.  Given his history of diabetes have suggested that we go forward with some electrodiagnostic studies of the right lower  extremity.  He also has carpal tunnel symptoms in the right arm but he does not want to do anything about this so we will defer testing for now.  We also recommend referral to physical therapy and he is willing to do this.  He will follow-up once the electrodiagnostic studies of the right lower extremity are complete.  However recommended he follow-up with some of his abdominal pain with his primary care physician.  He is not acutely ill this has been going on for a while.  Denies any nausea related to this fever or chills. Also having some pain in the right lower quadrant of his abdomen and I did asked him to have his family physician checked that  Follow-Up Instructions: After electrodiagnostic studies completed  Ortho Exam  Patient is alert, oriented, no adenopathy, well-dressed, normal affect, normal respiratory effort. Examination of the right lower extremity he has fairly good strength.  His foot though feels cold to him is warm all the way to his toes.  He has bounding dorsalis pedis posterior tibial pulses.  Compartments of his lower leg are soft and nontender.  He has good flexion and extension of his legs.  No pain in the groin or  in the hip joint.   Imaging: No results found. No images are attached to the encounter.  Labs: Lab Results  Component Value Date   HGBA1C 7.0 (H) 08/29/2021   HGBA1C 7.7 (H) 05/04/2021   HGBA1C 10.6 (H) 02/09/2018   REPTSTATUS 02/13/2018 FINAL 02/08/2018   CULT  02/08/2018    NO GROWTH 5 DAYS Performed at [facility], [place] [address] [place] [address]      Lab Results  Component Value Date   ALBUMIN 4.1 09/20/2021   ALBUMIN 4.0 09/09/2021   ALBUMIN 4.7 05/04/2021    Lab Results  Component Value Date   MG 2.1 06/02/2021   MG 1.9 02/09/2018   No results found for: "VD25OH"  No results found for: "PREALBUMIN"    Latest Ref Rng & Units 09/20/2021    4:20 PM 06/03/2021    4:18 AM 06/01/2021    3:39 AM  CBC EXTENDED  WBC  4.0 - 10.5 K/uL 17.8  10.9  8.4   RBC 4.22 - 5.81 MIL/uL 5.35  5.75  5.10   Hemoglobin 13.0 - 17.0 g/dL 14.4  15.1  13.5   HCT 39.0 - 52.0 % 43.2  46.1  40.4   Platelets 150 - 400 K/uL 341  251  237   NEUT# 1.7 - 7.7 K/uL 13.7     Lymph# 0.7 - 4.0 K/uL 2.6        There is no height or weight on file to calculate BMI.  Orders:  Orders Placed This Encounter  Procedures   Ambulatory referral to Physical Medicine Rehab   Ambulatory referral to Physical Therapy   No orders of the defined types were placed in this encounter.    Procedures: No procedures performed  Clinical Data: No additional findings.  ROS:  All other systems negative, except as noted in the HPI. Review of Systems  Objective: Vital Signs: There were no vitals taken for this visit.  Specialty Comments:  MRI LUMBAR SPINE WITHOUT CONTRAST   TECHNIQUE: Multiplanar, multisequence MR imaging of the lumbar spine was performed. No intravenous contrast was administered.   COMPARISON:  Lumbar spine radiographs 04/14/2021. CTA chest, abdomen, and pelvis 06/18/2020.   FINDINGS: The study is motion degraded, moderately so on the axial sequences.   Segmentation: Standard.   Alignment: Trace retrolisthesis of L4 on L5. Straightening of the normal lumbar lordosis.   Vertebrae: No fracture, suspicious marrow lesion, or significant marrow edema. Mild chronic degenerative endplate changes at 075-GRM.   Conus medullaris and cauda equina: Conus extends to the L1-2 level. Conus and cauda equina appear normal.   Paraspinal and other soft tissues: Unremarkable.   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   T12-L1 and L1-2: Negative.   L2-3: Minimal disc bulging without stenosis.   L3-4: Mild disc bulging without significant stenosis.   L4-5: Mild disc desiccation. Circumferential disc bulging and mild facet hypertrophy result in mild spinal stenosis, mild  bilateral lateral recess stenosis, and mild-to-moderate bilateral neural foraminal stenosis.   L5-S1: Disc desiccation. Disc bulging and mild facet hypertrophy result in mild right and moderate left neural foraminal stenosis without spinal stenosis.   IMPRESSION: 1. Motion degraded examination. 2. 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     Electronically Signed   By: Seymour Bars.D.  On: 05/16/2021 18:00  PMFS History: Patient Active Problem List   Diagnosis Date Noted   Low back pain 10/11/2021   Peripheral neuropathy 10/11/2021   Pain in left shoulder 08/31/2021   Spinal stenosis of cervical region 06/09/2021   Nonischemic cardiomyopathy (HCC) 06/06/2021   Chest pain 06/01/2021   Chest pain of uncertain etiology 05/31/2021   Obesity, Class III, BMI 40-49.9 (morbid obesity) (HCC) 05/31/2021   Diabetes mellitus (HCC) 05/04/2021   Sleep apnea 05/04/2021   Hypertensive urgency 10/19/2020   GERD (gastroesophageal reflux disease) 02/09/2018   Hyperglycemia 02/08/2018   Atypical chest pain 02/08/2018   Hypokalemia 02/08/2018   Leukocytosis 02/08/2018   Nausea and vomiting 02/08/2018   Mood disorder (HCC)    Hypertension    Anxiety    Hyperglycemia without ketosis    Manic disorder (HCC)    Past Medical History:  Diagnosis Date   Anxiety    Depression    Diabetes mellitus type 2 in obese (HCC)    Hypertension    Manic disorder (HCC)    Obesity     Family History  Problem Relation Age of Onset   Seizures Brother    Heart failure Neg Hx     Past Surgical History:  Procedure Laterality Date   Dental procedure     Social History   Occupational History   Occupation: truck Hospital doctor    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, [employer]  Tobacco Use   Smoking status: Former    Packs/day: 0.25    Years: 32.00    Total  pack years: 8.00    Types: Cigarettes    Quit date: 04/2021    Years since quitting: 0.4   Smokeless tobacco: Never  Vaping Use   Vaping Use: Never used  Substance and Sexual Activity   Alcohol use: Yes    Comment: occasional   Drug use: Yes    Types: Marijuana    Comment: routinely   Sexual activity: Not on file

## 2021-10-12 ENCOUNTER — Ambulatory Visit (HOSPITAL_COMMUNITY): Payer: 59

## 2021-10-13 ENCOUNTER — Ambulatory Visit (INDEPENDENT_AMBULATORY_CARE_PROVIDER_SITE_OTHER): Payer: Commercial Managed Care - HMO | Admitting: Physical Therapy

## 2021-10-13 ENCOUNTER — Other Ambulatory Visit: Payer: Self-pay

## 2021-10-13 ENCOUNTER — Encounter: Payer: Self-pay | Admitting: Physical Therapy

## 2021-10-13 DIAGNOSIS — M6281 Muscle weakness (generalized): Secondary | ICD-10-CM

## 2021-10-13 DIAGNOSIS — M5459 Other low back pain: Secondary | ICD-10-CM

## 2021-10-13 DIAGNOSIS — R262 Difficulty in walking, not elsewhere classified: Secondary | ICD-10-CM
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
## 2021-10-14 ENCOUNTER — Ambulatory Visit (HOSPITAL_COMMUNITY): Payer: 59

## 2021-10-17 ENCOUNTER — Ambulatory Visit (HOSPITAL_COMMUNITY): Payer: 59

## 2021-10-19 ENCOUNTER — Telehealth: Payer: Self-pay | Admitting: Physical Medicine and Rehabilitation

## 2021-10-19 ENCOUNTER — Ambulatory Visit (HOSPITAL_COMMUNITY): Payer: 59

## 2021-10-19 NOTE — Telephone Encounter (Signed)
Patient states that the injection did not help and is requesting a call back    [phone]

## 2021-10-20 ENCOUNTER — Other Ambulatory Visit (HOSPITAL_BASED_OUTPATIENT_CLINIC_OR_DEPARTMENT_OTHER): Payer: Self-pay | Admitting: Family Medicine

## 2021-10-20 ENCOUNTER — Other Ambulatory Visit (HOSPITAL_BASED_OUTPATIENT_CLINIC_OR_DEPARTMENT_OTHER): Payer: Self-pay

## 2021-10-20 MED ORDER — ACETAMINOPHEN 500 MG PO TABS
1000.0000 mg | ORAL_TABLET | Freq: Four times a day (QID) | ORAL | 1 refills | Status: DC | PRN
  Filled 2021-10-20: qty 30, 4d supply, fill #0
  Filled 2021-11-24: qty 30, 4d supply, fill #1

## 2021-10-21 ENCOUNTER — Ambulatory Visit (HOSPITAL_COMMUNITY): Payer: 59

## 2021-10-24 ENCOUNTER — Ambulatory Visit (HOSPITAL_COMMUNITY): Payer: 59

## 2021-10-24 ENCOUNTER — Other Ambulatory Visit (HOSPITAL_BASED_OUTPATIENT_CLINIC_OR_DEPARTMENT_OTHER): Payer: Self-pay

## 2021-10-24 ENCOUNTER — Other Ambulatory Visit (HOSPITAL_BASED_OUTPATIENT_CLINIC_OR_DEPARTMENT_OTHER): Payer: Self-pay | Admitting: Family Medicine

## 2021-10-24 DIAGNOSIS — I10 Essential (primary) hypertension: Secondary | ICD-10-CM

## 2021-10-24 MED ORDER — AMLODIPINE BESYLATE 5 MG PO TABS
5.0000 mg | ORAL_TABLET | Freq: Every day | ORAL | 1 refills | Status: DC
  Filled 2021-10-24: qty 30, 30d supply, fill #0
  Filled 2021-11-24: qty 30, 30d supply, fill #1

## 2021-10-24 MED ORDER — LITHIUM CARBONATE 300 MG PO CAPS
300.0000 mg | ORAL_CAPSULE | Freq: Two times a day (BID) | ORAL | 0 refills | Status: DC
  Filled 2021-10-24: qty 60, 30d supply, fill #0

## 2021-10-24 MED ORDER — GABAPENTIN 300 MG PO CAPS
300.0000 mg | ORAL_CAPSULE | Freq: Two times a day (BID) | ORAL | 0 refills | Status: DC
  Filled 2021-10-24: qty 60, 30d supply, fill #0

## 2021-10-26 ENCOUNTER — Ambulatory Visit (HOSPITAL_COMMUNITY): Payer: 59

## 2021-10-27 ENCOUNTER — Encounter: Payer: 59 | Admitting: Physical Therapy

## 2021-10-28 ENCOUNTER — Other Ambulatory Visit (HOSPITAL_BASED_OUTPATIENT_CLINIC_OR_DEPARTMENT_OTHER): Payer: Self-pay

## 2021-10-28 ENCOUNTER — Ambulatory Visit (HOSPITAL_COMMUNITY): Payer: 59

## 2021-10-28 ENCOUNTER — Other Ambulatory Visit (HOSPITAL_BASED_OUTPATIENT_CLINIC_OR_DEPARTMENT_OTHER): Payer: Self-pay | Admitting: Family Medicine

## 2021-10-31 ENCOUNTER — Other Ambulatory Visit (HOSPITAL_BASED_OUTPATIENT_CLINIC_OR_DEPARTMENT_OTHER): Payer: Self-pay

## 2021-10-31 MED ORDER — METFORMIN HCL 1000 MG PO TABS
1000.0000 mg | ORAL_TABLET | Freq: Two times a day (BID) | ORAL | 1 refills | Status: DC
  Filled 2021-10-31: qty 160, 80d supply, fill #0
  Filled 2021-10-31: qty 20, 10d supply, fill #0
  Filled 2022-01-25: qty 180, 90d supply, fill #1

## 2021-10-31 MED ORDER — FLUTICASONE PROPIONATE 50 MCG/ACT NA SUSP
2.0000 | Freq: Every day | NASAL | 1 refills | Status: DC | PRN
  Filled 2021-10-31: qty 16, 30d supply, fill #0
  Filled 2021-11-24: qty 16, 30d supply, fill #1

## 2021-11-01 ENCOUNTER — Ambulatory Visit (INDEPENDENT_AMBULATORY_CARE_PROVIDER_SITE_OTHER): Payer: Commercial Managed Care - HMO | Admitting: Physical Therapy

## 2021-11-01 ENCOUNTER — Encounter: Payer: Self-pay | Admitting: Physical Therapy

## 2021-11-01 DIAGNOSIS — M5459 Other low back pain: Secondary | ICD-10-CM

## 2021-11-01 DIAGNOSIS — M6281 Muscle weakness (generalized): Secondary | ICD-10-CM

## 2021-11-01 DIAGNOSIS — R262 Difficulty in walking, not elsewhere classified: Secondary | ICD-10-CM | POA: Diagnosis not present
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
## 2021-11-02 ENCOUNTER — Ambulatory Visit (INDEPENDENT_AMBULATORY_CARE_PROVIDER_SITE_OTHER): Payer: 59 | Admitting: Physical Medicine and Rehabilitation

## 2021-11-02 ENCOUNTER — Encounter: Payer: Self-pay | Admitting: Physical Medicine and Rehabilitation

## 2021-11-02 DIAGNOSIS — R202 Paresthesia of skin: Secondary | ICD-10-CM | POA: Diagnosis not present

## 2021-11-02 NOTE — Progress Notes (Unsigned)
Jordan Sparks - 46 y.o. male MRN [id number]  Date of birth: Jan 30, 1976  Office Visit Note: Visit Date: 11/02/2021 PCP: de [place], Raymond J, MD Referred by: Valeria Batman, MD  Subjective: Chief Complaint  Patient presents with   Right Hip - Pain   Right Leg - Numbness, Pain   Right Foot - Pain, Numbness   HPI:  Jordan Sparks is a 46 y.o. male who comes in today at the request of Dr. Norlene Campbell for electrodiagnostic study of the Right lower extremity   ROS Otherwise per HPI.  Assessment & Plan: Visit Diagnoses:    ICD-10-CM   1. Paresthesia of skin  R20.2 NCV with EMG (electromyography)      Plan: Impression: Essentially NORMAL electrodiagnostic study of the right lower limb.  There is no significant electrodiagnostic evidence of nerve entrapment, lumbosacral plexopathy or lumbar radiculopathy.    As you know, purely sensory or demyelinating radiculopathies and chemical radiculitis may not be detected with this particular electrodiagnostic study.  Recommendations: 1.  Follow-up with referring physician. 2.  Continue current management of symptoms.  Meds & Orders: No orders of the defined types were placed in this encounter.   Orders Placed This Encounter  Procedures   NCV with EMG (electromyography)    Follow-up: Return in about 2 weeks (around 11/16/2021) for Norlene Campbell, MD.   Procedures: No procedures performed  EMG & NCV Findings: Evaluation of the right tibial motor nerve showed reduced amplitude (1.3 mV).  All remaining nerves (as indicated in the following tables) were within normal limits.    All examined muscles (as indicated in the following table) showed no evidence of electrical instability.    Impression: Essentially NORMAL electrodiagnostic study of the right lower limb.  There is no significant electrodiagnostic evidence of nerve entrapment, lumbosacral plexopathy or lumbar radiculopathy.    As you know, purely sensory or  demyelinating radiculopathies and chemical radiculitis may not be detected with this particular electrodiagnostic study.  Recommendations: 1.  Follow-up with referring physician. 2.  Continue current management of symptoms.  ___________________________ Naaman Plummer FAAPMR Board Certified, American Board of Physical Medicine and Rehabilitation    Nerve Conduction Studies Anti Sensory Summary Table   Stim Site NR Peak (ms) Norm Peak (ms) P-T Amp (V) Norm P-T Amp Site1 Site2 Delta-P (ms) Dist (cm) Vel (m/s) Norm Vel (m/s)  Right Saphenous Anti Sensory (Ant Med Mall)  28.7C  14cm    3.9 <4.4 9.5 >2 14cm Ant Med Mall 3.9 0.0  >32  Right Sup Fibular Anti Sensory (Ant Lat Mall)  28.7C  14 cm    3.6 <4.4 13.9 >5.0 14 cm Ant Lat Mall 3.6 14.0 39 >32  Right Sural Anti Sensory (Lat Mall)  28.4C  Calf    3.9 <4.0 9.4 >5.0 Calf Lat Mall 3.9 14.0 36 >35   Motor Summary Table   Stim Site NR Onset (ms) Norm Onset (ms) O-P Amp (mV) Norm O-P Amp Site1 Site2 Delta-0 (ms) Dist (cm) Vel (m/s) Norm Vel (m/s)  Right Fibular Motor (Ext Dig Brev)  28.5C  Ankle    4.7 <6.1 4.8 >2.5 B Fib Ankle 7.9 33.5 42 >38  B Fib    12.6  4.7  Poplt B Fib 1.9 10.0 53 >40  Poplt    14.5  4.2         Right Tibial Motor (Abd Hall Brev)  29C  Ankle    5.6 <6.1 *1.3 >3.0 Knee Ankle 7.7 43.0 56 >35  Knee    13.3  3.3          EMG   Side Muscle Nerve Root Ins Act Fibs Psw Amp Dur Poly Recrt Int Dennie Bible Comment  Right AntTibialis Dp Br Peron L4-5 Nml Nml Nml Nml Nml 0 Nml Nml   Right Fibularis Longus  Sup Br Peron L5-S1 Nml Nml Nml Nml Nml 0 Nml Nml   Right MedGastroc Tibial S1-2 Nml Nml Nml Nml Nml 0 Nml Nml   Right VastusMed Femoral L2-4 Nml Nml Nml Nml Nml 0 Nml Nml   Right BicepsFemS Sciatic L5-S1 Nml Nml Nml Nml Nml 0 Nml Nml     Nerve Conduction Studies Anti Sensory Left/Right Comparison   Stim Site L Lat (ms) R Lat (ms) L-R Lat (ms) L Amp (V) R Amp (V) L-R Amp (%) Site1 Site2 L Vel (m/s) R Vel (m/s) L-R Vel  (m/s)  Saphenous Anti Sensory (Ant Med Mall)  28.7C  14cm  3.9   9.5  14cm Ant Med Mall     Sup Fibular Anti Sensory (Ant Lat Mall)  28.7C  14 cm  3.6   13.9  14 cm Ant Lat Mall  39   Sural Anti Sensory (Lat Mall)  28.4C  Calf  3.9   9.4  Calf Lat Mall  36    Motor Left/Right Comparison   Stim Site L Lat (ms) R Lat (ms) L-R Lat (ms) L Amp (mV) R Amp (mV) L-R Amp (%) Site1 Site2 L Vel (m/s) R Vel (m/s) L-R Vel (m/s)  Fibular Motor (Ext Dig Brev)  28.5C  Ankle  4.7   4.8  B Fib Ankle  42   B Fib  12.6   4.7  Poplt B Fib  53   Poplt  14.5   4.2        Tibial Motor (Abd Hall Brev)  29C  Ankle  5.6   *1.3  Knee Ankle  56   Knee  13.3   3.3           Waveforms:             Clinical History: MRI LUMBAR SPINE WITHOUT CONTRAST   TECHNIQUE: Multiplanar, multisequence MR imaging of the lumbar spine was performed. No intravenous contrast was administered.   COMPARISON:  Lumbar spine radiographs 04/14/2021. CTA chest, abdomen, and pelvis 06/18/2020.   FINDINGS: The study is motion degraded, moderately so on the axial sequences.   Segmentation: Standard.   Alignment: Trace retrolisthesis of L4 on L5. Straightening of the normal lumbar lordosis.   Vertebrae: No fracture, suspicious marrow lesion, or significant marrow edema. Mild chronic degenerative endplate changes at L5-S1.   Conus medullaris and cauda equina: Conus extends to the L1-2 level. Conus and cauda equina appear normal.   Paraspinal and other soft tissues: Unremarkable.   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   T12-L1 and L1-2: Negative.   L2-3: Minimal disc bulging without stenosis.   L3-4: Mild disc bulging without significant stenosis.   L4-5: Mild disc desiccation. Circumferential disc bulging and mild facet hypertrophy result in mild spinal stenosis, mild bilateral lateral recess stenosis, and mild-to-moderate bilateral neural foraminal  stenosis.   L5-S1: Disc desiccation. Disc bulging and mild facet hypertrophy result in mild right and moderate left neural foraminal stenosis without spinal stenosis.   IMPRESSION: 1. Motion degraded examination. 2. 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     Electronically Signed   By: Sebastian Ache M.D.   On: 05/16/2021 18:00     Objective:  VS:  HT:    WT:   BMI:     BP:   HR: bpm  TEMP: ( )  RESP:  Physical Exam Vitals and nursing note reviewed.  Constitutional:      General: He is not in acute distress.    Appearance: Normal appearance. He is not ill-appearing.  HENT:     Head: Normocephalic and atraumatic.     Right Ear: External ear normal.     Left Ear: External ear normal.     Nose: No congestion.  Eyes:     Extraocular Movements: Extraocular movements intact.  Cardiovascular:     Rate and Rhythm: Normal rate.     Pulses: Normal pulses.  Pulmonary:     Effort: Pulmonary effort is normal. No respiratory distress.  Abdominal:     General: There is no distension.     Palpations: Abdomen is soft.  Musculoskeletal:        General: No tenderness or signs of injury.     Cervical back: Neck supple.     Right lower leg: No edema.     Left lower leg: No edema.     Comments: Patient has good distal strength without clonus.  Skin:    Findings: No erythema or rash.  Neurological:     General: No focal deficit present.     Mental Status: He is alert and oriented to person, place, and time.     Sensory: No sensory deficit.     Motor: No weakness or abnormal muscle tone.     Coordination: Coordination normal.  Psychiatric:        Mood and Affect: Mood normal.        Behavior: Behavior normal.      Imaging: No results found.

## 2021-11-02 NOTE — Progress Notes (Signed)
HBA1C 7.0 to 7.7 Pt state right leg pain and numbness. Pt state he has pain in his lower back and hips but the numbness to his leg and foot cause a lot of pain. Pt state his feet has been felling cold for the pass eight months.  Numeric Pain Rating Scale and Functional Assessment Average Pain 9   In the last MONTH (on 0-10 scale) has pain interfered with the following?  1. General activity like being  able to carry out your everyday physical activities such as walking, climbing stairs, carrying groceries, or moving a chair?  Rating(10)    -BT,

## 2021-11-03 ENCOUNTER — Ambulatory Visit (INDEPENDENT_AMBULATORY_CARE_PROVIDER_SITE_OTHER): Payer: Commercial Managed Care - HMO | Admitting: Physical Therapy

## 2021-11-03 DIAGNOSIS — M6281 Muscle weakness (generalized): Secondary | ICD-10-CM

## 2021-11-03 DIAGNOSIS — M5459 Other low back pain: Secondary | ICD-10-CM

## 2021-11-03 DIAGNOSIS — R262 Difficulty in walking, not elsewhere classified: Secondary | ICD-10-CM | POA: Diagnosis not present
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
## 2021-11-07 ENCOUNTER — Encounter: Payer: 59 | Admitting: Registered"

## 2021-11-07 NOTE — Procedures (Unsigned)
EMG & NCV Findings: Evaluation of the right tibial motor nerve showed reduced amplitude (1.3 mV).  All remaining nerves (as indicated in the following tables) were within normal limits.    All examined muscles (as indicated in the following table) showed no evidence of electrical instability.    Impression: Essentially NORMAL electrodiagnostic study of the right lower limb.  There is no significant electrodiagnostic evidence of nerve entrapment, lumbosacral plexopathy or lumbar radiculopathy.    As you know, purely sensory or demyelinating radiculopathies and chemical radiculitis may not be detected with this particular electrodiagnostic study.  Recommendations: 1.  Follow-up with referring physician. 2.  Continue current management of symptoms.  ___________________________ Naaman Plummer FAAPMR Board Certified, American Board of Physical Medicine and Rehabilitation    Nerve Conduction Studies Anti Sensory Summary Table   Stim Site NR Peak (ms) Norm Peak (ms) P-T Amp (V) Norm P-T Amp Site1 Site2 Delta-P (ms) Dist (cm) Vel (m/s) Norm Vel (m/s)  Right Saphenous Anti Sensory (Ant Med Mall)  28.7C  14cm    3.9 <4.4 9.5 >2 14cm Ant Med Mall 3.9 0.0  >32  Right Sup Fibular Anti Sensory (Ant Lat Mall)  28.7C  14 cm    3.6 <4.4 13.9 >5.0 14 cm Ant Lat Mall 3.6 14.0 39 >32  Right Sural Anti Sensory (Lat Mall)  28.4C  Calf    3.9 <4.0 9.4 >5.0 Calf Lat Mall 3.9 14.0 36 >35   Motor Summary Table   Stim Site NR Onset (ms) Norm Onset (ms) O-P Amp (mV) Norm O-P Amp Site1 Site2 Delta-0 (ms) Dist (cm) Vel (m/s) Norm Vel (m/s)  Right Fibular Motor (Ext Dig Brev)  28.5C  Ankle    4.7 <6.1 4.8 >2.5 B Fib Ankle 7.9 33.5 42 >38  B Fib    12.6  4.7  Poplt B Fib 1.9 10.0 53 >40  Poplt    14.5  4.2         Right Tibial Motor (Abd Hall Brev)  29C  Ankle    5.6 <6.1 *1.3 >3.0 Knee Ankle 7.7 43.0 56 >35  Knee    13.3  3.3          EMG   Side Muscle Nerve Root Ins Act Fibs Psw Amp Dur Poly Recrt  Int Dennie Bible Comment  Right AntTibialis Dp Br Peron L4-5 Nml Nml Nml Nml Nml 0 Nml Nml   Right Fibularis Longus  Sup Br Peron L5-S1 Nml Nml Nml Nml Nml 0 Nml Nml   Right MedGastroc Tibial S1-2 Nml Nml Nml Nml Nml 0 Nml Nml   Right VastusMed Femoral L2-4 Nml Nml Nml Nml Nml 0 Nml Nml   Right BicepsFemS Sciatic L5-S1 Nml Nml Nml Nml Nml 0 Nml Nml     Nerve Conduction Studies Anti Sensory Left/Right Comparison   Stim Site L Lat (ms) R Lat (ms) L-R Lat (ms) L Amp (V) R Amp (V) L-R Amp (%) Site1 Site2 L Vel (m/s) R Vel (m/s) L-R Vel (m/s)  Saphenous Anti Sensory (Ant Med Mall)  28.7C  14cm  3.9   9.5  14cm Ant Med Mall     Sup Fibular Anti Sensory (Ant Lat Mall)  28.7C  14 cm  3.6   13.9  14 cm Ant Lat Mall  39   Sural Anti Sensory (Lat Mall)  28.4C  Calf  3.9   9.4  Calf Lat Mall  36    Motor Left/Right Comparison   Stim Site L Lat (ms)  R Lat (ms) L-R Lat (ms) L Amp (mV) R Amp (mV) L-R Amp (%) Site1 Site2 L Vel (m/s) R Vel (m/s) L-R Vel (m/s)  Fibular Motor (Ext Dig Brev)  28.5C  Ankle  4.7   4.8  B Fib Ankle  42   B Fib  12.6   4.7  Poplt B Fib  53   Poplt  14.5   4.2        Tibial Motor (Abd Hall Brev)  29C  Ankle  5.6   *1.3  Knee Ankle  56   Knee  13.3   3.3           Waveforms:
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
## 2021-11-08 ENCOUNTER — Encounter: Payer: Self-pay | Admitting: Physical Therapy

## 2021-11-08 ENCOUNTER — Ambulatory Visit (INDEPENDENT_AMBULATORY_CARE_PROVIDER_SITE_OTHER): Payer: 59 | Admitting: Physical Therapy

## 2021-11-08 DIAGNOSIS — M5459 Other low back pain: Secondary | ICD-10-CM

## 2021-11-08 DIAGNOSIS — M6281 Muscle weakness (generalized): Secondary | ICD-10-CM | POA: Diagnosis not present

## 2021-11-08 DIAGNOSIS — R262 Difficulty in walking, not elsewhere classified: Secondary | ICD-10-CM | POA: Diagnosis not present

## 2021-11-09 ENCOUNTER — Other Ambulatory Visit (HOSPITAL_BASED_OUTPATIENT_CLINIC_OR_DEPARTMENT_OTHER): Payer: Self-pay

## 2021-11-09 ENCOUNTER — Other Ambulatory Visit (HOSPITAL_BASED_OUTPATIENT_CLINIC_OR_DEPARTMENT_OTHER): Payer: Self-pay | Admitting: Family Medicine

## 2021-11-09 MED ORDER — GLIPIZIDE 5 MG PO TABS
5.0000 mg | ORAL_TABLET | Freq: Two times a day (BID) | ORAL | 1 refills | Status: DC
  Filled 2021-11-09: qty 60, 30d supply, fill #0

## 2021-11-09 MED ORDER — FUROSEMIDE 20 MG PO TABS
20.0000 mg | ORAL_TABLET | Freq: Two times a day (BID) | ORAL | 1 refills | Status: DC
  Filled 2021-11-09: qty 60, 30d supply, fill #0
  Filled 2021-12-16: qty 60, 30d supply, fill #1

## 2021-11-10 ENCOUNTER — Encounter: Payer: 59 | Admitting: Physical Therapy

## 2021-11-10 DIAGNOSIS — I509 Heart failure, unspecified: Secondary | ICD-10-CM | POA: Insufficient documentation

## 2021-11-11 ENCOUNTER — Other Ambulatory Visit (HOSPITAL_BASED_OUTPATIENT_CLINIC_OR_DEPARTMENT_OTHER): Payer: Self-pay

## 2021-11-11 MED ORDER — TERBINAFINE HCL 250 MG PO TABS
250.0000 mg | ORAL_TABLET | Freq: Every day | ORAL | 0 refills | Status: DC
  Filled 2021-11-11: qty 30, 30d supply, fill #0

## 2021-11-15 ENCOUNTER — Encounter: Payer: Self-pay | Admitting: Physical Therapy

## 2021-11-15 ENCOUNTER — Ambulatory Visit (INDEPENDENT_AMBULATORY_CARE_PROVIDER_SITE_OTHER): Payer: Commercial Managed Care - HMO | Admitting: Physical Therapy

## 2021-11-15 DIAGNOSIS — M5459 Other low back pain: Secondary | ICD-10-CM | POA: Diagnosis not present

## 2021-11-15 DIAGNOSIS — M6281 Muscle weakness (generalized): Secondary | ICD-10-CM | POA: Diagnosis not present

## 2021-11-15 DIAGNOSIS — R262 Difficulty in walking, not elsewhere classified: Secondary | ICD-10-CM
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
## 2021-11-16 ENCOUNTER — Telehealth: Payer: Self-pay | Admitting: Orthopaedic Surgery

## 2021-11-16 NOTE — Telephone Encounter (Signed)
Called pt 1X and was unable to leave a vm . VM was full. Pt sent a mychart message asking for a referral be sent to neuro surgeon. Pt states he has tingling in right leg/foot.

## 2021-11-17 ENCOUNTER — Ambulatory Visit (INDEPENDENT_AMBULATORY_CARE_PROVIDER_SITE_OTHER): Payer: Commercial Managed Care - HMO | Admitting: Physical Therapy

## 2021-11-17 ENCOUNTER — Encounter: Payer: Self-pay | Admitting: Physical Therapy

## 2021-11-17 DIAGNOSIS — M5459 Other low back pain: Secondary | ICD-10-CM | POA: Diagnosis not present

## 2021-11-17 DIAGNOSIS — M6281 Muscle weakness (generalized): Secondary | ICD-10-CM

## 2021-11-17 DIAGNOSIS — R262 Difficulty in walking, not elsewhere classified: Secondary | ICD-10-CM | POA: Diagnosis not present
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
## 2021-11-22 ENCOUNTER — Encounter: Payer: Self-pay | Admitting: Physical Therapy

## 2021-11-22 ENCOUNTER — Ambulatory Visit (INDEPENDENT_AMBULATORY_CARE_PROVIDER_SITE_OTHER): Payer: Commercial Managed Care - HMO | Admitting: Physical Therapy

## 2021-11-22 DIAGNOSIS — M5459 Other low back pain: Secondary | ICD-10-CM

## 2021-11-22 DIAGNOSIS — M6281 Muscle weakness (generalized): Secondary | ICD-10-CM

## 2021-11-22 DIAGNOSIS — R262 Difficulty in walking, not elsewhere classified: Secondary | ICD-10-CM
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
## 2021-11-24 ENCOUNTER — Other Ambulatory Visit (HOSPITAL_BASED_OUTPATIENT_CLINIC_OR_DEPARTMENT_OTHER): Payer: Self-pay

## 2021-11-24 ENCOUNTER — Other Ambulatory Visit (HOSPITAL_BASED_OUTPATIENT_CLINIC_OR_DEPARTMENT_OTHER): Payer: Self-pay | Admitting: Family Medicine

## 2021-11-24 MED ORDER — LITHIUM CARBONATE 300 MG PO CAPS
300.0000 mg | ORAL_CAPSULE | Freq: Two times a day (BID) | ORAL | 0 refills | Status: DC
  Filled 2021-11-24: qty 60, 30d supply, fill #0

## 2021-11-24 MED ORDER — GABAPENTIN 300 MG PO CAPS
300.0000 mg | ORAL_CAPSULE | Freq: Two times a day (BID) | ORAL | 0 refills | Status: DC
  Filled 2021-11-24: qty 60, 30d supply, fill #0

## 2021-11-30 ENCOUNTER — Ambulatory Visit (INDEPENDENT_AMBULATORY_CARE_PROVIDER_SITE_OTHER): Payer: Commercial Managed Care - HMO

## 2021-11-30 ENCOUNTER — Encounter: Payer: Self-pay | Admitting: Orthopaedic Surgery

## 2021-11-30 ENCOUNTER — Ambulatory Visit (INDEPENDENT_AMBULATORY_CARE_PROVIDER_SITE_OTHER): Payer: Commercial Managed Care - HMO | Admitting: Orthopaedic Surgery

## 2021-11-30 DIAGNOSIS — G5601 Carpal tunnel syndrome, right upper limb: Secondary | ICD-10-CM

## 2021-11-30 DIAGNOSIS — M25551 Pain in right hip: Secondary | ICD-10-CM

## 2021-11-30 DIAGNOSIS — R202 Paresthesia of skin: Secondary | ICD-10-CM | POA: Diagnosis not present

## 2021-11-30 DIAGNOSIS — M1611 Unilateral primary osteoarthritis, right hip: Secondary | ICD-10-CM | POA: Diagnosis not present

## 2021-11-30 DIAGNOSIS — M5440 Lumbago with sciatica, unspecified side: Secondary | ICD-10-CM | POA: Diagnosis not present

## 2021-11-30 NOTE — Progress Notes (Unsigned)
Cardiology Office Note:    Date:  12/07/2021   ID:  Gayleen Orem, DOB [birth year], MRN [id number]  PCP:  de [place], Raymond J, MD  Cardiologist:  Elouise Munroe, MD  Electrophysiologist:  None   Referring MD: de [place], Raymond J, MD   Chief Complaint: follow-up of CHF and hypertension  History of Present Illness:    Jordan Sparks is a 46 y.o. male with a history of mild non-obstructive CAD noted on coronary CTA in 05/2021, non-ischemic cardiomyopathy/ chronic systolic CHF with EF of 93-57% on Echo in 05/2021, uncontrolled hypertension, hyperlipidemia, type 2 diabetes mellitus, obstructive sleep apnea on CPAP, congenital spinal stenosis, anxiety/depression, mood disorder, obesity, and polysubstance abuse (including prior cocaine use) who is followed by Dr. Margaretann Loveless and presents today for follow-up of CHF and hypertension.  Patient was admitted in 05/2021 after presenting with chest pain and shortness of breath. High-sensitivity troponin was minimally elevated and flat in the 20s. Echo showed LVEF of 25-30% with global hypokinesis and moderate LVH as well as normal RV and mild MR. Coronary CTA showed a coronary calcium score of 24 with only minimal soft plaque in the mid LAD. Cardiomyopathy felt to be due to longstanding uncontrolled BP. Renal artery ultrasound showed 1-59% stenosis of bilateral renal arteries. He was started on GDMT.  I received a call from Cardiac Rehab on 08/30/2021 with concern about patient participating in cardiac rehab due to elevated BP in the 160s/100s. After talking with the patient, it was discovered that patient was only taking his Entresto and Lasix but had not been taking his Amlodipine, Coreg, Spironolactone, or [place]. His Spironolactone, Coreg, and Amlodipine were restarted but Coreg and Amlodipine were started at lower doses. Close follow-up was arranged and he was seen by me in clinic on 09/01/2021 at which time BP remained very elevated. He  reported intermittent chest pain with associated shortness of breath with emotional stress but no exertional chest pain. He also described bendopnea when bending over for long periods of time. Coreg was increased and he was started on Hydralazine and Imdur. His [place] was also restarted.   He was then seen by the [facility] on 09/09/2021 at which time he was feeling better and was compliant with his medications but still having some labile BP readings. His Imdur was stopped due to headaches. Spironolactone was stopped and he was switched to Eplerenone due to gynecomastia. Hydralazine was also increased. Echo at that visit showed improvement of LVEF to 55% with moderate LVH, grade 2 diastolic dysfunction, and only trivial MR.   He was seen in the ED on 09/20/2021 for further evaluation of lightheadedness/dizziness, near syncope, and diaphoresis. He had not eaten much that day and was noted to be hypoglycemic with blood glucose of 59. He was noted to have a mildly elevated WBC but had no obvious signs of infection. Otherwise work-up was unremarkable. After several hours, patient was able to eat and drink and felt much better. Blood sugar had improved and felt to be stable for discharge.  Patient presents today for follow-up. He is doing well from a cardiac standpoint. He had one episode of chest pain in July in the setting of feeling very anxious but none since. He states his breathing is good beside his sinus issues. No orthopnea, PND, or lower extremity edema. He describes some palpitations in the setting of panic attacks/anxiety but none outside these times. No lightheadedness, dizziness, or syncope. He is compliant with all his CHF medications and  is tolerating them well. BP well controlled today. His biggest complaint today is his recent struggle with anxiety. He states his anxiety has "been through the roof for the past 45 days." He follows with Psychiatry (Dr. Nelida Gores) for this and his medications  have been adjusted recently.   He denies any tobacco use but continues to smoke marijuana. He occasionally will drink alcohol but denies any cocaine use. He states "that is a young man drug" and he does not do that anymore. He states he primarily uses the marijuana because it helps with his chronic pain issues.   Of note, patient is feeling unwell today. He sounds very congested but states this is due to his chronic sinus issues. His son as also been sick recently with a fever. He himself has not had any fevers. Recommended he test himself for COVID when he gets home.  Past Medical History:  Diagnosis Date   Anxiety    Depression    Diabetes mellitus type 2 in obese (HCC)    Hypertension    Manic disorder ([place])    Obesity     Past Surgical History:  Procedure Laterality Date   Dental procedure      Current Medications: Current Meds  Medication Sig   acetaminophen (PAIN RELIEF EXTRA STRENGTH) 500 MG tablet Take 2 tablets (1,000 mg total) by mouth every 6 (six) hours as needed for moderate pain.   amLODipine (NORVASC) 5 MG tablet Take 1 tablet (5 mg total) by mouth daily.   ARIPiprazole (ABILIFY) 5 MG tablet Take 1 tablet (5 mg total) by mouth daily.   aspirin 81 MG chewable tablet Chew 1 tablet (81 mg total) by mouth daily.   blood glucose meter kit and supplies KIT Dispense based on patient and insurance preference. Use up to four times daily as directed. (FOR ICD-9 250.00, 250.01).   buPROPion (WELLBUTRIN XL) 150 MG 24 hr tablet Take 1 tablet (150 mg total) by mouth every morning.   carvedilol (COREG) 25 MG tablet Take 1 tablet (25 mg total) by mouth 2 (two) times daily.   cetirizine (ZYRTEC) 10 MG tablet Take 1 tablet (10 mg total) by mouth daily.   dapagliflozin propanediol (FARXIGA) 10 MG TABS tablet Take 1 tablet (10 mg total) by mouth daily.   DULoxetine (CYMBALTA) 30 MG capsule Take 1 capsule (30 mg total) by mouth 2 (two) times daily, THEN 2 capsules (60 mg total) 2 (two)  times daily.   eplerenone (INSPRA) 25 MG tablet Take 1 tablet (25 mg total) by mouth daily.   fluticasone (FLONASE) 50 MCG/ACT nasal spray Place 2 sprays into both nostrils daily as needed for allergies.   furosemide (LASIX) 20 MG tablet Take 1 tablet (20 mg total) by mouth 2 (two) times daily.   gabapentin (NEURONTIN) 300 MG capsule Take 1 capsule (300 mg total) by mouth 2 (two) times daily. (Patient taking differently: Take 900 mg by mouth 2 (two) times daily.)   glipiZIDE (GLUCOTROL) 5 MG tablet Take 1 tablet (5 mg total) by mouth 2 (two) times daily.   hydrALAZINE (APRESOLINE) 50 MG tablet Take 1 tablet (50 mg total) by mouth 3 (three) times daily.   lithium carbonate 300 MG capsule Take 1 capsule (300 mg total) by mouth 2 (two) times daily.   metFORMIN (GLUCOPHAGE) 1000 MG tablet Take 1 tablet (1,000 mg total) by mouth 2 (two) times daily.   Multiple Vitamin (MULTIVITAMIN WITH MINERALS) TABS tablet Take 1 tablet by mouth daily.   NON  FORMULARY Pt uses a cpap nightly   potassium chloride SA (KLOR-CON M) 20 MEQ tablet Take 2 tablets (40 mEq total) by mouth daily.   rosuvastatin (CRESTOR) 20 MG tablet Take 1 tablet (20 mg total) by mouth daily.   rosuvastatin (CRESTOR) 5 MG tablet Take 1 tablet (5 mg total) by mouth daily.   sacubitril-valsartan (ENTRESTO) 97-103 MG Take 1 tablet by mouth 2 (two) times daily.   terbinafine (LAMISIL) 250 MG tablet Take 1 tablet (250 mg total) by mouth daily.   traZODone (DESYREL) 50 MG tablet Take 0.5 tablets (25 mg total) by mouth at bedtime as needed for sleep (insomnia).   Vitamin D, Ergocalciferol, (DRISDOL) 1.25 MG (50000 UNIT) CAPS capsule Take 1 capsule (50,000 Units total) by mouth once a week.     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, Administrator  Tobacco Use   Smoking status: Former    Packs/day: 0.25    Years: 32.00    Total pack years: 8.00    Types: Cigarettes    Quit date: 04/2021    Years since quitting: 0.6   Smokeless tobacco: Never  Vaping Use   Vaping Use: Never used  Substance and Sexual Activity   Alcohol use: Yes    Comment: occasional   Drug use: Yes    Types: Marijuana    Comment: routinely   Sexual activity: Not on file  Other Topics Concern   Not on file  Social History Narrative   Not on file   Social Determinants of Health   Financial Resource Strain: Low Risk  (06/02/2021)   Overall Financial Resource Strain (CARDIA)    Difficulty of Paying Living Expenses: Not hard at all  Food Insecurity: No Food Insecurity (06/02/2021)   Hunger Vital Sign    Worried About Running Out of Food in the Last Year: Never true    [place] in the Last Year: Never true  Transportation Needs: No Transportation Needs (06/02/2021)   PRAPARE - Hydrologist (Medical): No    Lack of Transportation (Non-Medical): No  Physical Activity: Not on file  Stress: Not on file  Social Connections: Not on file     Family History: The patient's family history includes Seizures in his brother. There is no history of Heart failure.  ROS:   Please see the history of present illness.     EKGs/Labs/Other Studies Reviewed:    The following studies were reviewed:  Echocardiogram 05/31/2021: Impressions:  1. Left ventricular ejection fraction, by estimation, is 25 to 30%. The  left ventricle has severely decreased function. The left ventricle  demonstrates global hypokinesis. There is moderate concentric left  ventricular hypertrophy. Indeterminate  diastolic filling due to E-A fusion.   2. Right ventricular systolic function is normal. The right ventricular  size is normal. Tricuspid regurgitation signal is  inadequate for assessing  PA pressure.   3. The mitral valve is grossly normal. Mild mitral valve regurgitation.  No evidence of mitral stenosis.   4. The aortic valve is tricuspid. Aortic valve regurgitation is not  visualized. No aortic stenosis is present.   5. The inferior vena cava  is dilated in size with <50% respiratory  variability, suggesting right atrial pressure of 15 mmHg.  _______________   Renal Artery Ultrasound 06/01/2021: Summary:  Renal:  - Right: 1-59% stenosis of the right renal artery. Normal right resistive Index. Normal size right kidney.  - Left:  1-59% stenosis of the left renal artery. Normal left resistive Index. Normal size of left kidney.    Mesenteric:  Areas of limited visceral study include mesenteric arteries and left renal  artery.  _______________   Coronary CTA 06/01/2021: Impressions: 1. Coronary calcium score of 24. Elevated based on age, race, and gender. 2. Normal coronary origin with left dominance. 3. CAD-RADS 1. Minimal non-obstructive CAD (1-24%). Consider non-atherosclerotic causes of chest pain. Consider preventive therapy and risk factor modification. _______________  Echocardiogram 09/09/2021: Impressions:  1. Left ventricular ejection fraction, by estimation, is 55%. The left  ventricle has normal function. The left ventricle has no regional wall  motion abnormalities. There is moderate concentric left ventricular  hypertrophy. Left ventricular diastolic  parameters are consistent with Grade II diastolic dysfunction  (pseudonormalization).   2. Right ventricular systolic function is normal. The right ventricular  size is normal.   3. Left atrial size was mild to moderately dilated.   4. The mitral valve is normal in structure. Trivial mitral valve  regurgitation. No evidence of mitral stenosis.   5. The aortic valve is normal in structure. Aortic valve regurgitation is  not visualized. No aortic stenosis is present.   6.  Aortic dilatation noted. There is borderline dilatation of the  ascending aorta, measuring 38 mm.   7. The inferior vena cava is normal in size with greater than 50%  respiratory variability, suggesting right atrial pressure of 3 mmHg.   EKG:  EKG not ordered today.   Recent Labs: 05/04/2021: TSH 1.130 06/02/2021: Magnesium 2.1 09/09/2021: B Natriuretic Peptide 15.6 09/20/2021: ALT 19; BUN 16; Creatinine, Ser 1.07; Hemoglobin 14.4; Platelets 341; Potassium 3.7; Sodium 136  Recent Lipid Panel    Component Value Date/Time   CHOL 189 05/04/2021 0953   TRIG 106 05/04/2021 0953   HDL 54 05/04/2021 0953   CHOLHDL 3.5 05/04/2021 0953   CHOLHDL 5.2 02/09/2018 0221   VLDL 37 02/09/2018 0221   LDLCALC 116 (H) 05/04/2021 0953    Physical Exam:    Vital Signs: BP 120/80 (BP Location: Left Arm, Patient Position: Sitting, Cuff Size: Large)   Pulse 86   Ht 5' 10"  (1.778 m)   Wt (!) 304 lb (137.9 kg)   SpO2 95%   BMI 43.62 kg/m     Wt Readings from Last 3 Encounters:  12/07/21 (!) 304 lb (137.9 kg)  09/29/21 (!) 305 lb 9.6 oz (138.6 kg)  09/09/21 (!) 306 lb 9.6 oz (139.1 kg)     General: 46 y.o. morbidly obese African-American male in no acute distress. HEENT: Normocephalic and atraumatic. Sclera clear.  Neck: Supple. No carotid bruits. No JVD. Heart:  RRR. Distinct S1 and S2. No murmurs, gallops, or rubs. Radial pulses 2+ and equal bilaterally. Lungs: No increased work of breathing. Clear to ausculation bilaterally. No wheezes, rhonchi, or rales.  Abdomen: Soft, non-distended, and non-tender to palpation. Extremities: No lower extremity edema.    Skin: Warm and dry. Neuro: Alert and oriented x3. No focal deficits. Psych: Normal affect. Responds appropriately.   Assessment:    1. Non-ischemic cardiomyopathy ([place])   2. Chronic systolic CHF (congestive heart failure) ([place])   3. Coronary artery disease involving native coronary artery  of native heart without angina pectoris   4.  Primary hypertension   5. Hyperlipidemia, unspecified hyperlipidemia type   6. Type 2 diabetes mellitus with obesity ([place])   7. Obstructive sleep apnea   8. Polysubstance abuse ([place])     Plan:    Non-Ischemic Cardiomyopathy Chronic Systolic CHF Diagnosed in 05/2021 during admission for acute CHF. Echo at that time showed LVEF of 25-30% with global hypokinesis and moderate LVH as well as normal RV and mild MR. Coronary CTA showed only minimal CAD. Felt to be due to uncontrolled hypertension. Repeat Echo in 08/2021 after being started on GDMT showed LVEF of 55% with no regional wall motion abnormalities, moderate LVH, grade 2 diastolic dysfunction, and only trivial MR. - Euvolemic on exam. Weight stable. - Continue Lasix 78m twice daily.  - Continue Entresto 97-1046mtwice daily. - Continue Coreg to 2560mwice daily.  - Continue Eplerenone 35m69mily.  - Continue Hydralazine 50mg52mee time daily. Unable to tolerate Imdur due to severe headaches.  - Continue Farxiga 10mg 35my. - Continue daily weights and sodium/fluid restrictions.   Non-Obstructive CAD Coronary CTA in 05/2021 showed a coronary calcium score of 24 with only minimal soft plaque in the mid LAD. - He had one episode of chest pain in July in the setting of anxiety but no other recurrent episodes.  - EKG today shows no acute ischemic changes. - Continue aspirin and statin.  Hypertension BP well controlled. - Continue medications for CHF as above.   Hyperlipidemia Most recent lipid panel in 04/2021: Total Cholesterol 189, Triglycerides 106, HDL 54, LDL 116. LDL goal <70 given CAD. - He has Crestor 5mg da72m listed under his home medications but states he has not been taking this because he ran out of the medication. Will restart Crestor at 20mg da86mand then repeat lipid panel and LFTs in 6-8 weeks.   Type 2 Diabetes Mellitus Hemoglobin A1c 7.0% on 08/29/2021. - Management per PCP.   Obstructive Sleep Apnea - Continue  CPAP.   Polysubstance Abuse Patient continues to smoke marijuana but denies any current tobacco or cocaine use. He occasional drinks alcohol. - Recommended cessation of marijuana and drinking in moderation.  Disposition: Follow up in 6 months.   Medication Adjustments/Labs and Tests Ordered: Current medicines are reviewed at length with the patient today.  Concerns regarding medicines are outlined above.  Orders Placed This Encounter  Procedures   Lipid panel   Hepatic function panel   Meds ordered this encounter  Medications   rosuvastatin (CRESTOR) 20 MG tablet    Sig: Take 1 tablet (20 mg total) by mouth daily.    Dispense:  90 tablet    Refill:  3    Patient Instructions  Medication Instructions:  Crestor 20 mg daily   *If you need a refill on your cardiac medications before your next appointment, please call your pharmacy*   Lab Work: Your physician recommends that you return for lab work in 3 months  LFTs Lipids  If you have labs (blood work) drawn today and your tests are completely normal, you will receive your results only by: MyChart [place] have MyChart) OR A paper copy in the mail If you have any lab test that is abnormal or we need to change your treatment, we will call you to review the results.   Testing/Procedures: NONE ordered at this time of appointment     Follow-Up: At [facility] your health needs are our  priority.  As part of our continuing mission to provide you with exceptional heart care, we have created designated Provider Care Teams.  These Care Teams include your primary Cardiologist (physician) and Advanced Practice Providers (APPs -  Physician Assistants and Nurse Practitioners) who all work together to provide you with the care you need, when you need it.  We recommend signing up for the patient portal called "MyChart".  Sign up information is provided on this After Visit Summary.  MyChart is used to connect  with patients for Virtual Visits (Telemedicine).  Patients are able to view lab/test results, encounter notes, upcoming appointments, etc.  Non-urgent messages can be sent to your provider as well.   To learn more about what you can do with MyChart, go to [email or web address].    Your next appointment:   2-3 month(s)  The format for your next appointment:   In Person  Provider:   Elouise Munroe, MD  or Fabian Sharp, PA-C        Other Instructions   Important Information About Sugar         Signed, Darreld Mclean, PA-C  12/07/2021 3:20 PM    [place]

## 2021-11-30 NOTE — Progress Notes (Signed)
**Note Jordan-Identified via Obfuscation** Office Visit Note   Patient: Jordan Sparks           Date of Birth: 03-17-1975           MRN: [id number] Visit Date: 11/30/2021              Requested by: Jordan Peru, Raymond J, MD [address] [place],  [place] [address] PCP: Jordan Peru, Raymond J, MD   Assessment & Plan: Visit Diagnoses:  1. Pain in right hip   2. Paresthesia of skin   3. Unilateral primary osteoarthritis, right hip   4. Acute right-sided low back pain with sciatica, sciatica laterality unspecified   5. Carpal tunnel syndrome, right upper limb     Plan: [name] comes in today in follow-up for his low back pain and right wrist carpal tunnel syndrome.  He has had injections with Dr. Alvester Sparks which have helped alleviate some of his back pain but he continues to have pain and feelings of weakness in the right leg.  He did have an electrodiagnostic study of the right lower extremity which was interpreted as normal.  He is a diabetic and he relates systems of his right foot sometimes feeling cold.  This is more than likely secondary to his diabetes and early neuropathy.  He focuses pain in his right groin and he says when he is laying down and externally rotates his hip that reproduces the pain.  X-rays did show some moderate arthritis of the right hip.  He also has continued problems with right carpal tunnel syndrome.  He did have an electrodiagnostic study in March that was abnormal and consistent with carpal tunnel syndrome.  We recommend and we will refer him to Dr. Alvester Sparks for an intra-articular injection of his right hip.  We will also refer him to Dr. Ophelia Sparks for discussion of carpal tunnel release.  Certainly he does have some stenosis and arthritis in his lower back which also can be a source of discomfort.  Follow-Up Instructions: With Dr. Alvester Sparks for right hip injection with Dr. Ophelia Sparks for surgical discussion right carpal tunnel release  Orders:  Orders Placed This Encounter  Procedures   XR HIP UNILAT W OR  W/O PELVIS 2-3 VIEWS RIGHT   Ambulatory referral to Physical Medicine Rehab   Ambulatory referral to Orthopedic Surgery   No orders of the defined types were placed in this encounter.     Procedures: No procedures performed   Clinical Data: No additional findings.   Subjective: Chief Complaint  Patient presents with   Lower Back - Pain   Right Wrist - Pain   Left Wrist - Pain  Patient presents today for follow up on his lower back and wrist pain. He has been seeing Dr.Newton concerning his lower back and has received an injection, that did offer some relief. He has pain down his right leg, along with numbness. He said that his right leg is weak and has caused him to fall twice recently. He has a stabbing sensation on the right hip side of his hip. He wants to see a neurosurgeon. 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 He has completed all physical therapy visits.     Review of Systems  All other systems reviewed and are negative.    Objective: Vital Signs: There were no vitals taken for this visit.  Physical Exam Constitutional:      Appearance: Normal appearance.  Pulmonary:     Effort:  Pulmonary effort is normal.  Skin:    General: Skin is warm and dry.  Neurological:     Mental Status: He is alert.     Ortho Exam Lower back he points to some tenderness across the lower back no palpable pain today.  His strength is 5 out of 5 with dorsiflexion plantarflexion extension and flexion today.  He does have some altered sensation in his right foot.  He does not have pain with internal/external rotation of his hip but has focal tenderness in the groin. Right wrist he has a possible Tinel's sign he has altered sensation in the first second and third digits.  Grip strength is intact.  Pulses intact.  Brisk capillary refill. BMI is 44.  Have discussed weight loss Specialty Comments:  MRI LUMBAR SPINE WITHOUT CONTRAST   TECHNIQUE: Multiplanar,  multisequence MR imaging of the lumbar spine was performed. No intravenous contrast was administered.   COMPARISON:  Lumbar spine radiographs 04/14/2021. CTA chest, abdomen, and pelvis 06/18/2020.   FINDINGS: The study is motion degraded, moderately so on the axial sequences.   Segmentation: Standard.   Alignment: Trace retrolisthesis of L4 on L5. Straightening of the normal lumbar lordosis.   Vertebrae: No fracture, suspicious marrow lesion, or significant marrow edema. Mild chronic degenerative endplate changes at L5-S1.   Conus medullaris and cauda equina: Conus extends to the L1-2 level. Conus and cauda equina appear normal.   Paraspinal and other soft tissues: Unremarkable.   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   T12-L1 and L1-2: Negative.   L2-3: Minimal disc bulging without stenosis.   L3-4: Mild disc bulging without significant stenosis.   L4-5: Mild disc desiccation. Circumferential disc bulging and mild facet hypertrophy result in mild spinal stenosis, mild bilateral lateral recess stenosis, and mild-to-moderate bilateral neural foraminal stenosis.   L5-S1: Disc desiccation. Disc bulging and mild facet hypertrophy result in mild right and moderate left neural foraminal stenosis without spinal stenosis.   IMPRESSION: 1. Motion degraded examination. 2. 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     Electronically Signed   By: Sebastian Ache M.D.   On: 05/16/2021 18:00  Imaging: XR HIP UNILAT W OR W/O PELVIS 2-3 VIEWS RIGHT  Result Date: 11/30/2021 2 view radiographs of his right hip demonstrate joint space narrowing and sclerotic changes specially at the superior femoral head acetabular interface and the inferior femoral head acetabular interface.  No acute  osseous changes    PMFS History: Patient Active Problem List   Diagnosis Date Noted   Unilateral primary osteoarthritis, right hip 11/30/2021   Carpal tunnel syndrome, right upper limb 11/30/2021   Low back pain 10/11/2021   Peripheral neuropathy 10/11/2021   Pain in left shoulder 08/31/2021   Spinal stenosis of cervical region 06/09/2021   Nonischemic cardiomyopathy (HCC) 06/06/2021   Chest pain 06/01/2021   Chest pain of uncertain etiology 05/31/2021   Obesity, Class III, BMI 40-49.9 (morbid obesity) (HCC) 05/31/2021   Diabetes mellitus (HCC) 05/04/2021   Sleep apnea 05/04/2021   Hypertensive urgency 10/19/2020   GERD (gastroesophageal reflux disease) 02/09/2018   Hyperglycemia 02/08/2018   Atypical chest pain 02/08/2018   Hypokalemia 02/08/2018   Leukocytosis 02/08/2018   Nausea and vomiting 02/08/2018   Mood disorder (HCC)    Hypertension    Anxiety    Hyperglycemia  without ketosis    Manic disorder (HCC)    Past Medical History:  Diagnosis Date   Anxiety    Depression    Diabetes mellitus type 2 in obese ([place])    Hypertension    Manic disorder ([place])    Obesity     Family History  Problem Relation Age of Onset   Seizures Brother    Heart failure Neg Hx     Past Surgical History:  Procedure Laterality Date   Dental procedure     Social History   Occupational History   Occupation: truck Geophysicist/field seismologist    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, truck Geophysicist/field seismologist  Tobacco Use   Smoking status: Former    Packs/day: 0.25    Years: 32.00    Total pack years: 8.00    Types: Cigarettes    Quit date: 04/2021    Years since quitting: 0.6   Smokeless tobacco: Never  Vaping Use   Vaping Use: Never used  Substance and Sexual Activity   Alcohol use: Yes    Comment: occasional   Drug use: Yes    Types: Marijuana    Comment: routinely   Sexual activity: Not on file

## 2021-12-02 ENCOUNTER — Ambulatory Visit (INDEPENDENT_AMBULATORY_CARE_PROVIDER_SITE_OTHER): Payer: Commercial Managed Care - HMO | Admitting: Sports Medicine

## 2021-12-02 ENCOUNTER — Encounter: Payer: Self-pay | Admitting: Sports Medicine

## 2021-12-02 ENCOUNTER — Ambulatory Visit: Payer: Self-pay

## 2021-12-02 VITALS — BP 163/97 | HR 93

## 2021-12-02 DIAGNOSIS — R202 Paresthesia of skin: Secondary | ICD-10-CM

## 2021-12-02 DIAGNOSIS — M25551 Pain in right hip: Secondary | ICD-10-CM | POA: Diagnosis not present

## 2021-12-02 NOTE — Progress Notes (Signed)
   Procedure Note  Patient: Jordan Sparks             Date of Birth: Jun 19, 1975           MRN: [id number]             Visit Date: 12/02/2021  Procedures: Visit Diagnoses:  1. Pain in right hip   2. Paresthesia of skin    Procedure: US-guided intra-articular hip injection, right After discussion on risks/benefits/indications and informed verbal consent was obtained, a timeout was performed. Patient was lying supine on exam table. The hip was cleaned with betadine and alcohol swabs. Then utilizing ultrasound guidance, the patient's femoral head and neck junction was identified and subsequently injected with 4:1 lidocaine:depomedrol via an in-plane approach with ultrasound visualization of the injectate administered into the hip joint. Patient tolerated procedure well without immediate complications.  - I evaluated the patient about 10 minutes post-injection and he had significant improvement in pain and range of motion - follow-up with Dr. Durward Fortes as indicated; I am happy to see them as needed  Elba Barman, DO [place]  This note was dictated using Dragon naturally speaking software and may contain errors in syntax, spelling, or content which have not been identified prior to signing this note.

## 2021-12-06 ENCOUNTER — Ambulatory Visit (HOSPITAL_COMMUNITY): Payer: 59 | Admitting: Psychiatry

## 2021-12-06 ENCOUNTER — Other Ambulatory Visit (HOSPITAL_BASED_OUTPATIENT_CLINIC_OR_DEPARTMENT_OTHER): Payer: Self-pay

## 2021-12-06 ENCOUNTER — Ambulatory Visit (HOSPITAL_BASED_OUTPATIENT_CLINIC_OR_DEPARTMENT_OTHER): Payer: Commercial Managed Care - HMO | Admitting: Psychiatry

## 2021-12-06 ENCOUNTER — Encounter (HOSPITAL_COMMUNITY): Payer: Self-pay | Admitting: Psychiatry

## 2021-12-06 DIAGNOSIS — F3162 Bipolar disorder, current episode mixed, moderate: Secondary | ICD-10-CM | POA: Diagnosis not present

## 2021-12-06 DIAGNOSIS — G4700 Insomnia, unspecified: Secondary | ICD-10-CM

## 2021-12-06 DIAGNOSIS — Z79899 Other long term (current) drug therapy: Secondary | ICD-10-CM | POA: Diagnosis not present

## 2021-12-06 MED ORDER — BUPROPION HCL ER (XL) 150 MG PO TB24
150.0000 mg | ORAL_TABLET | ORAL | 1 refills | Status: DC
  Filled 2021-12-06: qty 30, 30d supply, fill #0
  Filled 2021-12-26: qty 30, 30d supply, fill #1

## 2021-12-06 MED ORDER — ARIPIPRAZOLE 5 MG PO TABS
5.0000 mg | ORAL_TABLET | Freq: Every day | ORAL | 1 refills | Status: DC
  Filled 2021-12-06: qty 30, 30d supply, fill #0
  Filled 2021-12-26: qty 30, 30d supply, fill #1

## 2021-12-06 MED ORDER — DULOXETINE HCL 30 MG PO CPEP
ORAL_CAPSULE | ORAL | 0 refills | Status: DC
  Filled 2021-12-06: qty 84, 30d supply, fill #0

## 2021-12-06 MED ORDER — TRAZODONE HCL 50 MG PO TABS
25.0000 mg | ORAL_TABLET | Freq: Every evening | ORAL | 1 refills | Status: DC | PRN
  Filled 2021-12-06: qty 15, 30d supply, fill #0
  Filled 2021-12-26: qty 15, 30d supply, fill #1
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
## 2021-12-07 ENCOUNTER — Encounter: Payer: Self-pay | Admitting: Student

## 2021-12-07 ENCOUNTER — Other Ambulatory Visit: Payer: Self-pay

## 2021-12-07 ENCOUNTER — Other Ambulatory Visit (HOSPITAL_BASED_OUTPATIENT_CLINIC_OR_DEPARTMENT_OTHER): Payer: Self-pay

## 2021-12-07 ENCOUNTER — Ambulatory Visit: Payer: Commercial Managed Care - HMO | Attending: Student | Admitting: Student

## 2021-12-07 VITALS — BP 120/80 | HR 86 | Ht 70.0 in | Wt 304.0 lb

## 2021-12-07 DIAGNOSIS — E119 Type 2 diabetes mellitus without complications: Secondary | ICD-10-CM

## 2021-12-07 DIAGNOSIS — E785 Hyperlipidemia, unspecified: Secondary | ICD-10-CM

## 2021-12-07 DIAGNOSIS — I2510 Atherosclerotic heart disease of native coronary artery without angina pectoris: Secondary | ICD-10-CM

## 2021-12-07 DIAGNOSIS — I10 Essential (primary) hypertension: Secondary | ICD-10-CM | POA: Diagnosis not present

## 2021-12-07 DIAGNOSIS — I5022 Chronic systolic (congestive) heart failure: Secondary | ICD-10-CM | POA: Diagnosis not present

## 2021-12-07 DIAGNOSIS — G4733 Obstructive sleep apnea (adult) (pediatric): Secondary | ICD-10-CM

## 2021-12-07 DIAGNOSIS — I428 Other cardiomyopathies: Secondary | ICD-10-CM | POA: Diagnosis not present

## 2021-12-07 DIAGNOSIS — F1910 Other psychoactive substance abuse, uncomplicated: Secondary | ICD-10-CM

## 2021-12-07 DIAGNOSIS — E669 Obesity, unspecified: Secondary | ICD-10-CM

## 2021-12-07 DIAGNOSIS — E1169 Type 2 diabetes mellitus with other specified complication: Secondary | ICD-10-CM

## 2021-12-07 MED ORDER — ROSUVASTATIN CALCIUM 20 MG PO TABS
20.0000 mg | ORAL_TABLET | Freq: Every day | ORAL | 3 refills | Status: DC
  Filled 2021-12-07: qty 30, 30d supply, fill #0
  Filled 2021-12-26: qty 30, 30d supply, fill #1
  Filled 2022-02-05: qty 30, 30d supply, fill #2
  Filled 2022-02-28: qty 90, 90d supply, fill #3
  Filled 2022-08-04 – 2022-08-05 (×3): qty 30, 30d supply, fill #4
  Filled 2022-09-07: qty 90, 90d supply, fill #4

## 2021-12-07 NOTE — Patient Instructions (Addendum)
Medication Instructions:  Crestor 20 mg daily   *If you need a refill on your cardiac medications before your next appointment, please call your pharmacy*   Lab Work: Your physician recommends that you return for lab work in 3 months  LFTs Lipids  If you have labs (blood work) drawn today and your tests are completely normal, you will receive your results only by: [place] (if you have MyChart) OR A paper copy in the mail If you have any lab test that is abnormal or we need to change your treatment, we will call you to review the results.   Testing/Procedures: NONE ordered at this time of appointment     Follow-Up: At [facility], you and your health needs are our priority.  As part of our continuing mission to provide you with exceptional heart care, we have created designated Provider Care Teams.  These Care Teams include your primary Cardiologist (physician) and Advanced Practice Providers (APPs -  Physician Assistants and Nurse Practitioners) who all work together to provide you with the care you need, when you need it.  We recommend signing up for the patient portal called "MyChart".  Sign up information is provided on this After Visit Summary.  MyChart is used to connect with patients for Virtual Visits (Telemedicine).  Patients are able to view lab/test results, encounter notes, upcoming appointments, etc.  Non-urgent messages can be sent to your provider as well.   To learn more about what you can do with MyChart, go to [email or web address].    Your next appointment:   2-3 month(s)  The format for your next appointment:   In Person  Provider:   Elouise Munroe, MD  or Fabian Sharp, PA-C        Other Instructions   Important Information About Sugar

## 2021-12-09 ENCOUNTER — Encounter (HOSPITAL_BASED_OUTPATIENT_CLINIC_OR_DEPARTMENT_OTHER): Payer: Self-pay | Admitting: Family Medicine

## 2021-12-09 ENCOUNTER — Telehealth (INDEPENDENT_AMBULATORY_CARE_PROVIDER_SITE_OTHER): Payer: Commercial Managed Care - HMO | Admitting: Family Medicine

## 2021-12-09 DIAGNOSIS — M5440 Lumbago with sciatica, unspecified side: Secondary | ICD-10-CM

## 2021-12-09 DIAGNOSIS — E11649 Type 2 diabetes mellitus with hypoglycemia without coma: Secondary | ICD-10-CM

## 2021-12-09 DIAGNOSIS — M25512 Pain in left shoulder: Secondary | ICD-10-CM

## 2021-12-09 DIAGNOSIS — M1611 Unilateral primary osteoarthritis, right hip: Secondary | ICD-10-CM

## 2021-12-09 NOTE — Assessment & Plan Note (Signed)
Patient had episode in July of hypoglycemia which led to emergency department visit.  Since that time, he does indicate that he has had occasional low blood sugar readings, however has not presented to the emergency department for any of these episodes.  He does feel that these episodes are more associated with glipizide.  He indicates that he has been taking as prescribed and has not taken any extra doses like what happened when he went to the emergency department. Last hemoglobin A1c was at goal at 7.0%. Given symptoms, low blood sugar readings, we can hold glipizide for now and monitor blood sugar readings.  We will check hemoglobin A1c at this time as it has been a bit over 3 months since his last check.  We will need to monitor if blood sugars remain appropriately controlled without use of glipizide.  If not at goal, consider lower dose of glipizide to reduce issues with hypoglycemia

## 2021-12-09 NOTE — Progress Notes (Signed)
   Virtual Visit via Telephone   I connected with  Jordan Sparks  on 12/09/21 by telephone/telehealth and verified that I am speaking with the correct person using two identifiers.   I discussed the limitations, risks, security and privacy concerns of performing an evaluation and management service by telephone, including the higher likelihood of inaccurate diagnosis and treatment, and the availability of in person appointments.  We also discussed the likely need of an additional face to face encounter for complete and high quality delivery of care.  I also discussed with the patient that there may be a patient responsible charge related to this service. The patient expressed understanding and wishes to proceed.  Provider location is in medical facility. Patient location is at their home, different from provider location. People involved in care of the patient during this telehealth encounter were myself, my nurse/medical assistant, and my front office/scheduling team member.  Review of Systems: No fevers, chills, night sweats, weight loss, chest pain, or shortness of breath.   Objective Findings:    General: Speaking full sentences, no audible heavy breathing.  Sounds alert and appropriately interactive.    Independent interpretation of tests performed by another provider:   None.  Brief History, Exam, Impression, and Recommendations:    Diabetes mellitus ([place]) Patient had episode in July of hypoglycemia which led to emergency department visit.  Since that time, he does indicate that he has had occasional low blood sugar readings, however has not presented to the emergency department for any of these episodes.  He does feel that these episodes are more associated with glipizide.  He indicates that he has been taking as prescribed and has not taken any extra doses like what happened when he went to the emergency department. Last hemoglobin A1c was at goal at 7.0%. Given symptoms,  low blood sugar readings, we can hold glipizide for now and monitor blood sugar readings.  We will check hemoglobin A1c at this time as it has been a bit over 3 months since his last check.  We will need to monitor if blood sugars remain appropriately controlled without use of glipizide.  If not at goal, consider lower dose of glipizide to reduce issues with hypoglycemia  He additionally has concerns related to ongoing musculoskeletal pain including low back pain, right hip pain, left shoulder pain.  He has been working with various orthopedic surgeons through [facility].  He has had prior injections which have provided relief in the past, however have not been as helpful recently.  He is inquiring today about additional management and treatment options.  I discussed recommendations for further follow-up with orthopedic surgeons to discuss additional considerations.  He indicates that he would like referral to different orthopedic provider as a second opinion, referral placed today  I discussed the above assessment and treatment plan with the patient. The patient was provided an opportunity to ask questions and all were answered. The patient agreed with the plan and demonstrated an understanding of the instructions.   The patient was advised to call back or seek an in-person evaluation if the symptoms worsen or if the condition fails to improve as anticipated.   I provided 20 minutes of face to face and non-face-to-face time during this encounter date, time was needed to gather information, review chart, records, communicate/coordinate with staff remotely, as well as complete documentation.   ___________________________________________ Adolf Ormiston de [place], MD, ABFM, CAQSM [facility] and [place]

## 2021-12-13 ENCOUNTER — Ambulatory Visit (INDEPENDENT_AMBULATORY_CARE_PROVIDER_SITE_OTHER): Payer: Commercial Managed Care - HMO | Admitting: Orthopaedic Surgery

## 2021-12-13 ENCOUNTER — Encounter (HOSPITAL_BASED_OUTPATIENT_CLINIC_OR_DEPARTMENT_OTHER): Payer: Self-pay | Admitting: Orthopaedic Surgery

## 2021-12-13 ENCOUNTER — Encounter: Payer: Self-pay | Admitting: Orthopaedic Surgery

## 2021-12-13 ENCOUNTER — Other Ambulatory Visit: Payer: Self-pay

## 2021-12-13 VITALS — BP 168/100 | HR 94 | Ht 70.0 in | Wt 305.0 lb

## 2021-12-13 DIAGNOSIS — G5601 Carpal tunnel syndrome, right upper limb: Secondary | ICD-10-CM | POA: Diagnosis not present

## 2021-12-13 NOTE — Progress Notes (Signed)
   12/13/21 1220  PAT Phone Screen  Do You Have Diabetes? Yes  Do You Have Hypertension? Yes  Have You Ever Been to the ER for Asthma? No  Have You Taken Oral Steroids in the Past 3 Months? No  Do you Take Phenteramine or any Other Diet Drugs? No  Recent  Lab Work, EKG, CXR? Yes  Where was this test performed? 6/30 & 7/11 EKG  Do you have a history of heart problems? (S)  Cardiologist  Have You Ever Had Tests on Your Heart? Yes  When was Test Performed? 09/09/21  Where? Echo EF recovered to 55% ( previous echo 3/23 25%)  Any Recent Hospitalizations? No  Height 5\' 10"  (1.778 m)  Weight (!) 138.3 kg  Pat Appointment Scheduled (S)  Yes (BMET)   Pt under care of cardiology for new onset CHF, nonischemic cardiomyopathy, HTN diagnosed in March of 2023.  Pt had OV with Dr Haroldine Laws 09/09/21- to f/u with primary cardiologist Dr Margaretann Loveless.  OV 12/07/21 w/ Sarajane Jews PA-C. Reviewed with Dr Jillyn Hidden. Ok to proceed with CTR without further testing or clearance. Pt to come in for BMET at PAT appt.

## 2021-12-13 NOTE — Progress Notes (Signed)
Office Visit Note   Patient: Jordan Sparks           Date of Birth: 1975/04/22           MRN: [id number] Visit Date: 12/13/2021              Requested by: Garald Balding, [address] [place],  [place] [address] PCP: [place], Blondell Reveal, MD   Assessment & Plan: Visit Diagnoses:  1. Carpal tunnel syndrome, right upper limb     Plan: Patient like to proceed with carpal tunnel release.  He states he is anxious since he is never had surgery before.  We discussed postop soft dressing, suture removal at 2 weeks and usual surgical procedure lasting 10 to 15 minutes.  We will set this up at an outpatient facility.  Pathophysiology discussed decision for surgery made.  Follow-Up Instructions: No follow-ups on file.   Orders:  No orders of the defined types were placed in this encounter.  No orders of the defined types were placed in this encounter.     Procedures: No procedures performed   Clinical Data: No additional findings.   Subjective: Chief Complaint  Patient presents with   Right Hand - Pain   Left Hand - Pain    HPI 46 year old male referred by Dr. Durward Fortes for right carpal tunnel release.  He had positive electrical studies for moderate carpal tunnels been wearing the splint without relief.  Pain wakes him up at night.  Patient states he is never had surgery before and somewhat anxious.  He has had history of diabetes bipolar disorder obesity.  History of nonischemic cardiomyopathy in the past he states his heart is doing well.  Denies neck problems he does have some problems with numbness in his feet and has had normal nerve conduction velocities of the lower extremities.  Upper extremity nerve conduction velocity showed moderate right carpal tunnel syndrome done 05/20/2021.  History of cardiomyopathy in the past.  Echo done 09/09/2020 showed 55% ejection fraction and left ventricle had normal function.  Review of Systems all systems noncontributory to  HPI.   Objective: Vital Signs: BP (!) 168/100   Pulse 94   Ht 5\' 10"  (1.778 m)   Wt (!) 305 lb (138.3 kg)   BMI 43.76 kg/m   Physical Exam Constitutional:      Appearance: He is well-developed.  HENT:     Head: Normocephalic and atraumatic.     Right Ear: External ear normal.     Left Ear: External ear normal.  Eyes:     Pupils: Pupils are equal, round, and reactive to light.  Neck:     Thyroid: No thyromegaly.     Trachea: No tracheal deviation.  Cardiovascular:     Rate and Rhythm: Normal rate.  Pulmonary:     Effort: Pulmonary effort is normal.     Breath sounds: No wheezing.  Abdominal:     General: Bowel sounds are normal.     Palpations: Abdomen is soft.  Musculoskeletal:     Cervical back: Neck supple.  Skin:    General: Skin is warm and dry.     Capillary Refill: Capillary refill takes less than 2 seconds.  Neurological:     Mental Status: He is alert and oriented to person, place, and time.  Psychiatric:        Behavior: Behavior normal.        Thought Content: Thought content normal.  Judgment: Judgment normal.     Ortho Exam positive Phalen's ,positive carpal compression test right wrist.  Negative left wrist.  No thenar atrophy.  Good cervical range of motion.  Specialty Comments:  MRI LUMBAR SPINE WITHOUT CONTRAST   TECHNIQUE: Multiplanar, multisequence MR imaging of the lumbar spine was performed. No intravenous contrast was administered.   COMPARISON:  Lumbar spine radiographs 04/14/2021. CTA chest, abdomen, and pelvis 06/18/2020.   FINDINGS: The study is motion degraded, moderately so on the axial sequences.   Segmentation: Standard.   Alignment: Trace retrolisthesis of L4 on L5. Straightening of the normal lumbar lordosis.   Vertebrae: No fracture, suspicious marrow lesion, or significant marrow edema. Mild chronic degenerative endplate changes at L5-S1.   Conus medullaris and cauda equina: Conus extends to the L1-2  level. Conus and cauda equina appear normal.   Paraspinal and other soft tissues: Unremarkable.   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   T12-L1 and L1-2: Negative.   L2-3: Minimal disc bulging without stenosis.   L3-4: Mild disc bulging without significant stenosis.   L4-5: Mild disc desiccation. Circumferential disc bulging and mild facet hypertrophy result in mild spinal stenosis, mild bilateral lateral recess stenosis, and mild-to-moderate bilateral neural foraminal stenosis.   L5-S1: Disc desiccation. Disc bulging and mild facet hypertrophy result in mild right and moderate left neural foraminal stenosis without spinal stenosis.   IMPRESSION: 1. Motion degraded examination. 2. 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     Electronically Signed   By: Sebastian Ache M.D.   On: 05/16/2021 18:00  Imaging: No results found.   PMFS History: Patient Active Problem List   Diagnosis Date Noted   Bipolar 1 disorder, mixed, moderate (HCC) 12/06/2021   Insomnia 12/06/2021   Encounter for long-term (current) use of medications 12/06/2021   Unilateral primary osteoarthritis, right hip 11/30/2021   Carpal tunnel syndrome, right upper limb 11/30/2021   Low back pain 10/11/2021   Peripheral neuropathy 10/11/2021   Pain in left shoulder 08/31/2021   Spinal stenosis of cervical region 06/09/2021   Nonischemic cardiomyopathy (HCC) 06/06/2021   Chest pain 06/01/2021   Chest pain of uncertain etiology 05/31/2021   Obesity, Class III, BMI 40-49.9 (morbid obesity) (HCC) 05/31/2021   Diabetes mellitus (HCC) 05/04/2021   Sleep apnea 05/04/2021   Hypertensive urgency 10/19/2020   GERD (gastroesophageal reflux disease) 02/09/2018   Hyperglycemia 02/08/2018   Atypical chest pain  02/08/2018   Hypokalemia 02/08/2018   Leukocytosis 02/08/2018   Nausea and vomiting 02/08/2018   Mood disorder (HCC)    Hypertension    Anxiety    Hyperglycemia without ketosis    Manic disorder (HCC)    Past Medical History:  Diagnosis Date   Anxiety    Depression    Diabetes mellitus type 2 in obese (HCC)    Hypertension    Manic disorder (HCC)    Obesity     Family History  Problem Relation Age of Onset   Seizures Brother    Heart failure Neg Hx     Past Surgical History:  Procedure Laterality Date   Dental procedure     Social History   Occupational History   Occupation: truck Hospital doctor    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, [employer]  Tobacco Use   Smoking status: Former    Packs/day:  0.25    Years: 32.00    Total pack years: 8.00    Types: Cigarettes    Quit date: 04/2021    Years since quitting: 0.6   Smokeless tobacco: Never  Vaping Use   Vaping Use: Never used  Substance and Sexual Activity   Alcohol use: Yes    Comment: occasional   Drug use: Yes    Types: Marijuana    Comment: routinely   Sexual activity: Not on file

## 2021-12-15 ENCOUNTER — Encounter (HOSPITAL_BASED_OUTPATIENT_CLINIC_OR_DEPARTMENT_OTHER)
Admission: RE | Admit: 2021-12-15 | Discharge: 2021-12-15 | Disposition: A | Discharge status: Home / Self Care | Payer: Commercial Managed Care - HMO | Source: Ambulatory Visit | Attending: Orthopaedic Surgery | Admitting: Orthopaedic Surgery

## 2021-12-15 DIAGNOSIS — Z01812 Encounter for preprocedural laboratory examination: Secondary | ICD-10-CM | POA: Insufficient documentation

## 2021-12-15 DIAGNOSIS — E119 Type 2 diabetes mellitus without complications: Secondary | ICD-10-CM | POA: Insufficient documentation

## 2021-12-15 LAB — SURGICAL PCR SCREEN
MRSA, PCR: NEGATIVE
Staphylococcus aureus: NEGATIVE

## 2021-12-15 LAB — BASIC METABOLIC PANEL
Anion gap: 9 (ref 5–15)
BUN: 11 mg/dL (ref 6–20)
CO2: 25 mmol/L (ref 22–32)
Calcium: 8.8 mg/dL — ABNORMAL LOW (ref 8.9–10.3)
Chloride: 103 mmol/L (ref 98–111)
Creatinine, Ser: 1.01 mg/dL (ref 0.61–1.24)
GFR, Estimated: >60 mL/min (ref >60)
Glucose, Bld: 121 mg/dL — ABNORMAL HIGH (ref 70–99)
Potassium: 3.9 mmol/L (ref 3.5–5.1)
Sodium: 137 mmol/L (ref 135–145)

## 2021-12-15 NOTE — Progress Notes (Signed)

## 2021-12-16 ENCOUNTER — Other Ambulatory Visit (HOSPITAL_BASED_OUTPATIENT_CLINIC_OR_DEPARTMENT_OTHER): Payer: Self-pay | Admitting: Family Medicine

## 2021-12-16 ENCOUNTER — Other Ambulatory Visit (HOSPITAL_BASED_OUTPATIENT_CLINIC_OR_DEPARTMENT_OTHER): Payer: Self-pay

## 2021-12-16 MED ORDER — ACETAMINOPHEN 500 MG PO TABS
1000.0000 mg | ORAL_TABLET | Freq: Four times a day (QID) | ORAL | 1 refills | Status: DC | PRN
  Filled 2021-12-16: qty 30, 4d supply, fill #0

## 2021-12-16 MED ORDER — FLUTICASONE PROPIONATE 50 MCG/ACT NA SUSP
2.0000 | Freq: Every day | NASAL | 1 refills | Status: DC | PRN
  Filled 2021-12-16: qty 16, 30d supply, fill #0
  Filled 2022-01-11: qty 16, 30d supply, fill #1

## 2021-12-19 ENCOUNTER — Encounter (HOSPITAL_BASED_OUTPATIENT_CLINIC_OR_DEPARTMENT_OTHER)
Admission: RE | Disposition: A | Discharge status: Home / Self Care | Payer: Self-pay | Source: Home / Self Care | Attending: Orthopaedic Surgery

## 2021-12-19 ENCOUNTER — Other Ambulatory Visit (HOSPITAL_BASED_OUTPATIENT_CLINIC_OR_DEPARTMENT_OTHER): Payer: Self-pay

## 2021-12-19 ENCOUNTER — Encounter (HOSPITAL_BASED_OUTPATIENT_CLINIC_OR_DEPARTMENT_OTHER): Payer: Self-pay | Admitting: Orthopaedic Surgery

## 2021-12-19 ENCOUNTER — Ambulatory Visit (HOSPITAL_BASED_OUTPATIENT_CLINIC_OR_DEPARTMENT_OTHER): Payer: Commercial Managed Care - HMO | Admitting: Anesthesiology

## 2021-12-19 ENCOUNTER — Other Ambulatory Visit: Payer: Self-pay

## 2021-12-19 ENCOUNTER — Ambulatory Visit (HOSPITAL_BASED_OUTPATIENT_CLINIC_OR_DEPARTMENT_OTHER)
Admission: RE | Admit: 2021-12-19 | Discharge: 2021-12-19 | Disposition: A | Discharge status: Home / Self Care | Payer: Commercial Managed Care - HMO | Attending: Orthopaedic Surgery | Admitting: Orthopaedic Surgery

## 2021-12-19 DIAGNOSIS — K219 Gastro-esophageal reflux disease without esophagitis: Secondary | ICD-10-CM | POA: Insufficient documentation

## 2021-12-19 DIAGNOSIS — Z87891 Personal history of nicotine dependence: Secondary | ICD-10-CM | POA: Insufficient documentation

## 2021-12-19 DIAGNOSIS — E1142 Type 2 diabetes mellitus with diabetic polyneuropathy: Secondary | ICD-10-CM | POA: Diagnosis not present

## 2021-12-19 DIAGNOSIS — J45909 Unspecified asthma, uncomplicated: Secondary | ICD-10-CM | POA: Diagnosis not present

## 2021-12-19 DIAGNOSIS — Z6841 Body Mass Index (BMI) 40.0 and over, adult: Secondary | ICD-10-CM | POA: Diagnosis not present

## 2021-12-19 DIAGNOSIS — G4733 Obstructive sleep apnea (adult) (pediatric): Secondary | ICD-10-CM

## 2021-12-19 DIAGNOSIS — I2510 Atherosclerotic heart disease of native coronary artery without angina pectoris: Secondary | ICD-10-CM | POA: Insufficient documentation

## 2021-12-19 DIAGNOSIS — I110 Hypertensive heart disease with heart failure: Secondary | ICD-10-CM | POA: Insufficient documentation

## 2021-12-19 DIAGNOSIS — G5601 Carpal tunnel syndrome, right upper limb: Secondary | ICD-10-CM | POA: Diagnosis present

## 2021-12-19 DIAGNOSIS — E119 Type 2 diabetes mellitus without complications: Secondary | ICD-10-CM | POA: Insufficient documentation

## 2021-12-19 DIAGNOSIS — G4730 Sleep apnea, unspecified: Secondary | ICD-10-CM | POA: Insufficient documentation

## 2021-12-19 DIAGNOSIS — M1990 Unspecified osteoarthritis, unspecified site: Secondary | ICD-10-CM | POA: Diagnosis not present

## 2021-12-19 DIAGNOSIS — Z7984 Long term (current) use of oral hypoglycemic drugs: Secondary | ICD-10-CM | POA: Diagnosis not present

## 2021-12-19 DIAGNOSIS — F419 Anxiety disorder, unspecified: Secondary | ICD-10-CM | POA: Insufficient documentation

## 2021-12-19 DIAGNOSIS — Z9989 Dependence on other enabling machines and devices: Secondary | ICD-10-CM

## 2021-12-19 DIAGNOSIS — I5030 Unspecified diastolic (congestive) heart failure: Secondary | ICD-10-CM | POA: Diagnosis not present

## 2021-12-19 DIAGNOSIS — F319 Bipolar disorder, unspecified: Secondary | ICD-10-CM | POA: Diagnosis not present

## 2021-12-19 DIAGNOSIS — Z01818 Encounter for other preprocedural examination: Secondary | ICD-10-CM

## 2021-12-19 HISTORY — PX: CARPAL TUNNEL RELEASE: SHX101

## 2021-12-19 HISTORY — DX: Atherosclerotic heart disease of native coronary artery without angina pectoris: I25.10

## 2021-12-19 HISTORY — DX: Heart failure, unspecified: I50.9

## 2021-12-19 HISTORY — DX: Unspecified asthma, uncomplicated: J45.909

## 2021-12-19 HISTORY — DX: Sleep apnea, unspecified: G47.30

## 2021-12-19 LAB — GLUCOSE, CAPILLARY
Glucose-Capillary: 130 mg/dL — ABNORMAL HIGH (ref 70–99)
Glucose-Capillary: 109 mg/dL — ABNORMAL HIGH (ref 70–99)

## 2021-12-19 SURGERY — CARPAL TUNNEL RELEASE
Anesthesia: General | Site: Wrist | Laterality: Right

## 2021-12-19 MED ORDER — LIDOCAINE HCL (PF) 1 % IJ SOLN
INTRAMUSCULAR | Status: DC | PRN
  Administered 2021-12-19: 5 mL

## 2021-12-19 MED ORDER — ACETAMINOPHEN 500 MG PO TABS
1000.0000 mg | ORAL_TABLET | Freq: Once | ORAL | Status: AC
  Administered 2021-12-19: 1000 mg via ORAL

## 2021-12-19 MED ORDER — KETOROLAC TROMETHAMINE 30 MG/ML IJ SOLN: 30.0000 mg | Freq: Once | INTRAMUSCULAR | Status: DC | PRN

## 2021-12-19 MED ORDER — BUPIVACAINE HCL (PF) 0.25 % IJ SOLN
INTRAMUSCULAR | Status: DC | PRN
  Administered 2021-12-19: 5 mL

## 2021-12-19 MED ORDER — LIDOCAINE HCL (CARDIAC) PF 100 MG/5ML IV SOSY
PREFILLED_SYRINGE | INTRAVENOUS | Status: DC | PRN
  Administered 2021-12-19: 60 mg via INTRAVENOUS

## 2021-12-19 MED ORDER — ONDANSETRON HCL 4 MG/2ML IJ SOLN
INTRAMUSCULAR | Status: DC | PRN
  Administered 2021-12-19: 4 mg via INTRAVENOUS

## 2021-12-19 MED ORDER — LACTATED RINGERS IV SOLN: INTRAVENOUS | Status: DC

## 2021-12-19 MED ORDER — MEPERIDINE HCL 25 MG/ML IJ SOLN: 6.2500 mg | INTRAMUSCULAR | Status: DC | PRN

## 2021-12-19 MED ORDER — HYDROCODONE-ACETAMINOPHEN 5-325 MG PO TABS
1.0000 | ORAL_TABLET | ORAL | 0 refills | Status: DC | PRN
  Filled 2021-12-19: qty 30, 5d supply, fill #0

## 2021-12-19 MED ORDER — DEXAMETHASONE SODIUM PHOSPHATE 10 MG/ML IJ SOLN
INTRAMUSCULAR | Status: DC | PRN
  Administered 2021-12-19: 5 mg via INTRAVENOUS

## 2021-12-19 MED ORDER — HYDROMORPHONE HCL 1 MG/ML IJ SOLN: 0.2500 mg | INTRAMUSCULAR | Status: DC | PRN

## 2021-12-19 MED ORDER — OXYCODONE HCL 5 MG PO TABS: 5.0000 mg | ORAL_TABLET | Freq: Once | ORAL | Status: DC | PRN

## 2021-12-19 MED ORDER — AMISULPRIDE (ANTIEMETIC) 5 MG/2ML IV SOLN: 10.0000 mg | Freq: Once | INTRAVENOUS | Status: DC | PRN

## 2021-12-19 MED ORDER — FENTANYL CITRATE (PF) 100 MCG/2ML IJ SOLN
INTRAMUSCULAR | Status: AC
  Filled 2021-12-19: qty 2

## 2021-12-19 MED ORDER — FENTANYL CITRATE (PF) 100 MCG/2ML IJ SOLN
INTRAMUSCULAR | Status: DC | PRN
  Administered 2021-12-19: 50 ug via INTRAVENOUS

## 2021-12-19 MED ORDER — CEFAZOLIN IN SODIUM CHLORIDE 3-0.9 GM/100ML-% IV SOLN
3.0000 g | INTRAVENOUS | Status: AC
  Administered 2021-12-19: 3 g via INTRAVENOUS

## 2021-12-19 MED ORDER — 0.9 % SODIUM CHLORIDE (POUR BTL) OPTIME
TOPICAL | Status: DC | PRN
  Administered 2021-12-19: 75 mL

## 2021-12-19 MED ORDER — ONDANSETRON HCL 4 MG/2ML IJ SOLN: 4.0000 mg | Freq: Once | INTRAMUSCULAR | Status: DC | PRN

## 2021-12-19 MED ORDER — CEFAZOLIN IN SODIUM CHLORIDE 3-0.9 GM/100ML-% IV SOLN
INTRAVENOUS | Status: AC
  Filled 2021-12-19: qty 100

## 2021-12-19 MED ORDER — OXYCODONE HCL 5 MG/5ML PO SOLN: 5.0000 mg | Freq: Once | ORAL | Status: DC | PRN

## 2021-12-19 MED ORDER — ACETAMINOPHEN 500 MG PO TABS
ORAL_TABLET | ORAL | Status: AC
  Filled 2021-12-19: qty 2

## 2021-12-19 MED ORDER — PHENYLEPHRINE HCL (PRESSORS) 10 MG/ML IV SOLN
INTRAVENOUS | Status: DC | PRN
  Administered 2021-12-19: 240 ug via INTRAVENOUS
  Administered 2021-12-19: 160 ug via INTRAVENOUS

## 2021-12-19 MED ORDER — PROPOFOL 10 MG/ML IV BOLUS
INTRAVENOUS | Status: DC | PRN
  Administered 2021-12-19: 300 mg via INTRAVENOUS
  Administered 2021-12-19: 100 mg via INTRAVENOUS

## 2021-12-19 SURGICAL SUPPLY — 37 items
APL SKNCLS STERI-STRIP NONHPOA (GAUZE/BANDAGES/DRESSINGS)
BENZOIN TINCTURE PRP APPL 2/3 (GAUZE/BANDAGES/DRESSINGS) IMPLANT
BLADE SURG 15 STRL LF DISP TIS (BLADE) ×1 IMPLANT
BLADE SURG 15 STRL SS (BLADE) ×1
BNDG CMPR 9X4 STRL LF SNTH (GAUZE/BANDAGES/DRESSINGS) ×1
BNDG ELASTIC 4X5.8 VLCR STR LF (GAUZE/BANDAGES/DRESSINGS) ×1 IMPLANT
BNDG ESMARK 4X9 LF (GAUZE/BANDAGES/DRESSINGS) IMPLANT
CORD BIPOLAR FORCEPS 12FT (ELECTRODE) ×1 IMPLANT
COVER BACK TABLE 60X90IN (DRAPES) ×1 IMPLANT
COVER MAYO STAND STRL (DRAPES) ×1 IMPLANT
CUFF TOURN SGL QUICK 18X4 (TOURNIQUET CUFF) ×1 IMPLANT
DRAPE EXTREMITY T 121X128X90 (DISPOSABLE) ×1 IMPLANT
DRAPE SURG 17X23 STRL (DRAPES) ×1 IMPLANT
DURAPREP 26ML APPLICATOR (WOUND CARE) ×1 IMPLANT
GAUZE SPONGE 4X4 12PLY STRL (GAUZE/BANDAGES/DRESSINGS) ×1 IMPLANT
GAUZE XEROFORM 1X8 LF (GAUZE/BANDAGES/DRESSINGS) ×1 IMPLANT
GLOVE BIO SURGEON STRL SZ7.5 (GLOVE) ×1 IMPLANT
GLOVE BIOGEL PI IND STRL 8 (GLOVE) ×1 IMPLANT
GOWN STRL REUS W/ TWL LRG LVL3 (GOWN DISPOSABLE) ×1 IMPLANT
GOWN STRL REUS W/ TWL XL LVL3 (GOWN DISPOSABLE) ×1 IMPLANT
GOWN STRL REUS W/TWL LRG LVL3 (GOWN DISPOSABLE) ×1
GOWN STRL REUS W/TWL XL LVL3 (GOWN DISPOSABLE) ×1
LOOP VESSEL MAXI BLUE (MISCELLANEOUS) IMPLANT
NDL HYPO 25X1 1.5 SAFETY (NEEDLE) IMPLANT
NEEDLE HYPO 25X1 1.5 SAFETY (NEEDLE) ×1 IMPLANT
NS IRRIG 1000ML POUR BTL (IV SOLUTION) ×1 IMPLANT
PACK BASIN DAY SURGERY FS (CUSTOM PROCEDURE TRAY) ×1 IMPLANT
PAD CAST 3X4 CTTN HI CHSV (CAST SUPPLIES) ×1 IMPLANT
PADDING CAST ABS COTTON 4X4 ST (CAST SUPPLIES) ×1 IMPLANT
PADDING CAST COTTON 3X4 STRL (CAST SUPPLIES) ×1
STOCKINETTE 4X48 STRL (DRAPES) ×1 IMPLANT
STRIP CLOSURE SKIN 1/2X4 (GAUZE/BANDAGES/DRESSINGS) IMPLANT
SUT ETHILON 3 0 PS 1 (SUTURE) ×1 IMPLANT
SYR BULB EAR ULCER 3OZ GRN STR (SYRINGE) ×1 IMPLANT
SYR CONTROL 10ML LL (SYRINGE) IMPLANT
TOWEL GREEN STERILE FF (TOWEL DISPOSABLE) ×1 IMPLANT
UNDERPAD 30X36 HEAVY ABSORB (UNDERPADS AND DIAPERS) ×1 IMPLANT

## 2021-12-19 NOTE — Anesthesia Preprocedure Evaluation (Addendum)
Anesthesia Evaluation  Patient identified by MRN, date of birth, ID band Patient awake    Reviewed: Allergy & Precautions, NPO status , Patient's Chart, lab work & pertinent test results, reviewed documented beta blocker date and time   Airway Mallampati: III  TM Distance: >3 FB Neck ROM: Full    Dental no notable dental hx. (+) Teeth Intact, Dental Advisory Given   Pulmonary asthma , sleep apnea and Continuous Positive Airway Pressure Ventilation , former smoker,  Quit smoking 04/2021, 8 pack year history    Pulmonary exam normal breath sounds clear to auscultation       Cardiovascular hypertension (158/107 in preop), Pt. on medications and Pt. on home beta blockers + CAD and +CHF (normal LVEF< grade 2 diastolic dysfunction)  Normal cardiovascular exam Rhythm:Regular Rate:Normal  Echo 08/2021 1. Left ventricular ejection fraction, by estimation, is 55%. The left  ventricle has normal function. The left ventricle has no regional wall  motion abnormalities. There is moderate concentric left ventricular  hypertrophy. Left ventricular diastolic  parameters are consistent with Grade II diastolic dysfunction  (pseudonormalization).  2. Right ventricular systolic function is normal. The right ventricular  size is normal.  3. Left atrial size was mild to moderately dilated.  4. The mitral valve is normal in structure. Trivial mitral valve  regurgitation. No evidence of mitral stenosis.  5. The aortic valve is normal in structure. Aortic valve regurgitation is  not visualized. No aortic stenosis is present.  6. Aortic dilatation noted. There is borderline dilatation of the  ascending aorta, measuring 38 mm.  7. The inferior vena cava is normal in size with greater than 50%  respiratory variability, suggesting right atrial pressure of 3 mmHg.    Neuro/Psych PSYCHIATRIC DISORDERS Anxiety Depression Bipolar Disorder negative  neurological ROS     GI/Hepatic Neg liver ROS, GERD  Controlled and Medicated,  Endo/Other  diabetes, Well Controlled, Type 2, Oral Hypoglycemic AgentsMorbid obesityBMI 44 Last a1c 7.0  Renal/GU negative Renal ROS  negative genitourinary   Musculoskeletal  (+) Arthritis , Osteoarthritis,    Abdominal (+) + obese,   Peds  Hematology negative hematology ROS (+)   Anesthesia Other Findings   Reproductive/Obstetrics negative OB ROS                            Anesthesia Physical Anesthesia Plan  ASA: 3  Anesthesia Plan: General   Post-op Pain Management: Tylenol PO (pre-op)*   Induction:   PONV Risk Score and Plan: 2 and Treatment may vary due to age or medical condition, Ondansetron, Midazolam and Dexamethasone  Airway Management Planned: LMA  Additional Equipment: None  Intra-op Plan:   Post-operative Plan: Extubation in OR  Informed Consent: I have reviewed the patients History and Physical, chart, labs and discussed the procedure including the risks, benefits and alternatives for the proposed anesthesia with the patient or authorized representative who has indicated his/her understanding and acceptance.     Dental advisory given  Plan Discussed with: CRNA  Anesthesia Plan Comments:        Anesthesia Quick Evaluation

## 2021-12-19 NOTE — Discharge Instructions (Addendum)
Keep hand elevated above your heart to decrease pain.  You can use ice and on for the first 48 to 72 hours to help with the pain.  Pain medication has been sent to your pharmacy.  Leave your dressing on and keep it dry.  You can shower if you place a bag over the dressing and tape it and while you are in the shower keep your hand pointing up to keep the dressing dry.  You will see my PA Benjiman Core, PA-C in about 1 week for dressing change and then application of new dressing and then see me at 2 weeks postop for suture removal.   No Tylenol until 4:10 today if needed  Post Anesthesia Home Care Instructions  Activity: Get plenty of rest for the remainder of the day. A responsible individual must stay with you for 24 hours following the procedure.  For the next 24 hours, DO NOT: -Drive a car -Paediatric nurse -Drink alcoholic beverages -Take any medication unless instructed by your physician -Make any legal decisions or sign important papers.  Meals: Start with liquid foods such as gelatin or soup. Progress to regular foods as tolerated. Avoid greasy, spicy, heavy foods. If nausea and/or vomiting occur, drink only clear liquids until the nausea and/or vomiting subsides. Call your physician if vomiting continues.  Special Instructions/Symptoms: Your throat may feel dry or sore from the anesthesia or the breathing tube placed in your throat during surgery. If this causes discomfort, gargle with warm salt water. The discomfort should disappear within 24 hours.  If you had a scopolamine patch placed behind your ear for the management of post- operative nausea and/or vomiting:  1. The medication in the patch is effective for 72 hours, after which it should be removed.  Wrap patch in a tissue and discard in the trash. Wash hands thoroughly with soap and water. 2. You may remove the patch earlier than 72 hours if you experience unpleasant side effects which may include dry mouth, dizziness or  visual disturbances. 3. Avoid touching the patch. Wash your hands with soap and water after contact with the patch.     No Tylenol until after 4:10pm today.

## 2021-12-19 NOTE — Anesthesia Procedure Notes (Signed)
Procedure Name: LMA Insertion Date/Time: 12/19/2021 12:18 PM  Performed by: Verita Lamb, CRNAPre-anesthesia Checklist: Patient identified, Emergency Drugs available, Suction available and Patient being monitored Patient Re-evaluated:Patient Re-evaluated prior to induction Oxygen Delivery Method: Circle system utilized Preoxygenation: Pre-oxygenation with 100% oxygen Induction Type: IV induction Ventilation: Mask ventilation without difficulty LMA: LMA inserted LMA Size: 5.0 Number of attempts: 1 Airway Equipment and Method: Bite block Placement Confirmation: positive ETCO2, CO2 detector and breath sounds checked- equal and bilateral Tube secured with: Tape Dental Injury: Teeth and Oropharynx as per pre-operative assessment

## 2021-12-19 NOTE — Interval H&P Note (Signed)
History and Physical Interval Note:  12/19/2021 11:27 AM  Jordan Sparks  has presented today for surgery, with the diagnosis of right carpal tunnel syndrome.  The various methods of treatment have been discussed with the patient and family. After consideration of risks, benefits and other options for treatment, the patient has consented to  Procedure(s): RIGHT CARPAL TUNNEL RELEASE (Right) as a surgical intervention.  The patient's history has been reviewed, patient examined, no change in status, stable for surgery.  I have reviewed the patient's chart and labs.  Questions were answered to the patient's satisfaction.     Marybelle Killings

## 2021-12-19 NOTE — Anesthesia Postprocedure Evaluation (Signed)
Anesthesia Post Note  Patient: Zackerie McGregor-Shabazz  Procedure(s) Performed: RIGHT CARPAL TUNNEL RELEASE (Right: Wrist)     Patient location during evaluation: PACU Anesthesia Type: General Level of consciousness: awake and alert, oriented and patient cooperative Pain management: pain level controlled Vital Signs Assessment: post-procedure vital signs reviewed and stable Respiratory status: spontaneous breathing, nonlabored ventilation and respiratory function stable Cardiovascular status: blood pressure returned to baseline and stable Postop Assessment: no apparent nausea or vomiting Anesthetic complications: no   No notable events documented.  Last Vitals:  Vitals:   12/19/21 1248 12/19/21 1256  BP: (!) 161/110 (!) 173/106  Pulse: 80 77  Resp: 13 16  Temp:  36.6 C  SpO2: 94% 94%    Last Pain:  Vitals:   12/19/21 1256  TempSrc:   PainSc: 0-No pain                 Pervis Hocking

## 2021-12-19 NOTE — Op Note (Signed)
Preop diagnosis: Right carpal tunnel syndrome  Postop diagnosis: Same  Procedure: Right carpal tunnel release  Surgeon: Lorin Mercy MD  Anesthesia General LMA +5 cc Marcaine skin local.  Tourniquet forearm tourniquet 200 mm pressure time 6 minutes.  Complications none.  Findings: Median nerve compression in the carpal canal.  Procedure: After induction of anesthesia due to patient's sleep apnea LMA tube placement proximal left forearm tourniquet DuraPrep the usual lower extremity sheets and drapes marked infiltration after timeout procedure was completed no antibiotics were indicated.  Hand was elevated wrapped in Esmarch tourniquet inflated to 200 mm pressure.  Incision was made and subtenons tissue was divided bipolar cautery was used on small arterial and venous bleeders in the subcutaneous tissue.  Palmar fascia was divided as well as the palmaris brevis muscle exposing the transverse carpal ligament.  Transverse carpal ligament was divided along the ulnar aspect of the median nerve.  There was typical hourglass compression of the nerve in the carpal canal.  Continued release proximal and distal until fingertip could be placed past the distal wrist crease into the distal forearm as well as exposure of the arteriovenous bundle distally in the in the proximal arch of the palm.  Tourniquet was deflated hemostasis obtained with the bipolar coagulation muscle and the palmaris brevis that had some venous oozing.  Irrigation with saline solution and then skin closure with 3-0 nylon interrupted sutures.  Xeroform 4 x 4's 20 nurse web roll and Ace wrap are applied for postoperative dressing.  Outpatient surgery is appropriate for treatment of this condition.

## 2021-12-19 NOTE — Interval H&P Note (Signed)
History and Physical Interval Note:  12/19/2021 11:27 AM  Jordan Sparks  has presented today for surgery, with the diagnosis of right carpal tunnel syndrome.  The various methods of treatment have been discussed with the patient and family. After consideration of risks, benefits and other options for treatment, the patient has consented to  Procedure(s): RIGHT CARPAL TUNNEL RELEASE (Right) as a surgical intervention.  The patient's history has been reviewed, patient examined, no change in status, stable for surgery.  I have reviewed the patient's chart and labs.  Questions were answered to the patient's satisfaction.     Sakshi Sermons C Aarya Quebedeaux   

## 2021-12-19 NOTE — Transfer of Care (Signed)
Immediate Anesthesia Transfer of Care Note  Patient: Jordan Sparks  Procedure(s) Performed: RIGHT CARPAL TUNNEL RELEASE (Right: Wrist)  Patient Location: PACU  Anesthesia Type:General  Level of Consciousness: awake, alert  and oriented  Airway & Oxygen Therapy: Patient Spontanous Breathing and Patient connected to face mask oxygen  Post-op Assessment: Report given to RN and Post -op Vital signs reviewed and stable  Post vital signs: Reviewed and stable  Last Vitals:  Vitals Value Taken Time  BP    Temp    Pulse    Resp    SpO2      Last Pain:  Vitals:   12/19/21 1006  TempSrc: Oral  PainSc: 8          Complications: No notable events documented.

## 2021-12-19 NOTE — H&P (Signed)
Patient: Jordan Sparks                                      Date of Birth: 03/27/1975                                                    MRN: [id number] Visit Date: 12/13/2021                                                                     Requested by: Garald Balding, [address] [place],  [place] [address] PCP: [place], Blondell Reveal, MD     Assessment & Plan: Visit Diagnoses:  1. Carpal tunnel syndrome, right upper limb       Plan: Patient like to proceed with carpal tunnel release.  He states he is anxious since he is never had surgery before.  We discussed postop soft dressing, suture removal at 2 weeks and usual surgical procedure lasting 10 to 15 minutes.  We will set this up at an outpatient facility.  Pathophysiology discussed decision for surgery made.   Follow-Up Instructions: No follow-ups on file.    Orders:  No orders of the defined types were placed in this encounter.   No orders of the defined types were placed in this encounter.        Procedures: No procedures performed     Clinical Data: No additional findings.     Subjective:    Chief Complaint  Patient presents with   Right Hand - Pain   Left Hand - Pain      HPI 46 year old male referred by Dr. Durward Fortes for right carpal tunnel release.  He had positive electrical studies for moderate carpal tunnels been wearing the splint without relief.  Pain wakes him up at night.  Patient states he is never had surgery before and somewhat anxious.  He has had history of diabetes bipolar disorder obesity.  History of nonischemic cardiomyopathy in the past he states his heart is doing well.  Denies neck problems he does have some problems with numbness in his feet and has had normal nerve conduction velocities of the lower extremities.  Upper extremity nerve conduction velocity showed moderate right carpal tunnel syndrome done 05/20/2021.   History of cardiomyopathy in the past.  Echo done  09/09/2020 showed 55% ejection fraction and left ventricle had normal function.   Review of Systems all systems noncontributory to HPI.     Objective: Vital Signs: BP (!) 168/100   Pulse 94   Ht 5\' 10"  (1.778 m)   Wt (!) 305 lb (138.3 kg)   BMI 43.76 kg/m    Physical Exam Constitutional:      Appearance: He is well-developed.  HENT:     Head: Normocephalic and atraumatic.     Right Ear: External ear normal.     Left Ear: External ear normal.  Eyes:     Pupils: Pupils are equal, round, and reactive to light.  Neck:     Thyroid:  No thyromegaly.     Trachea: No tracheal deviation.  Cardiovascular:     Rate and Rhythm: Normal rate.  Pulmonary:     Effort: Pulmonary effort is normal.     Breath sounds: No wheezing.  Abdominal:     General: Bowel sounds are normal.     Palpations: Abdomen is soft.  Musculoskeletal:     Cervical back: Neck supple.  Skin:    General: Skin is warm and dry.     Capillary Refill: Capillary refill takes less than 2 seconds.  Neurological:     Mental Status: He is alert and oriented to person, place, and time.  Psychiatric:        Behavior: Behavior normal.        Thought Content: Thought content normal.        Judgment: Judgment normal.        Ortho Exam positive Phalen's ,positive carpal compression test right wrist.  Negative left wrist.  No thenar atrophy.  Good cervical range of motion.   Specialty Comments:  MRI LUMBAR SPINE WITHOUT CONTRAST   TECHNIQUE: Multiplanar, multisequence MR imaging of the lumbar spine was performed. No intravenous contrast was administered.   COMPARISON:  Lumbar spine radiographs 04/14/2021. CTA chest, abdomen, and pelvis 06/18/2020.   FINDINGS: The study is motion degraded, moderately so on the axial sequences.   Segmentation: Standard.   Alignment: Trace retrolisthesis of L4 on L5. Straightening of the normal lumbar lordosis.   Vertebrae: No fracture, suspicious marrow lesion, or  significant marrow edema. Mild chronic degenerative endplate changes at 075-GRM.   Conus medullaris and cauda equina: Conus extends to the L1-2 level. Conus and cauda equina appear normal.   Paraspinal and other soft tissues: Unremarkable.   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   T12-L1 and L1-2: Negative.   L2-3: Minimal disc bulging without stenosis.   L3-4: Mild disc bulging without significant stenosis.   L4-5: Mild disc desiccation. Circumferential disc bulging and mild facet hypertrophy result in mild spinal stenosis, mild bilateral lateral recess stenosis, and mild-to-moderate bilateral neural foraminal stenosis.   L5-S1: Disc desiccation. Disc bulging and mild facet hypertrophy result in mild right and moderate left neural foraminal stenosis without spinal stenosis.   IMPRESSION: 1. Motion degraded examination. 2. 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     Electronically Signed   By: Logan Bores M.D.   On: 05/16/2021 18:00   Imaging: No results found.     PMFS History:     Patient Active Problem List    Diagnosis Date Noted   Bipolar 1 disorder, mixed, moderate ([place]) 12/06/2021   Insomnia 12/06/2021   Encounter for long-term (current) use of medications 12/06/2021   Unilateral primary osteoarthritis, right hip 11/30/2021   Carpal tunnel syndrome, right upper limb 11/30/2021   Low back pain 10/11/2021   Peripheral neuropathy 10/11/2021   Pain in left shoulder 08/31/2021   Spinal stenosis of cervical region 06/09/2021   Nonischemic cardiomyopathy ([place]) 06/06/2021   Chest pain 06/01/2021   Chest pain of uncertain etiology [id number]   Obesity, Class III, BMI 40-49.9 (morbid obesity) ([place]) 05/31/2021   Diabetes mellitus ([place]) 05/04/2021   Sleep apnea  05/04/2021   Hypertensive urgency 10/19/2020   GERD (gastroesophageal reflux disease) 02/09/2018   Hyperglycemia 02/08/2018   Atypical chest pain  02/08/2018   Hypokalemia 02/08/2018   Leukocytosis 02/08/2018   Nausea and vomiting 02/08/2018   Mood disorder (HCC)     Hypertension     Anxiety     Hyperglycemia without ketosis     Manic disorder (HCC)          Past Medical History:  Diagnosis Date   Anxiety     Depression     Diabetes mellitus type 2 in obese ([place])     Hypertension     Manic disorder ([place])     Obesity           Family History  Problem Relation Age of Onset   Seizures Brother     Heart failure Neg Hx           Past Surgical History:  Procedure Laterality Date   Dental procedure        Social History         Occupational History   Occupation: truck Geophysicist/field seismologist      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

## 2021-12-20 ENCOUNTER — Encounter (HOSPITAL_BASED_OUTPATIENT_CLINIC_OR_DEPARTMENT_OTHER): Payer: Self-pay | Admitting: Orthopaedic Surgery

## 2021-12-20 ENCOUNTER — Telehealth: Payer: Self-pay | Admitting: Physician Assistant

## 2021-12-20 NOTE — Telephone Encounter (Signed)
   I am covering [place] today. We received the following outside msg from pt's pharmacy: "[facility] works with your patients' plan sponsors to provide you with the enclosed [employer] safety and health considerations for patients in your practice. Please review the health information provided and make any changes in therapy that you believe are appropriate. [facility] understands that the information may not be applicable to every patient's therapy and therefore presents it as informational only. Drug Safety Consideration: LITHIUM CARBONATE and HEART FAILURE Our claims record suggests that your patient is receiving LITHIUM CARBONATE and has been diagnosed with HEART FAILURE, a condition that may increase the potential for lithium toxicity. The risk of lithium toxicity is high in patients with cardiovascular disease; extreme caution is advised. Toxic signs can occur at doses close to therapeutic levels. Please consider periodic monitoring of lithium levels during therapy." Lithium also interacts with many medications including Lasix.   It looks the prescriber of this medicine is Dr. [place] therefore I will forward him this message as Juluis Rainier so he is aware regarding lithium rx/monitoring.  Charlie Pitter, PA-C

## 2021-12-23 ENCOUNTER — Telehealth: Payer: Self-pay | Admitting: Orthopaedic Surgery

## 2021-12-23 NOTE — Telephone Encounter (Signed)
Pt called requesting a copy of xrays of his leg from 11/30/21 visit. Please call pt when ready for pick up [phone]

## 2021-12-26 ENCOUNTER — Other Ambulatory Visit: Payer: Self-pay | Admitting: Student

## 2021-12-26 ENCOUNTER — Encounter: Payer: Self-pay | Admitting: Surgery

## 2021-12-26 ENCOUNTER — Ambulatory Visit (INDEPENDENT_AMBULATORY_CARE_PROVIDER_SITE_OTHER): Payer: Commercial Managed Care - HMO | Admitting: Surgery

## 2021-12-26 ENCOUNTER — Other Ambulatory Visit (HOSPITAL_BASED_OUTPATIENT_CLINIC_OR_DEPARTMENT_OTHER): Payer: Self-pay | Admitting: Family Medicine

## 2021-12-26 ENCOUNTER — Other Ambulatory Visit (HOSPITAL_BASED_OUTPATIENT_CLINIC_OR_DEPARTMENT_OTHER): Payer: Self-pay

## 2021-12-26 ENCOUNTER — Other Ambulatory Visit: Payer: Self-pay | Admitting: Orthopaedic Surgery

## 2021-12-26 ENCOUNTER — Other Ambulatory Visit (HOSPITAL_COMMUNITY): Payer: Self-pay | Admitting: Psychiatry

## 2021-12-26 DIAGNOSIS — Z9889 Other specified postprocedural states: Secondary | ICD-10-CM

## 2021-12-26 DIAGNOSIS — F3162 Bipolar disorder, current episode mixed, moderate: Secondary | ICD-10-CM

## 2021-12-26 DIAGNOSIS — I10 Essential (primary) hypertension: Secondary | ICD-10-CM

## 2021-12-26 MED ORDER — DULOXETINE HCL 60 MG PO CPEP
60.0000 mg | ORAL_CAPSULE | Freq: Two times a day (BID) | ORAL | 0 refills | Status: DC
  Filled 2021-12-26: qty 60, 30d supply, fill #0

## 2021-12-26 MED ORDER — CETIRIZINE HCL 10 MG PO TABS
10.0000 mg | ORAL_TABLET | Freq: Every day | ORAL | 0 refills | Status: DC
  Filled 2021-12-26 (×2): qty 100, 100d supply, fill #0

## 2021-12-26 MED ORDER — AMLODIPINE BESYLATE 5 MG PO TABS
5.0000 mg | ORAL_TABLET | Freq: Every day | ORAL | 1 refills | Status: DC
  Filled 2021-12-26: qty 30, 30d supply, fill #0
  Filled 2022-01-25: qty 30, 30d supply, fill #1

## 2021-12-26 MED ORDER — LITHIUM CARBONATE 300 MG PO CAPS
300.0000 mg | ORAL_CAPSULE | Freq: Two times a day (BID) | ORAL | 0 refills | Status: DC
  Filled 2021-12-26: qty 60, 30d supply, fill #0

## 2021-12-26 MED ORDER — GABAPENTIN 300 MG PO CAPS
300.0000 mg | ORAL_CAPSULE | Freq: Two times a day (BID) | ORAL | 0 refills | Status: DC
  Filled 2021-12-26: qty 60, 30d supply, fill #0

## 2021-12-26 MED ORDER — HYDROCODONE-ACETAMINOPHEN 5-325 MG PO TABS
1.0000 | ORAL_TABLET | Freq: Four times a day (QID) | ORAL | 0 refills | Status: DC | PRN
  Filled 2021-12-26: qty 40, 10d supply, fill #0

## 2021-12-26 NOTE — Telephone Encounter (Signed)
X ray arm 07/26,04/27 MRI Spine 03/06 Would like copies of these also, please advise please call [phone] if you cannot reach primary by primary number

## 2021-12-26 NOTE — Progress Notes (Signed)
Post-Op Visit Note   Patient: Jordan Sparks           Date of Birth: 1976-01-08           MRN: [id number] Visit Date: 12/26/2021 PCP: de [place], Raymond J, MD   Assessment & Plan:  Chief Complaint: No chief complaint on file. 46 year old male who is 1 week out from carpal tunnel release returns.  States that he is doing well.  Admits to at times doing a little too much with his right hand. Visit Diagnoses:  1. S/P carpal tunnel release     Plan: I will have patient follow-up with Dr. Lorin Mercy in 1 week for wound check and possible suture removal.  He can work gentle range of motion of his fingers and wrist.  New dressing applied today and he was given a removable resplint.  Advised him no aggressive activity with lifting, pushing, pulling with his right hand.  Do not apply any creams or ointments to his incision.  Continue to keep his hand as dry as possible.  Anticipate no working at least 6 weeks postop.  Follow-Up Instructions: Return in about 1 week (around 01/02/2022) for WITH DR [place]. .   Orders:  No orders of the defined types were placed in this encounter.  Meds ordered this encounter  Medications   HYDROcodone-acetaminophen (NORCO/VICODIN) 5-325 MG tablet    Sig: Take 1 tablet by mouth every 6 (six) hours as needed for moderate pain.    Dispense:  40 tablet    Refill:  0    Imaging: No results found.  PMFS History: Patient Active Problem List   Diagnosis Date Noted   Bipolar 1 disorder, mixed, moderate ([place]) 12/06/2021   Insomnia 12/06/2021   Encounter for long-term (current) use of medications 12/06/2021   Unilateral primary osteoarthritis, right hip 11/30/2021   Carpal tunnel syndrome, right upper limb 11/30/2021   Low back pain 10/11/2021   Peripheral neuropathy 10/11/2021   Pain in left shoulder 08/31/2021   Spinal stenosis of cervical region 06/09/2021   Nonischemic cardiomyopathy ([place]) 06/06/2021   Chest pain 06/01/2021    Chest pain of uncertain etiology 27/78/2423   Obesity, Class III, BMI 40-49.9 (morbid obesity) ([place]) 05/31/2021   Diabetes mellitus ([place]) 05/04/2021   Sleep apnea 05/04/2021   Hypertensive urgency 10/19/2020   GERD (gastroesophageal reflux disease) 02/09/2018   Hyperglycemia 02/08/2018   Atypical chest pain 02/08/2018   Hypokalemia 02/08/2018   Leukocytosis 02/08/2018   Nausea and vomiting 02/08/2018   Mood disorder ([place])    Hypertension    Anxiety    Hyperglycemia without ketosis    Manic disorder ([place])    Past Medical History:  Diagnosis Date   Anxiety    Asthma    CHF (congestive heart failure) ([place])    Coronary artery disease    Depression    Diabetes mellitus type 2 in obese ([place])    Hypertension    Manic disorder ([place])    Obesity    Sleep apnea     Family History  Problem Relation Age of Onset   Seizures Brother    Heart failure Neg Hx     Past Surgical History:  Procedure Laterality Date   CARPAL TUNNEL RELEASE Right 12/19/2021   Procedure: RIGHT CARPAL TUNNEL RELEASE;  Surgeon: Marybelle Killings, MD;  Location: [facility];  Service: Orthopedics;  Laterality: Right;   Dental procedure     Social History   Occupational History  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  Incision looks good.  He has 1 suture that came undone.  No drainage or signs of infection.

## 2021-12-26 NOTE — Telephone Encounter (Signed)
Tried to call the patient several times but VM is not set up. Will put CD at front desk for pick up.

## 2021-12-26 NOTE — Telephone Encounter (Signed)
Spoke with patient, he is aware first CD is ready for pickup. I have made CD of shoulder xrays and put it up front with the other, also told him he would have to get MRI from facility where it was done.

## 2021-12-27 ENCOUNTER — Other Ambulatory Visit (HOSPITAL_BASED_OUTPATIENT_CLINIC_OR_DEPARTMENT_OTHER): Payer: Self-pay

## 2021-12-27 MED ORDER — CARVEDILOL 25 MG PO TABS
25.0000 mg | ORAL_TABLET | Freq: Two times a day (BID) | ORAL | 3 refills | Status: DC
  Filled 2021-12-27: qty 60, 30d supply, fill #0
  Filled 2022-02-05: qty 60, 30d supply, fill #1
  Filled 2022-02-28: qty 60, 30d supply, fill #2
  Filled 2022-03-25: qty 60, 30d supply, fill #3

## 2022-01-04 ENCOUNTER — Encounter: Payer: Self-pay | Admitting: Orthopaedic Surgery

## 2022-01-04 ENCOUNTER — Ambulatory Visit (INDEPENDENT_AMBULATORY_CARE_PROVIDER_SITE_OTHER): Payer: Commercial Managed Care - HMO | Admitting: Orthopaedic Surgery

## 2022-01-04 VITALS — Ht 70.0 in | Wt 312.0 lb

## 2022-01-04 DIAGNOSIS — G5601 Carpal tunnel syndrome, right upper limb: Secondary | ICD-10-CM

## 2022-01-04 NOTE — Progress Notes (Signed)
Post-Op Visit Note   Patient: Jordan Sparks           Date of Birth: 04-Nov-1975           MRN: [id number] Visit Date: 01/04/2022 PCP: de [place], Raymond J, MD   Assessment & Plan: Follow-up right carpal tunnel release.  Left hand bothers him some but was not as severe.  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  Chief Complaint:  Chief Complaint  Patient presents with   Right Hand - Routine Post Op, Follow-up    12/19/2021 Right CTR   Visit Diagnoses:  1. Carpal tunnel syndrome, right upper limb     Plan: Recheck 6 weeks.  Incision looks good sutures removed.  Follow-Up Instructions: Return in about 6 weeks (around 02/15/2022).   Orders:  No orders of the defined types were placed in this encounter.  No orders of the defined types were placed in this encounter.   Imaging: No results found.  PMFS History: Patient Active Problem List   Diagnosis Date Noted   Bipolar 1 disorder, mixed, moderate ([place]) 12/06/2021   Insomnia 12/06/2021   Encounter for long-term (current) use of medications 12/06/2021   Unilateral primary osteoarthritis, right hip 11/30/2021   Carpal tunnel syndrome, right upper limb 11/30/2021   Low back pain 10/11/2021   Peripheral neuropathy 10/11/2021   Pain in left shoulder 08/31/2021   Spinal stenosis of cervical region 06/09/2021   Nonischemic cardiomyopathy ([place]) 06/06/2021   Chest pain 06/01/2021   Chest pain of uncertain etiology 63/87/5643   Obesity, Class III, BMI 40-49.9 (morbid obesity) ([place]) 05/31/2021   Diabetes mellitus ([place]) 05/04/2021   Sleep apnea 05/04/2021   Hypertensive urgency 10/19/2020   GERD (gastroesophageal reflux disease) 02/09/2018   Hyperglycemia 02/08/2018   Atypical chest pain 02/08/2018   Hypokalemia 02/08/2018   Leukocytosis 02/08/2018   Nausea and vomiting 02/08/2018   Mood disorder ([place])    Hypertension    Anxiety    Hyperglycemia  without ketosis    Manic disorder ([place])    Past Medical History:  Diagnosis Date   Anxiety    Asthma    CHF (congestive heart failure) ([place])    Coronary artery disease    Depression    Diabetes mellitus type 2 in obese ([place])    Hypertension    Manic disorder ([place])    Obesity    Sleep apnea     Family History  Problem Relation Age of Onset   Seizures Brother    Heart failure Neg Hx     Past Surgical History:  Procedure Laterality Date   CARPAL TUNNEL RELEASE Right 12/19/2021   Procedure: RIGHT CARPAL TUNNEL RELEASE;  Surgeon: Marybelle Killings, MD;  Location: [facility];  Service: Orthopedics;  Laterality: Right;   Dental procedure     Social History   Occupational History   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

## 2022-01-05 ENCOUNTER — Other Ambulatory Visit (HOSPITAL_BASED_OUTPATIENT_CLINIC_OR_DEPARTMENT_OTHER): Payer: Self-pay

## 2022-01-05 ENCOUNTER — Encounter (HOSPITAL_COMMUNITY): Payer: Self-pay | Admitting: Psychiatry

## 2022-01-05 ENCOUNTER — Telehealth (HOSPITAL_BASED_OUTPATIENT_CLINIC_OR_DEPARTMENT_OTHER): Payer: Commercial Managed Care - HMO | Admitting: Psychiatry

## 2022-01-05 DIAGNOSIS — F3162 Bipolar disorder, current episode mixed, moderate: Secondary | ICD-10-CM | POA: Diagnosis not present

## 2022-01-05 DIAGNOSIS — G4700 Insomnia, unspecified: Secondary | ICD-10-CM | POA: Diagnosis not present

## 2022-01-05 DIAGNOSIS — Z79899 Other long term (current) drug therapy: Secondary | ICD-10-CM

## 2022-01-05 MED ORDER — LITHIUM CARBONATE 150 MG PO CAPS
150.0000 mg | ORAL_CAPSULE | Freq: Two times a day (BID) | ORAL | 0 refills | Status: DC
  Filled 2022-01-05: qty 6, 3d supply, fill #0

## 2022-01-05 MED ORDER — DIVALPROEX SODIUM 250 MG PO DR TAB
250.0000 mg | DELAYED_RELEASE_TABLET | Freq: Two times a day (BID) | ORAL | 1 refills | Status: DC
  Filled 2022-01-05: qty 30, 15d supply, fill #0
  Filled 2022-01-25: qty 30, 15d supply, fill #1

## 2022-01-05 MED ORDER — DULOXETINE HCL 60 MG PO CPEP
60.0000 mg | ORAL_CAPSULE | Freq: Two times a day (BID) | ORAL | 0 refills | Status: DC
  Filled 2022-01-06: qty 60, 30d supply, fill #0

## 2022-01-05 MED ORDER — BUPROPION HCL ER (XL) 150 MG PO TB24
150.0000 mg | ORAL_TABLET | ORAL | 1 refills | Status: DC
  Filled 2022-01-06: qty 30, 30d supply, fill #0
  Filled 2022-02-05: qty 30, 30d supply, fill #1

## 2022-01-05 MED ORDER — ARIPIPRAZOLE 5 MG PO TABS
5.0000 mg | ORAL_TABLET | Freq: Every day | ORAL | 1 refills | Status: DC
  Filled 2022-01-06: qty 30, 30d supply, fill #0
  Filled 2022-02-05: qty 30, 30d supply, fill #1

## 2022-01-05 NOTE — Progress Notes (Signed)
BH MD/PA/NP OP Progress Note  01/05/2022 11:22 AM Jordan Sparks  MRN:  [id number]  Visit Diagnosis:    ICD-10-CM   1. Bipolar 1 disorder, mixed, moderate (HCC)  F31.62     2. Insomnia, unspecified type  G47.00     3. Encounter for long-term (current) use of medications  Z79.899       Assessment: Jordan Sparks is a 46 y.o. y.o. male with a history of Depression Bipolar disorder who presented to [facility] for initial evaluation on 12/06/21.    Patient presented at initial evaluation reporting struggling with depressed mood, anhedonia, amotivation, insomnia, and negative self thoughts for the last year. He denied any SI/HI or thoughts of self harm. Patient did endorse experiencing auditory and visual hallucinations multiple times a week which are occasionally command in nature. He has noticed a decrease in the voices since starting Lithium. Of note patient endorsed a history of bipolar disorder including manic episodes where he had decreased sleep, increased energy, increased irritability.  Based on patients past history and symptoms he meets criteria for Bipolar disorder mixed, current episode depressed.   Jordan Sparks presents for follow-up evaluation. Today, 01/05/22, patient reports a marked improvement in his depression, anxiety, and auditory hallucinations.  He still does endorse some low mood, amotivation, and insomnia but feels that his is much less surveillance and more manageable than it was in the past.  He also notes that his brain feels "more relaxed" without the constant disruption of his thoughts/voices.  Have communicated with patient's PCP in the interim about the adverse effects of lithium on his CHF.  Patient is stabilizing with the addition of Abilify we will begin to taper off of lithium and start Depakote for mood stabilization and his place.  Went over the risk and benefits of the transition with the patient and  he was in agreement.  Also discontinued trazodone as patient found it was oversedating.  Patient will follow up in 2 weeks.  Plan:  - Taper Lithium to 150 mg BID for 3 days before discontinuing, due to negative interaction with patients CHF - Start Depakote 250 mg BID - Continue Wellbutrin to XL 150 mg QD - Continue Cymbalta 60 mg BID - Continue Abilify 5 mg QD - Continue Gabapentin 600 QAM and 300 mg QHS managed by PCP for neuropathic pain - Discontinue Trazodone due to oversedation - Discontinued Fluoxetine and BuSpar due to lack of benefit - CBC, CMP WNL - Utox positive for cannabis use - Lipid Panel ordered on 12/06/21 - Crisis resources discussed - Follow up 2 week   Chief Complaint:  Chief Complaint  Patient presents with   Follow-up   HPI: Jordan Sparks presents reporting that the last month has been pretty good.  When asked to elaborate he reports that things are going a lot better mentally and his depression and anxiety have been improving.  He still does endorse some mild symptoms of depression and anxiety but they are much more manageable and minimal in his day to day.  Patient has also felt that his brain has gotten much quieter since he started the Abilify and he feels a lot more relaxed.  Overall he thinks that the medication combo has been working fairly well for him and he does not notice any adverse side effects at this time.  Of note patient has stopped taking the trazodone as he did not like the way it forced him to sleep.  He notes that he does  take a little bit to fall asleep but has slept okay through the night.  He was interested in trying melatonin in the hopes of to be a more gentle method for him to fall asleep.  We discussed patient's desire to discontinue lithium as well as the adverse effects he could have on his CHF.  As patient has started to improve on the Abilify we discussed tapering off the lithium and starting Depakote in its place.  Patient was informed of the  potential side effects and did note that he tends to unravel about 2 weeks after stopping lithium.  We explained with the addition of the Depakote and Abilify there should still be the mood stabilization even without lithium.  We also scheduled appointment for follow-up in 2 weeks to reassess the patient.  Past Psychiatric History: Patient has a past psychiatric history of Bipolar disorder and depression. He has one prior inpatient treatment at [facility] in 2012 or 2013 for depressed mood and irritability. Patient has also been connected with therapists in the past in 2012, 2013, 2014 through [facility].    He has been on Lithium, Prozac, Wellbutrin, BuSpar, Trazodone, and gabapentin in the past. Started on Cymbalta on 12/06/21.   Started Depakote on 01/05/22  Patient reports EtOH use started around age 63 but has decreased to social use now. He endorses occasional marijuana use.  Past Medical History:  Past Medical History:  Diagnosis Date   Anxiety    Asthma    CHF (congestive heart failure) ([place])    Coronary artery disease    Depression    Diabetes mellitus type 2 in obese ([place])    Hypertension    Manic disorder ([place])    Obesity    Sleep apnea     Past Surgical History:  Procedure Laterality Date   CARPAL TUNNEL RELEASE Right 12/19/2021   Procedure: RIGHT CARPAL TUNNEL RELEASE;  Surgeon: Marybelle Killings, MD;  Location: [facility];  Service: Orthopedics;  Laterality: Right;   Dental procedure      Family Psychiatric History: Bipolar disorder in half the men of his family  Family History:  Family History  Problem Relation Age of Onset   Seizures Brother    Heart failure Neg Hx     Social History:  Social History   Socioeconomic History   Marital status: Married    Spouse name: Jordan Sparks   Number of children: 2   Years of education: Not on file   Highest education level: Associate degree: academic program  Occupational History   Occupation: truck Geophysicist/field seismologist    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, Administrator  Tobacco Use   Smoking status: Former    Packs/day: 0.25    Years: 32.00    Total pack years: 8.00    Types: Cigarettes    Quit date: 04/2021    Years since quitting: 0.7   Smokeless tobacco: Never  Vaping Use   Vaping Use: Never used  Substance and Sexual Activity   Alcohol use: Yes    Comment: occasional   Drug use: Yes    Types: Marijuana    Comment: routinely   Sexual activity: Not on file  Other Topics Concern   Not on file  Social History Narrative   Not on file   Social Determinants of Health   Financial Resource Strain: Low Risk  (06/02/2021)   Overall Financial Resource Strain (CARDIA)    Difficulty of Paying Living Expenses: Not hard at all  Food Insecurity:  No Food Insecurity (06/02/2021)   Hunger Vital Sign    Worried About Running Out of Food in the Last Year: Never true    Ran Out of Food in the Last Year: Never true  Transportation Needs: No Transportation Needs (06/02/2021)   PRAPARE - Hydrologist (Medical): No    Lack of Transportation (Non-Medical): No  Physical Activity: Not on file  Stress: Not on file  Social Connections: Not on file    Allergies:  Allergies  Allergen Reactions   Lisinopril Cough    Current Medications: Current Outpatient Medications  Medication Sig Dispense Refill   amLODipine (NORVASC) 5 MG tablet Take 1 tablet (5 mg total) by mouth daily. 30 tablet 1   ARIPiprazole (ABILIFY) 5 MG tablet Take 1 tablet (5 mg total) by mouth daily. 30 tablet 1   aspirin 81 MG chewable tablet Chew 1 tablet (81 mg total) by mouth daily. 30 tablet 11   blood glucose meter kit and supplies KIT Dispense based on patient and insurance preference. Use up to four times daily as directed. (FOR ICD-9 250.00, 250.01). 1 each 0   buPROPion (WELLBUTRIN XL) 150 MG 24 hr tablet Take 1 tablet (150 mg total) by mouth every morning. 30 tablet 1   carvedilol (COREG) 25 MG tablet Take 1 tablet (25  mg total) by mouth 2 (two) times daily. 60 tablet 3   cetirizine (ZYRTEC) 10 MG tablet Take 1 tablet (10 mg total) by mouth daily. 100 tablet 0   dapagliflozin propanediol (FARXIGA) 10 MG TABS tablet Take 1 tablet (10 mg total) by mouth daily. 30 tablet 6   DULoxetine (CYMBALTA) 60 MG capsule Take 1 capsule (60 mg total) by mouth 2 (two) times daily. 60 capsule 0   eplerenone (INSPRA) 25 MG tablet Take 1 tablet (25 mg total) by mouth daily. 30 tablet 3   fluticasone (FLONASE) 50 MCG/ACT nasal spray Place 2 sprays into both nostrils daily as needed for allergies. 16 g 1   furosemide (LASIX) 20 MG tablet Take 1 tablet (20 mg total) by mouth 2 (two) times daily. 60 tablet 1   gabapentin (NEURONTIN) 300 MG capsule Take 1 capsule (300 mg total) by mouth 2 (two) times daily. 60 capsule 0   HYDROcodone-acetaminophen (NORCO) 5-325 MG tablet Take 1-2 tablets by mouth every 4 (four) hours as needed for moderate pain. 30 tablet 0   HYDROcodone-acetaminophen (NORCO/VICODIN) 5-325 MG tablet Take 1 tablet by mouth every 6 (six) hours as needed for moderate pain. 40 tablet 0   lithium carbonate 300 MG capsule Take 1 capsule (300 mg total) by mouth 2 (two) times daily. 60 capsule 0   metFORMIN (GLUCOPHAGE) 1000 MG tablet Take 1 tablet (1,000 mg total) by mouth 2 (two) times daily. 180 tablet 1   Multiple Vitamin (MULTIVITAMIN WITH MINERALS) TABS tablet Take 1 tablet by mouth daily. 100 tablet 0   NON FORMULARY Pt uses a cpap nightly     potassium chloride SA (KLOR-CON M) 20 MEQ tablet Take 2 tablets (40 mEq total) by mouth daily. 60 tablet 3   rosuvastatin (CRESTOR) 20 MG tablet Take 1 tablet (20 mg total) by mouth daily. 90 tablet 3   sacubitril-valsartan (ENTRESTO) 97-103 MG Take 1 tablet by mouth 2 (two) times daily. 60 tablet 6   terbinafine (LAMISIL) 250 MG tablet Take 1 tablet (250 mg total) by mouth daily. 30 tablet 0   traZODone (DESYREL) 50 MG tablet Take 0.5 tablets (25 mg  total) by mouth at bedtime as  needed for sleep (insomnia). 30 tablet 1   Vitamin D, Ergocalciferol, (DRISDOL) 1.25 MG (50000 UNIT) CAPS capsule Take 1 capsule (50,000 Units total) by mouth once a week. 30 capsule 0   No current facility-administered medications for this visit.     Psychiatric Specialty Exam: Review of Systems  There were no vitals taken for this visit.There is no height or weight on file to calculate BMI.  General Appearance: Fairly Groomed  Eye Contact:  Good  Speech:  Clear and Coherent and Normal Rate  Volume:  Normal  Mood:  Euthymic  Affect:  Congruent  Thought Process:  Coherent and Goal Directed  Orientation:  Full (Time, Place, and Person)  Thought Content: Logical   Suicidal Thoughts:  No  Homicidal Thoughts:  No  Memory:  NA  Judgement:  Good  Insight:  Fair  Psychomotor Activity:  Normal  Concentration:  Concentration: Good  Recall:  Good  Fund of Knowledge: Fair  Language: Good  Akathisia:  NA    AIMS (if indicated): not done  Assets:  Communication Skills Desire for Improvement Housing  ADL's:  Intact  Cognition: WNL  Sleep:  Fair   Metabolic Disorder Labs: Lab Results  Component Value Date   HGBA1C 7.0 (H) 08/29/2021   MPG 257.52 02/09/2018   No results found for: "PROLACTIN" Lab Results  Component Value Date   CHOL 189 05/04/2021   TRIG 106 05/04/2021   HDL 54 05/04/2021   CHOLHDL 3.5 05/04/2021   VLDL 37 02/09/2018   LDLCALC 116 (H) 05/04/2021   [place] 81 02/09/2018   Lab Results  Component Value Date   TSH 1.130 05/04/2021    Therapeutic Level Labs: Lab Results  Component Value Date   LITHIUM <0.06 (L) 02/08/2018   No results found for: "VALPROATE" No results found for: "CBMZ"   Screenings: GAD-7    Flowsheet Row Video Visit from 12/09/2021 in [facility] Visit from 08/17/2021 in [facility]  Total GAD-7 Score 0 6      PHQ2-9    [place] Video Visit from 12/09/2021 in [facility] Visit from 12/06/2021 in [facility] Office Visit from 09/29/2021 in [facility] Visit from 08/29/2021 in [facility] Visit from 08/17/2021 in [facility]  PHQ-2 Total Score 0 4 0 1 2  PHQ-9 Total Score 0 11 0 5 7      Flowsheet Row Admission (Discharged) from 12/19/2021 in [facility] from 12/06/2021 in [facility] ED from 09/20/2021 in [facility] No Risk No Risk No Risk       Collaboration of Care: Collaboration of Care: Medication Management AEB medication prescription and Primary Care Provider AEB chart review and communication  Patient/Guardian was advised Release of Information must be obtained prior to any record release in order to collaborate their care with an outside provider. Patient/Guardian was advised if they have not already done so to contact the registration department to sign all necessary forms in order for [place] to release information regarding their care.   Consent: Patient/Guardian gives verbal consent for treatment and assignment of benefits for services provided during this visit. Patient/Guardian expressed understanding and agreed to proceed.  Vista Mink, MD 01/05/2022, 11:22 AM   Virtual Visit via Video Note  I connected with Jordan Sparks on 01/05/22 at  2:30 PM EDT by a video enabled telemedicine application and verified that I am speaking with the correct person using two identifiers.  Location: Patient: Home Provider: Home Office   I discussed the limitations of evaluation and management by telemedicine and the availability of in person appointments. The  patient expressed understanding and agreed to proceed.   I discussed the assessment and treatment plan with the patient. The patient was provided an opportunity to ask questions and all were answered. The patient agreed with the plan and demonstrated an understanding of the instructions.   The patient was advised to call back or seek an in-person evaluation if the symptoms worsen or if the condition fails to improve as anticipated.  I provided 20 minutes of non-face-to-face time during this encounter.   Vista Mink, MD

## 2022-01-06 ENCOUNTER — Other Ambulatory Visit (HOSPITAL_BASED_OUTPATIENT_CLINIC_OR_DEPARTMENT_OTHER): Payer: Self-pay

## 2022-01-11 ENCOUNTER — Other Ambulatory Visit (HOSPITAL_BASED_OUTPATIENT_CLINIC_OR_DEPARTMENT_OTHER): Payer: Self-pay

## 2022-01-11 ENCOUNTER — Other Ambulatory Visit (HOSPITAL_COMMUNITY): Payer: Self-pay | Admitting: Student

## 2022-01-11 ENCOUNTER — Other Ambulatory Visit: Payer: Self-pay | Admitting: Orthopaedic Surgery

## 2022-01-11 ENCOUNTER — Other Ambulatory Visit (HOSPITAL_BASED_OUTPATIENT_CLINIC_OR_DEPARTMENT_OTHER): Payer: Self-pay | Admitting: Family Medicine

## 2022-01-11 MED ORDER — FUROSEMIDE 20 MG PO TABS
20.0000 mg | ORAL_TABLET | Freq: Two times a day (BID) | ORAL | 1 refills | Status: DC
  Filled 2022-01-11: qty 60, 30d supply, fill #0
  Filled 2022-02-15: qty 60, 30d supply, fill #1

## 2022-01-11 MED ORDER — EPLERENONE 25 MG PO TABS
25.0000 mg | ORAL_TABLET | Freq: Every day | ORAL | 1 refills | Status: DC
  Filled 2022-01-11: qty 30, 30d supply, fill #0
  Filled 2022-02-15: qty 90, 90d supply, fill #1
  Filled 2022-08-04 – 2022-09-07 (×5): qty 30, 30d supply, fill #2

## 2022-01-11 NOTE — Telephone Encounter (Signed)
This is Dr. Acharya's pt ?

## 2022-01-12 ENCOUNTER — Encounter (HOSPITAL_BASED_OUTPATIENT_CLINIC_OR_DEPARTMENT_OTHER): Payer: Self-pay | Admitting: Pharmacist

## 2022-01-12 ENCOUNTER — Other Ambulatory Visit (HOSPITAL_BASED_OUTPATIENT_CLINIC_OR_DEPARTMENT_OTHER): Payer: Self-pay

## 2022-01-13 ENCOUNTER — Other Ambulatory Visit (HOSPITAL_BASED_OUTPATIENT_CLINIC_OR_DEPARTMENT_OTHER): Payer: Self-pay

## 2022-01-13 ENCOUNTER — Ambulatory Visit (HOSPITAL_COMMUNITY)
Admission: EM | Admit: 2022-01-13 | Discharge: 2022-01-13 | Disposition: A | Discharge status: Home / Self Care | Payer: Commercial Managed Care - HMO | Attending: Physician Assistant | Admitting: Physician Assistant

## 2022-01-13 ENCOUNTER — Ambulatory Visit (INDEPENDENT_AMBULATORY_CARE_PROVIDER_SITE_OTHER): Payer: Commercial Managed Care - HMO

## 2022-01-13 ENCOUNTER — Encounter (HOSPITAL_COMMUNITY): Payer: Self-pay

## 2022-01-13 DIAGNOSIS — W19XXXA Unspecified fall, initial encounter: Secondary | ICD-10-CM

## 2022-01-13 DIAGNOSIS — S96912A Strain of unspecified muscle and tendon at ankle and foot level, left foot, initial encounter: Secondary | ICD-10-CM | POA: Diagnosis not present

## 2022-01-13 DIAGNOSIS — S99912A Unspecified injury of left ankle, initial encounter: Secondary | ICD-10-CM

## 2022-01-13 DIAGNOSIS — S93402A Sprain of unspecified ligament of left ankle, initial encounter: Secondary | ICD-10-CM | POA: Diagnosis not present

## 2022-01-13 DIAGNOSIS — M25572 Pain in left ankle and joints of left foot: Secondary | ICD-10-CM

## 2022-01-13 MED ORDER — HYDROCODONE-ACETAMINOPHEN 5-325 MG PO TABS
2.0000 | ORAL_TABLET | Freq: Two times a day (BID) | ORAL | 0 refills | Status: AC | PRN
  Filled 2022-01-13: qty 4, 2d supply, fill #0

## 2022-01-13 NOTE — ED Provider Notes (Signed)
[facility]    CSN: [id number] Arrival date & time: 01/13/22  7824      History   Chief Complaint Chief Complaint  Patient presents with   left ankle pain   Fall    HPI Jordan Sparks is a 46 y.o. male.   Patient presents today with a 1 day history of left ankle/foot pain after injury.  Reports that yesterday he was going downstairs when his right leg went out from underneath him causing him to fall and twist his left ankle.  He reports that pain is currently rated 7 on a 0-10 pain scale, localized to lateral left ankle with radiation into left foot, described as aching, no aggravating or alleviating factors identified.  Denies any numbness or paresthesias.  Denies previous injury or surgery involving this ankle.  He has tried Tylenol without improvement of symptoms.  Reports he is unable to bear weight as result of pain.    Past Medical History:  Diagnosis Date   Anxiety    Asthma    CHF (congestive heart failure) ([place])    Coronary artery disease    Depression    Diabetes mellitus type 2 in obese ([place])    Hypertension    Manic disorder ([place])    Obesity    Sleep apnea     Patient Active Problem List   Diagnosis Date Noted   Bipolar 1 disorder, mixed, moderate ([place]) 12/06/2021   Insomnia 12/06/2021   Encounter for long-term (current) use of medications 12/06/2021   Unilateral primary osteoarthritis, right hip 11/30/2021   Carpal tunnel syndrome, right upper limb 11/30/2021   Low back pain 10/11/2021   Peripheral neuropathy 10/11/2021   Pain in left shoulder 08/31/2021   Spinal stenosis of cervical region 06/09/2021   Nonischemic cardiomyopathy ([place]) 06/06/2021   Chest pain 06/01/2021   Chest pain of uncertain etiology 23/53/6144   Obesity, Class III, BMI 40-49.9 (morbid obesity) ([place]) 05/31/2021   Diabetes mellitus ([place]) 05/04/2021   Sleep apnea 05/04/2021   Hypertensive urgency 10/19/2020   GERD (gastroesophageal reflux disease) 02/09/2018    Hyperglycemia 02/08/2018   Atypical chest pain 02/08/2018   Hypokalemia 02/08/2018   Leukocytosis 02/08/2018   Nausea and vomiting 02/08/2018   Mood disorder ([place])    Hypertension    Anxiety    Hyperglycemia without ketosis    Manic disorder ([place])     Past Surgical History:  Procedure Laterality Date   CARPAL TUNNEL RELEASE Right 12/19/2021   Procedure: RIGHT CARPAL TUNNEL RELEASE;  Surgeon: Marybelle Killings, MD;  Location: [facility];  Service: Orthopedics;  Laterality: Right;   Dental procedure         Home Medications    Prior to Admission medications   Medication Sig Start Date End Date Taking? Authorizing Provider  HYDROcodone-acetaminophen (NORCO/VICODIN) 5-325 MG tablet Take 2 tablets by mouth 2 (two) times daily as needed for up to 2 days. 01/13/22 01/15/22 Yes Aero Drummonds K, PA-C  amLODipine (NORVASC) 5 MG tablet Take 1 tablet (5 mg total) by mouth daily. 12/26/21   [place], Blondell Reveal, MD  ARIPiprazole (ABILIFY) 5 MG tablet Take 1 tablet (5 mg total) by mouth daily. 01/05/22 01/05/23  Vista Mink, MD  aspirin 81 MG chewable tablet Chew 1 tablet (81 mg total) by mouth daily. 06/03/21   Little Ishikawa, MD  blood glucose meter kit and supplies KIT Dispense based on patient and insurance preference. Use up to four times daily as directed. (  FOR ICD-9 250.00, 250.01). 06/28/21   [place], Blondell Reveal, MD  buPROPion (WELLBUTRIN XL) 150 MG 24 hr tablet Take 1 tablet (150 mg total) by mouth every morning. 01/05/22 01/05/23  Vista Mink, MD  carvedilol (COREG) 25 MG tablet Take 1 tablet (25 mg total) by mouth 2 (two) times daily. 12/27/21 03/27/22  Darreld Mclean, PA-C  cetirizine (ZYRTEC) 10 MG tablet Take 1 tablet (10 mg total) by mouth daily. 12/26/21   [place], Raymond J, MD  dapagliflozin propanediol (FARXIGA) 10 MG TABS tablet Take 1 tablet (10 mg total) by mouth daily. 08/29/21   [place], Blondell Reveal, MD  divalproex (DEPAKOTE) 250 MG DR tablet Take 1 tablet  (250 mg total) by mouth 2 (two) times daily. Start Depakote 250 mg twice a day when you decrease lithium to 150 mg twice a day and continue this dose after stopping lithium 01/05/22 01/05/23  Vista Mink, MD  DULoxetine (CYMBALTA) 60 MG capsule Take 1 capsule (60 mg total) by mouth 2 (two) times daily. 01/05/22 01/05/23  Vista Mink, MD  eplerenone (INSPRA) 25 MG tablet Take 1 tablet (25 mg total) by mouth daily. Please contact the office to schedule appointment for additional refills. 01/11/22   Elouise Munroe, MD  fluticasone (FLONASE) 50 MCG/ACT nasal spray Place 2 sprays into both nostrils daily as needed for allergies. 12/16/21   [place], Blondell Reveal, MD  furosemide (LASIX) 20 MG tablet Take 1 tablet (20 mg total) by mouth 2 (two) times daily. 01/11/22   [place], Raymond J, MD  gabapentin (NEURONTIN) 300 MG capsule Take 1 capsule (300 mg total) by mouth 2 (two) times daily. 12/26/21   [place], Raymond J, MD  lithium carbonate 150 MG capsule Take 1 capsule (150 mg total) by mouth 2 (two) times daily for 3 days, then discontinue. 01/05/22   Vista Mink, MD  metFORMIN (GLUCOPHAGE) 1000 MG tablet Take 1 tablet (1,000 mg total) by mouth 2 (two) times daily. 10/31/21   [place], Raymond J, MD  Multiple Vitamin (MULTIVITAMIN WITH MINERALS) TABS tablet Take 1 tablet by mouth daily. 08/17/21   [place], Blondell Reveal, MD  NON FORMULARY Pt uses a cpap nightly    [provider]  potassium chloride SA (KLOR-CON M) 20 MEQ tablet Take 2 tablets (40 mEq total) by mouth daily. 09/23/21   Larey Dresser, MD  rosuvastatin (CRESTOR) 20 MG tablet Take 1 tablet (20 mg total) by mouth daily. 12/07/21   Sande Rives E, PA-C  sacubitril-valsartan (ENTRESTO) 97-103 MG Take 1 tablet by mouth 2 (two) times daily. 06/14/21   Joette Catching, PA-C  terbinafine (LAMISIL) 250 MG tablet Take 1 tablet (250 mg total) by mouth daily. 11/11/21   Inocencio Homes, DPM  Vitamin D, Ergocalciferol, (DRISDOL) 1.25 MG (50000  UNIT) CAPS capsule Take 1 capsule (50,000 Units total) by mouth once a week. 08/17/21   [place], Raymond J, MD    Family History Family History  Problem Relation Age of Onset   Seizures Brother    Heart failure Neg Hx     Social History Social History   Tobacco Use   Smoking status: Former    Packs/day: 0.25    Years: 32.00    Total pack years: 8.00    Types: Cigarettes    Quit date: 04/2021    Years since quitting: 0.7   Smokeless tobacco: Never  Vaping Use   Vaping Use: Never used  Substance Use Topics   Alcohol use: Yes    Comment: occasional   Drug use: Yes    Types: Marijuana    Comment: routinely     Allergies   Lisinopril   Review of Systems Review of Systems  Constitutional:  Positive for activity change. Negative for appetite change, fatigue and fever.  Gastrointestinal:  Negative for abdominal pain, diarrhea, nausea and vomiting.  Musculoskeletal:  Positive for arthralgias, gait problem and joint swelling. Negative for back pain and myalgias.  Skin:  Negative for wound.  Neurological:  Negative for dizziness, weakness, light-headedness, numbness and headaches.     Physical Exam Triage Vital Signs ED Triage Vitals [01/13/22 0825]  Enc Vitals Group     BP (!) 159/89     Pulse Rate 89     Resp 16     Temp 98.7 F (37.1 C)     Temp Source Oral     SpO2 98 %     Weight      Height      Head Circumference      Peak Flow      Pain Score      Pain Loc      Pain Edu?      Excl. in [place]?    No data found.  Updated Vital Signs BP (!) 159/89 (BP Location: Left Arm)   Pulse 89   Temp 98.7 F (37.1 C) (Oral)   Resp 16   SpO2 98%   Visual Acuity Right Eye Distance:   Left Eye Distance:   Bilateral Distance:    Right Eye Near:   Left Eye Near:    Bilateral Near:     Physical Exam Vitals reviewed.  Constitutional:      General: He is awake.     Appearance: Normal appearance. He is well-developed. He is not ill-appearing.     Comments:  Very pleasant male appears stated age in no acute distress sitting comfortably in exam room in a wheelchair  HENT:     Head: Normocephalic and atraumatic.  Cardiovascular:     Rate and Rhythm: Normal rate and regular rhythm.     Pulses:          Posterior tibial pulses are 2+ on the left side.     Heart sounds: Normal heart sounds, S1 normal and S2 normal. No murmur heard. Pulmonary:     Effort: Pulmonary effort is normal.     Breath sounds: Normal breath sounds. No stridor. No wheezing, rhonchi or rales.     Comments: Clear to auscultation bilaterally Musculoskeletal:     Left ankle: Swelling present. Tenderness present over the lateral malleolus. Normal range of motion.     Left Achilles Tendon: No tenderness.     Left foot: Normal range of motion and normal capillary refill. Tenderness and bony tenderness present. No swelling.     Comments: Left ankle/foot: Tenderness palpation over lateral malleolus and along fifth metatarsal tarsal.  No deformity noted.  Mild soft tissue swelling.  Foot neurovascularly intact.  Decreased range of motion with inversion as well as plantar/dorsiflexion secondary to pain.  Neurological:     Mental Status: He is alert.  Psychiatric:        Behavior: Behavior is cooperative.      UC Treatments / Results  Labs (all labs ordered are listed, but only abnormal results are displayed) Labs Reviewed - No data to display  EKG   Radiology DG Foot Complete Left  Result  Date: 01/13/2022 CLINICAL DATA:  pain at lateral mallelous after fall EXAM: LEFT FOOT - COMPLETE 3+ VIEW COMPARISON:  None Available. FINDINGS: There is no evidence of acute fracture. Alignment is normal. There is no significant arthritis. Mild soft tissue swelling. IMPRESSION: Mild soft tissue swelling.  No evidence of acute fracture. Electronically Signed   By: Maurine Simmering M.D.   On: 01/13/2022 09:12   DG Ankle Complete Left  Result Date: 01/13/2022 CLINICAL DATA:  pain at lateral  mallelous after fall EXAM: LEFT ANKLE COMPLETE - 3+ VIEW COMPARISON:  None Available. FINDINGS: There is no evidence of acute fracture. Alignment is normal. There is mild ankle soft tissue swelling. There is mild tibiotalar osteoarthritis. IMPRESSION: Ankle soft tissue swelling without evidence of acute fracture. Mild tibiotalar arthritis. Electronically Signed   By: Maurine Simmering M.D.   On: 01/13/2022 09:07    Procedures Procedures (including critical care time)  Medications Ordered in UC Medications - No data to display  Initial Impression / Assessment and Plan / UC Course  I have reviewed the triage vital signs and the nursing notes.  Pertinent labs & imaging results that were available during my care of the patient were reviewed by me and considered in my medical decision making (see chart for details).     X-rays of ankle and foot obtained given bony tenderness based on [place] ankle rules.  X-rays showed no acute fracture but did show some arthritis.  Discussed likely sprain as etiology of symptoms.  Recommended RICE protocol.  Patient reports pain is severe and he is having difficulty ambulating so was placed in a cam boot and given crutches for comfort and support.  Discussed that given severity of symptoms he should follow-up with podiatry and was given contact information for local provider with instruction to call to schedule an appointment.  He is unable to take NSAIDs regularly due to history of cardiovascular disease so was given a couple doses of hydrocodone to help with pain.  Discussed that this is sedating as well as potentially addictive and he should limit use of medication is much as possible.  He is not to drive or drink alcohol with taking this medicine.  Discussed that if his symptoms worsen in any way he is to be seen immediately.  Strict return precautions given.  Patient declined work excuse note.  Final Clinical Impressions(s) / UC Diagnoses   Final diagnoses:  Sprain and  strain of left ankle  Injury of left ankle, initial encounter  Fall, initial encounter     Discharge Instructions      Your x-rays did not show any broken bones which is great news.  I am concerned that you sprained your ankle and heard a ligament.  Please keep this elevated and use ice as well as compression.  Since you are having trouble bearing weight and have significant pain we have put you in a cam boot and use crutches.  I would like you to follow-up with podiatry as soon as possible; call to schedule an appointment.  I have called in a few doses of hydrocodone.  This can be sedating and addicting so please limit use of this medication is much as possible.  You should not drive or drink alcohol with this medication as drowsiness is a common side effect.  If anything changes or worsens including numbness or tingling in the foot, increased swelling, increased pain you need to go to the emergency room.     ED Prescriptions  Medication Sig Dispense Auth. Provider   HYDROcodone-acetaminophen (NORCO/VICODIN) 5-325 MG tablet Take 2 tablets by mouth 2 (two) times daily as needed for up to 2 days. 4 tablet Ahren Pettinger K, PA-C      I have reviewed the PDMP during this encounter.   Terrilee Croak, PA-C 01/13/22 1000

## 2022-01-13 NOTE — ED Triage Notes (Signed)
Pt reports he fell downstairs yesterday.  Pt reports left ankle pain and leg pain.

## 2022-01-13 NOTE — Discharge Instructions (Signed)
Your x-rays did not show any broken bones which is great news.  I am concerned that you sprained your ankle and heard a ligament.  Please keep this elevated and use ice as well as compression.  Since you are having trouble bearing weight and have significant pain we have put you in a cam boot and use crutches.  I would like you to follow-up with podiatry as soon as possible; call to schedule an appointment.  I have called in a few doses of hydrocodone.  This can be sedating and addicting so please limit use of this medication is much as possible.  You should not drive or drink alcohol with this medication as drowsiness is a common side effect.  If anything changes or worsens including numbness or tingling in the foot, increased swelling, increased pain you need to go to the emergency room.

## 2022-01-16 ENCOUNTER — Other Ambulatory Visit (HOSPITAL_BASED_OUTPATIENT_CLINIC_OR_DEPARTMENT_OTHER): Payer: Self-pay

## 2022-01-18 ENCOUNTER — Other Ambulatory Visit (HOSPITAL_BASED_OUTPATIENT_CLINIC_OR_DEPARTMENT_OTHER): Payer: Self-pay

## 2022-01-20 ENCOUNTER — Other Ambulatory Visit (HOSPITAL_BASED_OUTPATIENT_CLINIC_OR_DEPARTMENT_OTHER): Payer: Self-pay

## 2022-01-25 ENCOUNTER — Other Ambulatory Visit (HOSPITAL_COMMUNITY): Payer: Self-pay | Admitting: Cardiology

## 2022-01-25 ENCOUNTER — Other Ambulatory Visit (HOSPITAL_BASED_OUTPATIENT_CLINIC_OR_DEPARTMENT_OTHER): Payer: Self-pay | Admitting: Family Medicine

## 2022-01-25 ENCOUNTER — Other Ambulatory Visit (HOSPITAL_BASED_OUTPATIENT_CLINIC_OR_DEPARTMENT_OTHER): Payer: Self-pay

## 2022-01-26 ENCOUNTER — Other Ambulatory Visit (HOSPITAL_COMMUNITY): Payer: Self-pay

## 2022-01-26 MED ORDER — POTASSIUM CHLORIDE CRYS ER 20 MEQ PO TBCR
40.0000 meq | EXTENDED_RELEASE_TABLET | Freq: Every day | ORAL | 3 refills | Status: DC
  Filled 2022-01-26: qty 60, 30d supply, fill #0
  Filled 2022-02-22: qty 60, 30d supply, fill #1
  Filled 2022-03-25: qty 60, 30d supply, fill #2
  Filled 2022-04-21 – 2022-08-05 (×8): qty 60, 30d supply, fill #3
  Filled 2022-09-07: qty 60, 30d supply, fill #0
  Filled 2022-09-07: qty 60, 30d supply, fill #3

## 2022-01-26 MED ORDER — GABAPENTIN 300 MG PO CAPS
300.0000 mg | ORAL_CAPSULE | Freq: Two times a day (BID) | ORAL | 0 refills | Status: DC
  Filled 2022-01-26: qty 60, 30d supply, fill #0

## 2022-01-27 ENCOUNTER — Other Ambulatory Visit (HOSPITAL_COMMUNITY): Payer: Self-pay

## 2022-01-27 ENCOUNTER — Other Ambulatory Visit (HOSPITAL_COMMUNITY): Payer: Self-pay | Admitting: Psychiatry

## 2022-01-27 DIAGNOSIS — F3162 Bipolar disorder, current episode mixed, moderate: Secondary | ICD-10-CM

## 2022-01-28 ENCOUNTER — Other Ambulatory Visit (HOSPITAL_COMMUNITY): Payer: Self-pay

## 2022-01-30 ENCOUNTER — Other Ambulatory Visit (HOSPITAL_COMMUNITY): Payer: Self-pay

## 2022-01-31 ENCOUNTER — Ambulatory Visit: Payer: Commercial Managed Care - HMO | Admitting: Orthopaedic Surgery

## 2022-02-05 ENCOUNTER — Other Ambulatory Visit (HOSPITAL_COMMUNITY): Payer: Self-pay

## 2022-02-05 ENCOUNTER — Other Ambulatory Visit (HOSPITAL_BASED_OUTPATIENT_CLINIC_OR_DEPARTMENT_OTHER): Payer: Self-pay | Admitting: Family Medicine

## 2022-02-05 ENCOUNTER — Other Ambulatory Visit (HOSPITAL_COMMUNITY): Payer: Self-pay | Admitting: Psychiatry

## 2022-02-05 DIAGNOSIS — F3162 Bipolar disorder, current episode mixed, moderate: Secondary | ICD-10-CM

## 2022-02-06 ENCOUNTER — Other Ambulatory Visit (HOSPITAL_COMMUNITY): Payer: Self-pay

## 2022-02-06 MED ORDER — DIVALPROEX SODIUM 250 MG PO DR TAB
250.0000 mg | DELAYED_RELEASE_TABLET | Freq: Two times a day (BID) | ORAL | 1 refills | Status: DC
  Filled 2022-02-06: qty 30, 15d supply, fill #0
  Filled 2022-02-22: qty 30, 15d supply, fill #1

## 2022-02-06 MED ORDER — FLUTICASONE PROPIONATE 50 MCG/ACT NA SUSP
2.0000 | Freq: Every day | NASAL | 1 refills | Status: DC | PRN
  Filled 2022-02-06: qty 16, 30d supply, fill #0
  Filled 2022-04-09 – 2022-09-07 (×6): qty 16, 30d supply, fill #1
  Filled 2022-09-07: qty 16, 30d supply, fill #0

## 2022-02-06 MED ORDER — DULOXETINE HCL 60 MG PO CPEP
60.0000 mg | ORAL_CAPSULE | Freq: Two times a day (BID) | ORAL | 0 refills | Status: DC
  Filled 2022-02-06: qty 60, 30d supply, fill #0

## 2022-02-07 ENCOUNTER — Other Ambulatory Visit (HOSPITAL_BASED_OUTPATIENT_CLINIC_OR_DEPARTMENT_OTHER): Payer: Self-pay

## 2022-02-09 ENCOUNTER — Ambulatory Visit: Payer: Commercial Managed Care - HMO | Admitting: Orthopaedic Surgery

## 2022-02-15 ENCOUNTER — Encounter: Payer: Self-pay | Admitting: Orthopaedic Surgery

## 2022-02-15 ENCOUNTER — Other Ambulatory Visit (HOSPITAL_COMMUNITY): Payer: Self-pay

## 2022-02-15 ENCOUNTER — Ambulatory Visit (INDEPENDENT_AMBULATORY_CARE_PROVIDER_SITE_OTHER): Payer: Commercial Managed Care - HMO | Admitting: Orthopaedic Surgery

## 2022-02-15 VITALS — BP 167/101 | HR 86 | Ht 70.0 in | Wt 312.0 lb

## 2022-02-15 DIAGNOSIS — G5601 Carpal tunnel syndrome, right upper limb: Secondary | ICD-10-CM

## 2022-02-15 NOTE — Progress Notes (Signed)
Post-Op Visit Note   Patient: Jordan Sparks           Date of Birth: May 27, 1975           MRN: [id number] Visit Date: 02/15/2022 PCP: [place], Raymond J, MD   Assessment & Plan:.Post right carpal tunnel release and is not waking up at night and he does not have any tingling in his fingers.  Opposite left hand had similar carpal tunnel and he just started a new job.  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  He is not sure how long he has to work for he can take some time and get the left hand done.  He will call when he is ready to schedule.  He is happy with the results of the right carpal tunnel release.  Chief Complaint:  Chief Complaint  Patient presents with   Right Hand - Follow-up    12/19/2021 Right CTR   Visit Diagnoses:  1. Carpal tunnel syndrome, right upper limb     Plan: Patient to call when he is ready to schedule the left carpal tunnel release.  Electrical test were positive he is worn night splints taken anti-inflammatories and has had persistent symptoms.  Right hand had surgery 12/19/2021 and is doing well.  Follow-Up Instructions: No follow-ups on file.   Orders:  No orders of the defined types were placed in this encounter.  No orders of the defined types were placed in this encounter.   Imaging: No results found.  PMFS History: Patient Active Problem List   Diagnosis Date Noted   Bipolar 1 disorder, mixed, moderate ([facility]) 12/06/2021   Insomnia 12/06/2021   Encounter for long-term (current) use of medications 12/06/2021   Unilateral primary osteoarthritis, right hip 11/30/2021   Carpal tunnel syndrome, right upper limb 11/30/2021   Low back pain 10/11/2021   Peripheral neuropathy 10/11/2021   Pain in left shoulder 08/31/2021   Spinal stenosis of cervical region 06/09/2021   Nonischemic cardiomyopathy ([facility]) 06/06/2021   Chest pain 06/01/2021   Chest pain of uncertain etiology 05/31/2021   Obesity, Class III, BMI 40-49.9  (morbid obesity) ([facility]) 05/31/2021   Diabetes mellitus ([facility]) 05/04/2021   Sleep apnea 05/04/2021   Hypertensive urgency 10/19/2020   GERD (gastroesophageal reflux disease) 02/09/2018   Hyperglycemia 02/08/2018   Atypical chest pain 02/08/2018   Hypokalemia 02/08/2018   Leukocytosis 02/08/2018   Nausea and vomiting 02/08/2018   Mood disorder ([facility]CC)    Hypertension    Anxiety    Hyperglycemia without ketosis    Manic disorder ([facility])    Past Medical History:  Diagnosis Date   Anxiety    Asthma    CHF (congestive heart failure) ([facility])    Coronary artery disease    Depression    Diabetes mellitus type 2 in obese ([facility])    Hypertension    Manic disorder ([facility])    Obesity    Sleep apnea     Family History  Problem Relation Age of Onset   Seizures Brother    Heart failure Neg Hx     Past Surgical History:  Procedure Laterality Date   CARPAL TUNNEL RELEASE Right 12/19/2021   Procedure: RIGHT CARPAL TUNNEL RELEASE;  Surgeon: Eldred Manges, MD;  Location: [facility];  Service: Orthopedics;  Laterality: Right;   Dental procedure     Social History   Occupational History   Occupation: truck driver    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, [employer]  Tobacco  Use   Smoking status: Former    Packs/day: 0.25    Years: 32.00    Total pack years: 8.00    Types: Cigarettes    Quit date: 04/2021    Years since quitting: 0.8   Smokeless tobacco: Never  Vaping Use   Vaping Use: Never used  Substance and Sexual Activity   Alcohol use: Yes    Comment: occasional   Drug use: Yes    Types: Marijuana    Comment: routinely   Sexual activity: Not on file

## 2022-02-16 ENCOUNTER — Other Ambulatory Visit (HOSPITAL_COMMUNITY): Payer: Self-pay

## 2022-02-17 ENCOUNTER — Other Ambulatory Visit (HOSPITAL_BASED_OUTPATIENT_CLINIC_OR_DEPARTMENT_OTHER): Payer: Self-pay

## 2022-02-21 ENCOUNTER — Other Ambulatory Visit (HOSPITAL_BASED_OUTPATIENT_CLINIC_OR_DEPARTMENT_OTHER): Payer: Self-pay

## 2022-02-22 ENCOUNTER — Other Ambulatory Visit (HOSPITAL_COMMUNITY): Payer: Self-pay

## 2022-02-22 ENCOUNTER — Other Ambulatory Visit: Payer: Self-pay

## 2022-02-22 ENCOUNTER — Other Ambulatory Visit (HOSPITAL_BASED_OUTPATIENT_CLINIC_OR_DEPARTMENT_OTHER): Payer: Self-pay | Admitting: Family Medicine

## 2022-02-22 DIAGNOSIS — I10 Essential (primary) hypertension: Secondary | ICD-10-CM

## 2022-02-22 MED ORDER — GABAPENTIN 300 MG PO CAPS
300.0000 mg | ORAL_CAPSULE | Freq: Two times a day (BID) | ORAL | 0 refills | Status: DC
  Filled 2022-02-22: qty 60, 30d supply, fill #0

## 2022-02-22 MED ORDER — AMLODIPINE BESYLATE 5 MG PO TABS
5.0000 mg | ORAL_TABLET | Freq: Every day | ORAL | 1 refills | Status: DC
  Filled 2022-02-22: qty 30, 30d supply, fill #0
  Filled 2022-03-25: qty 30, 30d supply, fill #1

## 2022-02-28 ENCOUNTER — Other Ambulatory Visit (HOSPITAL_BASED_OUTPATIENT_CLINIC_OR_DEPARTMENT_OTHER): Payer: Self-pay | Admitting: Family Medicine

## 2022-02-28 ENCOUNTER — Other Ambulatory Visit: Payer: Self-pay

## 2022-02-28 ENCOUNTER — Other Ambulatory Visit (HOSPITAL_COMMUNITY): Payer: Self-pay

## 2022-02-28 ENCOUNTER — Other Ambulatory Visit (HOSPITAL_COMMUNITY): Payer: Self-pay | Admitting: Physician Assistant

## 2022-02-28 MED ORDER — ENTRESTO 97-103 MG PO TABS
1.0000 | ORAL_TABLET | Freq: Two times a day (BID) | ORAL | 6 refills | Status: DC
  Filled 2022-02-28: qty 60, 30d supply, fill #0
  Filled 2022-03-25 – 2022-08-05 (×7): qty 60, 30d supply, fill #1
  Filled 2022-09-07: qty 60, 30d supply, fill #0
  Filled 2022-09-07 – 2022-11-30 (×5): qty 60, 30d supply, fill #1

## 2022-02-28 MED ORDER — METFORMIN HCL 1000 MG PO TABS
1000.0000 mg | ORAL_TABLET | Freq: Two times a day (BID) | ORAL | 1 refills | Status: DC
  Filled 2022-02-28 – 2022-05-03 (×5): qty 180, 90d supply, fill #0
  Filled 2022-08-04 – 2022-08-05 (×3): qty 60, 30d supply, fill #0
  Filled 2022-09-07: qty 180, 90d supply, fill #0

## 2022-03-14 ENCOUNTER — Other Ambulatory Visit (HOSPITAL_COMMUNITY): Payer: Self-pay | Admitting: Psychiatry

## 2022-03-14 ENCOUNTER — Other Ambulatory Visit (HOSPITAL_BASED_OUTPATIENT_CLINIC_OR_DEPARTMENT_OTHER): Payer: Self-pay | Admitting: Family Medicine

## 2022-03-14 DIAGNOSIS — F3162 Bipolar disorder, current episode mixed, moderate: Secondary | ICD-10-CM

## 2022-03-15 ENCOUNTER — Other Ambulatory Visit (HOSPITAL_BASED_OUTPATIENT_CLINIC_OR_DEPARTMENT_OTHER): Payer: Self-pay

## 2022-03-15 ENCOUNTER — Other Ambulatory Visit: Payer: Self-pay

## 2022-03-15 ENCOUNTER — Other Ambulatory Visit (HOSPITAL_COMMUNITY): Payer: Self-pay

## 2022-03-15 ENCOUNTER — Other Ambulatory Visit (HOSPITAL_COMMUNITY): Payer: Self-pay | Admitting: Psychiatry

## 2022-03-15 DIAGNOSIS — F3162 Bipolar disorder, current episode mixed, moderate: Secondary | ICD-10-CM

## 2022-03-15 MED ORDER — DIVALPROEX SODIUM 250 MG PO DR TAB
250.0000 mg | DELAYED_RELEASE_TABLET | Freq: Two times a day (BID) | ORAL | 1 refills | Status: DC
  Filled 2022-03-15 – 2022-03-25 (×2): qty 60, 30d supply, fill #0
  Filled 2022-04-21 – 2022-05-03 (×5): qty 60, 30d supply, fill #1

## 2022-03-15 MED ORDER — ARIPIPRAZOLE 5 MG PO TABS
5.0000 mg | ORAL_TABLET | Freq: Every day | ORAL | 1 refills | Status: DC
  Filled 2022-03-15: qty 30, 30d supply, fill #0
  Filled 2022-04-09 – 2022-05-02 (×5): qty 30, 30d supply, fill #1

## 2022-03-15 MED ORDER — DIVALPROEX SODIUM 250 MG PO DR TAB
250.0000 mg | DELAYED_RELEASE_TABLET | Freq: Two times a day (BID) | ORAL | 1 refills | Status: DC
  Filled 2022-03-15: qty 30, 15d supply, fill #0

## 2022-03-15 MED ORDER — FUROSEMIDE 20 MG PO TABS
20.0000 mg | ORAL_TABLET | Freq: Two times a day (BID) | ORAL | 1 refills | Status: DC
  Filled 2022-03-15: qty 60, 30d supply, fill #0
  Filled 2022-04-14 – 2022-09-07 (×9): qty 60, 30d supply, fill #1
  Filled 2022-09-07 – 2022-11-30 (×2): qty 60, 30d supply, fill #0

## 2022-03-15 MED ORDER — DULOXETINE HCL 60 MG PO CPEP
60.0000 mg | ORAL_CAPSULE | Freq: Two times a day (BID) | ORAL | 0 refills | Status: DC
  Filled 2022-03-15: qty 60, 30d supply, fill #0

## 2022-03-15 MED ORDER — BUPROPION HCL ER (XL) 150 MG PO TB24
150.0000 mg | ORAL_TABLET | ORAL | 1 refills | Status: DC
  Filled 2022-03-15: qty 30, 30d supply, fill #0
  Filled 2022-04-09 – 2022-05-03 (×6): qty 30, 30d supply, fill #1

## 2022-03-15 NOTE — Telephone Encounter (Signed)
Lithium was discontinued

## 2022-03-21 IMAGING — CT CT ANGIO CHEST
2 of 6 series · 18 of 36 positions shown · IV contrast (agent unspecified)
Comparison: June 18, 2020.

CLINICAL DATA: Chest pain.

EXAM:
CT ANGIOGRAPHY CHEST WITH CONTRAST
TECHNIQUE: Multidetector CT imaging of the chest was performed using the
standard protocol during bolus administration of intravenous
contrast. Multiplanar CT image reconstructions and MIPs were
obtained to evaluate the vascular anatomy.

[Series 7: pe thins · axial · 0.98mm/px · z∈[+1150,+1442]mm · 17 of 464 slices shown]
[im 24/464  lung]
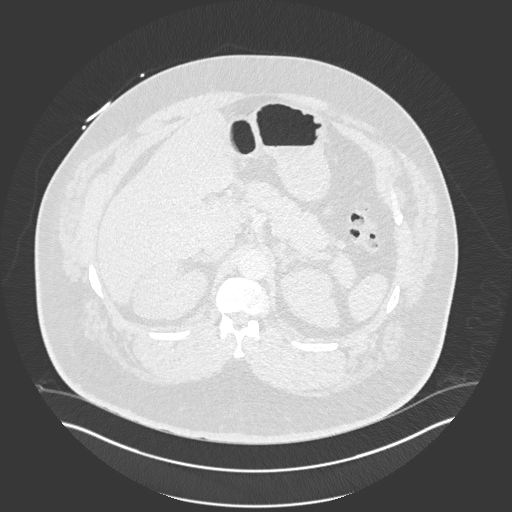
[im 47/464  mediastinal]
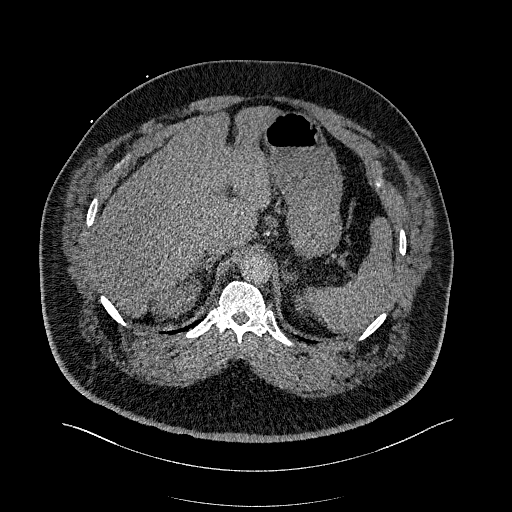
[im 70/464  lung]
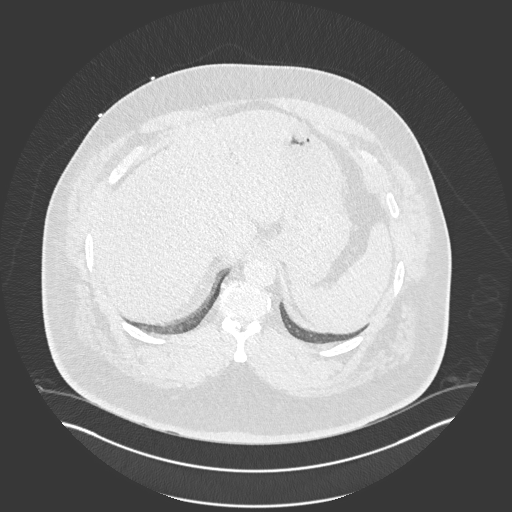
[im 93/464  mediastinal]
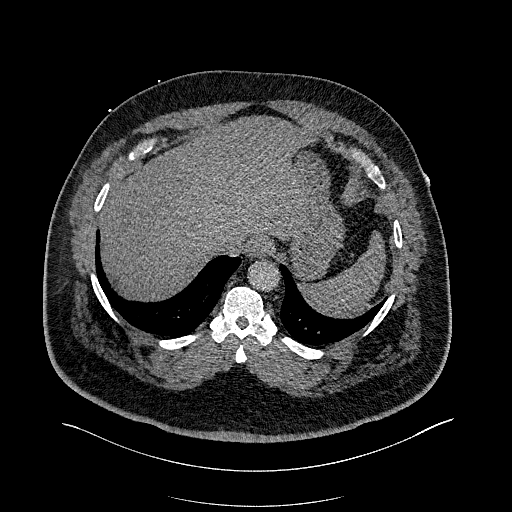
[im 139/464  lung]
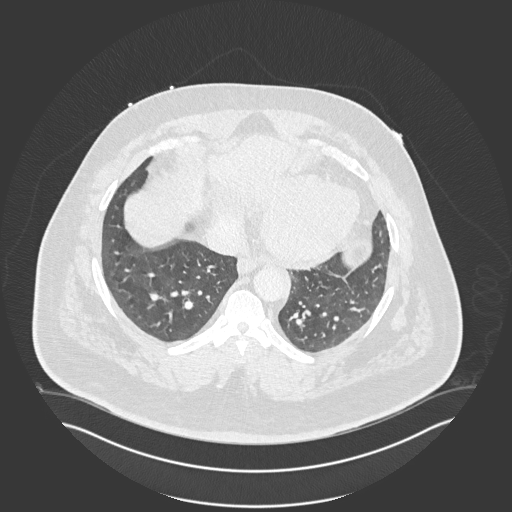
[im 163/464  mediastinal]
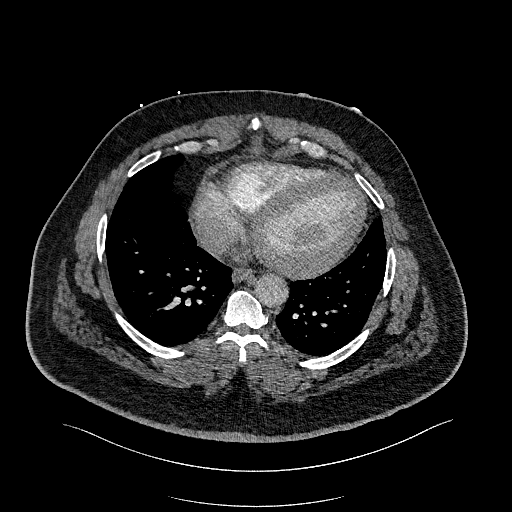
[im 186/464  lung]
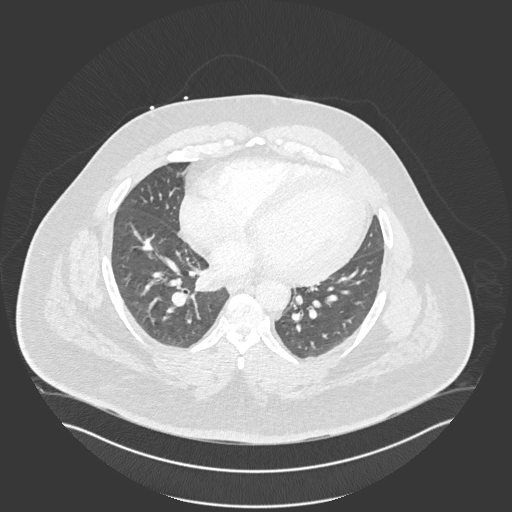
[im 209/464  mediastinal]
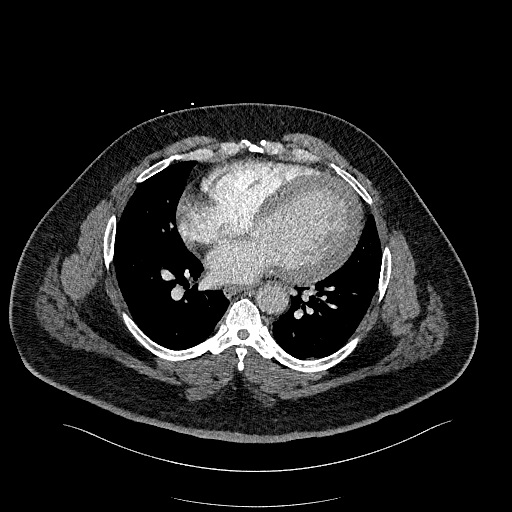
[im 232/464  lung]
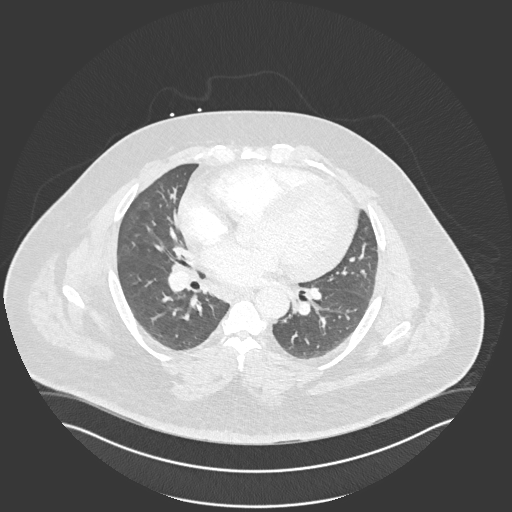
[im 255/464  mediastinal]
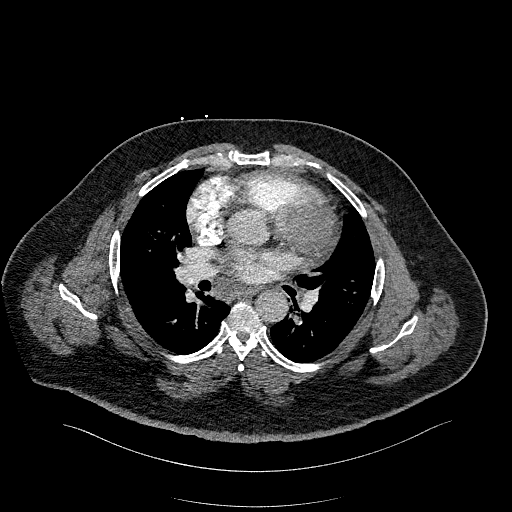
[im 278/464  lung]
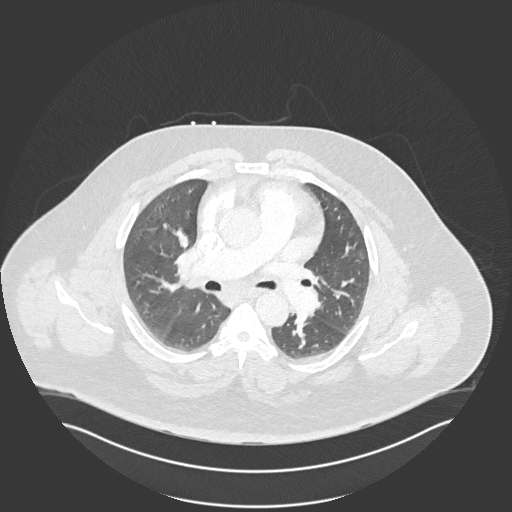
[im 301/464  mediastinal]
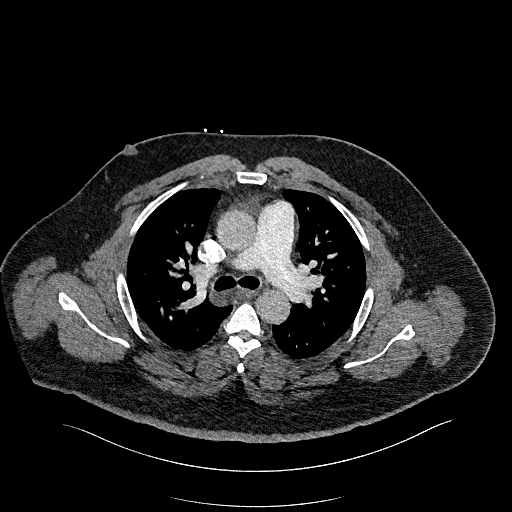
[im 325/464  lung]
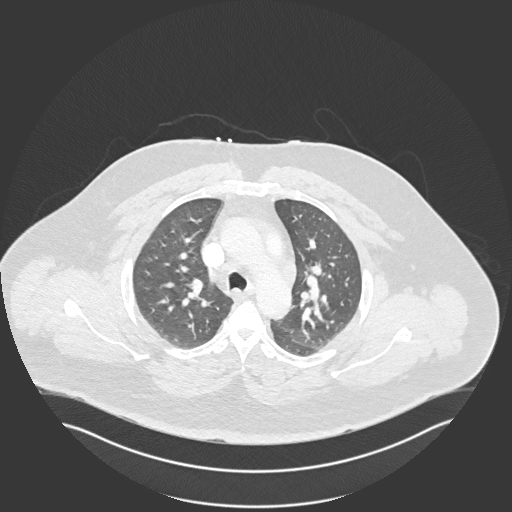
[im 371/464  mediastinal]
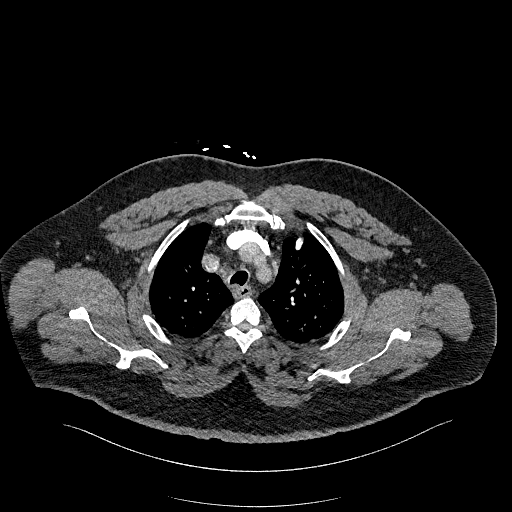
[im 394/464  lung]
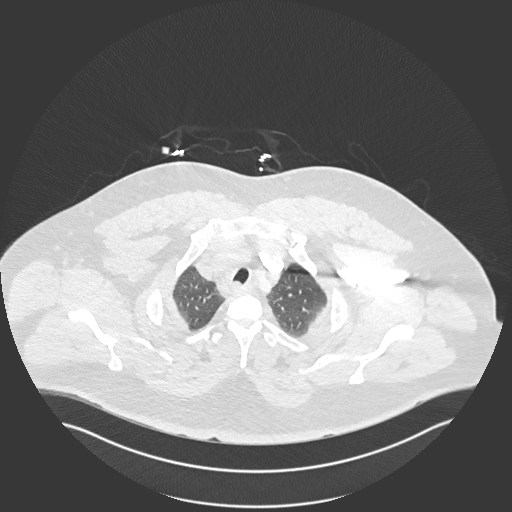
[im 417/464  mediastinal]
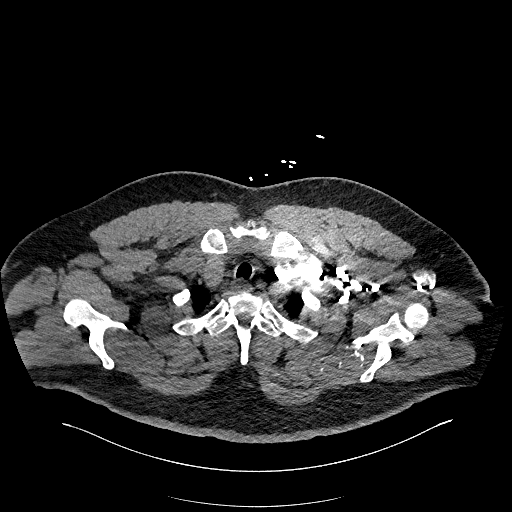
[im 440/464  lung]
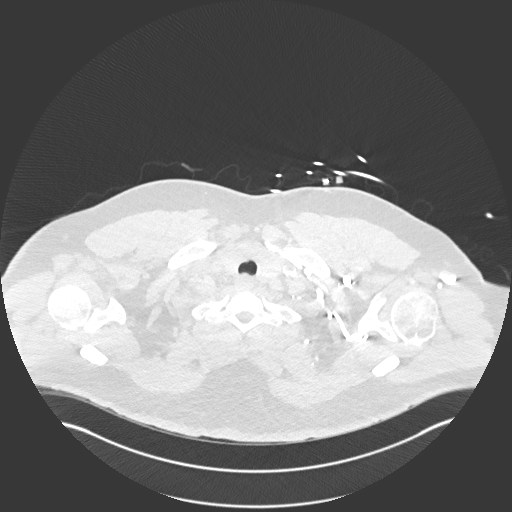

[Series 8: pe 2mm cor · coronal · 0.63mm/px · 1 of 151 slices shown]
[im 76/151  mediastinal]
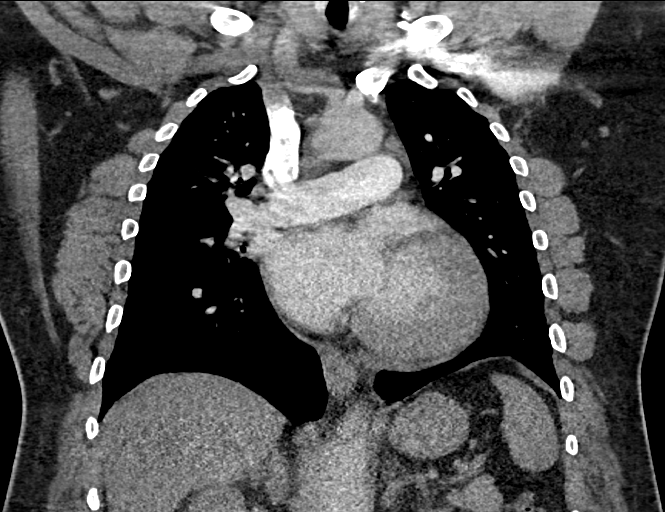

[18 of 36 positions shown; findings below may reference images not displayed]

RADIATION DOSE REDUCTION: This exam was performed according to the
departmental dose-optimization program which includes automated
exposure control, adjustment of the mA and/or kV according to
patient size and/or use of iterative reconstruction technique.

CONTRAST:  60mL OMNIPAQUE IOHEXOL 350 MG/ML SOLN
FINDINGS: Cardiovascular: There is limited opacification of the pulmonary
arteries. There is no evidence of large central pulmonary embolus in
the main pulmonary artery or the main portions of the left and right
pulmonary arteries. However, smaller and more peripheral pulmonary
emboli cannot be excluded on the basis of this exam. Mild
cardiomegaly is noted. No pericardial effusion.

Mediastinum/Nodes: No enlarged mediastinal, hilar, or axillary lymph
nodes. Thyroid gland, trachea, and esophagus demonstrate no
significant findings.

Lungs/Pleura: Lungs are clear. No pleural effusion or pneumothorax.

Upper Abdomen: No acute abnormality.

Musculoskeletal: No chest wall abnormality. No acute or significant
osseous findings.

Review of the MIP images confirms the above findings.
IMPRESSION: There is no evidence of large central pulmonary embolus in the main
pulmonary artery or the main portions of the left and right
pulmonary arteries. However, due to limited opacification of the
pulmonary arteries, smaller and more peripheral pulmonary emboli
cannot be excluded on the basis of this exam.

## 2022-03-22 IMAGING — CT CT HEART MORP W/ CTA COR W/ SCORE W/ CA W/CM &/OR W/O CM
4 of 7 series · 8 of 20 positions shown, 9 images · IV contrast (APPLIED)
Comparison: May 31, 2021.
COMPARISON: May 31, 2021.

Addendum:
EXAM:
OVER-READ INTERPRETATION  CT CHEST

The following report is an over-read performed by radiologist Dr.
over-read does not include interpretation of cardiac or coronary
anatomy or pathology. The coronary calcium score/coronary CTA
interpretation by the cardiologist is attached. Imaging confined to
the mid chest and cardiac structures only.
CLINICAL DATA: 45 Year-old African American Male
Cardiac/Coronary  CTA
TECHNIQUE: The patient was scanned on a [employer] Force scanner.

[Series 6: ts diast sharp · axial · 0.45mm/px · z∈[+1355,+1398]mm · 2 of 318 slices shown]
[im 106/318  lung]
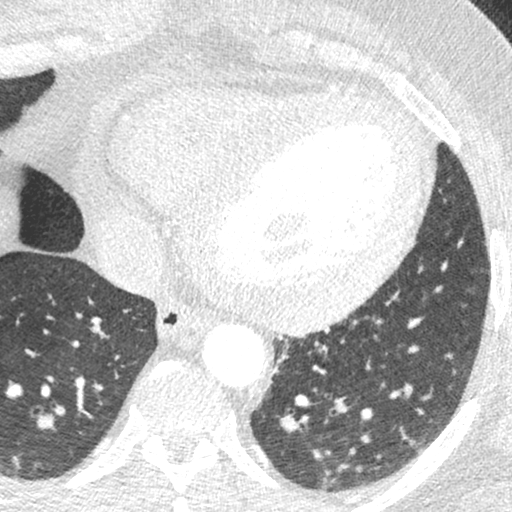
[im 212/318  lung]
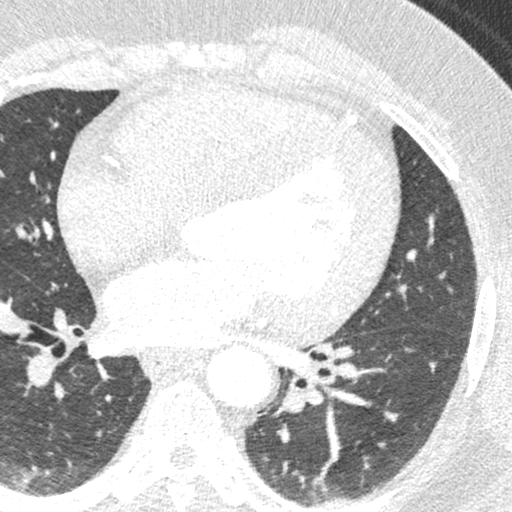

[Series 7: best diast · axial · 0.45mm/px · z∈[+1355,+1398]mm · 2 of 318 slices shown, 3 images]
[im 106/318  vessel]
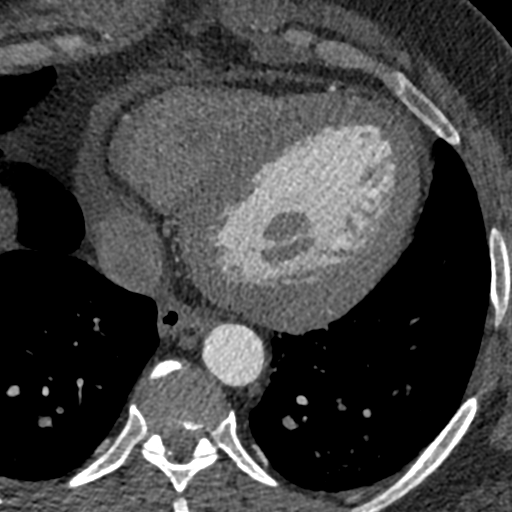
[im 106/318  lung]
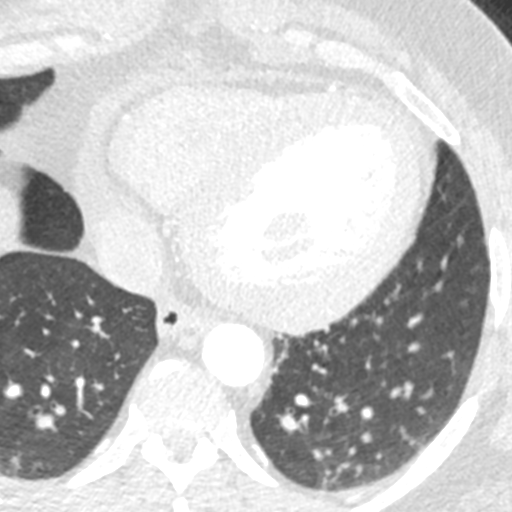
[im 212/318  vessel]
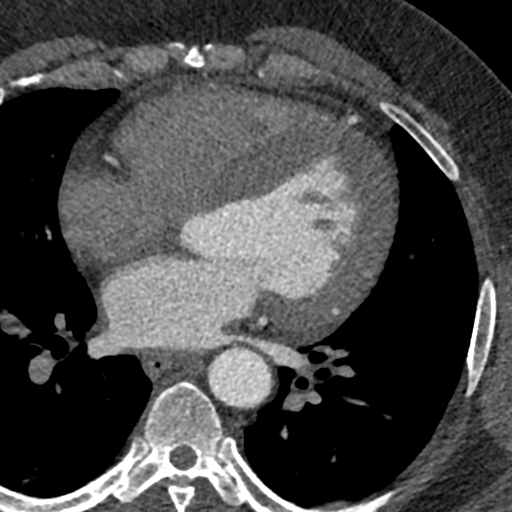

[Series 8: ts syst sharp · axial · 0.45mm/px · z∈[+1355,+1398]mm · 2 of 318 slices shown]
[im 106/318  lung]
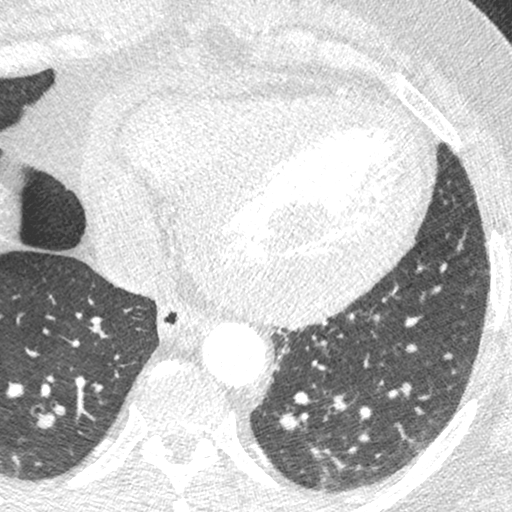
[im 212/318  lung]
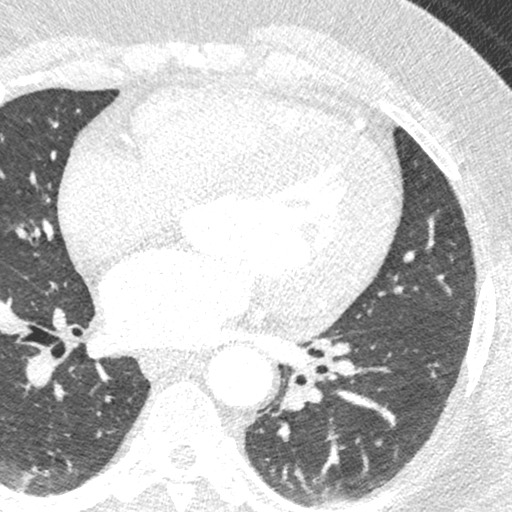

[Series 9: best syst · axial · 0.45mm/px · z∈[+1355,+1398]mm · 2 of 318 slices shown]
[im 106/318  vessel]
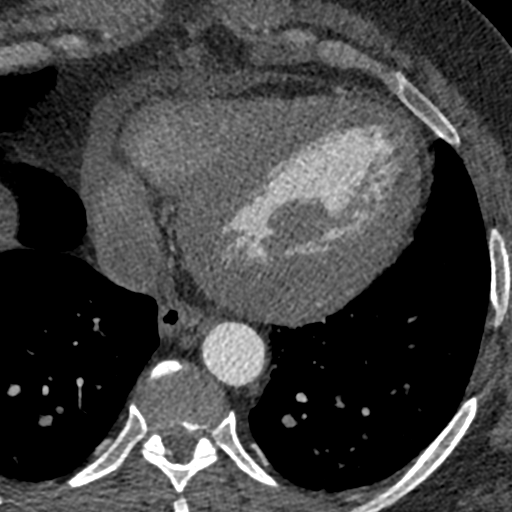
[im 212/318  vessel]
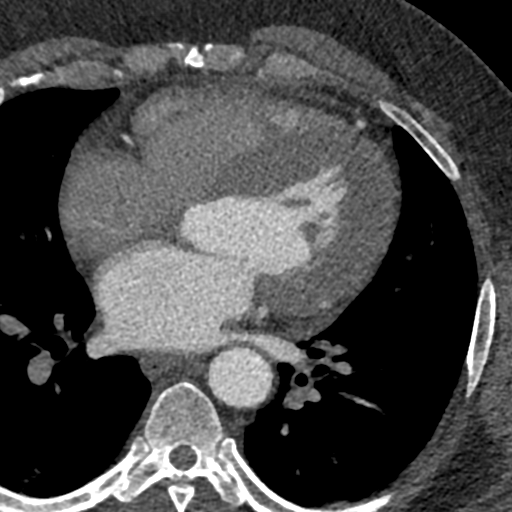

[8 of 20 positions shown; findings below may reference images not displayed]

FINDINGS: Vascular: See dedicated report for cardiovascular details.

Mediastinum/Nodes: No adenopathy in the central chest. Esophagus is
mildly patulous and unchanged.

Lungs/Pleura: Mild basilar atelectasis. No consolidation. No pleural
effusion. Airways are patent.

Upper Abdomen: At least mild hepatic steatosis. Limited imaging of
upper abdominal contents without acute process otherwise
unremarkable.

Musculoskeletal: No chest wall mass or suspicious bone lesions
identified.
IMPRESSION: 1. Hepatic steatosis which is at least mild. No acute extracardiac
findings.
FINDINGS: Scan was triggered in the descending thoracic aorta. Axial
non-contrast 3 mm slices were carried out through the heart. The
data set was analyzed on a dedicated work station and scored using
the Agatson method. Gantry rotation speed was 250 msecs and
collimation was .6 mm. 0.8 mg of sl NTG was given. The 3D data set
was reconstructed in 5% intervals of the 67-82 % of the R-R cycle.
Diastolic phases were analyzed on a dedicated work station using
MPR, MIP and VRT modes. The patient received 95 cc of contrast.

Aorta:  Normal size.  No calcifications.  No dissection.

Main Pulmonary Artery: Normal size of the pulmonary artery.

Aortic Valve:  Tri-leaflet.  No calcifications.

Coronary Arteries:  Normal coronary origin.  Left dominance.

Coronary Calcium Score:

Left main: 24

Left anterior descending artery: 0

Left circumflex artery: 0

Right coronary artery: 0

Ramus intermedius artery: 0

Total: 24

RCA is a non dominant artery.  There is no significant plaque.

Left main is a large artery that gives rise to LAD and LCX arteries.
There is a minimal distal calcified plaque.

LAD is a large vessel that gives rise to one large D1 Branch and
wraps around the heart to supply the PDA region. There is a minimal
soft plaque in the mid LAD.

LCX is a non-dominant artery that gives rise to one OM1 branch.
There is no significant plaque.

There is a ramus intermedius vessel with no significant plaque.

Other findings:

Normal pulmonary vein drainage into the left atrium.

Normal left atrial appendage without a thrombus.

Extra-cardiac findings: See attached radiology report for
non-cardiac structures.
IMPRESSION: 1. Coronary calcium score of 24. Elevated based on age, race, and
gender.

2. Normal coronary origin with left dominance.

3. CAD-RADS 1. Minimal non-obstructive CAD (1-24%). Consider
non-atherosclerotic causes of chest pain. Consider preventive
therapy and risk factor modification.

RECOMMENDATIONS:



If CAC = 0, it is reasonable to withhold statin therapy and reassess
in 5 to 10 years, as long as higher risk conditions are absent
(diabetes mellitus, family history of premature CHD in first degree
relatives (males <55 years; females <65 years), cigarette smoking,
LDL >=190 mg/dL or other independent risk factors).

If CAC is 1 to 99, it is reasonable to initiate statin therapy for
patients >=55 years of age.

If CAC is >=100 or >=75th percentile, it is reasonable to initiate
statin therapy at any age.

Cardiology referral should be considered for patients with CAC
scores =400 or >=75th percentile.

*5234 AHA/ACC/AACVPR/AAPA/ABC/ES/TUAN/TIRADO/Isabelle/VEDRO/FEKOLLI/CAFETERIA
Guideline on the Management of Blood Cholesterol: A Report of the
American College of Cardiology/American Heart Association Task Force
on Clinical Practice Guidelines. J Am Coll Cardiol.
4687;73(24):0326-0360.

*** End of Addendum ***
EXAM:
OVER-READ INTERPRETATION  CT CHEST

The following report is an over-read performed by radiologist Dr.
over-read does not include interpretation of cardiac or coronary
anatomy or pathology. The coronary calcium score/coronary CTA
interpretation by the cardiologist is attached. Imaging confined to
the mid chest and cardiac structures only.
FINDINGS: Vascular: See dedicated report for cardiovascular details.

Mediastinum/Nodes: No adenopathy in the central chest. Esophagus is
mildly patulous and unchanged.

Lungs/Pleura: Mild basilar atelectasis. No consolidation. No pleural
effusion. Airways are patent.

Upper Abdomen: At least mild hepatic steatosis. Limited imaging of
upper abdominal contents without acute process otherwise
unremarkable.

Musculoskeletal: No chest wall mass or suspicious bone lesions
identified.
IMPRESSION: 1. Hepatic steatosis which is at least mild. No acute extracardiac
findings.

## 2022-03-25 ENCOUNTER — Other Ambulatory Visit (HOSPITAL_COMMUNITY): Payer: Self-pay

## 2022-03-25 ENCOUNTER — Other Ambulatory Visit: Payer: Self-pay

## 2022-03-25 ENCOUNTER — Other Ambulatory Visit (HOSPITAL_BASED_OUTPATIENT_CLINIC_OR_DEPARTMENT_OTHER): Payer: Self-pay | Admitting: Family Medicine

## 2022-03-27 ENCOUNTER — Other Ambulatory Visit: Payer: Self-pay

## 2022-03-27 ENCOUNTER — Other Ambulatory Visit (HOSPITAL_COMMUNITY): Payer: Self-pay

## 2022-03-27 MED ORDER — GABAPENTIN 300 MG PO CAPS
300.0000 mg | ORAL_CAPSULE | Freq: Two times a day (BID) | ORAL | 0 refills | Status: DC
  Filled 2022-03-27: qty 60, 30d supply, fill #0

## 2022-03-28 ENCOUNTER — Telehealth: Payer: Self-pay | Admitting: Physical Medicine and Rehabilitation

## 2022-03-28 NOTE — Telephone Encounter (Signed)
Tried calling to discuss per Dr Romona Curls note, no answer. Please follow up

## 2022-03-28 NOTE — Telephone Encounter (Signed)
Pt called requesting an appt for back injection. Pt phone number is [phone]

## 2022-03-29 ENCOUNTER — Other Ambulatory Visit (HOSPITAL_COMMUNITY): Payer: Self-pay

## 2022-03-29 NOTE — Telephone Encounter (Signed)
Patient also requesting appt with Bevely Palmer. We will have him see her first

## 2022-03-30 ENCOUNTER — Ambulatory Visit: Payer: Commercial Managed Care - HMO | Admitting: Physician Assistant

## 2022-03-30 ENCOUNTER — Ambulatory Visit: Payer: Commercial Managed Care - HMO | Admitting: Sports Medicine

## 2022-03-30 ENCOUNTER — Ambulatory Visit (INDEPENDENT_AMBULATORY_CARE_PROVIDER_SITE_OTHER): Payer: Commercial Managed Care - HMO

## 2022-03-30 ENCOUNTER — Encounter: Payer: Self-pay | Admitting: Physician Assistant

## 2022-03-30 DIAGNOSIS — M5440 Lumbago with sciatica, unspecified side: Secondary | ICD-10-CM | POA: Diagnosis not present

## 2022-03-30 DIAGNOSIS — M25551 Pain in right hip: Secondary | ICD-10-CM | POA: Diagnosis not present

## 2022-03-30 DIAGNOSIS — M79641 Pain in right hand: Secondary | ICD-10-CM

## 2022-03-30 NOTE — Progress Notes (Signed)
Office Visit Note   Patient: Jordan Sparks           Date of Birth: 25-Jul-1975           MRN: [id number] Visit Date: 03/30/2022              Requested by: de [place], Raymond J, MD [id number] [place],  [place] [address] PCP: de [place], Raymond J, MD   Assessment & Plan: Visit Diagnoses:  1. Pain in right hip   2. Pain in right hand   3. Acute right-sided low back pain with sciatica, sciatica laterality unspecified     Plan: Patient is a pleasant 47 year old gentleman who is a former patient of Dr. Rudene Anda.  He has a history of low back pain as well as right hip pain.  Also had carpal tunnel release with Dr. Lorin Mercy in November.  With regards to the injections last summer into his back he has had both an MRI of his lumbar spine last year as well as electrodiagnostic studies.  Electrodiagnostic studies were normal.  He did have evidence of some stenosis and arthropathy at the L4-5 L5-S1 level.  He did have an interlaminal injection with Dr. Ernestina Patches at the L5-S1 level last summer.  This seemed to help him for a while.  He also had groin pain and hip pain and had a ultrasound-guided injection with Dr. Rolena Infante which really gave him a lot of relief.  He had called the office requesting repeat injections of his back but had significant pain in his hip so asked to be evaluated today.  A lot of his pain seems to be coming from his hip and since he had good results with the injection he will come back tomorrow and get another injection in his right hip.  With regards to his lower back injections he thinks they were helpful but does not remember.  He also has concerns because he is getting more numbness and tingling in his right leg and it gives way on him without notice.  Recently it did this and it caused him to have a left ankle sprain.  Denies any loss of bowel or bladder control.  Also comes in today status post carpal tunnel release on the right wrist with Dr. Inda Merlin.  He is actually doing  fairly well that but is now developing some triggering in his right thumb.  Discussed to him about an overall plan to address these issues.  I think he will do well with the hip injection tomorrow that we will also define how much back pain he still having.  Per Dr. Romona Curls suggestion if he had not gotten significantly better with back injections and considering his continuing radicular symptoms would recommend he follow-up with Dr. Lorin Mercy.  He is a diabetic and I considered injecting his trigger thumb today however if he is getting a steroid injection tomorrow I will defer this and of asked that he use some topical Voltaren gel.  He may also follow-up with this with Dr. Lorin Mercy.  Follow-Up Instructions: Return With Dr. Rolena Infante for repeat right hip injection.  With Dr. Lorin Mercy to discuss back issues.   Orders:  Orders Placed This Encounter  Procedures   XR HIP UNILAT W OR W/O PELVIS 2-3 VIEWS RIGHT   XR Hand Complete Right   No orders of the defined types were placed in this encounter.     Procedures: No procedures performed   Clinical Data: No additional findings.   Subjective: Chief Complaint  Patient presents with   Right Hip - Pain   Right Hand - Pain    HPI pleasant 47 year old gentleman who presents today with right hand pain and focuses over his right thumb which she describes as it gets stuck and pops and hurts.  No injury.  He is status post carpal tunnel release a few months ago by Dr. Ophelia Charter and with regards to recovery on this he thinks he is doing well.  Also complaining of right hip pain.  He is wondering if he can get an injection.  Has had 1 in the past with Dr. Shon Baton and done well.  Also complaining of progressive paresthesias and weakness in his right leg.  Denies any injury but has had a couple falls no loss of bowel or bladder control  Review of Systems  All other systems reviewed and are negative.    Objective: Vital Signs: There were no vitals taken for this  visit.  Physical Exam Pulmonary:     Effort: Pulmonary effort is normal.  Skin:    General: Skin is warm and dry.  Neurological:     Mental Status: He is alert.     Ortho Exam Examination of his right hip: He does have reproducible pain with internal/external rotation of the hip that extends from his groin to the lateral aspect of his hip. Lower back he has focal tenderness over the lower right back.  He does have some subjective altered sensation going down his leg to his foot.  Strength is intact. Right hand well-healed surgical incision no redness no erythema.  He does have active triggering at the DIP and [facility] joint.  Tender over the base of the thumb on the volar surface.  No evidence of any atrophy.  Strong radial pulse Specialty Comments:  MRI LUMBAR SPINE WITHOUT CONTRAST   TECHNIQUE: Multiplanar, multisequence MR imaging of the lumbar spine was performed. No intravenous contrast was administered.   COMPARISON:  Lumbar spine radiographs 04/14/2021. CTA chest, abdomen, and pelvis 06/18/2020.   FINDINGS: The study is motion degraded, moderately so on the axial sequences.   Segmentation: Standard.   Alignment: Trace retrolisthesis of L4 on L5. Straightening of the normal lumbar lordosis.   Vertebrae: No fracture, suspicious marrow lesion, or significant marrow edema. Mild chronic degenerative endplate changes at L5-S1.   Conus medullaris and cauda equina: Conus extends to the L1-2 level. Conus and cauda equina appear normal.   Paraspinal and other soft tissues: Unremarkable.   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   T12-L1 and L1-2: Negative.   L2-3: Minimal disc bulging without stenosis.   L3-4: Mild disc bulging without significant stenosis.   L4-5: Mild disc desiccation. Circumferential disc bulging and mild facet hypertrophy result in mild spinal stenosis, mild bilateral lateral recess stenosis, and  mild-to-moderate bilateral neural foraminal stenosis.   L5-S1: Disc desiccation. Disc bulging and mild facet hypertrophy result in mild right and moderate left neural foraminal stenosis without spinal stenosis.   IMPRESSION: 1. Motion degraded examination. 2. 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     Electronically Signed   By: Sebastian Ache M.D.   On: 05/16/2021 18:00  Imaging: XR HIP UNILAT W OR W/O PELVIS 2-3 VIEWS RIGHT  Result Date: 03/30/2022 Radiographs of his right hip were obtained today.  No evidence of  dislocation or subluxation he does have some sclerotic changes in the hip joint.  No changes since previous x-rays  XR Hand Complete Right  Result Date: 03/30/2022 Radiographs of his hand were obtained today.  Well-maintained alignment no evidence of any fracture no significant degenerative changes maybe a small amount at the base of the first [facility] joint.  No subluxation.    PMFS History: Patient Active Problem List   Diagnosis Date Noted   Bipolar 1 disorder, mixed, moderate ([place]) 12/06/2021   Insomnia 12/06/2021   Encounter for long-term (current) use of medications 12/06/2021   Unilateral primary osteoarthritis, right hip 11/30/2021   Carpal tunnel syndrome, right upper limb 11/30/2021   Low back pain 10/11/2021   Peripheral neuropathy 10/11/2021   Pain in left shoulder 08/31/2021   Spinal stenosis of cervical region 06/09/2021   Nonischemic cardiomyopathy ([place]) 06/06/2021   Chest pain 06/01/2021   Chest pain of uncertain etiology 29/52/8413   Obesity, Class III, BMI 40-49.9 (morbid obesity) ([place]) 05/31/2021   Diabetes mellitus ([place]) 05/04/2021   Sleep apnea 05/04/2021   Hypertensive urgency 10/19/2020   GERD (gastroesophageal reflux disease) 02/09/2018   Hyperglycemia 02/08/2018   Atypical chest pain 02/08/2018    Hypokalemia 02/08/2018   Leukocytosis 02/08/2018   Nausea and vomiting 02/08/2018   Mood disorder ([place])    Hypertension    Anxiety    Hyperglycemia without ketosis    Manic disorder ([place])    Past Medical History:  Diagnosis Date   Anxiety    Asthma    CHF (congestive heart failure) ([place])    Coronary artery disease    Depression    Diabetes mellitus type 2 in obese ([place])    Hypertension    Manic disorder ([place])    Obesity    Sleep apnea     Family History  Problem Relation Age of Onset   Seizures Brother    Heart failure Neg Hx     Past Surgical History:  Procedure Laterality Date   CARPAL TUNNEL RELEASE Right 12/19/2021   Procedure: RIGHT CARPAL TUNNEL RELEASE;  Surgeon: Marybelle Killings, MD;  Location: [facility];  Service: Orthopedics;  Laterality: Right;   Dental procedure     Social History   Occupational History   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

## 2022-03-31 ENCOUNTER — Ambulatory Visit: Payer: Commercial Managed Care - HMO | Admitting: Sports Medicine

## 2022-04-04 ENCOUNTER — Other Ambulatory Visit (HOSPITAL_COMMUNITY): Payer: Self-pay

## 2022-04-05 ENCOUNTER — Other Ambulatory Visit (HOSPITAL_COMMUNITY): Payer: Self-pay

## 2022-04-05 ENCOUNTER — Encounter (HOSPITAL_COMMUNITY): Payer: Self-pay

## 2022-04-07 ENCOUNTER — Ambulatory Visit: Payer: Self-pay

## 2022-04-07 ENCOUNTER — Encounter: Payer: Self-pay | Admitting: Sports Medicine

## 2022-04-07 ENCOUNTER — Ambulatory Visit (INDEPENDENT_AMBULATORY_CARE_PROVIDER_SITE_OTHER): Payer: Commercial Managed Care - HMO | Admitting: Sports Medicine

## 2022-04-07 ENCOUNTER — Other Ambulatory Visit: Payer: Self-pay

## 2022-04-07 ENCOUNTER — Other Ambulatory Visit (HOSPITAL_COMMUNITY): Payer: Self-pay

## 2022-04-07 VITALS — Ht 70.0 in | Wt 312.0 lb

## 2022-04-07 DIAGNOSIS — M25551 Pain in right hip: Secondary | ICD-10-CM

## 2022-04-07 MED ORDER — METHYLPREDNISOLONE ACETATE 40 MG/ML IJ SUSP
40.0000 mg | INTRAMUSCULAR | Status: AC | PRN
  Administered 2022-04-07: 40 mg via INTRA_ARTICULAR

## 2022-04-07 MED ORDER — LIDOCAINE HCL 1 % IJ SOLN
4.0000 mL | INTRAMUSCULAR | Status: AC | PRN
  Administered 2022-04-07: 4 mL

## 2022-04-07 NOTE — Progress Notes (Signed)
   Procedure Note  Patient: Brenen Sparks             Date of Birth: 06/13/1975           MRN: [id number]             Visit Date: 04/07/2022  Procedures: Visit Diagnoses:  1. Pain in right hip    Large Joint Inj: R hip joint on 04/07/2022 9:50 AM Indications: pain Details: 22 G 3.5 in needle, ultrasound-guided anterior approach Medications: 4 mL lidocaine 1 %; 40 mg methylPREDNISolone acetate 40 MG/ML Outcome: tolerated well, no immediate complications  Procedure: US-guided intra-articular hip injection, right After discussion on risks/benefits/indications and informed verbal consent was obtained, a timeout was performed. Patient was lying supine on exam table. The hip was cleaned with betadine and alcohol swabs. Then utilizing ultrasound guidance, the patient's femoral head and neck junction was identified and subsequently injected with 4:1 lidocaine:depomedrol via an in-plane approach with ultrasound visualization of the injectate administered into the hip joint. Patient tolerated procedure well without immediate complications.  Procedure, treatment alternatives, risks and benefits explained, specific risks discussed. Consent was given by the patient. Immediately prior to procedure a time out was called to verify the correct patient, procedure, equipment, support staff and site/side marked as required. Patient was prepped and draped in the usual sterile fashion.     - I evaluated the patient about 10 minutes post-injection and he had excellent  improvement in pain and range of motion - follow-up with Mary-Anne Persons, PA or Dr. Lorin Mercy as indicated; I am happy to see them as needed  Elba Barman, DO [place]  This note was dictated using Dragon naturally speaking software and may contain errors in syntax, spelling, or content which have not been identified prior to signing this note.

## 2022-04-09 ENCOUNTER — Other Ambulatory Visit (HOSPITAL_COMMUNITY): Payer: Self-pay | Admitting: Psychiatry

## 2022-04-09 DIAGNOSIS — F3162 Bipolar disorder, current episode mixed, moderate: Secondary | ICD-10-CM

## 2022-04-10 ENCOUNTER — Other Ambulatory Visit: Payer: Self-pay

## 2022-04-10 ENCOUNTER — Other Ambulatory Visit (HOSPITAL_COMMUNITY): Payer: Self-pay

## 2022-04-10 ENCOUNTER — Encounter (HOSPITAL_COMMUNITY): Payer: Self-pay

## 2022-04-10 MED ORDER — DULOXETINE HCL 60 MG PO CPEP
60.0000 mg | ORAL_CAPSULE | Freq: Two times a day (BID) | ORAL | 2 refills | Status: DC
  Filled 2022-04-10: qty 60, 30d supply, fill #0

## 2022-04-14 ENCOUNTER — Encounter: Payer: Self-pay | Admitting: Orthopaedic Surgery

## 2022-04-14 ENCOUNTER — Other Ambulatory Visit: Payer: Self-pay

## 2022-04-14 ENCOUNTER — Ambulatory Visit (INDEPENDENT_AMBULATORY_CARE_PROVIDER_SITE_OTHER): Payer: Commercial Managed Care - HMO | Admitting: Orthopaedic Surgery

## 2022-04-14 VITALS — BP 168/98 | HR 92 | Ht 70.0 in | Wt 289.0 lb

## 2022-04-14 DIAGNOSIS — M65311 Trigger thumb, right thumb: Secondary | ICD-10-CM

## 2022-04-14 DIAGNOSIS — M1611 Unilateral primary osteoarthritis, right hip: Secondary | ICD-10-CM | POA: Diagnosis not present

## 2022-04-14 NOTE — Progress Notes (Unsigned)
Office Visit Note   Patient: Jordan Sparks           Date of Birth: 01/21/1976           MRN: [id number] Visit Date: 04/14/2022              Requested by: de [place], Raymond J, MD [address] [place],  [place] [address] PCP: de [place], Raymond J, MD   Assessment & Plan: Visit Diagnoses: No diagnosis found.  Plan: ***  Follow-Up Instructions: No follow-ups on file.   Orders:  No orders of the defined types were placed in this encounter.  No orders of the defined types were placed in this encounter.     Procedures: No procedures performed   Clinical Data: No additional findings.   Subjective: Chief Complaint  Patient presents with   Lower Back - Pain   Right Hand - Pain    HPI  Review of Systems   Objective: Vital Signs: BP (!) 168/98   Pulse 92   Ht 5\' 10"  (1.778 m)   Wt 289 lb (131.1 kg)   BMI 41.47 kg/m   Physical Exam  Ortho Exam  Specialty Comments:  MRI LUMBAR SPINE WITHOUT CONTRAST   TECHNIQUE: Multiplanar, multisequence MR imaging of the lumbar spine was performed. No intravenous contrast was administered.   COMPARISON:  Lumbar spine radiographs 04/14/2021. CTA chest, abdomen, and pelvis 06/18/2020.   FINDINGS: The study is motion degraded, moderately so on the axial sequences.   Segmentation: Standard.   Alignment: Trace retrolisthesis of L4 on L5. Straightening of the normal lumbar lordosis.   Vertebrae: No fracture, suspicious marrow lesion, or significant marrow edema. Mild chronic degenerative endplate changes at A4-Z6.   Conus medullaris and cauda equina: Conus extends to the L1-2 level. Conus and cauda equina appear normal.   Paraspinal and other soft tissues: Unremarkable.   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   T12-L1 and L1-2: Negative.   L2-3: Minimal disc bulging without stenosis.   L3-4: Mild disc bulging without significant stenosis.    L4-5: Mild disc desiccation. Circumferential disc bulging and mild facet hypertrophy result in mild spinal stenosis, mild bilateral lateral recess stenosis, and mild-to-moderate bilateral neural foraminal stenosis.   L5-S1: Disc desiccation. Disc bulging and mild facet hypertrophy result in mild right and moderate left neural foraminal stenosis without spinal stenosis.   IMPRESSION: 1. Motion degraded examination. 2. 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     Electronically Signed   By: Logan Bores M.D.   On: 05/16/2021 18:00  Imaging: No results found.   PMFS History: Patient Active Problem List   Diagnosis Date Noted   Bipolar 1 disorder, mixed, moderate ([place]) 12/06/2021   Insomnia 12/06/2021   Encounter for long-term (current) use of medications 12/06/2021   Unilateral primary osteoarthritis, right hip 11/30/2021   Carpal tunnel syndrome, right upper limb 11/30/2021   Low back pain 10/11/2021   Peripheral neuropathy 10/11/2021   Pain in left shoulder 08/31/2021   Spinal stenosis of cervical region 06/09/2021   Nonischemic cardiomyopathy ([place]) 06/06/2021   Chest pain 06/01/2021   Chest pain of uncertain etiology 60/63/0160   Obesity, Class III, BMI 40-49.9 (morbid obesity) ([place]) 05/31/2021   Diabetes mellitus ([place]) 05/04/2021   Sleep apnea 05/04/2021   Hypertensive urgency 10/19/2020  GERD (gastroesophageal reflux disease) 02/09/2018   Hyperglycemia 02/08/2018   Atypical chest pain 02/08/2018   Hypokalemia 02/08/2018   Leukocytosis 02/08/2018   Nausea and vomiting 02/08/2018   Mood disorder ([place])    Hypertension    Anxiety    Hyperglycemia without ketosis    Manic disorder ([place])    Past Medical History:  Diagnosis Date   Anxiety    Asthma    CHF (congestive heart failure) ([place])    Coronary artery disease     Depression    Diabetes mellitus type 2 in obese ([facility])    Hypertension    Manic disorder ([place])    Obesity    Sleep apnea     Family History  Problem Relation Age of Onset   Seizures Brother    Heart failure Neg Hx     Past Surgical History:  Procedure Laterality Date   CARPAL TUNNEL RELEASE Right 12/19/2021   Procedure: RIGHT CARPAL TUNNEL RELEASE;  Surgeon: Marybelle Killings, MD;  Location: [facility];  Service: Orthopedics;  Laterality: Right;   Dental procedure     Social History   Occupational History   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

## 2022-04-17 ENCOUNTER — Other Ambulatory Visit: Payer: Self-pay

## 2022-04-18 DIAGNOSIS — M65311 Trigger thumb, right thumb: Secondary | ICD-10-CM | POA: Insufficient documentation

## 2022-04-18 MED ORDER — LIDOCAINE HCL 1 % IJ SOLN
0.5000 mL | INTRAMUSCULAR | Status: AC | PRN
  Administered 2022-04-18: .5 mL

## 2022-04-18 MED ORDER — BUPIVACAINE HCL 0.25 % IJ SOLN
0.5000 mL | INTRAMUSCULAR | Status: AC | PRN
  Administered 2022-04-18: .5 mL

## 2022-04-18 MED ORDER — METHYLPREDNISOLONE ACETATE 40 MG/ML IJ SUSP
20.0000 mg | INTRAMUSCULAR | Status: AC | PRN
  Administered 2022-04-18: 20 mg

## 2022-04-19 ENCOUNTER — Other Ambulatory Visit (HOSPITAL_COMMUNITY): Payer: Self-pay

## 2022-04-19 ENCOUNTER — Other Ambulatory Visit: Payer: Self-pay

## 2022-04-19 ENCOUNTER — Other Ambulatory Visit: Payer: Self-pay | Admitting: Radiology

## 2022-04-19 DIAGNOSIS — M5440 Lumbago with sciatica, unspecified side: Secondary | ICD-10-CM

## 2022-04-20 ENCOUNTER — Other Ambulatory Visit (HOSPITAL_COMMUNITY): Payer: Self-pay

## 2022-04-21 ENCOUNTER — Encounter (HOSPITAL_COMMUNITY): Payer: Self-pay

## 2022-04-21 ENCOUNTER — Other Ambulatory Visit (HOSPITAL_BASED_OUTPATIENT_CLINIC_OR_DEPARTMENT_OTHER): Payer: Self-pay | Admitting: Family Medicine

## 2022-04-21 ENCOUNTER — Other Ambulatory Visit (HOSPITAL_COMMUNITY): Payer: Self-pay | Admitting: Psychiatry

## 2022-04-21 ENCOUNTER — Other Ambulatory Visit (HOSPITAL_BASED_OUTPATIENT_CLINIC_OR_DEPARTMENT_OTHER): Payer: Self-pay

## 2022-04-21 ENCOUNTER — Other Ambulatory Visit (HOSPITAL_COMMUNITY): Payer: Self-pay

## 2022-04-21 DIAGNOSIS — F3162 Bipolar disorder, current episode mixed, moderate: Secondary | ICD-10-CM

## 2022-04-21 DIAGNOSIS — I10 Essential (primary) hypertension: Secondary | ICD-10-CM

## 2022-04-22 ENCOUNTER — Other Ambulatory Visit (HOSPITAL_COMMUNITY): Payer: Self-pay

## 2022-04-22 MED ORDER — LITHIUM CARBONATE 150 MG PO CAPS
150.0000 mg | ORAL_CAPSULE | Freq: Two times a day (BID) | ORAL | 0 refills | Status: DC
  Filled 2022-04-22 – 2022-09-07 (×7): qty 6, 3d supply, fill #0

## 2022-04-24 ENCOUNTER — Other Ambulatory Visit: Payer: Self-pay

## 2022-04-24 ENCOUNTER — Other Ambulatory Visit (HOSPITAL_COMMUNITY): Payer: Self-pay

## 2022-04-24 ENCOUNTER — Other Ambulatory Visit: Payer: Self-pay | Admitting: Student

## 2022-04-24 MED ORDER — AMLODIPINE BESYLATE 5 MG PO TABS
5.0000 mg | ORAL_TABLET | Freq: Every day | ORAL | 1 refills | Status: DC
  Filled 2022-04-24 – 2022-09-07 (×8): qty 30, 30d supply, fill #0

## 2022-04-24 MED ORDER — GABAPENTIN 300 MG PO CAPS
300.0000 mg | ORAL_CAPSULE | Freq: Two times a day (BID) | ORAL | 0 refills | Status: DC
  Filled 2022-04-24 – 2022-09-07 (×10): qty 60, 30d supply, fill #0

## 2022-04-24 MED ORDER — CARVEDILOL 25 MG PO TABS
25.0000 mg | ORAL_TABLET | Freq: Two times a day (BID) | ORAL | 1 refills | Status: DC
  Filled 2022-04-24 – 2022-09-07 (×7): qty 60, 30d supply, fill #0

## 2022-04-26 ENCOUNTER — Other Ambulatory Visit: Payer: Self-pay

## 2022-04-27 ENCOUNTER — Ambulatory Visit (INDEPENDENT_AMBULATORY_CARE_PROVIDER_SITE_OTHER): Payer: Commercial Managed Care - HMO | Admitting: Physical Medicine and Rehabilitation

## 2022-04-27 ENCOUNTER — Ambulatory Visit: Payer: Self-pay

## 2022-04-27 ENCOUNTER — Other Ambulatory Visit (HOSPITAL_COMMUNITY): Payer: Self-pay

## 2022-04-27 VITALS — BP 166/111 | HR 116

## 2022-04-27 DIAGNOSIS — M5416 Radiculopathy, lumbar region: Secondary | ICD-10-CM

## 2022-04-27 MED ORDER — METHYLPREDNISOLONE ACETATE 80 MG/ML IJ SUSP
80.0000 mg | Freq: Once | INTRAMUSCULAR | Status: AC
  Administered 2022-04-27: 80 mg

## 2022-04-27 NOTE — Patient Instructions (Signed)

## 2022-04-27 NOTE — Progress Notes (Signed)
Functional Pain Scale - descriptive words and definitions   Severe (9)  Cannot do any ADL's even with assistance can barely talk/unable to sleep and unable to use distraction. Severe range order  Average Pain 8   +Driver, -BT, -Dye Allergies.  Lower back pain in the center, but it is radiating up the back and down the right leg

## 2022-04-28 ENCOUNTER — Other Ambulatory Visit (HOSPITAL_COMMUNITY): Payer: Self-pay

## 2022-04-28 ENCOUNTER — Encounter (HOSPITAL_COMMUNITY): Payer: Self-pay

## 2022-04-28 ENCOUNTER — Other Ambulatory Visit: Payer: Self-pay

## 2022-04-28 ENCOUNTER — Other Ambulatory Visit (HOSPITAL_BASED_OUTPATIENT_CLINIC_OR_DEPARTMENT_OTHER): Payer: Self-pay

## 2022-05-01 ENCOUNTER — Other Ambulatory Visit: Payer: Self-pay

## 2022-05-02 ENCOUNTER — Other Ambulatory Visit (HOSPITAL_COMMUNITY): Payer: Self-pay

## 2022-05-02 ENCOUNTER — Other Ambulatory Visit: Payer: Self-pay

## 2022-05-03 ENCOUNTER — Other Ambulatory Visit: Payer: Self-pay

## 2022-05-03 ENCOUNTER — Other Ambulatory Visit (HOSPITAL_COMMUNITY): Payer: Self-pay

## 2022-05-05 ENCOUNTER — Other Ambulatory Visit (HOSPITAL_COMMUNITY): Payer: Self-pay

## 2022-05-09 NOTE — Procedures (Signed)
Lumbar Epidural Steroid Injection - Interlaminar Approach with Fluoroscopic Guidance  Patient: Jordan Sparks      Date of Birth: [birth year] MRN: [id number] PCP: de [place], [place] J, MD      Visit Date: 04/27/2022   Universal Protocol:     Consent Given By: the patient  Position: PRONE  Additional Comments: Vital signs were monitored before and after the procedure. Patient was prepped and draped in the usual sterile fashion. The correct patient, procedure, and site was verified.   Injection Procedure Details:   Procedure diagnoses: Lumbar radiculopathy [M54.16]   Meds Administered:  Meds ordered this encounter  Medications   methylPREDNISolone acetate (DEPO-MEDROL) injection 80 mg     Laterality: Left  Location/Site:  L5-S1  Needle: 4.5 in., 20 ga. Tuohy  Needle Placement: Paramedian epidural  Findings:   -Comments: Excellent flow of contrast into the epidural space.  Procedure Details: Using a paramedian approach from the side mentioned above, the region overlying the inferior lamina was localized under fluoroscopic visualization and the soft tissues overlying this structure were infiltrated with 4 ml. of 1% Lidocaine without Epinephrine. The Tuohy needle was inserted into the epidural space using a paramedian approach.   The epidural space was localized using loss of resistance along with counter oblique bi-planar fluoroscopic views.  After negative aspirate for air, blood, and CSF, a 2 ml. volume of Isovue-250 was injected into the epidural space and the flow of contrast was observed. Radiographs were obtained for documentation purposes.    The injectate was administered into the level noted above.   Additional Comments:  The patient tolerated the procedure well Dressing: 2 x 2 sterile gauze and Band-Aid    Post-procedure details: Patient was observed during the procedure. Post-procedure instructions were reviewed.  Patient left the clinic in stable  condition.

## 2022-05-09 NOTE — Progress Notes (Signed)
Philander McGregor-Shabazz - 47 y.o. male MRN [id number]  Date of birth: [birth year]  Office Visit Note: Visit Date: 04/27/2022 PCP: de [place], [place] J, MD Referred by: de [place], [place] J, MD  Subjective: Chief Complaint  Patient presents with   Lower Back - Pain   HPI:  Jedd Burki is a 47 y.o. male who comes in today at the request of Dr. Rodell Perna for planned Left L5-S1 Lumbar Interlaminar epidural steroid injection with fluoroscopic guidance.  The patient has failed conservative care including home exercise, medications, time and activity modification.  This injection will be diagnostic and hopefully therapeutic.  Please see requesting physician notes for further details and justification.   ROS Otherwise per HPI.  Assessment & Plan: Visit Diagnoses:    ICD-10-CM   1. Lumbar radiculopathy  M54.16 XR C-ARM NO REPORT    Epidural Steroid injection    methylPREDNISolone acetate (DEPO-MEDROL) injection 80 mg      Plan: No additional findings.   Meds & Orders:  Meds ordered this encounter  Medications   methylPREDNISolone acetate (DEPO-MEDROL) injection 80 mg    Orders Placed This Encounter  Procedures   XR C-ARM NO REPORT   Epidural Steroid injection    Follow-up: Return for visit to requesting provider as needed.   Procedures: No procedures performed  Lumbar Epidural Steroid Injection - Interlaminar Approach with Fluoroscopic Guidance  Patient: Latif McGregor-Shabazz      Date of Birth: [birth year] MRN: [id number] PCP: de [place], [place] J, MD      Visit Date: 04/27/2022   Universal Protocol:     Consent Given By: the patient  Position: PRONE  Additional Comments: Vital signs were monitored before and after the procedure. Patient was prepped and draped in the usual sterile fashion. The correct patient, procedure, and site was verified.   Injection Procedure Details:   Procedure diagnoses: Lumbar radiculopathy [M54.16]   Meds Administered:  Meds  ordered this encounter  Medications   methylPREDNISolone acetate (DEPO-MEDROL) injection 80 mg     Laterality: Left  Location/Site:  L5-S1  Needle: 4.5 in., 20 ga. Tuohy  Needle Placement: Paramedian epidural  Findings:   -Comments: Excellent flow of contrast into the epidural space.  Procedure Details: Using a paramedian approach from the side mentioned above, the region overlying the inferior lamina was localized under fluoroscopic visualization and the soft tissues overlying this structure were infiltrated with 4 ml. of 1% Lidocaine without Epinephrine. The Tuohy needle was inserted into the epidural space using a paramedian approach.   The epidural space was localized using loss of resistance along with counter oblique bi-planar fluoroscopic views.  After negative aspirate for air, blood, and CSF, a 2 ml. volume of Isovue-250 was injected into the epidural space and the flow of contrast was observed. Radiographs were obtained for documentation purposes.    The injectate was administered into the level noted above.   Additional Comments:  The patient tolerated the procedure well Dressing: 2 x 2 sterile gauze and Band-Aid    Post-procedure details: Patient was observed during the procedure. Post-procedure instructions were reviewed.  Patient left the clinic in stable condition.   Clinical History: MRI LUMBAR SPINE WITHOUT CONTRAST   TECHNIQUE: Multiplanar, multisequence MR imaging of the lumbar spine was performed. No intravenous contrast was administered.   COMPARISON:  Lumbar spine radiographs 04/14/2021. CTA chest, abdomen, and pelvis 06/18/2020.   FINDINGS: The study is motion degraded, moderately so on the axial sequences.   Segmentation: Standard.   Alignment:  Trace retrolisthesis of L4 on L5. Straightening of the normal lumbar lordosis.   Vertebrae: No fracture, suspicious marrow lesion, or significant marrow edema. Mild chronic degenerative endplate  changes at 075-GRM.   Conus medullaris and cauda equina: Conus extends to the L1-2 level. Conus and cauda equina appear normal.   Paraspinal and other soft tissues: Unremarkable.   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   T12-L1 and L1-2: Negative.   L2-3: Minimal disc bulging without stenosis.   L3-4: Mild disc bulging without significant stenosis.   L4-5: Mild disc desiccation. Circumferential disc bulging and mild facet hypertrophy result in mild spinal stenosis, mild bilateral lateral recess stenosis, and mild-to-moderate bilateral neural foraminal stenosis.   L5-S1: Disc desiccation. Disc bulging and mild facet hypertrophy result in mild right and moderate left neural foraminal stenosis without spinal stenosis.   IMPRESSION: 1. Motion degraded examination. 2. 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     Electronically Signed   By: Logan Bores M.D.   On: 05/16/2021 18:00     Objective:  VS:  HT:    WT:   BMI:     BP:(!) 166/111  HR:(!) 116bpm  TEMP: ( )  RESP:  Physical Exam Vitals and nursing note reviewed.  Constitutional:      General: He is not in acute distress.    Appearance: Normal appearance. He is obese. He is not ill-appearing.  HENT:     Head: Normocephalic and atraumatic.     Right Ear: External ear normal.     Left Ear: External ear normal.     Nose: No congestion.  Eyes:     Extraocular Movements: Extraocular movements intact.  Cardiovascular:     Rate and Rhythm: Normal rate.     Pulses: Normal pulses.  Pulmonary:     Effort: Pulmonary effort is normal. No respiratory distress.  Abdominal:     General: There is no distension.     Palpations: Abdomen is soft.  Musculoskeletal:        General: No tenderness or signs of injury.      Cervical back: Neck supple.     Right lower leg: No edema.     Left lower leg: No edema.     Comments: Patient has good distal strength without clonus.  Skin:    Findings: No erythema or rash.  Neurological:     General: No focal deficit present.     Mental Status: He is alert and oriented to person, place, and time.     Sensory: No sensory deficit.     Motor: No weakness or abnormal muscle tone.     Coordination: Coordination normal.  Psychiatric:        Mood and Affect: Mood normal.        Behavior: Behavior normal.      Imaging: No results found.

## 2022-05-30 ENCOUNTER — Other Ambulatory Visit: Payer: Self-pay

## 2022-05-30 NOTE — Progress Notes (Signed)
Patient outreached by Georga Kaufmann, PharmD Candidate on 05/30/22 to discuss hypertension    Patient has an automated home blood pressure machine. They report home readings of ~190/120 mmHg    Medication review was performed. They are not taking medications as prescribed. Differences from their prescribed list include: Not currently taking Entresto due to difficulty affording medication. Patient reports his BP was < 130/80 when taking Entresto, but has been uncontrolled since he has not been able to take the medication (~ 1 month). He stated that his insurance "started charging" him and that he owes ~ $200 but cannot afford currently due to other expenses. He has 5 tablets of Farxiga left and is concerned he will not be able to afford refills. Informed patient to reach out to his cardiologist regarding medication affordability to see if they can enroll him in patient assistance for Entresto/Farxiga. Patient confirmed that he would call the cardiologist office regarding this.   The following barriers to adherence were noted:  - They do have cost concerns.  - They do have transportation concerns.  - They do need assistance obtaining refills.  - They do not occasionally forget to take some of their prescribed medications.  - They do not feel like one/some of their medications make them feel poorly.  - They do have questions or concerns about their medications.  - They do not have follow up scheduled with their primary care provider/cardiologist.   The following interventions were completed:  - Medications were reviewed  - Patient was educated on goal blood pressures and long term health implications of elevated blood pressure  - Patient was counseled on lifestyle modifications to improve blood pressure, including DASH diet and physical activity   The patient has follow up scheduled: not currently - patient stated he will call his PCP to schedule an appointment when his vehicle is fixed and he gets  a new engine PCP: Dr. de [place]    Georga Kaufmann, PharmD Candidate   Maryan Puls, PharmD PGY-1 [facility] Resident

## 2022-06-05 ENCOUNTER — Telehealth (HOSPITAL_COMMUNITY): Payer: Self-pay | Admitting: *Deleted

## 2022-06-05 NOTE — Telephone Encounter (Signed)
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

## 2022-06-06 ENCOUNTER — Telehealth (HOSPITAL_BASED_OUTPATIENT_CLINIC_OR_DEPARTMENT_OTHER): Payer: Commercial Managed Care - HMO | Admitting: Psychiatry

## 2022-06-06 ENCOUNTER — Other Ambulatory Visit: Payer: Self-pay

## 2022-06-06 ENCOUNTER — Encounter (HOSPITAL_COMMUNITY): Payer: Self-pay | Admitting: Psychiatry

## 2022-06-06 ENCOUNTER — Other Ambulatory Visit (HOSPITAL_COMMUNITY): Payer: Self-pay

## 2022-06-06 DIAGNOSIS — F3162 Bipolar disorder, current episode mixed, moderate: Secondary | ICD-10-CM | POA: Diagnosis not present

## 2022-06-06 DIAGNOSIS — G4700 Insomnia, unspecified: Secondary | ICD-10-CM | POA: Diagnosis not present

## 2022-06-06 MED ORDER — DULOXETINE HCL 60 MG PO CPEP
60.0000 mg | ORAL_CAPSULE | Freq: Two times a day (BID) | ORAL | 2 refills | Status: DC
  Filled 2022-06-06 – 2022-09-07 (×6): qty 60, 30d supply, fill #0
  Filled 2022-11-30: qty 60, 30d supply, fill #1

## 2022-06-06 MED ORDER — BUPROPION HCL ER (XL) 150 MG PO TB24
150.0000 mg | ORAL_TABLET | ORAL | 1 refills | Status: DC
  Filled 2022-06-06 – 2023-02-06 (×6): qty 30, 30d supply, fill #0

## 2022-06-06 MED ORDER — DIVALPROEX SODIUM 250 MG PO DR TAB
250.0000 mg | DELAYED_RELEASE_TABLET | Freq: Two times a day (BID) | ORAL | 1 refills | Status: DC
  Filled 2022-06-06 – 2022-09-07 (×6): qty 60, 30d supply, fill #0
  Filled 2022-11-30: qty 60, 30d supply, fill #1

## 2022-06-06 MED ORDER — ARIPIPRAZOLE 5 MG PO TABS
5.0000 mg | ORAL_TABLET | Freq: Every day | ORAL | 1 refills | Status: DC
  Filled 2022-06-06 – 2022-09-07 (×5): qty 30, 30d supply, fill #0

## 2022-06-06 NOTE — Progress Notes (Signed)
BH MD/PA/NP OP Progress Note  06/06/2022 2:28 PM Jordan Sparks  MRN:  [id number]  Visit Diagnosis:    ICD-10-CM   1. Bipolar 1 disorder, mixed, moderate (HCC)  F31.62 ARIPiprazole (ABILIFY) 5 MG tablet    buPROPion (WELLBUTRIN XL) 150 MG 24 hr tablet    divalproex (DEPAKOTE) 250 MG DR tablet    DULoxetine (CYMBALTA) 60 MG capsule    2. Insomnia, unspecified type  G47.00       Assessment: Jordan Sparks is a 47 y.o. male with a history of Depression Bipolar disorder who presented to [place] at [facility] for initial evaluation on 12/06/21.    Patient presented at initial evaluation reporting struggling with depressed mood, anhedonia, amotivation, insomnia, and negative self thoughts for the last year. He denied any SI/HI or thoughts of self harm. Patient did endorse experiencing auditory and visual hallucinations multiple times a week which are occasionally command in nature. He has noticed a decrease in the voices since starting Lithium. Of note patient endorsed a history of bipolar disorder including manic episodes where he had decreased sleep, increased energy, increased irritability.  Based on patients past history and symptoms he meets criteria for Bipolar disorder mixed, current episode depressed.   Jordan Sparks presents for follow-up evaluation. Today, 06/06/22, patient reports that his mood symptoms have improved and remained fairly stable over the past several months.  There has been some increased mood lability over the past couple weeks since he stopped his Abilify secondary to insurance/financial constraints.  Patient plans to restart this medication in the next week or 2 once the financial situation is resolved.  He denies any adverse side effects from the medication and reports an improvement in the auditory hallucinations.  Plan:  - Start Depakote 250 mg BID - Continue Wellbutrin to XL 150 mg QD - Continue Cymbalta 60 mg BID -  Continue Abilify 5 mg QD - Continue Gabapentin 600 QAM and 300 mg QHS managed by PCP for neuropathic pain - Discontinued Lithium - CBC, CMP WNL - Utox positive for cannabis use - Lipid Panel ordered on 12/06/21 - Crisis resources discussed -Will work on return to work letter for [employer] and plan to email it to Jordan Sparks - Follow up 1-2 months  Chief Complaint:  Chief Complaint  Patient presents with   Follow-up   HPI: Jordan Sparks presents reporting that the last 4-5 months thinks have been alright for him.  He notes that he is still trying to heal and monitor his cardiac health through diet and exercise.  On the psychiatric side of things, he reports that it has been a bit more stable.  He has discontinued the lithium and is taking Depakote, Wellbutrin, and Cymbalta.  He had also been taking the Abilify however discontinued it recently due to issues with insurance.  He notes that the issue is around him being behind on paying for his insurance thus they are not covering his medications until he catches up.  He believes he should be caught up in the next week or 2 and is open to paying for the Abilify out of pocket if needed.  Off the Abilify he notes that there has been some increase in mood fluctuation where he can have some good days and bad days.  He describes the bad days as being more down, tearful, and more negative.  He denies any thoughts of self-harm or suicide and instead describes the negative thoughts is more along the lines of how he should be  contributing more financially to the household.  Patient denies any auditory hallucinations and notes that the voices he had experienced have been well-controlled.  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  This line of work was discussed with him and how it could impact his mental health.  Patient notes  that he does not intend to get into long distance trucking.  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  Past Psychiatric History: Patient has a past psychiatric history of Bipolar disorder and depression. He has one prior inpatient treatment at [facility] in 2012 or 2013 for depressed mood and irritability. Patient has also been connected with therapists in the past in 2012, 2013, 2014 through [facility].    He has been on Lithium, Prozac, Wellbutrin, BuSpar, Trazodone, and gabapentin in the past. Started on Cymbalta on 12/06/21.   Started Depakote on 01/05/22  Patient reports EtOH use started around age 29 but has decreased to social use now. He endorses occasional marijuana use.  Past Medical History:  Past Medical History:  Diagnosis Date   Anxiety    Asthma    CHF (congestive heart failure) ([place])    Coronary artery disease    Depression    Diabetes mellitus type 2 in obese ([place])    Hypertension    Manic disorder ([place])    Obesity    Sleep apnea     Past Surgical History:  Procedure Laterality Date   CARPAL TUNNEL RELEASE Right 12/19/2021   Procedure: RIGHT CARPAL TUNNEL RELEASE;  Surgeon: Marybelle Killings, MD;  Location: [facility];  Service: Orthopedics;  Laterality: Right;   Dental procedure      Family Psychiatric History: Bipolar disorder in half the men of his family  Family History:  Family History  Problem Relation Age of Onset   Seizures Brother    Heart failure Neg Hx     Social History:  Social History   Socioeconomic History   Marital status: Married    Spouse name: Jordan Sparks   Number of children: 2   Years of education: Not on file   Highest education level: Associate degree: academic program  Occupational History   Occupation: truck Geophysicist/field seismologist    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, Administrator  Tobacco Use   Smoking status: Former    Packs/day: 0.25    Years: 32.00     Additional pack years: 0.00    Total pack years: 8.00    Types: Cigarettes    Quit date: 04/2021    Years since quitting: 1.1   Smokeless tobacco: Never  Vaping Use   Vaping Use: Never used  Substance and Sexual Activity   Alcohol use: Yes    Comment: occasional   Drug use: Yes    Types: Marijuana    Comment: routinely   Sexual activity: Not on file  Other Topics Concern   Not on file  Social History Narrative   Not on file   Social Determinants of Health   Financial Resource Strain: Low Risk  (06/02/2021)   Overall Financial Resource Strain (CARDIA)    Difficulty of Paying Living Expenses: Not hard at all  Food Insecurity: No Food Insecurity (06/02/2021)   Hunger Vital Sign    Worried About Running Out of Food  in the Last Year: Never true    [place] in the Last Year: Never true  Transportation Needs: No Transportation Needs (06/02/2021)   PRAPARE - Hydrologist (Medical): No    Lack of Transportation (Non-Medical): No  Physical Activity: Not on file  Stress: Not on file  Social Connections: Not on file    Allergies:  Allergies  Allergen Reactions   Lisinopril Cough    Current Medications: Current Outpatient Medications  Medication Sig Dispense Refill   amLODipine (NORVASC) 5 MG tablet Take 1 tablet (5 mg total) by mouth daily. 30 tablet 1   ARIPiprazole (ABILIFY) 5 MG tablet Take 1 tablet (5 mg total) by mouth daily. 30 tablet 1   aspirin 81 MG chewable tablet Chew 1 tablet (81 mg total) by mouth daily. 30 tablet 11   blood glucose meter kit and supplies KIT Dispense based on patient and insurance preference. Use up to four times daily as directed. (FOR ICD-9 250.00, 250.01). 1 each 0   buPROPion (WELLBUTRIN XL) 150 MG 24 hr tablet Take 1 tablet (150 mg total) by mouth every morning. 30 tablet 1   carvedilol (COREG) 25 MG tablet Take 1 tablet (25 mg total) by mouth 2 (two) times daily. 60 tablet 1   cetirizine (ZYRTEC) 10 MG  tablet Take 1 tablet (10 mg total) by mouth daily. 100 tablet 0   dapagliflozin propanediol (FARXIGA) 10 MG TABS tablet Take 1 tablet (10 mg total) by mouth daily. 30 tablet 6   divalproex (DEPAKOTE) 250 MG DR tablet Take 1 tablet (250 mg total) by mouth 2 (two) times daily. 60 tablet 1   DULoxetine (CYMBALTA) 60 MG capsule Take 1 capsule (60 mg total) by mouth 2 (two) times daily. 60 capsule 2   eplerenone (INSPRA) 25 MG tablet Take 1 tablet (25 mg total) by mouth daily. Please contact the office to schedule appointment for additional refills. 90 tablet 1   fluticasone (FLONASE) 50 MCG/ACT nasal spray Place 2 sprays into both nostrils daily as needed for allergies. 16 g 1   furosemide (LASIX) 20 MG tablet Take 1 tablet (20 mg total) by mouth 2 (two) times daily. 60 tablet 1   gabapentin (NEURONTIN) 300 MG capsule Take 1 capsule (300 mg total) by mouth 2 (two) times daily. 60 capsule 0   lithium carbonate 150 MG capsule Take 1 capsule (150 mg total) by mouth 2 (two) times daily for 3 days, then discontinue. (Patient not taking: Reported on 04/27/2022) 6 capsule 0   metFORMIN (GLUCOPHAGE) 1000 MG tablet Take 1 tablet (1,000 mg total) by mouth 2 (two) times daily. 180 tablet 1   Multiple Vitamin (MULTIVITAMIN WITH MINERALS) TABS tablet Take 1 tablet by mouth daily. 100 tablet 0   NON FORMULARY Pt uses a cpap nightly     potassium chloride SA (KLOR-CON M) 20 MEQ tablet Take 2 tablets (40 mEq total) by mouth daily. 60 tablet 3   rosuvastatin (CRESTOR) 20 MG tablet Take 1 tablet (20 mg total) by mouth daily. 90 tablet 3   sacubitril-valsartan (ENTRESTO) 97-103 MG Take 1 tablet by mouth 2 (two) times daily. 60 tablet 6   terbinafine (LAMISIL) 250 MG tablet Take 1 tablet (250 mg total) by mouth daily. 30 tablet 0   Vitamin D, Ergocalciferol, (DRISDOL) 1.25 MG (50000 UNIT) CAPS capsule Take 1 capsule (50,000 Units total) by mouth once a week. 30 capsule 0   No current facility-administered medications for  this visit.     Psychiatric Specialty Exam: Review of Systems  There were no vitals taken for this visit.There is no height or weight on file to calculate BMI.  General Appearance: Fairly Groomed  Eye Contact:  Good  Speech:  Clear and Coherent and Normal Rate  Volume:  Normal  Mood:  Euthymic  Affect:  Congruent  Thought Process:  Coherent and Goal Directed  Orientation:  Full (Time, Place, and Person)  Thought Content: Logical   Suicidal Thoughts:  No  Homicidal Thoughts:  No  Memory:  NA  Judgement:  Good  Insight:  Fair  Psychomotor Activity:  Normal  Concentration:  Concentration: Good  Recall:  Good  Fund of Knowledge: Fair  Language: Good  Akathisia:  NA    AIMS (if indicated): not done  Assets:  Communication Skills Desire for Improvement Housing  ADL's:  Intact  Cognition: WNL  Sleep:  Fair   Metabolic Disorder Labs: Lab Results  Component Value Date   HGBA1C 7.0 (H) 08/29/2021   MPG 257.52 02/09/2018   No results found for: "PROLACTIN" Lab Results  Component Value Date   CHOL 189 05/04/2021   TRIG 106 05/04/2021   HDL 54 05/04/2021   CHOLHDL 3.5 05/04/2021   VLDL 37 02/09/2018   LDLCALC 116 (H) 05/04/2021   [place] 81 02/09/2018   Lab Results  Component Value Date   TSH 1.130 05/04/2021    Therapeutic Level Labs: Lab Results  Component Value Date   LITHIUM <0.06 (L) 02/08/2018   No results found for: "VALPROATE" No results found for: "CBMZ"   Screenings: GAD-7    Flowsheet Row Video Visit from 12/09/2021 in [facility] at [facility] from 08/17/2021 in [place] at [facility]  Total GAD-7 Score 0 6      PHQ2-9    [place] Video Visit from 12/09/2021 in [place] [facility] [place] Visit from 12/06/2021 in [facility] Office Visit from 09/29/2021 in [facility] at [facility] from 08/29/2021 in [facility] at [facility] from 08/17/2021 in [facility] at [facility] Total Score 0 4 0 1 2  PHQ-9 Total Score 0 11 0 5 [place] ED from 01/13/2022 in [facility] Admission (Discharged) from 12/19/2021 in [facility] from 12/06/2021 in [facility] No Risk No Risk No Risk       Collaboration of Care: Collaboration of Care: Medication Management AEB medication prescription and Primary Care Provider AEB chart review  Patient/Guardian was advised Release of Information must be obtained prior to any record release in order to collaborate their care with an outside provider. Patient/Guardian was advised if they have not already done so to contact the registration department to sign all necessary forms in order for [place] to release information regarding their care.   Consent: Patient/Guardian gives verbal consent for treatment and assignment of benefits for services provided during this visit. Patient/Guardian expressed understanding and agreed to proceed.    Vista Mink, MD 06/06/2022, 2:28 PM   Virtual Visit via Video Note  I connected with Jordan Sparks on 06/06/22 at  2:00 PM EDT by a video enabled  telemedicine application and verified that I am speaking with the correct person using two identifiers.  Location: Patient: Home Provider: Home Office   I discussed the limitations of evaluation and management by telemedicine and the availability of in person appointments. The patient expressed understanding and agreed to proceed.   I discussed the assessment and treatment plan with the patient. The patient was provided an opportunity to ask questions and all were answered. The patient agreed with  the plan and demonstrated an understanding of the instructions.   The patient was advised to call back or seek an in-person evaluation if the symptoms worsen or if the condition fails to improve as anticipated.  I provided 20 minutes of non-face-to-face time during this encounter.   Vista Mink, MD

## 2022-06-07 ENCOUNTER — Other Ambulatory Visit (HOSPITAL_COMMUNITY): Payer: Self-pay

## 2022-06-07 ENCOUNTER — Encounter (HOSPITAL_COMMUNITY): Payer: Self-pay

## 2022-06-07 ENCOUNTER — Other Ambulatory Visit: Payer: Self-pay

## 2022-06-12 ENCOUNTER — Other Ambulatory Visit: Payer: Self-pay

## 2022-07-20 ENCOUNTER — Encounter (HOSPITAL_COMMUNITY): Payer: Commercial Managed Care - HMO | Admitting: Psychiatry

## 2022-07-20 NOTE — Progress Notes (Signed)
This encounter was created in error - please disregard.

## 2022-07-24 ENCOUNTER — Encounter: Payer: Self-pay | Admitting: Physician Assistant

## 2022-07-24 ENCOUNTER — Ambulatory Visit (INDEPENDENT_AMBULATORY_CARE_PROVIDER_SITE_OTHER): Payer: Commercial Managed Care - HMO | Admitting: Physician Assistant

## 2022-07-24 ENCOUNTER — Other Ambulatory Visit (HOSPITAL_BASED_OUTPATIENT_CLINIC_OR_DEPARTMENT_OTHER): Payer: Self-pay

## 2022-07-24 DIAGNOSIS — M5440 Lumbago with sciatica, unspecified side: Secondary | ICD-10-CM | POA: Diagnosis not present

## 2022-07-24 MED ORDER — METHOCARBAMOL 500 MG PO TABS
500.0000 mg | ORAL_TABLET | Freq: Four times a day (QID) | ORAL | 0 refills | Status: DC | PRN
  Filled 2022-07-24: qty 30, 8d supply, fill #0

## 2022-07-24 NOTE — Progress Notes (Unsigned)
Office Visit Note   Patient: Jordan Sparks           Date of Birth: [birth year]           MRN: [id number] Visit Date: 07/24/2022              Requested by: de Peru, Raymond J, MD [address] [place],  [place] [address] PCP: de Peru, Raymond J, MD  Chief Complaint  Patient presents with   Lower Back - Pain   Right Hip - Pain      HPI: Patient is a pleasant 47 year old gentleman who I seen in the past who has a history of low back pain and right hip pain.  He was also followed by Dr. Cleophas Dunker.  He has had injections both into his hip and his lower back.  Last back injection being in February with Dr. Alvester Morin.  This was done at the L5-S1 level interlaminar.  He does feel like he gets good relief with those injections.  He also has gotten some intra-articular injections with Dr. Shon Baton as some of his pain seem to be related to the hip.  He admits he has not gotten any improvement.  He rates the pain as moderate plus today with spasms in his buttocks and pain in the right lower back no real groin pain.  Assessment & Plan: Visit Diagnoses: Low back pain  Plan: Certainly he has some radicular findings today.  I will rerefer him for 1 more injection with Dr. Alvester Morin.  If this does not relieve any significant pain in his hip then I would recommend an MRI of the hip.  He also has some right lower flank pain but does not have any hematuria or other symptoms.  That being said I have asked him to follow-up with his primary care physician to rule out any other causes for this pain including renal calculi.  Follow-Up Instructions: With Dr. Franchot Heidelberg Exam  Patient is alert, oriented, no adenopathy, well-dressed, normal affect, normal respiratory effort. Examination of his lower back he has no redness no erythema no real pain to percussion over the the right lower side of his back.  He has 5 out of 5 strength with resisted dorsiflexion and plantarflexion of his ankles extension and  flexion of his legs.  He has some altered sensation subjectively.  No pedal pain today in the groin or with manipulation of his hip.  Imaging: No results found. No images are attached to the encounter.  Labs: Lab Results  Component Value Date   HGBA1C 7.0 (H) 08/29/2021   HGBA1C 7.7 (H) 05/04/2021   HGBA1C 10.6 (H) 02/09/2018   REPTSTATUS 02/13/2018 FINAL 02/08/2018   CULT  02/08/2018    NO GROWTH 5 DAYS Performed at [facility], [address] [place], [place] [address]      Lab Results  Component Value Date   ALBUMIN 4.1 09/20/2021   ALBUMIN 4.0 09/09/2021   ALBUMIN 4.7 05/04/2021    Lab Results  Component Value Date   MG 2.1 06/02/2021   MG 1.9 02/09/2018   No results found for: "VD25OH"  No results found for: "PREALBUMIN"    Latest Ref Rng & Units 09/20/2021    4:20 PM 06/03/2021    4:18 AM 06/01/2021    3:39 AM  CBC EXTENDED  WBC 4.0 - 10.5 K/uL 17.8  10.9  8.4   RBC 4.22 - 5.81 MIL/uL 5.35  5.75  5.10   Hemoglobin 13.0 -  17.0 g/dL 16.1  09.6  04.5   HCT 39.0 - 52.0 % 43.2  46.1  40.4   Platelets 150 - 400 K/uL 341  251  237   NEUT# 1.7 - 7.7 K/uL 13.7     Lymph# 0.7 - 4.0 K/uL 2.6        There is no height or weight on file to calculate BMI.  Orders:  No orders of the defined types were placed in this encounter.  Meds ordered this encounter  Medications   methocarbamol (ROBAXIN) 500 MG tablet    Sig: Take 1 tablet (500 mg total) by mouth every 6 (six) hours as needed for muscle spasms.    Dispense:  30 tablet    Refill:  0     Procedures: No procedures performed  Clinical Data: No additional findings.  ROS:  All other systems negative, except as noted in the HPI. Review of Systems  Objective: Vital Signs: There were no vitals taken for this visit.  Specialty Comments:  MRI LUMBAR SPINE WITHOUT CONTRAST   TECHNIQUE: Multiplanar, multisequence MR imaging of the lumbar spine was performed. No intravenous contrast was  administered.   COMPARISON:  Lumbar spine radiographs 04/14/2021. CTA chest, abdomen, and pelvis 06/18/2020.   FINDINGS: The study is motion degraded, moderately so on the axial sequences.   Segmentation: Standard.   Alignment: Trace retrolisthesis of L4 on L5. Straightening of the normal lumbar lordosis.   Vertebrae: No fracture, suspicious marrow lesion, or significant marrow edema. Mild chronic degenerative endplate changes at L5-S1.   Conus medullaris and cauda equina: Conus extends to the L1-2 level. Conus and cauda equina appear normal.   Paraspinal and other soft tissues: Unremarkable.   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   T12-L1 and L1-2: Negative.   L2-3: Minimal disc bulging without stenosis.   L3-4: Mild disc bulging without significant stenosis.   L4-5: Mild disc desiccation. Circumferential disc bulging and mild facet hypertrophy result in mild spinal stenosis, mild bilateral lateral recess stenosis, and mild-to-moderate bilateral neural foraminal stenosis.   L5-S1: Disc desiccation. Disc bulging and mild facet hypertrophy result in mild right and moderate left neural foraminal stenosis without spinal stenosis.   IMPRESSION: 1. Motion degraded examination. 2. 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     Electronically Signed   By: Sebastian Ache M.D.   On: 05/16/2021 18:00  PMFS History: Patient Active Problem List   Diagnosis Date Noted   Trigger thumb, right thumb 04/18/2022   Bipolar 1 disorder, mixed, moderate (HCC) 12/06/2021   Insomnia 12/06/2021   Encounter for long-term (current) use of medications 12/06/2021   Unilateral primary osteoarthritis, right hip 11/30/2021   Carpal tunnel syndrome, right upper limb 11/30/2021   Low back pain  10/11/2021   Peripheral neuropathy 10/11/2021   Pain in left shoulder 08/31/2021   Spinal stenosis of cervical region 06/09/2021   Nonischemic cardiomyopathy (HCC) 06/06/2021   Chest pain 06/01/2021   Chest pain of uncertain etiology 05/31/2021   Obesity, Class III, BMI 40-49.9 (morbid obesity) (HCC) 05/31/2021   Diabetes mellitus (HCC) 05/04/2021   Sleep apnea 05/04/2021   Hypertensive urgency 10/19/2020   GERD (gastroesophageal reflux disease) 02/09/2018   Hyperglycemia 02/08/2018   Atypical chest pain 02/08/2018   Hypokalemia 02/08/2018   Leukocytosis 02/08/2018   Nausea and  vomiting 02/08/2018   Mood disorder (HCC)    Hypertension    Anxiety    Hyperglycemia without ketosis    Manic disorder (HCC)    Past Medical History:  Diagnosis Date   Anxiety    Asthma    CHF (congestive heart failure) (HCC)    Coronary artery disease    Depression    Diabetes mellitus type 2 in obese    Hypertension    Manic disorder (HCC)    Obesity    Sleep apnea     Family History  Problem Relation Age of Onset   Seizures Brother    Heart failure Neg Hx     Past Surgical History:  Procedure Laterality Date   CARPAL TUNNEL RELEASE Right 12/19/2021   Procedure: RIGHT CARPAL TUNNEL RELEASE;  Surgeon: Eldred Manges, MD;  Location: [facility];  Service: Orthopedics;  Laterality: Right;   Dental procedure     Social History   Occupational History   Occupation: truck Hospital doctor    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, [employer]  Tobacco Use   Smoking status: Former    Packs/day: 0.25    Years: 32.00    Additional pack years: 0.00    Total pack years: 8.00    Types: Cigarettes    Quit date: 04/2021    Years since quitting: 1.2   Smokeless tobacco: Never  Vaping Use   Vaping Use: Never used  Substance and Sexual Activity   Alcohol use: Yes    Comment: occasional   Drug use: Yes    Types: Marijuana    Comment: routinely   Sexual activity: Not on file

## 2022-08-04 ENCOUNTER — Other Ambulatory Visit (HOSPITAL_BASED_OUTPATIENT_CLINIC_OR_DEPARTMENT_OTHER): Payer: Self-pay | Admitting: Family Medicine

## 2022-08-05 ENCOUNTER — Other Ambulatory Visit (HOSPITAL_BASED_OUTPATIENT_CLINIC_OR_DEPARTMENT_OTHER): Payer: Self-pay | Admitting: Family Medicine

## 2022-08-05 ENCOUNTER — Other Ambulatory Visit (HOSPITAL_BASED_OUTPATIENT_CLINIC_OR_DEPARTMENT_OTHER): Payer: Self-pay

## 2022-08-07 ENCOUNTER — Other Ambulatory Visit: Payer: Self-pay

## 2022-08-08 ENCOUNTER — Other Ambulatory Visit (HOSPITAL_BASED_OUTPATIENT_CLINIC_OR_DEPARTMENT_OTHER): Payer: Self-pay

## 2022-08-08 MED ORDER — CETIRIZINE HCL 10 MG PO TABS
10.0000 mg | ORAL_TABLET | Freq: Every day | ORAL | 0 refills | Status: DC
  Filled 2022-08-08: qty 100, 100d supply, fill #0
  Filled 2022-09-07 (×2): qty 30, 30d supply, fill #0

## 2022-08-14 ENCOUNTER — Other Ambulatory Visit (HOSPITAL_BASED_OUTPATIENT_CLINIC_OR_DEPARTMENT_OTHER): Payer: Self-pay

## 2022-08-18 ENCOUNTER — Other Ambulatory Visit (HOSPITAL_BASED_OUTPATIENT_CLINIC_OR_DEPARTMENT_OTHER): Payer: Self-pay

## 2022-08-22 ENCOUNTER — Telehealth (HOSPITAL_BASED_OUTPATIENT_CLINIC_OR_DEPARTMENT_OTHER): Payer: Self-pay | Admitting: Family Medicine

## 2022-08-22 NOTE — Telephone Encounter (Signed)
mailbox full, unable to lvm to schedule diabetic retina screening 08/31/22

## 2022-09-07 ENCOUNTER — Other Ambulatory Visit (HOSPITAL_COMMUNITY): Payer: Self-pay

## 2022-09-07 ENCOUNTER — Ambulatory Visit (HOSPITAL_COMMUNITY)
Admission: EM | Admit: 2022-09-07 | Discharge: 2022-09-07 | Disposition: A | Discharge status: Home / Self Care | Payer: Medicaid Other | Attending: Internal Medicine | Admitting: Internal Medicine

## 2022-09-07 ENCOUNTER — Other Ambulatory Visit (HOSPITAL_BASED_OUTPATIENT_CLINIC_OR_DEPARTMENT_OTHER): Payer: Self-pay

## 2022-09-07 ENCOUNTER — Telehealth: Payer: Self-pay | Admitting: Physical Medicine and Rehabilitation

## 2022-09-07 ENCOUNTER — Encounter (HOSPITAL_COMMUNITY): Payer: Self-pay | Admitting: *Deleted

## 2022-09-07 DIAGNOSIS — I160 Hypertensive urgency: Secondary | ICD-10-CM | POA: Diagnosis not present

## 2022-09-07 DIAGNOSIS — I428 Other cardiomyopathies: Secondary | ICD-10-CM | POA: Insufficient documentation

## 2022-09-07 DIAGNOSIS — M5440 Lumbago with sciatica, unspecified side: Secondary | ICD-10-CM | POA: Diagnosis not present

## 2022-09-07 DIAGNOSIS — E119 Type 2 diabetes mellitus without complications: Secondary | ICD-10-CM | POA: Insufficient documentation

## 2022-09-07 LAB — COMPREHENSIVE METABOLIC PANEL
ALT: 24 U/L (ref 0–44)
AST: 14 U/L — ABNORMAL LOW (ref 15–41)
Albumin: 3.4 g/dL — ABNORMAL LOW (ref 3.5–5.0)
Alkaline Phosphatase: 86 U/L (ref 38–126)
Anion gap: 9 (ref 5–15)
BUN: 9 mg/dL (ref 6–20)
CO2: 26 mmol/L (ref 22–32)
Calcium: 8.9 mg/dL (ref 8.9–10.3)
Chloride: 103 mmol/L (ref 98–111)
Creatinine, Ser: 0.9 mg/dL (ref 0.61–1.24)
GFR, Estimated: >60 mL/min (ref >60)
Glucose, Bld: 202 mg/dL — ABNORMAL HIGH (ref 70–99)
Potassium: 3.4 mmol/L — ABNORMAL LOW (ref 3.5–5.1)
Sodium: 138 mmol/L (ref 135–145)
Total Bilirubin: 0.6 mg/dL (ref 0.3–1.2)
Total Protein: 6.8 g/dL (ref 6.5–8.1)

## 2022-09-07 LAB — CBC WITH DIFFERENTIAL/PLATELET
Abs Immature Granulocytes: 0.02 10*3/uL (ref 0.00–0.07)
Basophils Absolute: 0 10*3/uL (ref 0.0–0.1)
Basophils Relative: 1 %
Eosinophils Absolute: 0.1 10*3/uL (ref 0.0–0.5)
Eosinophils Relative: 1 %
HCT: 41.8 % (ref 39.0–52.0)
Hemoglobin: 13.8 g/dL (ref 13.0–17.0)
Immature Granulocytes: 0 %
Lymphocytes Relative: 23 %
Lymphs Abs: 1.6 10*3/uL (ref 0.7–4.0)
MCH: 26.8 pg (ref 26.0–34.0)
MCHC: 33 g/dL (ref 30.0–36.0)
MCV: 81.3 fL (ref 80.0–100.0)
Monocytes Absolute: 0.4 10*3/uL (ref 0.1–1.0)
Monocytes Relative: 6 %
Neutro Abs: 4.8 10*3/uL (ref 1.7–7.7)
Neutrophils Relative %: 69 %
Platelets: 274 10*3/uL (ref 150–400)
RBC: 5.14 MIL/uL (ref 4.22–5.81)
RDW: 13.7 % (ref 11.5–15.5)
WBC: 7 10*3/uL (ref 4.0–10.5)
nRBC: 0 % (ref 0.0–0.2)

## 2022-09-07 MED ORDER — CARVEDILOL 25 MG PO TABS
25.0000 mg | ORAL_TABLET | Freq: Two times a day (BID) | ORAL | 2 refills | Status: DC
  Filled 2022-09-07 (×2): qty 180, 90d supply, fill #0

## 2022-09-07 MED ORDER — DAPAGLIFLOZIN PROPANEDIOL 10 MG PO TABS
10.0000 mg | ORAL_TABLET | Freq: Every day | ORAL | 6 refills | Status: DC
  Filled 2022-09-07 (×2): qty 30, 30d supply, fill #0
  Filled 2022-10-08 – 2022-11-30 (×5): qty 30, 30d supply, fill #1

## 2022-09-07 MED ORDER — GLUCOSE BLOOD VI STRP
ORAL_STRIP | 0 refills | Status: DC
  Filled 2022-09-07: qty 100, 25d supply, fill #0

## 2022-09-07 MED ORDER — ROSUVASTATIN CALCIUM 20 MG PO TABS
20.0000 mg | ORAL_TABLET | Freq: Every day | ORAL | 3 refills | Status: DC
  Filled 2022-09-07 (×2): qty 90, 90d supply, fill #0

## 2022-09-07 MED ORDER — METHOCARBAMOL 500 MG PO TABS
500.0000 mg | ORAL_TABLET | Freq: Every evening | ORAL | 1 refills | Status: DC | PRN
  Filled 2022-09-07 (×2): qty 30, 30d supply, fill #0

## 2022-09-07 MED ORDER — EPLERENONE 25 MG PO TABS
25.0000 mg | ORAL_TABLET | Freq: Every day | ORAL | 2 refills | Status: DC
  Filled 2022-09-07 (×2): qty 30, 30d supply, fill #0

## 2022-09-07 MED ORDER — ACCU-CHEK SOFTCLIX LANCETS MISC
0 refills | Status: DC
  Filled 2022-09-07: qty 100, 25d supply, fill #0

## 2022-09-07 MED ORDER — METFORMIN HCL 1000 MG PO TABS
1000.0000 mg | ORAL_TABLET | Freq: Two times a day (BID) | ORAL | 1 refills | Status: DC
  Filled 2022-09-07 (×2): qty 180, 90d supply, fill #0

## 2022-09-07 MED ORDER — NAPROXEN 375 MG PO TABS
375.0000 mg | ORAL_TABLET | Freq: Two times a day (BID) | ORAL | 0 refills | Status: DC
  Filled 2022-09-07 – 2022-11-30 (×3): qty 30, 15d supply, fill #0

## 2022-09-07 MED ORDER — BLOOD GLUCOSE MONITOR KIT: PACK | 0 refills | Status: DC

## 2022-09-07 MED ORDER — ARIPIPRAZOLE 5 MG PO TABS
5.0000 mg | ORAL_TABLET | Freq: Every day | ORAL | 2 refills | Status: DC
  Filled 2022-09-07: qty 90, 90d supply, fill #0
  Filled 2022-09-07 (×2): qty 30, 30d supply, fill #0
  Filled 2023-01-01: qty 30, 30d supply, fill #1

## 2022-09-07 MED ORDER — ASPIRIN 81 MG PO CHEW
81.0000 mg | CHEWABLE_TABLET | Freq: Every day | ORAL | 2 refills | Status: DC
  Filled 2022-09-07: qty 90, 90d supply, fill #0
  Filled 2022-09-07: qty 30, 30d supply, fill #0

## 2022-09-07 MED ORDER — BLOOD GLUCOSE MONITOR KIT
PACK | 0 refills | Status: DC
  Filled 2022-09-07 (×2): qty 1, 30d supply, fill #0

## 2022-09-07 MED ORDER — AMLODIPINE BESYLATE 10 MG PO TABS
10.0000 mg | ORAL_TABLET | Freq: Every day | ORAL | 2 refills | Status: DC
  Filled 2022-09-07 (×2): qty 90, 90d supply, fill #0

## 2022-09-07 NOTE — ED Provider Notes (Signed)
[facility]    CSN: [id number] Arrival date & time: 09/07/22  0850      History   Chief Complaint Chief Complaint  Patient presents with   Back Pain   Foot Pain   Medication Refill    HPI Jordan Sparks is a 47 y.o. male with a history of hypertension, diabetes mellitus type 2 and back pain comes to urgent care for evaluation.  Patient has a history of chronic back pain which is worsened over the past several days.  Discomfort is associated with spasms over the lower back.  Spasms is worse after work.  He denies any relieving factors.  He has run out of muscle relaxants.  No recent falls.  He is working involves repetitive movement.  He denies any weakness in the lower extremities.  The pain radiates into both legs.  MRI done a year ago which shows L4-L5 degenerative disc disease.  Patient denies any urinary or bowel problems.  Patient is hypertensive and diabetic.  His blood pressure has been elevated.  He has run out of his antihypertensive medications.  He ran out of them because of insurance issues.  He has regained his insurance back and would like his medications to be refilled.  He has a mild headache but denies any chest pain or abdominal pain.  No dizziness or near syncopal episodes.   HPI  Past Medical History:  Diagnosis Date   Anxiety    Asthma    CHF (congestive heart failure) (HCC)    Coronary artery disease    Depression    Diabetes mellitus type 2 in obese    Hypertension    Manic disorder (HCC)    Obesity    Sleep apnea     Patient Active Problem List   Diagnosis Date Noted   Trigger thumb, right thumb 04/18/2022   Bipolar 1 disorder, mixed, moderate (HCC) 12/06/2021   Insomnia 12/06/2021   Encounter for long-term (current) use of medications 12/06/2021   Unilateral primary osteoarthritis, right hip 11/30/2021   Carpal tunnel syndrome, right upper limb 11/30/2021   Low back pain 10/11/2021   Peripheral neuropathy 10/11/2021   Pain  in left shoulder 08/31/2021   Spinal stenosis of cervical region 06/09/2021   Nonischemic cardiomyopathy (HCC) 06/06/2021   Chest pain 06/01/2021   Chest pain of uncertain etiology 05/31/2021   Obesity, Class III, BMI 40-49.9 (morbid obesity) (HCC) 05/31/2021   Diabetes mellitus (HCC) 05/04/2021   Sleep apnea 05/04/2021   Hypertensive urgency 10/19/2020   GERD (gastroesophageal reflux disease) 02/09/2018   Hyperglycemia 02/08/2018   Atypical chest pain 02/08/2018   Hypokalemia 02/08/2018   Leukocytosis 02/08/2018   Nausea and vomiting 02/08/2018   Mood disorder (HCC)    Hypertension    Anxiety    Hyperglycemia without ketosis    Manic disorder (HCC)     Past Surgical History:  Procedure Laterality Date   CARPAL TUNNEL RELEASE Right 12/19/2021   Procedure: RIGHT CARPAL TUNNEL RELEASE;  Surgeon: Eldred Manges, MD;  Location: [facility];  Service: Orthopedics;  Laterality: Right;   Dental procedure         Home Medications    Prior to Admission medications   Medication Sig Start Date End Date Taking? Authorizing Provider  amLODipine (NORVASC) 10 MG tablet Take 1 tablet (10 mg total) by mouth daily. 09/07/22  Yes Conchita Truxillo, Britta Mccreedy, MD  ARIPiprazole (ABILIFY) 5 MG tablet Take 1 tablet (5 mg total) by mouth daily. 09/07/22  Yes Mkayla Steele, Britta Mccreedy, MD  carvedilol (COREG) 25 MG tablet Take 1 tablet (25 mg total) by mouth 2 (two) times daily with a meal. 09/07/22 12/06/22 Yes Daeshawn Redmann, Britta Mccreedy, MD  eplerenone (INSPRA) 25 MG tablet Take 1 tablet (25 mg total) by mouth daily. 09/07/22 12/06/22 Yes Oretta Berkland, Britta Mccreedy, MD  naproxen (NAPROSYN) 375 MG tablet Take 1 tablet (375 mg total) by mouth 2 (two) times daily. 09/07/22  Yes Anam Bobby, Britta Mccreedy, MD  Accu-Chek Softclix Lancets lancets Use up to 4 times daily as directed 09/07/22   Merrilee Jansky, MD  aspirin 81 MG chewable tablet Chew 1 tablet (81 mg total) by mouth daily. 09/07/22   LampteyBritta Mccreedy, MD  blood glucose meter kit  and supplies KIT Use up to four times daily as directed. 09/07/22   Merrilee Jansky, MD  buPROPion (WELLBUTRIN XL) 150 MG 24 hr tablet Take 1 tablet (150 mg total) by mouth every morning. 06/06/22 06/06/23  Stasia Cavalier, MD  carvedilol (COREG) 25 MG tablet Take 1 tablet (25 mg total) by mouth 2 (two) times daily. 04/24/22 07/23/22  Corrin Parker, PA-C  cetirizine (ZYRTEC) 10 MG tablet Take 1 tablet (10 mg total) by mouth daily. 08/08/22   de [place], Buren Kos, MD  dapagliflozin propanediol (FARXIGA) 10 MG TABS tablet Take 1 tablet (10 mg total) by mouth daily. 09/07/22   Merrilee Jansky, MD  divalproex (DEPAKOTE) 250 MG DR tablet Take 1 tablet (250 mg total) by mouth 2 (two) times daily. 06/06/22   Stasia Cavalier, MD  DULoxetine (CYMBALTA) 60 MG capsule Take 1 capsule (60 mg total) by mouth 2 (two) times daily. 06/06/22   Stasia Cavalier, MD  fluticasone [facility]) 50 MCG/ACT nasal spray Place 2 sprays into both nostrils daily as needed for allergies. 02/06/22   de [place], Buren Kos, MD  furosemide (LASIX) 20 MG tablet Take 1 tablet (20 mg total) by mouth 2 (two) times daily. 03/15/22   de [place], Raymond J, MD  gabapentin (NEURONTIN) 300 MG capsule Take 1 capsule (300 mg total) by mouth 2 (two) times daily. 04/24/22   Novella Olive, FNP  glucose blood test strip Use up to 4 times daily as directed 09/07/22   Merrilee Jansky, MD  metFORMIN (GLUCOPHAGE) 1000 MG tablet Take 1 tablet (1,000 mg total) by mouth 2 (two) times daily. 09/07/22   Monifa Blanchette, Britta Mccreedy, MD  methocarbamol (ROBAXIN) 500 MG tablet Take 1 tablet (500 mg total) by mouth at bedtime as needed for muscle spasms. 09/07/22   LampteyBritta Mccreedy, MD  Multiple Vitamin (MULTIVITAMIN WITH MINERALS) TABS tablet Take 1 tablet by mouth daily. 08/17/21   de [place], Buren Kos, MD  NON FORMULARY Pt uses a cpap nightly    [provider]  potassium chloride SA (KLOR-CON M) 20 MEQ tablet Take 2 tablets (40 mEq total) by mouth daily. 01/26/22   Bensimhon,  Bevelyn Buckles, MD  rosuvastatin (CRESTOR) 20 MG tablet Take 1 tablet (20 mg total) by mouth daily. 09/07/22   Ikran Patman, Britta Mccreedy, MD  sacubitril-valsartan (ENTRESTO) 97-103 MG Take 1 tablet by mouth 2 (two) times daily. 02/28/22   Andrey Farmer, PA-C  Vitamin D, Ergocalciferol, (DRISDOL) 1.25 MG (50000 UNIT) CAPS capsule Take 1 capsule (50,000 Units total) by mouth once a week. 08/17/21   de [place], Raymond J, MD    Family History Family History  Problem Relation Age of Onset   Seizures Brother  Heart failure Neg Hx     Social History Social History   Tobacco Use   Smoking status: Former    Packs/day: 0.25    Years: 32.00    Additional pack years: 0.00    Total pack years: 8.00    Types: Cigarettes    Quit date: 04/2021    Years since quitting: 1.4   Smokeless tobacco: Never  Vaping Use   Vaping Use: Never used  Substance Use Topics   Alcohol use: Yes    Comment: occasional   Drug use: Yes    Types: Marijuana    Comment: routinely     Allergies   Lisinopril   Review of Systems Review of Systems As per HPI  Physical Exam Triage Vital Signs ED Triage Vitals  Enc Vitals Group     BP 09/07/22 0921 (!) 196/129     Pulse Rate 09/07/22 0921 84     Resp 09/07/22 0921 18     Temp 09/07/22 0921 97.8 F (36.6 C)     Temp Source 09/07/22 0921 Oral     SpO2 09/07/22 0921 94 %     Weight --      Height --      Head Circumference --      Peak Flow --      Pain Score 09/07/22 0916 10     Pain Loc --      Pain Edu? --      Excl. in GC? --    No data found.  Updated Vital Signs BP (!) 196/129 (BP Location: Right Arm)   Pulse 84   Temp 97.8 F (36.6 C) (Oral)   Resp 18   SpO2 94%   Visual Acuity Right Eye Distance:   Left Eye Distance:   Bilateral Distance:    Right Eye Near:   Left Eye Near:    Bilateral Near:     Physical Exam Vitals and nursing note reviewed.  Constitutional:      General: He is not in acute distress.    Appearance: He is not  ill-appearing.  Cardiovascular:     Rate and Rhythm: Normal rate and regular rhythm.     Pulses: Normal pulses.     Heart sounds: Normal heart sounds.  Pulmonary:     Effort: Pulmonary effort is normal.     Breath sounds: Normal breath sounds.  Musculoskeletal:        General: Normal range of motion.     Comments: Tenderness on palpation of the paraspinal muscles in the lumbosacral region.  Deep tendon reflexes are 2+.  Power is 5/5 in both lower extremities.  No sensory deficits.  Neurological:     Mental Status: He is alert.      UC Treatments / Results  Labs (all labs ordered are listed, but only abnormal results are displayed) Labs Reviewed  COMPREHENSIVE METABOLIC PANEL - Abnormal; Notable for the following components:      Result Value   Potassium 3.4 (*)    Glucose, Bld 202 (*)    Albumin 3.4 (*)    AST 14 (*)    All other components within normal limits  CBC WITH DIFFERENTIAL/PLATELET  HEMOGLOBIN A1C    EKG   Radiology No results found.  Procedures Procedures (including critical care time)  Medications Ordered in UC Medications - No data to display  Initial Impression / Assessment and Plan / UC Course  I have reviewed the triage vital signs and the nursing notes.  Pertinent labs & imaging results that were available during my care of the patient were reviewed by me and considered in my medical decision making (see chart for details).     1.  Acute bilateral low back pain with sciatica: Naproxen 375 mg twice daily Robaxin 500 mg at bedtime-medication precautions given Heating pad use only 20 minutes on-20 minutes off cycle recommended Gentle range of motion exercises recommended Return precautions given No indication for imaging at this time  2.  Hypertensive urgency: Amlodipine, carvedilol have been refilled Medication compliance have been reemphasized Patient is advised to decrease salt intake to less than 3 g a day Weight loss will help with  blood pressure control Increase physical activity to more than 150 minutes a week.  3.  Diabetes mellitus type 2: Medication records from a unique source has been reviewed Hemoglobin A1c was 7.0 about 8 months ago Metformin and Marcelline Deist have been refilled Return precautions given. Final Clinical Impressions(s) / UC Diagnoses   Final diagnoses:  Acute bilateral low back pain with sciatica, sciatica laterality unspecified  Asymptomatic hypertensive urgency     Discharge Instructions      Please take medications as prescribed Gentle stretching exercises Use heating pad only 20 minutes on-20 minutes off cycle Moderate exercise for at least 4 to 6 weeks recommended Weight loss would help with symptoms Medications have been sent prescription pharmacy for pickup Please do not drive or operate machinery after taking Robaxin  because Robaxin will make you drowsy If you develop any chest pain, severe lower back pain, severe headache or severe abdominal pain Please go to the emergency department to be evaluated.   ED Prescriptions     Medication Sig Dispense Auth. Provider   amLODipine (NORVASC) 10 MG tablet Take 1 tablet (10 mg total) by mouth daily. 90 tablet Taedyn Glasscock, Britta Mccreedy, MD   ARIPiprazole (ABILIFY) 5 MG tablet Take 1 tablet (5 mg total) by mouth daily. 90 tablet Domenick Quebedeaux, Britta Mccreedy, MD   aspirin 81 MG chewable tablet Chew 1 tablet (81 mg total) by mouth daily. 90 tablet Katrinna Travieso, Britta Mccreedy, MD   blood glucose meter kit and supplies KIT  (Status: Discontinued) Dispense based on patient and insurance preference. Use up to four times daily as directed. (FOR ICD-9 250.00, 250.01). 1 each Mehlani Blankenburg, Britta Mccreedy, MD   carvedilol (COREG) 25 MG tablet Take 1 tablet (25 mg total) by mouth 2 (two) times daily with a meal. 180 tablet Mcgregor Tinnon, Britta Mccreedy, MD   dapagliflozin propanediol (FARXIGA) 10 MG TABS tablet Take 1 tablet (10 mg total) by mouth daily. 30 tablet Kristol Almanzar, Britta Mccreedy, MD   eplerenone  (INSPRA) 25 MG tablet Take 1 tablet (25 mg total) by mouth daily. 30 tablet Tynasia Mccaul, Britta Mccreedy, MD   metFORMIN (GLUCOPHAGE) 1000 MG tablet Take 1 tablet (1,000 mg total) by mouth 2 (two) times daily. 180 tablet Loretha Ure, Britta Mccreedy, MD   methocarbamol (ROBAXIN) 500 MG tablet Take 1 tablet (500 mg total) by mouth at bedtime as needed for muscle spasms. 30 tablet Maddock Finigan, Britta Mccreedy, MD   rosuvastatin (CRESTOR) 20 MG tablet Take 1 tablet (20 mg total) by mouth daily. 90 tablet Venessa Wickham, Britta Mccreedy, MD   blood glucose meter kit and supplies KIT Use up to four times daily as directed. 1 each Esha Fincher, Britta Mccreedy, MD   naproxen (NAPROSYN) 375 MG tablet Take 1 tablet (375 mg total) by mouth 2 (two) times daily. 30 tablet Joella Saefong, Britta Mccreedy, MD  PDMP not reviewed this encounter.   Merrilee Jansky, MD 09/07/22 [phone]
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
## 2022-09-07 NOTE — Discharge Instructions (Addendum)
Please take medications as prescribed Gentle stretching exercises Use heating pad only 20 minutes on-20 minutes off cycle Moderate exercise for at least 4 to 6 weeks recommended Weight loss would help with symptoms Medications have been sent prescription pharmacy for pickup Please do not drive or operate machinery after taking Robaxin  because Robaxin will make you drowsy If you develop any chest pain, severe lower back pain, severe headache or severe abdominal pain Please go to the emergency department to be evaluated.

## 2022-09-07 NOTE — Telephone Encounter (Signed)
Patient called needing to schedule an appointment with Dr. Alvester Morin for his back. The number to contact patient is [phone]

## 2022-09-08 LAB — HEMOGLOBIN A1C
Hgb A1c MFr Bld: 9.7 % — ABNORMAL HIGH (ref 4.8–5.6)
Mean Plasma Glucose: 232 mg/dL

## 2022-09-08 NOTE — Telephone Encounter (Signed)
Attempted to call patient to schedule injection, mailbox full

## 2022-09-11 ENCOUNTER — Other Ambulatory Visit (HOSPITAL_COMMUNITY): Payer: Self-pay

## 2022-09-18 ENCOUNTER — Other Ambulatory Visit (HOSPITAL_COMMUNITY): Payer: Self-pay

## 2022-09-18 ENCOUNTER — Other Ambulatory Visit (HOSPITAL_BASED_OUTPATIENT_CLINIC_OR_DEPARTMENT_OTHER): Payer: Self-pay

## 2022-09-19 ENCOUNTER — Other Ambulatory Visit (HOSPITAL_BASED_OUTPATIENT_CLINIC_OR_DEPARTMENT_OTHER): Payer: Self-pay

## 2022-09-19 ENCOUNTER — Other Ambulatory Visit (HOSPITAL_COMMUNITY): Payer: Self-pay

## 2022-09-21 ENCOUNTER — Telehealth: Payer: Self-pay | Admitting: Physical Medicine and Rehabilitation

## 2022-09-21 NOTE — Telephone Encounter (Signed)
Patient called needing to schedule an appointment with Dr. Alvester Morin for his back. The number to contact patient is [phone]   Please leave the appointment time with patient's wife  (PT  request)

## 2022-09-22 ENCOUNTER — Telehealth: Payer: Self-pay | Admitting: Physical Medicine and Rehabilitation

## 2022-09-22 NOTE — Telephone Encounter (Signed)
The following mychart was sent to patient 07/11 will call to follow up on this.     09/21/22 10:05 AM Good morning,   We received a referral from your primary care physician for an injection. Unfortunately, Dr. Alvester Morin is the only one that can give you an injection in the back, so the appointment for [place] has been cancelled. I can get you scheduled for the injection. His first available is 10/02/22. Does that work for you?  This MyChart message has not been read.

## 2022-09-22 NOTE — Telephone Encounter (Signed)
Duplicate. See other note for additional documentation.

## 2022-09-22 NOTE — Telephone Encounter (Signed)
Tried calling back to discuss. No answer. Asked for patient to return call.

## 2022-09-22 NOTE — Telephone Encounter (Signed)
Patient called asked for a call back as soon as possible. Patient advised he is in a great deal of pain. The number to contact patient is [phone]

## 2022-09-26 ENCOUNTER — Telehealth: Payer: Self-pay | Admitting: Physical Medicine and Rehabilitation

## 2022-09-26 NOTE — Telephone Encounter (Signed)
IC and talked with patient. Scheduled from closed referral. Per notes in referral auth still good.

## 2022-09-26 NOTE — Telephone Encounter (Signed)
Please call Shanda Bumps (patient's spouse) to schedule the appt with Dr Alvester Morin.      Shanda Bumps call back number [phone]

## 2022-10-02 ENCOUNTER — Ambulatory Visit: Payer: Commercial Managed Care - HMO | Admitting: Physician Assistant

## 2022-10-06 LAB — HM DIABETES EYE EXAM

## 2022-10-08 ENCOUNTER — Other Ambulatory Visit (HOSPITAL_COMMUNITY): Payer: Self-pay

## 2022-10-09 ENCOUNTER — Encounter (HOSPITAL_COMMUNITY): Payer: Self-pay | Admitting: Emergency Medicine

## 2022-10-09 ENCOUNTER — Ambulatory Visit (HOSPITAL_COMMUNITY)
Admission: EM | Admit: 2022-10-09 | Discharge: 2022-10-09 | Disposition: A | Discharge status: Home / Self Care | Payer: Medicaid Other | Attending: Emergency Medicine | Admitting: Emergency Medicine

## 2022-10-09 ENCOUNTER — Other Ambulatory Visit: Payer: Self-pay

## 2022-10-09 ENCOUNTER — Other Ambulatory Visit (HOSPITAL_COMMUNITY): Payer: Self-pay

## 2022-10-09 DIAGNOSIS — I110 Hypertensive heart disease with heart failure: Secondary | ICD-10-CM | POA: Insufficient documentation

## 2022-10-09 DIAGNOSIS — R509 Fever, unspecified: Secondary | ICD-10-CM | POA: Diagnosis present

## 2022-10-09 DIAGNOSIS — G4730 Sleep apnea, unspecified: Secondary | ICD-10-CM | POA: Diagnosis not present

## 2022-10-09 DIAGNOSIS — Z7984 Long term (current) use of oral hypoglycemic drugs: Secondary | ICD-10-CM | POA: Diagnosis not present

## 2022-10-09 DIAGNOSIS — Z6841 Body Mass Index (BMI) 40.0 and over, adult: Secondary | ICD-10-CM | POA: Diagnosis not present

## 2022-10-09 DIAGNOSIS — I509 Heart failure, unspecified: Secondary | ICD-10-CM | POA: Diagnosis not present

## 2022-10-09 DIAGNOSIS — Z1152 Encounter for screening for COVID-19: Secondary | ICD-10-CM | POA: Insufficient documentation

## 2022-10-09 DIAGNOSIS — J069 Acute upper respiratory infection, unspecified: Secondary | ICD-10-CM | POA: Insufficient documentation

## 2022-10-09 DIAGNOSIS — E1142 Type 2 diabetes mellitus with diabetic polyneuropathy: Secondary | ICD-10-CM | POA: Diagnosis not present

## 2022-10-09 DIAGNOSIS — J45909 Unspecified asthma, uncomplicated: Secondary | ICD-10-CM | POA: Insufficient documentation

## 2022-10-09 MED ORDER — ALBUTEROL SULFATE HFA 108 (90 BASE) MCG/ACT IN AERS
2.0000 | INHALATION_SPRAY | RESPIRATORY_TRACT | 0 refills | Status: AC | PRN
  Filled 2022-10-09: qty 18, 17d supply, fill #0

## 2022-10-09 NOTE — ED Provider Notes (Signed)
[facility]    CSN: [id number] Arrival date & time: 10/09/22  0841      History   Chief Complaint Chief Complaint  Patient presents with   Cough    HPI Jordan Sparks is a 47 y.o. male.   Patient presents for evaluation of subjective fever, chills, body aches, nasal congestion, rhinorrhea, right-sided ear pain, sore throat, cough, shortness of breath, wheezing, headaches, diarrhea and vomiting present for 3 days.  Cough is nonproductive.  Shortness of breath and wheezing experienced overnight.  History of sleep apnea, endorses exacerbated, difficulty wearing CPAP.  Last occurrence of vomiting this morning, decreased appetite, difficulty tolerating food and fluids.  Has attempted use of DayQuil which has been somewhat helpful.  History of asthma, CHF, diabetes, obesity, sleep apnea.  Past Medical History:  Diagnosis Date   Anxiety    Asthma    CHF (congestive heart failure) (HCC)    Coronary artery disease    Depression    Diabetes mellitus type 2 in obese    Hypertension    Manic disorder (HCC)    Obesity    Sleep apnea     Patient Active Problem List   Diagnosis Date Noted   Trigger thumb, right thumb 04/18/2022   Bipolar 1 disorder, mixed, moderate (HCC) 12/06/2021   Insomnia 12/06/2021   Encounter for long-term (current) use of medications 12/06/2021   Unilateral primary osteoarthritis, right hip 11/30/2021   Carpal tunnel syndrome, right upper limb 11/30/2021   Low back pain 10/11/2021   Peripheral neuropathy 10/11/2021   Pain in left shoulder 08/31/2021   Spinal stenosis of cervical region 06/09/2021   Nonischemic cardiomyopathy (HCC) 06/06/2021   Chest pain 06/01/2021   Chest pain of uncertain etiology 05/31/2021   Obesity, Class III, BMI 40-49.9 (morbid obesity) (HCC) 05/31/2021   Diabetes mellitus (HCC) 05/04/2021   Sleep apnea 05/04/2021   Hypertensive urgency 10/19/2020   GERD (gastroesophageal reflux disease) 02/09/2018    Hyperglycemia 02/08/2018   Atypical chest pain 02/08/2018   Hypokalemia 02/08/2018   Leukocytosis 02/08/2018   Nausea and vomiting 02/08/2018   Mood disorder (HCC)    Hypertension    Anxiety    Hyperglycemia without ketosis    Manic disorder (HCC)     Past Surgical History:  Procedure Laterality Date   CARPAL TUNNEL RELEASE Right 12/19/2021   Procedure: RIGHT CARPAL TUNNEL RELEASE;  Surgeon: Eldred Manges, MD;  Location: [facility];  Service: Orthopedics;  Laterality: Right;   Dental procedure         Home Medications    Prior to Admission medications   Medication Sig Start Date End Date Taking? Authorizing Provider  Accu-Chek Softclix Lancets lancets Use up to 4 times daily as directed 09/07/22   Merrilee Jansky, MD  amLODipine (NORVASC) 10 MG tablet Take 1 tablet (10 mg total) by mouth daily. 09/07/22   Lamptey, Britta Mccreedy, MD  ARIPiprazole (ABILIFY) 5 MG tablet Take 1 tablet (5 mg total) by mouth daily. 09/07/22   Merrilee Jansky, MD  aspirin 81 MG chewable tablet Chew 1 tablet (81 mg total) by mouth daily. Patient not taking: Reported on 10/09/2022 09/07/22   Merrilee Jansky, MD  blood glucose meter kit and supplies KIT Use up to four times daily as directed. 09/07/22   Merrilee Jansky, MD  buPROPion (WELLBUTRIN XL) 150 MG 24 hr tablet Take 1 tablet (150 mg total) by mouth every morning. 06/06/22 06/06/23  Stasia Cavalier, MD  carvedilol (COREG) 25 MG tablet Take 1 tablet (25 mg total) by mouth 2 (two) times daily. 04/24/22 07/23/22  Marjie Skiff E, PA-C  carvedilol (COREG) 25 MG tablet Take 1 tablet (25 mg total) by mouth 2 (two) times daily with a meal. 09/07/22 12/06/22  Lamptey, Britta Mccreedy, MD  cetirizine (ZYRTEC) 10 MG tablet Take 1 tablet (10 mg total) by mouth daily. Patient not taking: Reported on 10/09/2022 08/08/22   [place], Buren Kos, MD  dapagliflozin propanediol (FARXIGA) 10 MG TABS tablet Take 1 tablet (10 mg total) by mouth daily. 09/07/22   Merrilee Jansky, MD  divalproex (DEPAKOTE) 250 MG DR tablet Take 1 tablet (250 mg total) by mouth 2 (two) times daily. 06/06/22   Stasia Cavalier, MD  DULoxetine (CYMBALTA) 60 MG capsule Take 1 capsule (60 mg total) by mouth 2 (two) times daily. 06/06/22   Stasia Cavalier, MD  eplerenone (INSPRA) 25 MG tablet Take 1 tablet (25 mg total) by mouth daily. 09/07/22 12/06/22  Merrilee Jansky, MD  fluticasone (FLONASE) 50 MCG/ACT nasal spray Place 2 sprays into both nostrils daily as needed for allergies. 02/06/22   [place], Buren Kos, MD  furosemide (LASIX) 20 MG tablet Take 1 tablet (20 mg total) by mouth 2 (two) times daily. 03/15/22   [place], Raymond J, MD  gabapentin (NEURONTIN) 300 MG capsule Take 1 capsule (300 mg total) by mouth 2 (two) times daily. 04/24/22   Novella Olive, FNP  glucose blood test strip Use up to 4 times daily as directed 09/07/22   Merrilee Jansky, MD  metFORMIN (GLUCOPHAGE) 1000 MG tablet Take 1 tablet (1,000 mg total) by mouth 2 (two) times daily. 09/07/22   Lamptey, Britta Mccreedy, MD  methocarbamol (ROBAXIN) 500 MG tablet Take 1 tablet (500 mg total) by mouth at bedtime as needed for muscle spasms. 09/07/22   LampteyBritta Mccreedy, MD  Multiple Vitamin (MULTIVITAMIN WITH MINERALS) TABS tablet Take 1 tablet by mouth daily. 08/17/21   [place], Buren Kos, MD  naproxen (NAPROSYN) 375 MG tablet Take 1 tablet (375 mg total) by mouth 2 (two) times daily. 09/07/22   Lamptey, Britta Mccreedy, MD  NON FORMULARY Pt uses a cpap nightly    [provider]  potassium chloride SA (KLOR-CON M) 20 MEQ tablet Take 2 tablets (40 mEq total) by mouth daily. 01/26/22   Bensimhon, Bevelyn Buckles, MD  rosuvastatin (CRESTOR) 20 MG tablet Take 1 tablet (20 mg total) by mouth daily. 09/07/22   Lamptey, Britta Mccreedy, MD  sacubitril-valsartan (ENTRESTO) 97-103 MG Take 1 tablet by mouth 2 (two) times daily. 02/28/22   Andrey Farmer, PA-C  Vitamin D, Ergocalciferol, (DRISDOL) 1.25 MG (50000 UNIT) CAPS capsule Take 1 capsule  (50,000 Units total) by mouth once a week. 08/17/21   de [place], Raymond J, MD    Family History Family History  Problem Relation Age of Onset   Seizures Brother    Heart failure Neg Hx     Social History Social History   Tobacco Use   Smoking status: Former    Current packs/day: 0.00    Average packs/day: 0.3 packs/day for 32.0 years (8.0 ttl pk-yrs)    Types: Cigarettes    Start date: 04/1989    Quit date: 04/2021    Years since quitting: 1.4   Smokeless tobacco: Never  Vaping Use   Vaping status: Never Used  Substance Use Topics   Alcohol use: Yes    Comment:  occasional   Drug use: Yes    Types: Marijuana    Comment: routinely     Allergies   Lisinopril   Review of Systems Review of Systems  Constitutional:  Positive for chills and fever. Negative for activity change, appetite change, diaphoresis, fatigue and unexpected weight change.  HENT:  Positive for congestion, ear pain, rhinorrhea and sore throat. Negative for dental problem, drooling, ear discharge, facial swelling, hearing loss, mouth sores, nosebleeds, postnasal drip, sinus pressure, sinus pain, sneezing, tinnitus, trouble swallowing and voice change.   Respiratory:  Positive for cough, shortness of breath and wheezing. Negative for apnea, choking, chest tightness and stridor.   Cardiovascular: Negative.   Gastrointestinal:  Positive for diarrhea and vomiting. Negative for abdominal distention, abdominal pain, anal bleeding, blood in stool, constipation, nausea and rectal pain.  Musculoskeletal:  Positive for myalgias. Negative for arthralgias, back pain, gait problem, joint swelling, neck pain and neck stiffness.  Skin: Negative.   Neurological:  Positive for headaches. Negative for dizziness, tremors, seizures, syncope, facial asymmetry, speech difficulty, weakness, light-headedness and numbness.     Physical Exam Triage Vital Signs ED Triage Vitals  Encounter Vitals Group     BP 10/09/22 0856 (!)  173/112     Systolic BP Percentile --      Diastolic BP Percentile --      Pulse Rate 10/09/22 0856 99     Resp 10/09/22 0856 (!) 22     Temp 10/09/22 0856 98.8 F (37.1 C)     Temp Source 10/09/22 0856 Oral     SpO2 10/09/22 0856 96 %     Weight --      Height --      Head Circumference --      Peak Flow --      Pain Score 10/09/22 0853 0     Pain Loc --      Pain Education --      Exclude from Growth Chart --    No data found.  Updated Vital Signs BP (!) 138/92 (BP Location: Left Arm) Comment: reporsitioned, less coughing, calmer--large cuff  Pulse 99   Temp 98.8 F (37.1 C) (Oral)   Resp (!) 22 Comment: coughing frequently  SpO2 96%   Visual Acuity Right Eye Distance:   Left Eye Distance:   Bilateral Distance:    Right Eye Near:   Left Eye Near:    Bilateral Near:     Physical Exam Constitutional:      Appearance: Normal appearance.  HENT:     Head: Normocephalic.     Right Ear: Tympanic membrane, ear canal and external ear normal.     Left Ear: Tympanic membrane, ear canal and external ear normal.     Nose: Congestion and rhinorrhea present.     Mouth/Throat:     Mouth: Mucous membranes are moist.     Pharynx: Posterior oropharyngeal erythema present.  Eyes:     Extraocular Movements: Extraocular movements intact.  Cardiovascular:     Rate and Rhythm: Normal rate and regular rhythm.     Pulses: Normal pulses.     Heart sounds: Normal heart sounds.  Pulmonary:     Effort: Pulmonary effort is normal.     Breath sounds: Normal breath sounds.  Musculoskeletal:        General: Normal range of motion.  Skin:    General: Skin is warm and dry.  Neurological:     General: No focal deficit present.  Mental Status: He is alert and oriented to person, place, and time. Mental status is at baseline.      UC Treatments / Results  Labs (all labs ordered are listed, but only abnormal results are displayed) Labs Reviewed - No data to  display  EKG   Radiology No results found.  Procedures Procedures (including critical care time)  Medications Ordered in UC Medications - No data to display  Initial Impression / Assessment and Plan / UC Course  I have reviewed the triage vital signs and the nursing notes.  Pertinent labs & imaging results that were available during my care of the patient were reviewed by me and considered in my medical decision making (see chart for details).  Viral URI with cough  Patient is in no signs of distress nor toxic appearing.  Vital signs are stable.  Low suspicion for pneumonia, pneumothorax or bronchitis and therefore will defer imaging.COVID test is pending, reviewed quarantine guidelines per CDC recommendations, qualifies for antivirals, will prescribe Paxlovid GFR greater than 60 in June 2024 discussed administration, declined prescription medication.May use additional over-the-counter medications as needed for supportive care.  May follow-up with urgent care as needed if symptoms persist or worsen.  Note given.   Final Clinical Impressions(s) / UC Diagnoses   Final diagnoses:  None   Discharge Instructions   None    ED Prescriptions   None    PDMP not reviewed this encounter.   Valinda Hoar, [place] 10/09/22 [phone]

## 2022-10-09 NOTE — Discharge Instructions (Addendum)
Your symptoms today are most likely being caused by a virus and should steadily improve in time it can take up to 7 to 10 days before you truly start to see a turnaround however things will get better  Covid test is pending up to 24 hours, you will be notified of positive test results only, if positive you will qualify for antiviral treatment, this will help to minimize symptoms but does not fully take away illness, if positive you will need to quarantine if having fever, if no fever you may continue activity wearing mask until symptoms have resolved  You may use albuterol inhaler taking 2 puffs every 4-6 hours as needed for wheezing and shortness of breath    You can take Tylenol and/or Ibuprofen as needed for fever reduction and pain relief.   For cough: honey 1/2 to 1 teaspoon (you can dilute the honey in water or another fluid).  You can also use guaifenesin and dextromethorphan for cough. You can use a humidifier for chest congestion and cough.  If you don't have a humidifier, you can sit in the bathroom with the hot shower running.      For sore throat: try warm salt water gargles, cepacol lozenges, throat spray, warm tea or water with lemon/honey, popsicles or ice, or OTC cold relief medicine for throat discomfort.   For congestion: take a daily anti-histamine like Zyrtec, Claritin, and a oral decongestant, such as pseudoephedrine.  You can also use Flonase 1-2 sprays in each nostril daily.   It is important to stay hydrated: drink plenty of fluids (water, gatorade/powerade/pedialyte, juices, or teas) to keep your throat moisturized and help further relieve irritation/discomfort.

## 2022-10-09 NOTE — ED Triage Notes (Signed)
Onset Friday of symptoms.  Symptoms worsened yesterday.  Patient has cough, runny nose, nausea, vomiting.  Having hot spells.  Patient has vomited 4 times today.  Complains of aches and pains.   Wife came home with similar symptoms on Friday.   Friend of patient, reports his wife has covid.  Reports 2 co-workers have covid.    Has ahistory of pneumonia

## 2022-10-11 ENCOUNTER — Other Ambulatory Visit (HOSPITAL_COMMUNITY): Payer: Self-pay

## 2022-10-11 ENCOUNTER — Other Ambulatory Visit: Payer: Self-pay

## 2022-10-11 ENCOUNTER — Ambulatory Visit (INDEPENDENT_AMBULATORY_CARE_PROVIDER_SITE_OTHER): Payer: Medicaid Other | Admitting: Physical Medicine and Rehabilitation

## 2022-10-11 VITALS — BP 175/115 | HR 72

## 2022-10-11 DIAGNOSIS — M5416 Radiculopathy, lumbar region: Secondary | ICD-10-CM | POA: Diagnosis not present

## 2022-10-11 MED ORDER — METHYLPREDNISOLONE ACETATE 80 MG/ML IJ SUSP
80.0000 mg | Freq: Once | INTRAMUSCULAR | Status: AC
Start: 2022-10-11 — End: 2022-10-11
  Administered 2022-10-11: 80 mg

## 2022-10-11 NOTE — Procedures (Signed)
Lumbar Epidural Steroid Injection - Interlaminar Approach with Fluoroscopic Guidance  Patient: Jordan Sparks      Date of Birth: Sep 30, 1975 MRN: [id number] PCP: Corine Shelter, MD      Visit Date: 10/11/2022   Universal Protocol:     Consent Given By: the patient  Position: PRONE  Additional Comments: Vital signs were monitored before and after the procedure. Patient was prepped and draped in the usual sterile fashion. The correct patient, procedure, and site was verified.   Injection Procedure Details:   Procedure diagnoses: Lumbar radiculopathy [M54.16]   Meds Administered:  Meds ordered this encounter  Medications   methylPREDNISolone acetate (DEPO-MEDROL) injection 80 mg     Laterality: Right  Location/Site:  L5-S1  Needle: 4.5 in., 20 ga. Tuohy  Needle Placement: Paramedian epidural  Findings:   -Comments: Excellent flow of contrast into the epidural space.  Procedure Details: Using a paramedian approach from the side mentioned above, the region overlying the inferior lamina was localized under fluoroscopic visualization and the soft tissues overlying this structure were infiltrated with 4 ml. of 1% Lidocaine without Epinephrine. The Tuohy needle was inserted into the epidural space using a paramedian approach.   The epidural space was localized using loss of resistance along with counter oblique bi-planar fluoroscopic views.  After negative aspirate for air, blood, and CSF, a 2 ml. volume of Isovue-250 was injected into the epidural space and the flow of contrast was observed. Radiographs were obtained for documentation purposes.    The injectate was administered into the level noted above.   Additional Comments:  The patient tolerated the procedure well Dressing: 2 x 2 sterile gauze and Band-Aid    Post-procedure details: Patient was observed during the procedure. Post-procedure instructions were reviewed.  Patient left the clinic in  stable condition.

## 2022-10-11 NOTE — Progress Notes (Signed)
Kris Teasdale Shabazz-McGregor - 47 y.o. male MRN [id number]  Date of birth: 15-Sep-1975  Office Visit Note: Visit Date: 10/11/2022 PCP: Corine Shelter, MD Referred by: Corine Shelter, MD  Subjective: Chief Complaint  Patient presents with   Lower Back - Pain   HPI:  Wille Glaser Shabazz-McGregor is a 47 y.o. male who comes in today at the request of [place] Persons, PA-C for planned Right L5-S1 Lumbar Interlaminar epidural steroid injection with fluoroscopic guidance.  The patient has failed conservative care including home exercise, medications, time and activity modification.  This injection will be diagnostic and hopefully therapeutic.  Please see requesting physician notes for further details and justification.  He reports his right hip and leg is really worse than it has been in the past and he feels like it is different but at least clinically is in the same area that would be consistent more with an L5 or L4 radicular pattern which is what he had in the past.  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  I have asked him to see how that shot does over the next week to week and a half and he can call us anytime if it worsens but if he gets a lot better that I think that is the treatment and his previous appointment that is feeling worse and there is probably not anything new going on.  If he does not get relief then we would have to do full workup on what is going on with this more anterior lateral hip and thigh pain.  No red flag complaints.   ROS Otherwise per HPI.  Assessment & Plan: Visit Diagnoses:    ICD-10-CM   1. Lumbar radiculopathy  M54.16 XR C-ARM NO REPORT    Epidural Steroid injection    methylPREDNISolone acetate (DEPO-MEDROL) injection 80 mg      Plan: No additional findings.   Meds & Orders:  Meds ordered this encounter  Medications   methylPREDNISolone acetate (DEPO-MEDROL) injection 80 mg    Orders Placed This Encounter   Procedures   XR C-ARM NO REPORT   Epidural Steroid injection    Follow-up: Return if symptoms worsen or fail to improve.   Procedures: No procedures performed  Lumbar Epidural Steroid Injection - Interlaminar Approach with Fluoroscopic Guidance  Patient: Ollie Zamorski Shabazz-McGregor      Date of Birth: 01/29/1976 MRN: [id number] PCP: Corine Shelter, MD      Visit Date: 10/11/2022   Universal Protocol:     Consent Given By: the patient  Position: PRONE  Additional Comments: Vital signs were monitored before and after the procedure. Patient was prepped and draped in the usual sterile fashion. The correct patient, procedure, and site was verified.   Injection Procedure Details:   Procedure diagnoses: Lumbar radiculopathy [M54.16]   Meds Administered:  Meds ordered this encounter  Medications   methylPREDNISolone acetate (DEPO-MEDROL) injection 80 mg     Laterality: Right  Location/Site:  L5-S1  Needle: 4.5 in., 20 ga. Tuohy  Needle Placement: Paramedian epidural  Findings:   -Comments: Excellent flow of contrast into the epidural space.  Procedure Details: Using a paramedian approach from the side mentioned above, the region overlying the inferior lamina was localized under fluoroscopic visualization and the soft tissues overlying this structure were infiltrated with 4 ml. of 1% Lidocaine without Epinephrine. The Tuohy needle was inserted into the epidural space using a paramedian approach.   The epidural space was localized using loss  of resistance along with counter oblique bi-planar fluoroscopic views.  After negative aspirate for air, blood, and CSF, a 2 ml. volume of Isovue-250 was injected into the epidural space and the flow of contrast was observed. Radiographs were obtained for documentation purposes.    The injectate was administered into the level noted above.   Additional Comments:  The patient tolerated the procedure well Dressing: 2 x 2 sterile  gauze and Band-Aid    Post-procedure details: Patient was observed during the procedure. Post-procedure instructions were reviewed.  Patient left the clinic in stable condition.   Clinical History: MRI LUMBAR SPINE WITHOUT CONTRAST   TECHNIQUE: Multiplanar, multisequence MR imaging of the lumbar spine was performed. No intravenous contrast was administered.   COMPARISON:  Lumbar spine radiographs 04/14/2021. CTA chest, abdomen, and pelvis 06/18/2020.   FINDINGS: The study is motion degraded, moderately so on the axial sequences.   Segmentation: Standard.   Alignment: Trace retrolisthesis of L4 on L5. Straightening of the normal lumbar lordosis.   Vertebrae: No fracture, suspicious marrow lesion, or significant marrow edema. Mild chronic degenerative endplate changes at L5-S1.   Conus medullaris and cauda equina: Conus extends to the L1-2 level. Conus and cauda equina appear normal.   Paraspinal and other soft tissues: Unremarkable.   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   T12-L1 and L1-2: Negative.   L2-3: Minimal disc bulging without stenosis.   L3-4: Mild disc bulging without significant stenosis.   L4-5: Mild disc desiccation. Circumferential disc bulging and mild facet hypertrophy result in mild spinal stenosis, mild bilateral lateral recess stenosis, and mild-to-moderate bilateral neural foraminal stenosis.   L5-S1: Disc desiccation. Disc bulging and mild facet hypertrophy result in mild right and moderate left neural foraminal stenosis without spinal stenosis.   IMPRESSION: 1. Motion degraded examination. 2. 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     Electronically Signed   By: Sebastian Ache M.D.   On: 05/16/2021 18:00     Objective:  VS:  HT:     WT:   BMI:     BP:(!) 175/115  HR:72bpm  TEMP: ( )  RESP:  Physical Exam Vitals and nursing note reviewed.  Constitutional:      General: He is not in acute distress.    Appearance: Normal appearance. He is obese. He is not ill-appearing.  HENT:     Head: Normocephalic and atraumatic.     Right Ear: External ear normal.     Left Ear: External ear normal.     Nose: No congestion.  Eyes:     Extraocular Movements: Extraocular movements intact.  Cardiovascular:     Rate and Rhythm: Normal rate.     Pulses: Normal pulses.  Pulmonary:     Effort: Pulmonary effort is normal. No respiratory distress.  Abdominal:     General: There is no distension.     Palpations: Abdomen is soft.  Musculoskeletal:        General: No tenderness or signs of injury.     Cervical back: Neck supple.     Right lower leg: No edema.     Left lower leg: No edema.     Comments: Patient has good distal strength without clonus.  Skin:    Findings: No erythema or rash.  Neurological:     General: No  focal deficit present.     Mental Status: He is alert and oriented to person, place, and time.     Sensory: No sensory deficit.     Motor: No weakness or abnormal muscle tone.     Coordination: Coordination normal.  Psychiatric:        Mood and Affect: Mood normal.        Behavior: Behavior normal.      Imaging: No results found.

## 2022-10-11 NOTE — Patient Instructions (Signed)

## 2022-10-11 NOTE — Progress Notes (Signed)
Functional Pain Scale - descriptive words and definitions  Distressing (6)    Pain is present/unable to complete most ADLs limited by pain/sleep is difficult and active distraction is only marginal. Moderate range order  Average Pain 6   +Driver, -BT, -Dye Allergies.  Lower back pain on both sides, worse on the right side. Radiation in the right leg to hip and has tingling in the right leg

## 2022-10-19 ENCOUNTER — Other Ambulatory Visit (HOSPITAL_COMMUNITY): Payer: Self-pay

## 2022-10-23 ENCOUNTER — Other Ambulatory Visit (HOSPITAL_COMMUNITY): Payer: Self-pay

## 2022-11-07 ENCOUNTER — Other Ambulatory Visit (HOSPITAL_COMMUNITY): Payer: Self-pay

## 2022-11-07 ENCOUNTER — Other Ambulatory Visit: Payer: Self-pay

## 2022-11-17 ENCOUNTER — Other Ambulatory Visit (HOSPITAL_COMMUNITY): Payer: Self-pay

## 2022-11-20 ENCOUNTER — Other Ambulatory Visit (HOSPITAL_COMMUNITY): Payer: Self-pay

## 2022-11-25 ENCOUNTER — Other Ambulatory Visit (HOSPITAL_BASED_OUTPATIENT_CLINIC_OR_DEPARTMENT_OTHER): Payer: Self-pay

## 2022-11-25 ENCOUNTER — Encounter (HOSPITAL_COMMUNITY): Payer: Self-pay | Admitting: *Deleted

## 2022-11-25 ENCOUNTER — Ambulatory Visit (HOSPITAL_COMMUNITY)
Admission: EM | Admit: 2022-11-25 | Discharge: 2022-11-25 | Disposition: A | Discharge status: Home / Self Care | Payer: Medicaid Other | Attending: Emergency Medicine | Admitting: Emergency Medicine

## 2022-11-25 DIAGNOSIS — R0789 Other chest pain: Secondary | ICD-10-CM

## 2022-11-25 DIAGNOSIS — M62838 Other muscle spasm: Secondary | ICD-10-CM | POA: Diagnosis not present

## 2022-11-25 DIAGNOSIS — I10 Essential (primary) hypertension: Secondary | ICD-10-CM

## 2022-11-25 MED ORDER — DEXAMETHASONE SODIUM PHOSPHATE 10 MG/ML IJ SOLN
INTRAMUSCULAR | Status: AC
  Filled 2022-11-25: qty 1

## 2022-11-25 MED ORDER — CYCLOBENZAPRINE HCL 10 MG PO TABS
10.0000 mg | ORAL_TABLET | Freq: Two times a day (BID) | ORAL | 0 refills | Status: DC | PRN
  Filled 2022-11-25: qty 20, 10d supply, fill #0

## 2022-11-25 MED ORDER — DEXAMETHASONE SODIUM PHOSPHATE 10 MG/ML IJ SOLN
10.0000 mg | Freq: Once | INTRAMUSCULAR | Status: AC
  Administered 2022-11-25: 10 mg via INTRAMUSCULAR

## 2022-11-25 NOTE — ED Triage Notes (Addendum)
C/O right lateral neck stiffness "like a charlie horse" radiating down into right shoulder and RUE down to fingertips with tingling in all RUE digits onset 7-8 days ago. C/O painful ROM of RUE. Has been using massager without relief. Tried an old Rx of methocarbamol, which helped slightly. Has tried Aleve "Back & Muscle". Also c/o intermittent transient sharp substernal chest pains, lasting a few seconds at a time, onset appprox 7-8 days ago. Denies any chest pain at present. Pt reports concern bc he is having difficulty with insurance covering [place], so he's been out of it x approx 3 wks. States has appt with PCP on 9/19.

## 2022-11-25 NOTE — ED Provider Notes (Signed)
[facility]    CSN: [id number] Arrival date & time: 11/25/22  1004      History   Chief Complaint Chief Complaint  Patient presents with   Chest Pain   Neck Pain    HPI Jordan Sparks is a 47 y.o. male.   Patient presents to clinic for right-sided neck, scapular and shoulder stiffness, reports it feels 'like a charley horse.'  Pain and stiffness started at work the other night, after lifting heavy boxes.  He did try a muscle relaxer last night which helped some, tried Aleve and massage.  Denies any recent falls or trauma.  Denies any weakness. No dizziness or syncope. No chest pain or SOB currently.   Has been having intermittent left-sided chest pains on and off for the past week or so.  No current chest pain.  Insurance is not covering his anastrozole, has been out of this for about 3 weeks.  He does have a follow-up with his PCP upcoming in the next few days.   The history is provided by the patient and medical records.    Past Medical History:  Diagnosis Date   Anxiety    Asthma    CHF (congestive heart failure) (HCC)    Coronary artery disease    Depression    Diabetes mellitus type 2 in obese    Hypertension    Manic disorder (HCC)    Obesity    Sleep apnea     Patient Active Problem List   Diagnosis Date Noted   Trigger thumb, right thumb 04/18/2022   Bipolar 1 disorder, mixed, moderate (HCC) 12/06/2021   Insomnia 12/06/2021   Encounter for long-term (current) use of medications 12/06/2021   Unilateral primary osteoarthritis, right hip 11/30/2021   Carpal tunnel syndrome, right upper limb 11/30/2021   Low back pain 10/11/2021   Peripheral neuropathy 10/11/2021   Pain in left shoulder 08/31/2021   Spinal stenosis of cervical region 06/09/2021   Nonischemic cardiomyopathy (HCC) 06/06/2021   Chest pain 06/01/2021   Chest pain of uncertain etiology 05/31/2021   Obesity, Class III, BMI 40-49.9 (morbid obesity) (HCC) 05/31/2021    Diabetes mellitus (HCC) 05/04/2021   Sleep apnea 05/04/2021   Hypertensive urgency 10/19/2020   GERD (gastroesophageal reflux disease) 02/09/2018   Hyperglycemia 02/08/2018   Atypical chest pain 02/08/2018   Hypokalemia 02/08/2018   Leukocytosis 02/08/2018   Nausea and vomiting 02/08/2018   Mood disorder (HCC)    Hypertension    Anxiety    Hyperglycemia without ketosis    Manic disorder (HCC)     Past Surgical History:  Procedure Laterality Date   CARPAL TUNNEL RELEASE Right 12/19/2021   Procedure: RIGHT CARPAL TUNNEL RELEASE;  Surgeon: Eldred Manges, MD;  Location: [facility];  Service: Orthopedics;  Laterality: Right;   Dental procedure         Home Medications    Prior to Admission medications   Medication Sig Start Date End Date Taking? Authorizing Provider  amLODipine (NORVASC) 10 MG tablet Take 1 tablet (10 mg total) by mouth daily. 09/07/22  Yes Lamptey, Britta Mccreedy, MD  cyclobenzaprine (FLEXERIL) 10 MG tablet Take 1 tablet (10 mg total) by mouth 2 (two) times daily as needed for muscle spasms. 11/25/22  Yes Rinaldo Ratel, [place] N, FNP  sacubitril-valsartan (ENTRESTO) 97-103 MG Take 1 tablet by mouth 2 (two) times daily. 02/28/22  Yes Andrey Farmer, PA-C  Accu-Chek Softclix Lancets lancets Use up to 4 times daily as directed  09/07/22   Lamptey, Britta Mccreedy, MD  albuterol (VENTOLIN HFA) 108 (90 Base) MCG/ACT inhaler Inhale 2 puffs into the lungs every 4 (four) hours as needed for wheezing or shortness of breath. 10/09/22   White, Elita Boone, NP  ARIPiprazole (ABILIFY) 5 MG tablet Take 1 tablet (5 mg total) by mouth daily. 09/07/22   Merrilee Jansky, MD  aspirin 81 MG chewable tablet Chew 1 tablet (81 mg total) by mouth daily. Patient not taking: Reported on 10/09/2022 09/07/22   Merrilee Jansky, MD  blood glucose meter kit and supplies KIT Use up to four times daily as directed. 09/07/22   Merrilee Jansky, MD  buPROPion (WELLBUTRIN XL) 150 MG 24 hr tablet Take  1 tablet (150 mg total) by mouth every morning. 06/06/22 06/06/23  Stasia Cavalier, MD  carvedilol (COREG) 25 MG tablet Take 1 tablet (25 mg total) by mouth 2 (two) times daily. 04/24/22 07/23/22  Marjie Skiff E, PA-C  carvedilol (COREG) 25 MG tablet Take 1 tablet (25 mg total) by mouth 2 (two) times daily with a meal. 09/07/22 12/06/22  Lamptey, Britta Mccreedy, MD  cetirizine (ZYRTEC) 10 MG tablet Take 1 tablet (10 mg total) by mouth daily. Patient not taking: Reported on 10/09/2022 08/08/22   [place], Buren Kos, MD  dapagliflozin propanediol (FARXIGA) 10 MG TABS tablet Take 1 tablet (10 mg total) by mouth daily. 09/07/22   Merrilee Jansky, MD  divalproex (DEPAKOTE) 250 MG DR tablet Take 1 tablet (250 mg total) by mouth 2 (two) times daily. 06/06/22   Stasia Cavalier, MD  DULoxetine (CYMBALTA) 60 MG capsule Take 1 capsule (60 mg total) by mouth 2 (two) times daily. 06/06/22   Stasia Cavalier, MD  eplerenone (INSPRA) 25 MG tablet Take 1 tablet (25 mg total) by mouth daily. 09/07/22 12/06/22  Merrilee Jansky, MD  fluticasone (FLONASE) 50 MCG/ACT nasal spray Place 2 sprays into both nostrils daily as needed for allergies. 02/06/22   [place], Buren Kos, MD  furosemide (LASIX) 20 MG tablet Take 1 tablet (20 mg total) by mouth 2 (two) times daily. 03/15/22   de [place], Raymond J, MD  gabapentin (NEURONTIN) 300 MG capsule Take 1 capsule (300 mg total) by mouth 2 (two) times daily. 04/24/22   Novella Olive, FNP  glucose blood test strip Use up to 4 times daily as directed 09/07/22   Merrilee Jansky, MD  metFORMIN (GLUCOPHAGE) 1000 MG tablet Take 1 tablet (1,000 mg total) by mouth 2 (two) times daily. 09/07/22   Lamptey, Britta Mccreedy, MD  methocarbamol (ROBAXIN) 500 MG tablet Take 1 tablet (500 mg total) by mouth at bedtime as needed for muscle spasms. 09/07/22   LampteyBritta Mccreedy, MD  Multiple Vitamin (MULTIVITAMIN WITH MINERALS) TABS tablet Take 1 tablet by mouth daily. 08/17/21   [place], Buren Kos, MD  naproxen (NAPROSYN) 375  MG tablet Take 1 tablet (375 mg total) by mouth 2 (two) times daily. 09/07/22   Lamptey, Britta Mccreedy, MD  NON FORMULARY Pt uses a cpap nightly    [provider]  potassium chloride SA (KLOR-CON M) 20 MEQ tablet Take 2 tablets (40 mEq total) by mouth daily. 01/26/22   Bensimhon, Bevelyn Buckles, MD  rosuvastatin (CRESTOR) 20 MG tablet Take 1 tablet (20 mg total) by mouth daily. 09/07/22   Lamptey, Britta Mccreedy, MD  Vitamin D, Ergocalciferol, (DRISDOL) 1.25 MG (50000 UNIT) CAPS capsule Take 1 capsule (50,000 Units total) by mouth once a  week. 08/17/21   [place], Buren Kos, MD    Family History Family History  Problem Relation Age of Onset   Seizures Brother    Heart failure Neg Hx     Social History Social History   Tobacco Use   Smoking status: Former    Current packs/day: 0.00    Average packs/day: 0.3 packs/day for 32.0 years (8.0 ttl pk-yrs)    Types: Cigarettes    Start date: 04/1989    Quit date: 04/2021    Years since quitting: 1.6   Smokeless tobacco: Never  Vaping Use   Vaping status: Never Used  Substance Use Topics   Alcohol use: Not Currently    Comment: rarely   Drug use: Yes    Types: Marijuana    Comment: routinely     Allergies   Lisinopril   Review of Systems Review of Systems  Constitutional:  Negative for fever.  Respiratory:  Negative for cough and shortness of breath.   Cardiovascular:  Negative for chest pain.     Physical Exam Triage Vital Signs ED Triage Vitals  Encounter Vitals Group     BP      Systolic BP Percentile      Diastolic BP Percentile      Pulse      Resp      Temp      Temp src      SpO2      Weight      Height      Head Circumference      Peak Flow      Pain Score      Pain Loc      Pain Education      Exclude from Growth Chart    No data found.  Updated Vital Signs BP (!) 161/115   Pulse 96   Temp 97.7 F (36.5 C) (Oral)   Resp 16   SpO2 97%   Visual Acuity Right Eye Distance:   Left Eye Distance:    Bilateral Distance:    Right Eye Near:   Left Eye Near:    Bilateral Near:     Physical Exam Vitals and nursing note reviewed.  Constitutional:      Appearance: Normal appearance. He is well-developed.  HENT:     Head: Normocephalic and atraumatic.     Right Ear: External ear normal.     Left Ear: External ear normal.     Nose: Nose normal.     Mouth/Throat:     Mouth: Mucous membranes are moist.  Eyes:     Pupils: Pupils are equal, round, and reactive to light.  Cardiovascular:     Rate and Rhythm: Normal rate and regular rhythm.     Heart sounds: Normal heart sounds. No murmur heard. Pulmonary:     Breath sounds: Normal breath sounds. No decreased breath sounds.  Musculoskeletal:        General: Tenderness present. No swelling, deformity or signs of injury. Normal range of motion.     Right shoulder: No bony tenderness. Normal strength. Normal pulse.     Cervical back: Normal range of motion.  Skin:    General: Skin is warm and dry.  Neurological:     General: No focal deficit present.     Mental Status: He is alert and oriented to person, place, and time.  Psychiatric:        Mood and Affect: Mood normal.  Behavior: Behavior normal. Behavior is cooperative.      UC Treatments / Results  Labs (all labs ordered are listed, but only abnormal results are displayed) Labs Reviewed - No data to display  EKG   Radiology No results found.  Procedures Procedures (including critical care time)  Medications Ordered in UC Medications  dexamethasone (DECADRON) injection 10 mg (has no administration in time range)    Initial Impression / Assessment and Plan / UC Course  I have reviewed the triage vital signs and the nursing notes.  Pertinent labs & imaging results that were available during my care of the patient were reviewed by me and considered in my medical decision making (see chart for details).  Vitals and triage reviewed, patient is hemodynamically  stable.  Lungs are vesicular, heart with regular rate and rhythm.  No current chest pain, but intermittent pains over the past week.  EKG showing normal sinus rhythm without any ST elevation or ST depression.   Having musculoskeletal trapezius spasms after lifting a heavy box.  Area is tender to palpation.  Range of motion is intact.  Strength is 5 out of 5 in upper extremities, sensation intact, radial pulses 2+.  Atraumatic, imaging deferred at this time.  Will trial one-time steroid dose in clinic and muscle relaxers.  Patient is diabetic, reports CBGs in the 140s, any lower than this and he does not feel well.  Encouraged to follow-up with PCP.  Strict emergency precautions given for chest pain.  Plan of care, follow-up care and return precautions given, no questions at this time.     Final Clinical Impressions(s) / UC Diagnoses   Final diagnoses:  Atypical chest pain  Trapezius muscle spasm  Essential hypertension     Discharge Instructions      We have given you a steroid injection today in clinic, this will temporarily increase your blood sugars.  You can take the muscle relaxer as needed, do not combine this with the Robaxin.  This medication may cause drowsiness, do not drink or drive on it.  You can also do warm compresses, gentle stretching and warm showers to help loosen up your muscles.  Please follow-up with your primary care provider regarding your blood pressure control.  If you develop any sudden chest pain, left-sided weakness, jaw pain, shortness of breath, or any new concerning symptoms seek immediate care at the nearest emergency department.       ED Prescriptions     Medication Sig Dispense Auth. Provider   cyclobenzaprine (FLEXERIL) 10 MG tablet Take 1 tablet (10 mg total) by mouth 2 (two) times daily as needed for muscle spasms. 20 tablet Noemi Bellissimo, [place] N, [place]      PDMP not reviewed this encounter.   Maleeha Halls, [place] N, [place] 11/25/22 1104

## 2022-11-25 NOTE — Discharge Instructions (Addendum)
We have given you a steroid injection today in clinic, this will temporarily increase your blood sugars.  You can take the muscle relaxer as needed, do not combine this with the Robaxin.  This medication may cause drowsiness, do not drink or drive on it.  You can also do warm compresses, gentle stretching and warm showers to help loosen up your muscles.  Please follow-up with your primary care provider regarding your blood pressure control.  If you develop any sudden chest pain, left-sided weakness, jaw pain, shortness of breath, or any new concerning symptoms seek immediate care at the nearest emergency department.

## 2022-11-27 ENCOUNTER — Other Ambulatory Visit (HOSPITAL_COMMUNITY): Payer: Self-pay

## 2022-11-30 ENCOUNTER — Other Ambulatory Visit (HOSPITAL_BASED_OUTPATIENT_CLINIC_OR_DEPARTMENT_OTHER): Payer: Self-pay | Admitting: Family Medicine

## 2022-11-30 ENCOUNTER — Other Ambulatory Visit (HOSPITAL_COMMUNITY): Payer: Self-pay

## 2022-11-30 ENCOUNTER — Telehealth: Payer: Self-pay | Admitting: Physical Medicine and Rehabilitation

## 2022-11-30 MED ORDER — AMLODIPINE BESYLATE 10 MG PO TABS
10.0000 mg | ORAL_TABLET | Freq: Every day | ORAL | 2 refills | Status: DC
  Filled 2022-11-30: qty 90, 90d supply, fill #0
  Filled 2023-05-22: qty 90, 90d supply, fill #1
  Filled 2023-08-18: qty 90, 90d supply, fill #2

## 2022-11-30 MED ORDER — ENTRESTO 97-103 MG PO TABS
1.0000 | ORAL_TABLET | Freq: Two times a day (BID) | ORAL | 2 refills | Status: DC
  Filled 2022-11-30: qty 60, 30d supply, fill #0
  Filled 2023-01-01: qty 60, 30d supply, fill #1
  Filled 2023-03-20: qty 60, 30d supply, fill #2
  Filled 2023-04-18: qty 60, 30d supply, fill #3
  Filled 2023-05-22: qty 60, 30d supply, fill #4

## 2022-11-30 NOTE — Telephone Encounter (Signed)
Received call from [facility] w/ Dr. Consuello Bossier office, requesting 10/11/22 notes. I faxed [phone], ph [phone]

## 2022-12-01 ENCOUNTER — Ambulatory Visit (HOSPITAL_COMMUNITY): Admission: RE | Admit: 2022-12-01 | Payer: Medicaid Other | Source: Ambulatory Visit

## 2022-12-01 ENCOUNTER — Ambulatory Visit (HOSPITAL_COMMUNITY)
Admission: RE | Admit: 2022-12-01 | Discharge: 2022-12-01 | Disposition: A | Discharge status: Home / Self Care | Payer: Medicaid Other | Source: Ambulatory Visit | Attending: Pulmonary Disease | Admitting: Pulmonary Disease

## 2022-12-01 ENCOUNTER — Other Ambulatory Visit (HOSPITAL_COMMUNITY): Payer: Self-pay

## 2022-12-01 ENCOUNTER — Other Ambulatory Visit (HOSPITAL_COMMUNITY): Payer: Self-pay | Admitting: Pulmonary Disease

## 2022-12-01 ENCOUNTER — Other Ambulatory Visit: Payer: Self-pay

## 2022-12-01 ENCOUNTER — Encounter (HOSPITAL_COMMUNITY): Payer: Self-pay

## 2022-12-01 DIAGNOSIS — M25511 Pain in right shoulder: Secondary | ICD-10-CM | POA: Diagnosis present

## 2022-12-01 DIAGNOSIS — M542 Cervicalgia: Secondary | ICD-10-CM

## 2022-12-01 DIAGNOSIS — G8929 Other chronic pain: Secondary | ICD-10-CM | POA: Insufficient documentation

## 2022-12-04 ENCOUNTER — Ambulatory Visit (HOSPITAL_COMMUNITY)
Admission: RE | Admit: 2022-12-04 | Discharge: 2022-12-04 | Disposition: A | Discharge status: Home / Self Care | Payer: Medicaid Other | Source: Ambulatory Visit | Attending: Pulmonary Disease | Admitting: Pulmonary Disease

## 2022-12-04 DIAGNOSIS — M25511 Pain in right shoulder: Secondary | ICD-10-CM | POA: Insufficient documentation

## 2022-12-04 DIAGNOSIS — G8929 Other chronic pain: Secondary | ICD-10-CM | POA: Diagnosis present

## 2022-12-06 ENCOUNTER — Other Ambulatory Visit (HOSPITAL_COMMUNITY): Payer: Self-pay

## 2022-12-06 ENCOUNTER — Ambulatory Visit (INDEPENDENT_AMBULATORY_CARE_PROVIDER_SITE_OTHER): Payer: Medicaid Other | Admitting: Physician Assistant

## 2022-12-06 ENCOUNTER — Other Ambulatory Visit: Payer: Self-pay

## 2022-12-06 DIAGNOSIS — M25511 Pain in right shoulder: Secondary | ICD-10-CM

## 2022-12-06 MED ORDER — LIDOCAINE HCL 1 % IJ SOLN
9.0000 mL | INTRAMUSCULAR | Status: AC | PRN
Start: 2022-12-06 — End: 2022-12-06
  Administered 2022-12-06: 9 mL

## 2022-12-06 MED ORDER — HYDROCODONE-ACETAMINOPHEN 5-325 MG PO TABS
1.0000 | ORAL_TABLET | ORAL | 0 refills | Status: DC | PRN
  Filled 2022-12-06: qty 20, 4d supply, fill #0

## 2022-12-06 NOTE — Progress Notes (Signed)
Office Visit Note   Patient: Jordan Sparks           Date of Birth: 1975-10-12           MRN: [id number] Visit Date: 12/06/2022              Requested by: Corine Shelter, MD [address] [place],  [place] [address] PCP: Corine Shelter, MD  Chief Complaint  Patient presents with   Right Shoulder - Pain   Right Hip - Pain      HPI: Patient is a 47-year-old right-hand-dominant gentleman with a 3-week history of right superior shoulder pain.  Denies any injuries but does have repetitive work.  Denies any fever or chills.  He is a diabetic has had a recent 3 months ago A1c which was 9.7.  He admits he was not on his medications at the time because he could not afford them.  He is subsequently gone back on his diabetic medications and is checking his blood sugars.  He said they have come down significantly.  Said he thought he had blood work taken recently.  Describes right shoulder pain as significant moderate plus and awakens him at night.  Denies any fever or chills  Assessment & Plan: Visit Diagnoses: Impingement right shoulder  Plan: I reviewed his previous x-rays.  He does have some degenerative changes of the [facility] joint.  I think findings are mostly consistent with impingement syndrome.  I did do a fingerstick here today it was 264.  He says he has not eaten yet today.  I did tell him I have concerns for his diabetic control.  I will go forward and give him an intra-articular Toradol injection.  Will also refer him to physical therapy which he is willing to do.  Will give him just a very few hydrocodone if he has difficulty sleeping secondary to pain.  I only gave him 20 he understands I will refill these.  Emphasized the importance of controlling his diabetes as he is going to see Dr. Shon Baton next week for his hip and with his current blood sugars would not be eligible for a steroid injection  Follow-Up Instructions: No follow-ups on file.   Ortho Exam  Patient  is alert, oriented, no adenopathy, well-dressed, normal affect, normal respiratory effort. Examination of his right shoulder is no redness no erythema.  His grip strength is intact he has pain with overhead elevation internal rotation behind his back positive impingement findings no pain with external rotation.  Slight decrease in strength with resisted external and abduction may be secondary to pain  Imaging: No results found. No images are attached to the encounter.  Labs: Lab Results  Component Value Date   HGBA1C 9.7 (H) 09/07/2022   HGBA1C 7.0 (H) 08/29/2021   HGBA1C 7.7 (H) 05/04/2021   REPTSTATUS 02/13/2018 FINAL 02/08/2018   CULT  02/08/2018    NO GROWTH 5 DAYS Performed at [facility], [address] [place], [place] [address]      Lab Results  Component Value Date   ALBUMIN 3.4 (L) 09/07/2022   ALBUMIN 4.1 09/20/2021   ALBUMIN 4.0 09/09/2021    Lab Results  Component Value Date   MG 2.1 06/02/2021   MG 1.9 02/09/2018   No results found for: "VD25OH"  No results found for: "PREALBUMIN"    Latest Ref Rng & Units 09/07/2022    9:46 AM 09/20/2021    4:20 PM 06/03/2021    4:18 AM  CBC EXTENDED  WBC 4.0 - 10.5 K/uL 7.0  17.8  10.9   RBC 4.22 - 5.81 MIL/uL 5.14  5.35  5.75   Hemoglobin 13.0 - 17.0 g/dL 82.9  56.2  13.0   HCT 39.0 - 52.0 % 41.8  43.2  46.1   Platelets 150 - 400 K/uL 274  341  251   NEUT# 1.7 - 7.7 K/uL 4.8  13.7    Lymph# 0.7 - 4.0 K/uL 1.6  2.6       There is no height or weight on file to calculate BMI.  Orders:  No orders of the defined types were placed in this encounter.  No orders of the defined types were placed in this encounter.    Procedures: Large Joint Inj: R subacromial bursa on 12/06/2022 10:37 AM Indications: diagnostic evaluation and pain Details: 25 G 1.5 in needle, posterior approach  Arthrogram: No  Medications: 9 mL lidocaine 1 % Outcome: tolerated well, no immediate complications  After obtaining verbal  consent patient was sterilely prepped with alcohol and Betadine in the posterior shoulder.  A subacromial injection consisting of 9 cc of lidocaine and 1 cc of Toradol was injected patient tolerated procedure well Procedure, treatment alternatives, risks and benefits explained, specific risks discussed. Consent was given by the patient. Immediately prior to procedure a time out was called to verify the correct patient, procedure, equipment, support staff and site/side marked as required. Patient was prepped and draped in the usual sterile fashion.    Clinical Data: No additional findings.  ROS:  All other systems negative, except as noted in the HPI. Review of Systems  Objective: Vital Signs: There were no vitals taken for this visit.  Specialty Comments:  MRI LUMBAR SPINE WITHOUT CONTRAST   TECHNIQUE: Multiplanar, multisequence MR imaging of the lumbar spine was performed. No intravenous contrast was administered.   COMPARISON:  Lumbar spine radiographs 04/14/2021. CTA chest, abdomen, and pelvis 06/18/2020.   FINDINGS: The study is motion degraded, moderately so on the axial sequences.   Segmentation: Standard.   Alignment: Trace retrolisthesis of L4 on L5. Straightening of the normal lumbar lordosis.   Vertebrae: No fracture, suspicious marrow lesion, or significant marrow edema. Mild chronic degenerative endplate changes at L5-S1.   Conus medullaris and cauda equina: Conus extends to the L1-2 level. Conus and cauda equina appear normal.   Paraspinal and other soft tissues: Unremarkable.   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   T12-L1 and L1-2: Negative.   L2-3: Minimal disc bulging without stenosis.   L3-4: Mild disc bulging without significant stenosis.   L4-5: Mild disc desiccation. Circumferential disc bulging and mild facet hypertrophy result in mild spinal stenosis, mild bilateral lateral recess  stenosis, and mild-to-moderate bilateral neural foraminal stenosis.   L5-S1: Disc desiccation. Disc bulging and mild facet hypertrophy result in mild right and moderate left neural foraminal stenosis without spinal stenosis.   IMPRESSION: 1. Motion degraded examination. 2. 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     Electronically Signed   By: Sebastian Ache M.D.   On: 05/16/2021 18:00  PMFS History: Patient Active Problem List   Diagnosis Date Noted   Trigger thumb, right thumb 04/18/2022   Bipolar 1 disorder, mixed, moderate (HCC) 12/06/2021   Insomnia 12/06/2021   Encounter for long-term (current) use of  medications 12/06/2021   Unilateral primary osteoarthritis, right hip 11/30/2021   Carpal tunnel syndrome, right upper limb 11/30/2021   Low back pain 10/11/2021   Peripheral neuropathy 10/11/2021   Pain in left shoulder 08/31/2021   Spinal stenosis of cervical region 06/09/2021   Nonischemic cardiomyopathy (HCC) 06/06/2021   Chest pain 06/01/2021   Chest pain of uncertain etiology 05/31/2021   Obesity, Class III, BMI 40-49.9 (morbid obesity) (HCC) 05/31/2021   Diabetes mellitus (HCC) 05/04/2021   Sleep apnea 05/04/2021   Hypertensive urgency 10/19/2020   GERD (gastroesophageal reflux disease) 02/09/2018   Hyperglycemia 02/08/2018   Atypical chest pain 02/08/2018   Hypokalemia 02/08/2018   Leukocytosis 02/08/2018   Nausea and vomiting 02/08/2018   Mood disorder (HCC)    Hypertension    Anxiety    Hyperglycemia without ketosis    Manic disorder (HCC)    Past Medical History:  Diagnosis Date   Anxiety    Asthma    CHF (congestive heart failure) (HCC)    Coronary artery disease    Depression    Diabetes mellitus type 2 in obese    Hypertension    Manic disorder (HCC)    Obesity    Sleep apnea     Family History   Problem Relation Age of Onset   Seizures Brother    Heart failure Neg Hx     Past Surgical History:  Procedure Laterality Date   CARPAL TUNNEL RELEASE Right 12/19/2021   Procedure: RIGHT CARPAL TUNNEL RELEASE;  Surgeon: Eldred Manges, MD;  Location: [facility];  Service: Orthopedics;  Laterality: Right;   Dental procedure     Social History   Occupational History   Occupation: truck Hospital doctor    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, [employer]  Tobacco Use   Smoking status: Former    Current packs/day: 0.00    Average packs/day: 0.3 packs/day for 32.0 years (8.0 ttl pk-yrs)    Types: Cigarettes    Start date: 04/1989    Quit date: 04/2021    Years since quitting: 1.6   Smokeless tobacco: Never  Vaping Use   Vaping status: Never Used  Substance and Sexual Activity   Alcohol use: Not Currently    Comment: rarely   Drug use: Yes    Types: Marijuana    Comment: routinely   Sexual activity: Not on file

## 2022-12-07 ENCOUNTER — Other Ambulatory Visit: Payer: Self-pay

## 2022-12-07 ENCOUNTER — Ambulatory Visit: Payer: Medicaid Other | Attending: Physician Assistant

## 2022-12-07 DIAGNOSIS — M25511 Pain in right shoulder: Secondary | ICD-10-CM | POA: Insufficient documentation

## 2022-12-07 DIAGNOSIS — M6281 Muscle weakness (generalized): Secondary | ICD-10-CM | POA: Insufficient documentation

## 2022-12-07 NOTE — Addendum Note (Signed)
Addended by: Ileana Ladd on: 12/07/2022 10:25 AM   Modules accepted: Orders

## 2022-12-07 NOTE — Therapy (Addendum)
OUTPATIENT PHYSICAL THERAPY UPPER EXTREMITY EVALUATION   Patient Name: Jordan Sparks MRN: [id number] DOB:07-16-1975, 47 y.o., male Today's Date: 12/07/2022  END OF SESSION:  PT End of Session - 12/07/22 0927     Visit Number 1    Number of Visits 4    Date for PT Re-Evaluation 01/04/23    PT Start Time 0930    PT Stop Time 1015    PT Time Calculation (min) 45 min    Activity Tolerance Patient tolerated treatment well    Behavior During Therapy WFL for tasks assessed/performed             Past Medical History:  Diagnosis Date   Anxiety    Asthma    CHF (congestive heart failure) (HCC)    Coronary artery disease    Depression    Diabetes mellitus type 2 in obese    Hypertension    Manic disorder (HCC)    Obesity    Sleep apnea    Past Surgical History:  Procedure Laterality Date   CARPAL TUNNEL RELEASE Right 12/19/2021   Procedure: RIGHT CARPAL TUNNEL RELEASE;  Surgeon: Eldred Manges, MD;  Location: [facility];  Service: Orthopedics;  Laterality: Right;   Dental procedure     Patient Active Problem List   Diagnosis Date Noted   Trigger thumb, right thumb 04/18/2022   Bipolar 1 disorder, mixed, moderate (HCC) 12/06/2021   Insomnia 12/06/2021   Encounter for long-term (current) use of medications 12/06/2021   Unilateral primary osteoarthritis, right hip 11/30/2021   Carpal tunnel syndrome, right upper limb 11/30/2021   Low back pain 10/11/2021   Peripheral neuropathy 10/11/2021   Pain in left shoulder 08/31/2021   Spinal stenosis of cervical region 06/09/2021   Nonischemic cardiomyopathy (HCC) 06/06/2021   Chest pain 06/01/2021   Chest pain of uncertain etiology 05/31/2021   Obesity, Class III, BMI 40-49.9 (morbid obesity) (HCC) 05/31/2021   Diabetes mellitus (HCC) 05/04/2021   Sleep apnea 05/04/2021   Hypertensive urgency 10/19/2020   GERD (gastroesophageal reflux disease) 02/09/2018   Hyperglycemia 02/08/2018   Atypical chest  pain 02/08/2018   Hypokalemia 02/08/2018   Leukocytosis 02/08/2018   Nausea and vomiting 02/08/2018   Mood disorder (HCC)    Hypertension    Anxiety    Hyperglycemia without ketosis    Manic disorder (HCC)     PCP: Dr. Corine Shelter  REFERRING PROVIDER: Persons, [place], [place]  REFERRING DIAG: M25.511 (ICD-10-CM) - Acute pain of right shoulder  THERAPY DIAG:  Acute pain of right shoulder  Muscle weakness (generalized)  Rationale for Evaluation and Treatment: Rehabilitation  ONSET DATE: 12/06/2022  SUBJECTIVE:  SUBJECTIVE STATEMENT: Pt reports of insidious onset of pain that started >3 weeks ago. 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 Patient reports of pain on top and lateral aspect of shoulder. Pt reports pain radiating to R side of neck and to the hand. Pt reports of numbness and tingling in the whole hand. Pt had carpel tunnel surgery about 1 year ago where numbness and tingling was resolved but it came back after shoulder injury. Pt reports difficulty with sleeping at night as shoulder pain awakens him.  Hand dominance: Right  PERTINENT HISTORY: DM  PAIN:  Are you having pain? Yes: NPRS scale: 11/10 Pain location: R shoulder, neck, R arm Pain description: achy, sharp, throbbing, constant Aggravating factors: movement, pain at rest Relieving factors: medication  PRECAUTIONS: None  RED FLAGS: None   WEIGHT BEARING RESTRICTIONS: No  FALLS:  Has patient fallen in last 6 months? No   OCCUPATION: Not employed currently  PATIENT GOALS: Improve shoulder pain    OBJECTIVE:   DIAGNOSTIC FINDINGS:  Mild arthritis per X-ray  PATIENT SURVEYS :  UEFS 76%  COGNITION: Overall cognitive status: Within  functional limits for tasks assessed     SENSATION: WFL  POSTURE: Forward head, rounded shoulder  UPPER EXTREMITY ROM: Patient has full AROM but with moderate to severe pain with all planes of the motion.   UPPER EXTREMITY MMT:  MMT Right eval Left eval  Shoulder flexion    Shoulder extension    Shoulder abduction    Shoulder adduction    Shoulder internal rotation    Shoulder external rotation    Middle trapezius    Lower trapezius    Elbow flexion    Elbow extension    Wrist flexion    Wrist extension    Wrist ulnar deviation    Wrist radial deviation    Wrist pronation    Wrist supination    Grip strength (lbs)    (Blank rows = not tested)  PALPATION:  Difficult to assess as patient is sore grossly in shoulder but most tenderness was palpable over proximal biceps tendon and supraspinatus tendon in R shoulder.   TODAY'S TREATMENT:                                                                                                                                         DATE:  Pt educated on pain management by not pushing through pain with following exercises. Supine Shoulder Press with Dowel  - 2 x daily - 7 x weekly - 2 sets - 10 reps - Supine Shoulder Flexion Extension AAROM with Dowel  - 2 x daily - 7 x weekly - 2 sets - 10 reps - Standing Shoulder Extension with Dowel  - 2 x daily - 7 x weekly - 2 sets - 10 reps - Standing Shoulder  Internal Rotation AAROM with Dowel  - 2 x daily - 7 x weekly - 2 sets - 10 reps - Seated Shoulder Flexion Towel Slide at Table Top  - 2 x daily - 7 x weekly - 2 sets - 10 reps Pt reported pain levels decreased to 8/10 Pt educated on using ice pack for 20 min vs heating pad. Pt educated on doing exercises to improve pain before applying ice pack.  Patient reported that his DM is not under control again. Pt educated on importance of keeping his blood sugar under control despite increased stress and pain, to avoid and adverse medical  events.  PATIENT EDUCATION: Education details: see above Person educated: Patient Education method: Explanation Education comprehension: verbalized understanding  HOME EXERCISE PROGRAM: Access Code: [email or web address] URL: [email or web address] Date: 12/07/2022 Prepared by: Lavone Nian  Exercises - Supine Shoulder Press with Dowel  - 2 x daily - 7 x weekly - 2 sets - 10 reps - Supine Shoulder Flexion Extension AAROM with Dowel  - 2 x daily - 7 x weekly - 2 sets - 10 reps - Standing Shoulder Extension with Dowel  - 2 x daily - 7 x weekly - 2 sets - 10 reps - Standing Shoulder Internal Rotation AAROM with Dowel  - 2 x daily - 7 x weekly - 2 sets - 10 reps - Seated Shoulder Flexion Towel Slide at Table Top  - 2 x daily - 7 x weekly - 2 sets - 10 reps  ASSESSMENT:  CLINICAL IMPRESSION: Patient is a 47 y.o. male who was seen today for physical therapy evaluation and treatment for acute R shoulder pain that started about 3 weeks ago insidiously. Currently paint has WNL of AROM but with significant levels of pain throughout the range, most likely due to increased allodynia and muscle guarding/spasms. Patient demonstrates guarded strength with 3+/5 grossly due to pain. Patient demonstrates significant disability due to R shoulder pain per Upper Extremity Functional Scale. Patient will benefit from skilled PT to address above impairments and improve overall function to return to PLOF.Marland Kitchen    OBJECTIVE IMPAIRMENTS: decreased ROM, decreased strength, hypomobility, increased muscle spasms, impaired flexibility, impaired UE functional use, postural dysfunction, and pain.   ACTIVITY LIMITATIONS: carrying, lifting, sleeping, bathing, toileting, dressing, reach over head, and hygiene/grooming  PARTICIPATION LIMITATIONS: meal prep, cleaning, laundry, driving, shopping, community activity, and occupation  PERSONAL FACTORS: Time since onset of injury/illness/exacerbation are also affecting patient's  functional outcome.   REHAB POTENTIAL: Good  CLINICAL DECISION MAKING: Stable/uncomplicated  EVALUATION COMPLEXITY: Low  GOALS: Goals reviewed with patient? Yes  SHORT TERM GOALS: Target date: 01/04/2023    Patient will demo 50% compliance with HEP to self manage his symptoms. Baseline:Issued 12/07/22  Goal status: INITIAL  2.  Pt will report no radiation of pain below R elbow to improve ability to use R UE with daily function. Baseline: pt reports of pain, numbness and tingling going down to his R hand (12/07/22) Goal status: INITIAL   LONG TERM GOALS: Target date: 02/01/2023    Patient will demo WNL of AROM without any pain to improve functional reaching Baseline: WNL AROM but with 10/10 pain Goal status: INITIAL  2.  Patient will demo 5/5 MMT in R shoulder without pain to improve ability to lift and carry. Baseline: 3+/5- guarded by pain Goal status: INITIAL  3.  Patient will demo >15 points improvement in UEFS test to improve overall function. Baseline: 76% UEFS (12/07/22) Goal status: INITIAL  4.  Patient will  be able to sleep through night without waking up from shoulder pain. Baseline: wakes patient up every 1 hour Goal status: INITIAL  5.  Patient will be 100% compliant with HEP to self manage his symptoms. Baseline: Issued on 12/07/22 Goal status: INITIAL    PLAN: PT FREQUENCY: 2x/week  PT DURATION: 8 weeks  PLANNED INTERVENTIONS: Therapeutic exercises, Therapeutic activity, Neuromuscular re-education, Balance training, Gait training, Patient/Family education, Self Care, Joint mobilization, Cryotherapy, Moist heat, Manual therapy, and Re-evaluation  PLAN FOR NEXT SESSION: Review HEP   Ileana Ladd, PT 12/07/2022, 10:23 AM

## 2022-12-12 NOTE — Therapy (Signed)
OUTPATIENT PHYSICAL THERAPY UPPER EXTREMITY TREATMENT NOTE   Patient Name: Jordan Sparks MRN: [id number] DOB:October 13, 1975, 47 y.o., male Today's Date: 12/13/2022  END OF SESSION:  PT End of Session - 12/13/22 0942     Visit Number 2    Number of Visits 4    Date for PT Re-Evaluation 01/04/23    PT Start Time 0930    PT Stop Time 1015    PT Time Calculation (min) 45 min    Activity Tolerance Patient tolerated treatment well    Behavior During Therapy WFL for tasks assessed/performed              Past Medical History:  Diagnosis Date   Anxiety    Asthma    CHF (congestive heart failure) (HCC)    Coronary artery disease    Depression    Diabetes mellitus type 2 in obese    Hypertension    Manic disorder (HCC)    Obesity    Sleep apnea    Past Surgical History:  Procedure Laterality Date   CARPAL TUNNEL RELEASE Right 12/19/2021   Procedure: RIGHT CARPAL TUNNEL RELEASE;  Surgeon: Eldred Manges, MD;  Location: [facility];  Service: Orthopedics;  Laterality: Right;   Dental procedure     Patient Active Problem List   Diagnosis Date Noted   Trigger thumb, right thumb 04/18/2022   Bipolar 1 disorder, mixed, moderate (HCC) 12/06/2021   Insomnia 12/06/2021   Encounter for long-term (current) use of medications 12/06/2021   Unilateral primary osteoarthritis, right hip 11/30/2021   Carpal tunnel syndrome, right upper limb 11/30/2021   Low back pain 10/11/2021   Peripheral neuropathy 10/11/2021   Pain in left shoulder 08/31/2021   Spinal stenosis of cervical region 06/09/2021   Nonischemic cardiomyopathy (HCC) 06/06/2021   Chest pain 06/01/2021   Chest pain of uncertain etiology 05/31/2021   Obesity, Class III, BMI 40-49.9 (morbid obesity) (HCC) 05/31/2021   Diabetes mellitus (HCC) 05/04/2021   Sleep apnea 05/04/2021   Hypertensive urgency 10/19/2020   GERD (gastroesophageal reflux disease) 02/09/2018   Hyperglycemia 02/08/2018   Atypical  chest pain 02/08/2018   Hypokalemia 02/08/2018   Leukocytosis 02/08/2018   Nausea and vomiting 02/08/2018   Mood disorder (HCC)    Hypertension    Anxiety    Hyperglycemia without ketosis    Manic disorder (HCC)     PCP: Dr. Corine Shelter  REFERRING PROVIDER: Persons, [place], [place]  REFERRING DIAG: M25.511 (ICD-10-CM) - Acute pain of right shoulder  THERAPY DIAG:  Acute pain of right shoulder  Muscle weakness (generalized)  Rationale for Evaluation and Treatment: Rehabilitation  ONSET DATE: 12/06/2022  SUBJECTIVE:  SUBJECTIVE STATEMENT: Pt reports shoulder is little better. Still having hard time to sleep at night. He has been doing exercises at home Hand dominance: Right  PERTINENT HISTORY: DM  PAIN:  Are you having pain? Yes: NPRS scale:9/10 Pain location: R shoulder, neck, R arm Pain description: achy, sharp, throbbing, constant Aggravating factors: movement, pain at rest Relieving factors: medication  PRECAUTIONS: None  RED FLAGS: None   WEIGHT BEARING RESTRICTIONS: No  FALLS:  Has patient fallen in last 6 months? No   OCCUPATION: Not employed currently  PATIENT GOALS: Improve shoulder pain    OBJECTIVE:   DIAGNOSTIC FINDINGS:  Mild arthritis per X-ray  PATIENT SURVEYS :  UEFS 76%  COGNITION: Overall cognitive status: Within functional limits for tasks assessed     SENSATION: WFL  POSTURE: Forward head, rounded shoulder  UPPER EXTREMITY ROM: Patient has full AROM but with moderate to severe pain with all planes of the motion.   UPPER EXTREMITY MMT:  MMT Right eval Left eval  Shoulder flexion    Shoulder extension    Shoulder abduction    Shoulder adduction    Shoulder internal rotation    Shoulder external rotation    Middle trapezius     Lower trapezius    Elbow flexion    Elbow extension    Wrist flexion    Wrist extension    Wrist ulnar deviation    Wrist radial deviation    Wrist pronation    Wrist supination    Grip strength (lbs)    (Blank rows = not tested)  PALPATION:  Difficult to assess as patient is sore grossly in shoulder but most tenderness was palpable over proximal biceps tendon and supraspinatus tendon in R shoulder.   TODAY'S TREATMENT:                                                                                                                                         DATE:  Supine foam roll massage to biceps and anterior and middle deltoids Grade III long axis Glenohumeral distraction PROM into external, internal rotations and flexion  Supine chest press: 5lb bar: 3 x 10, cues for arms in scaption Supine OH flexion: 2lb bar: 2 x 10, cues for pain free range. SL shoulder abduction: 2lbs upto 90 deg: 2 x 10 SL shoulder External rotation: 2lbs 2 x 10 AAROM with UE ranger in to flexion: with Blue sport cord inferiro pull at proximal Glenohumeral joint: 20x Bent over rows: 5lbs 2 x 10 R only Biceps curls: 5lbs with palms down: 5x, 2lbs palms down: 10x, cues to avoid flexing shoulder Sci Fit: 0 resistance: 10', pt educated to use R UE for 15 sec every minute and then relaxing where he just rest arm on handle and uses bil LE and L UE for most of the pushing.  Pt educated on swinging his R UE more with gait  to let arm stretch and relax.  PATIENT EDUCATION: Education details: see above Person educated: Patient Education method: Explanation Education comprehension: verbalized understanding  HOME EXERCISE PROGRAM: Access Code: [email or web address] URL: [email or web address] Date: 12/07/2022 Prepared by: Lavone Nian  Exercises - Supine Shoulder Press with Dowel  - 2 x daily - 7 x weekly - 2 sets - 10 reps - Supine Shoulder Flexion Extension AAROM with Dowel  - 2 x daily - 7 x weekly - 2 sets  - 10 reps - Standing Shoulder Extension with Dowel  - 2 x daily - 7 x weekly - 2 sets - 10 reps - Standing Shoulder Internal Rotation AAROM with Dowel  - 2 x daily - 7 x weekly - 2 sets - 10 reps - Seated Shoulder Flexion Towel Slide at Table Top  - 2 x daily - 7 x weekly - 2 sets - 10 reps  ASSESSMENT:  CLINICAL IMPRESSION: Today's session was focused on continued progression of ROM and added strengthening to the program. Patient tolerated session well and reported decreased pain at end of the sesison. Pt demo significant weakness in R shoulder with certain motions.   OBJECTIVE IMPAIRMENTS: decreased ROM, decreased strength, hypomobility, increased muscle spasms, impaired flexibility, impaired UE functional use, postural dysfunction, and pain.   ACTIVITY LIMITATIONS: carrying, lifting, sleeping, bathing, toileting, dressing, reach over head, and hygiene/grooming  PARTICIPATION LIMITATIONS: meal prep, cleaning, laundry, driving, shopping, community activity, and occupation  PERSONAL FACTORS: Time since onset of injury/illness/exacerbation are also affecting patient's functional outcome.   REHAB POTENTIAL: Good  CLINICAL DECISION MAKING: Stable/uncomplicated  EVALUATION COMPLEXITY: Low  GOALS: Goals reviewed with patient? Yes  SHORT TERM GOALS: Target date: 01/04/2023    Patient will demo 50% compliance with HEP to self manage his symptoms. Baseline:Issued 12/07/22  Goal status: INITIAL  2.  Pt will report no radiation of pain below R elbow to improve ability to use R UE with daily function. Baseline: pt reports of pain, numbness and tingling going down to his R hand (12/07/22) Goal status: INITIAL   LONG TERM GOALS: Target date: 02/01/2023    Patient will demo WNL of AROM without any pain to improve functional reaching Baseline: WNL AROM but with 10/10 pain Goal status: INITIAL  2.  Patient will demo 5/5 MMT in R shoulder without pain to improve ability to lift and  carry. Baseline: 3+/5- guarded by pain Goal status: INITIAL  3.  Patient will demo >15 points improvement in UEFS test to improve overall function. Baseline: 76% UEFS (12/07/22) Goal status: INITIAL  4.  Patient will be able to sleep through night without waking up from shoulder pain. Baseline: wakes patient up every 1 hour Goal status: INITIAL  5.  Patient will be 100% compliant with HEP to self manage his symptoms. Baseline: Issued on 12/07/22 Goal status: INITIAL    PLAN: PT FREQUENCY: 2x/week  PT DURATION: 8 weeks  PLANNED INTERVENTIONS: Therapeutic exercises, Therapeutic activity, Neuromuscular re-education, Balance training, Gait training, Patient/Family education, Self Care, Joint mobilization, Cryotherapy, Moist heat, Manual therapy, and Re-evaluation  PLAN FOR NEXT SESSION: Review HEP   Ileana Ladd, PT 12/13/2022, 9:43 AM

## 2022-12-13 ENCOUNTER — Ambulatory Visit: Payer: Medicaid Other | Attending: Physician Assistant

## 2022-12-13 DIAGNOSIS — M25511 Pain in right shoulder: Secondary | ICD-10-CM | POA: Diagnosis present

## 2022-12-13 DIAGNOSIS — M5459 Other low back pain: Secondary | ICD-10-CM | POA: Diagnosis present

## 2022-12-13 DIAGNOSIS — M6281 Muscle weakness (generalized): Secondary | ICD-10-CM | POA: Diagnosis present

## 2022-12-13 DIAGNOSIS — R262 Difficulty in walking, not elsewhere classified: Secondary | ICD-10-CM | POA: Diagnosis present

## 2022-12-15 ENCOUNTER — Ambulatory Visit: Payer: Medicaid Other

## 2022-12-15 ENCOUNTER — Ambulatory Visit (INDEPENDENT_AMBULATORY_CARE_PROVIDER_SITE_OTHER): Payer: Medicaid Other | Admitting: Sports Medicine

## 2022-12-15 ENCOUNTER — Encounter: Payer: Self-pay | Admitting: Sports Medicine

## 2022-12-15 DIAGNOSIS — M1611 Unilateral primary osteoarthritis, right hip: Secondary | ICD-10-CM

## 2022-12-15 DIAGNOSIS — M25511 Pain in right shoulder: Secondary | ICD-10-CM

## 2022-12-15 DIAGNOSIS — M6281 Muscle weakness (generalized): Secondary | ICD-10-CM

## 2022-12-15 DIAGNOSIS — E11649 Type 2 diabetes mellitus with hypoglycemia without coma: Secondary | ICD-10-CM | POA: Diagnosis not present

## 2022-12-15 DIAGNOSIS — R262 Difficulty in walking, not elsewhere classified: Secondary | ICD-10-CM

## 2022-12-15 DIAGNOSIS — M12811 Other specific arthropathies, not elsewhere classified, right shoulder: Secondary | ICD-10-CM

## 2022-12-15 DIAGNOSIS — M5459 Other low back pain: Secondary | ICD-10-CM

## 2022-12-15 MED ORDER — LIDOCAINE HCL 1 % IJ SOLN
2.0000 mL | INTRAMUSCULAR | Status: AC | PRN
Start: 2022-12-15 — End: 2022-12-15
  Administered 2022-12-15: 2 mL

## 2022-12-15 MED ORDER — BUPIVACAINE HCL 0.25 % IJ SOLN
2.0000 mL | INTRAMUSCULAR | Status: AC | PRN
Start: 2022-12-15 — End: 2022-12-15
  Administered 2022-12-15: 2 mL via INTRA_ARTICULAR

## 2022-12-15 MED ORDER — METHYLPREDNISOLONE ACETATE 40 MG/ML IJ SUSP
40.0000 mg | INTRAMUSCULAR | Status: AC | PRN
Start: 2022-12-15 — End: 2022-12-15
  Administered 2022-12-15: 40 mg via INTRA_ARTICULAR

## 2022-12-15 NOTE — Progress Notes (Signed)
Jordan Sparks - 47 y.o. male MRN [id number]  Date of birth: October 29, 1975  Office Visit Note: Visit Date: 12/15/2022 PCP: Corine Shelter, MD Referred by: Corine Shelter, MD  Subjective: Chief Complaint  Patient presents with   Right Hip - Pain   Right Shoulder - Pain   HPI: Jordan Sparks is a pleasant 47 y.o. male who presents today for evaluation of right shoulder pain and follow-up of chronic right hip pain.  Chronic right hip pain - has known advancing arthritis with sclerotic change about the hip.  Back in September of last year I did perform an intra-articular hip injection which gave him many months of relief.  His pain still continues deep within the hip.  Right shoulder pain -he has had at least 1 month of right anterolateral shoulder pain.  Previously saw my partner, Macon Large persons, PA, sugars were elevated at that time so she had given him a Toradol injection.  He also has been started in formalized physical therapy and does note that his symptoms have somewhat improved.  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  He has pain but also has weakness with certain reaching motions.  Denies any numbness or tingling.  He is taking naproxen without much relief.  No previous shoulder injury.  He does have type 2 diabetes and his recent A1c was elevated 3 months ago, but he states he was out of his diabetic meds for at least 2 months.  Here over the last few months he has returned on this and his sugars throughout the day are usually from 140/150's.  Lab Results  Component Value Date   HGBA1C 9.7 (H) 09/07/2022   Medications: Naproxen, gabapentin 300 mg twice daily.  He is also on Cymbalta 60 mg twice daily.  Pertinent ROS were reviewed with the patient and found to be negative unless otherwise specified above in HPI.   Assessment & Plan: Visit Diagnoses:  1. Unilateral primary osteoarthritis, right hip   2.  Rotator cuff arthropathy of right shoulder   3. Acute pain of right shoulder   4. Type 2 diabetes mellitus with hypoglycemia without coma, without long-term current use of insulin (HCC)    Plan: Discussed with [name] the nature of his right shoulder pain x 1 month.  He was performing a lot of repetitive activity with heavy cardboard and other wear hats activity.  He does have some impingement findings on exam, but he has definite weakness with rotator cuff testing and I am more worried about a degenerative rotator cuff tear.  His pain is quite significant today, through shared decision making we did proceed with subacromial joint injection.  He does have type 2 diabetes and his last A1c was not ideal but he has been checking his sugars over the past 1-2 months being back on medicine and needs are much more at goal.  He will continue his formal physical therapy and transition to a home rehab program for the right shoulder.  Depending on his improvement from the injection and his exercises, we may need to obtain an MRI of the right shoulder to evaluate for any rotator cuff tearing, will hold for now until reevaluation.  He does have chronic right hip osteoarthritis and has been experiencing an exacerbation of this.  He does have exercises and stretching that he may perform.  Encouraged to continue naproxen once to twice daily as needed.  He may continue his gabapentin  300 mg twice daily.  He needs to continue his diabetic medication including metformin 1000 mg twice daily, Farxiga 10 mg. we will see him back in about 3 weeks or so to reevaluate the shoulder and the hip.  Could consider intra-articular hip injection if his pain does not settle down and his glucose are in better control.  He will bring in his monitor at next visit.  Follow-up: Return in 3-4 weeks for f/u R-shoulder, R-hip   Meds & Orders: No orders of the defined types were placed in this encounter.  No orders of the defined types were placed in  this encounter.    Procedures: Large Joint Inj: R subacromial bursa on 12/15/2022 4:04 PM Indications: pain Details: 22 G 1.5 in needle, posterior approach Medications: 2 mL lidocaine 1 %; 2 mL bupivacaine 0.25 %; 40 mg methylPREDNISolone acetate 40 MG/ML Outcome: tolerated well, no immediate complications  Subacromial Joint Injection, Right Shoulder After discussion on risks/benefits/indications, informed verbal consent was obtained. A timeout was then performed. Patient was seated on table in exam room. The patient's shoulder was prepped with betadine and alcohol swabs and utilizing posterior approach a 22G, 1.5" needle was directed anteriorly and laterally into the patient's subacromial space was injected with 2:2:1 mixture of lidocaine:bupivicaine:depomedrol with appreciation of free-flowing of the injectate into the bursal space. Patient tolerated the procedure well without immediate complications.   Procedure, treatment alternatives, risks and benefits explained, specific risks discussed. Consent was given by the patient. Immediately prior to procedure a time out was called to verify the correct patient, procedure, equipment, support staff and site/side marked as required. Patient was prepped and draped in the usual sterile fashion.          Clinical History: MRI LUMBAR SPINE WITHOUT CONTRAST   TECHNIQUE: Multiplanar, multisequence MR imaging of the lumbar spine was performed. No intravenous contrast was administered.   COMPARISON:  Lumbar spine radiographs 04/14/2021. CTA chest, abdomen, and pelvis 06/18/2020.   FINDINGS: The study is motion degraded, moderately so on the axial sequences.   Segmentation: Standard.   Alignment: Trace retrolisthesis of L4 on L5. Straightening of the normal lumbar lordosis.   Vertebrae: No fracture, suspicious marrow lesion, or significant marrow edema. Mild chronic degenerative endplate changes at L5-S1.   Conus medullaris and cauda  equina: Conus extends to the L1-2 level. Conus and cauda equina appear normal.   Paraspinal and other soft tissues: Unremarkable.   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   T12-L1 and L1-2: Negative.   L2-3: Minimal disc bulging without stenosis.   L3-4: Mild disc bulging without significant stenosis.   L4-5: Mild disc desiccation. Circumferential disc bulging and mild facet hypertrophy result in mild spinal stenosis, mild bilateral lateral recess stenosis, and mild-to-moderate bilateral neural foraminal stenosis.   L5-S1: Disc desiccation. Disc bulging and mild facet hypertrophy result in mild right and moderate left neural foraminal stenosis without spinal stenosis.   IMPRESSION: 1. Motion degraded examination. 2. 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     Electronically Signed   By: Sebastian Ache M.D.   On: 05/16/2021 18:00  He reports that he quit smoking about 20 months ago. His smoking use included cigarettes. He started smoking about 33 years ago. He has a 8 pack-year smoking history. He has never used smokeless tobacco.  Recent Labs    09/07/22 0946  HGBA1C 9.7*    Objective:    Physical Exam  Gen: Well-appearing, in no acute distress; non-toxic CV: Well-perfused. Warm.  Resp: Breathing unlabored on room air; no wheezing. Psych: Fluid speech in conversation; appropriate affect; normal thought process Neuro: Sensation intact throughout. No gross coordination deficits.   Ortho Exam -Right shoulder: Minimal TTP over the [facility] joint, positive pain at Codman's point.  He has full active and passive range of motion of the shoulder.  There is a positive drop arm test, pain and weakness with empty can and resisted external rotation.  There is pain with Gerber liftoff,  but full range of motion with internal rotation and no strength deficits with internal resistance.  -Right hip: No bony tenderness.  There is pain with motion in all planes at end range.  Positive FADIR and internal rotation testing.  Imaging:  DG Cervical Spine 2 or 3 views CLINICAL DATA:  Cervicalgia.  Neck pain  EXAM: CERVICAL SPINE - 2-3 VIEW  COMPARISON:  None Available.  FINDINGS: Mild straightening of normal lordosis. No listhesis. Anterior spurring from C4-C5 through C6-C7 with preservation of disc spaces. No significant facet changes. No evidence of fracture or focal bone abnormality. No prevertebral soft tissue thickening.  IMPRESSION: Spondylosis with anterior spurring from C4-C5 through C6-C7.  Electronically Signed   By: Narda Rutherford M.D.   On: 12/14/2022 22:21 DG Shoulder Right CLINICAL DATA:  Chronic right shoulder pain.  EXAM: RIGHT SHOULDER - 2+ VIEW  COMPARISON:  No prior right shoulder exams available.  FINDINGS: There is no evidence of fracture or dislocation. Mild acromioclavicular acromioclavicular degenerative change. The glenohumeral joint appears normal. No erosive change or focal bone abnormality. Soft tissues are unremarkable.  IMPRESSION: Mild acromioclavicular degenerative change.  Electronically Signed   By: Narda Rutherford M.D.   On: 12/14/2022 22:20  *Independent review and interpretation of three-view x-ray of the right shoulder from 12/04/2022 was performed by myself today.  X-rays demonstrate moderate AC joint degenerative change.  The humeral head is well located.  He does have a downsloping acromion.  No acute bony abnormality or fracture otherwise noted.  Past Medical/Family/Surgical/Social History: Medications & Allergies reviewed per EMR, new medications updated. Patient Active Problem List   Diagnosis Date Noted   Trigger thumb, right thumb 04/18/2022   Bipolar 1 disorder, mixed, moderate (HCC) 12/06/2021   Insomnia  12/06/2021   Encounter for long-term (current) use of medications 12/06/2021   Unilateral primary osteoarthritis, right hip 11/30/2021   Carpal tunnel syndrome, right upper limb 11/30/2021   Low back pain 10/11/2021   Peripheral neuropathy 10/11/2021   Pain in left shoulder 08/31/2021   Spinal stenosis of cervical region 06/09/2021   Nonischemic cardiomyopathy (HCC) 06/06/2021   Chest pain 06/01/2021   Chest pain of uncertain etiology 05/31/2021   Obesity, Class III, BMI 40-49.9 (morbid obesity) (HCC) 05/31/2021   Diabetes mellitus (HCC) 05/04/2021   Sleep apnea 05/04/2021   Hypertensive urgency 10/19/2020   GERD (gastroesophageal reflux disease) 02/09/2018   Hyperglycemia 02/08/2018   Atypical chest pain 02/08/2018   Hypokalemia 02/08/2018   Leukocytosis 02/08/2018   Nausea and vomiting 02/08/2018   Mood disorder (HCC)    Hypertension    Anxiety    Hyperglycemia without ketosis    Manic disorder (HCC)    Past Medical History:  Diagnosis Date   Anxiety    Asthma    CHF (congestive heart failure) (HCC)    Coronary artery disease  Depression    Diabetes mellitus type 2 in obese    Hypertension    Manic disorder (HCC)    Obesity    Sleep apnea    Family History  Problem Relation Age of Onset   Seizures Brother    Heart failure Neg Hx    Past Surgical History:  Procedure Laterality Date   CARPAL TUNNEL RELEASE Right 12/19/2021   Procedure: RIGHT CARPAL TUNNEL RELEASE;  Surgeon: Eldred Manges, MD;  Location: [facility];  Service: Orthopedics;  Laterality: Right;   Dental procedure     Social History   Occupational History   Occupation: truck Hospital doctor    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, [employer]  Tobacco Use   Smoking status: Former    Current packs/day: 0.00    Average packs/day: 0.3 packs/day for 32.0 years (8.0 ttl pk-yrs)    Types: Cigarettes    Start date: 04/1989    Quit date: 04/2021    Years since quitting: 1.6   Smokeless tobacco:  Never  Vaping Use   Vaping status: Never Used  Substance and Sexual Activity   Alcohol use: Not Currently    Comment: rarely   Drug use: Yes    Types: Marijuana    Comment: routinely   Sexual activity: Not on file

## 2022-12-15 NOTE — Therapy (Signed)
OUTPATIENT PHYSICAL THERAPY UPPER EXTREMITY TREATMENT NOTE   Patient Name: Jordan Sparks MRN: [id number] DOB:09-30-1975, 47 y.o., male Today's Date: 12/15/2022  END OF SESSION:  PT End of Session - 12/15/22 1131     Visit Number 3    Number of Visits 4    Date for PT Re-Evaluation 01/04/23    PT Start Time 1105    PT Stop Time 1145    PT Time Calculation (min) 40 min    Activity Tolerance Patient tolerated treatment well    Behavior During Therapy WFL for tasks assessed/performed              Past Medical History:  Diagnosis Date   Anxiety    Asthma    CHF (congestive heart failure) (HCC)    Coronary artery disease    Depression    Diabetes mellitus type 2 in obese    Hypertension    Manic disorder (HCC)    Obesity    Sleep apnea    Past Surgical History:  Procedure Laterality Date   CARPAL TUNNEL RELEASE Right 12/19/2021   Procedure: RIGHT CARPAL TUNNEL RELEASE;  Surgeon: Eldred Manges, MD;  Location: [facility];  Service: Orthopedics;  Laterality: Right;   Dental procedure     Patient Active Problem List   Diagnosis Date Noted   Trigger thumb, right thumb 04/18/2022   Bipolar 1 disorder, mixed, moderate (HCC) 12/06/2021   Insomnia 12/06/2021   Encounter for long-term (current) use of medications 12/06/2021   Unilateral primary osteoarthritis, right hip 11/30/2021   Carpal tunnel syndrome, right upper limb 11/30/2021   Low back pain 10/11/2021   Peripheral neuropathy 10/11/2021   Pain in left shoulder 08/31/2021   Spinal stenosis of cervical region 06/09/2021   Nonischemic cardiomyopathy (HCC) 06/06/2021   Chest pain 06/01/2021   Chest pain of uncertain etiology 05/31/2021   Obesity, Class III, BMI 40-49.9 (morbid obesity) (HCC) 05/31/2021   Diabetes mellitus (HCC) 05/04/2021   Sleep apnea 05/04/2021   Hypertensive urgency 10/19/2020   GERD (gastroesophageal reflux disease) 02/09/2018   Hyperglycemia 02/08/2018   Atypical  chest pain 02/08/2018   Hypokalemia 02/08/2018   Leukocytosis 02/08/2018   Nausea and vomiting 02/08/2018   Mood disorder (HCC)    Hypertension    Anxiety    Hyperglycemia without ketosis    Manic disorder (HCC)     PCP: Dr. Corine Shelter  REFERRING PROVIDER: Persons, [place], [place]  REFERRING DIAG: M25.511 (ICD-10-CM) - Acute pain of right shoulder  THERAPY DIAG:  Acute pain of right shoulder  Muscle weakness (generalized)  Other low back pain  Difficulty in walking, not elsewhere classified  Rationale for Evaluation and Treatment: Rehabilitation  ONSET DATE: 12/06/2022  SUBJECTIVE:  SUBJECTIVE STATEMENT: Pt reports yesterday shoulder felt really good and had very minimal pain. Today he woke up with pain from at night.  Hand dominance: Right  PERTINENT HISTORY: DM  PAIN:  Are you having pain? Yes: NPRS scale:7/10 Pain location: R shoulder, neck, R arm Pain description: achy, sharp, throbbing, constant Aggravating factors: movement, pain at rest Relieving factors: medication  PRECAUTIONS: None  RED FLAGS: None   WEIGHT BEARING RESTRICTIONS: No  FALLS:  Has patient fallen in last 6 months? No   OCCUPATION: Not employed currently  PATIENT GOALS: Improve shoulder pain    OBJECTIVE:   DIAGNOSTIC FINDINGS:  Mild arthritis per X-ray  PATIENT SURVEYS :  UEFS 76%  COGNITION: Overall cognitive status: Within functional limits for tasks assessed     SENSATION: WFL  POSTURE: Forward head, rounded shoulder  UPPER EXTREMITY ROM: Patient has full AROM but with moderate to severe pain with all planes of the motion.   UPPER EXTREMITY MMT:  MMT Right eval Left eval  Shoulder flexion    Shoulder extension    Shoulder abduction    Shoulder adduction    Shoulder  internal rotation    Shoulder external rotation    Middle trapezius    Lower trapezius    Elbow flexion    Elbow extension    Wrist flexion    Wrist extension    Wrist ulnar deviation    Wrist radial deviation    Wrist pronation    Wrist supination    Grip strength (lbs)    (Blank rows = not tested)  PALPATION:  Difficult to assess as patient is sore grossly in shoulder but most tenderness was palpable over proximal biceps tendon and supraspinatus tendon in R shoulder.   TODAY'S TREATMENT:                                                                                                                                         DATE:  Supine foam roll massage to biceps and anterior and middle deltoids Grade III long axis Glenohumeral distraction PROM into external, internal rotations and flexion  Supine chest press: 5lb weight in each arm: 3 x 10 R and L Manually resisted flexion, extension, horizontal abduction/adduction perturbation with pt supine with arm 90 deg flexion isomerically held: 3 x 1' Supine elbow flexion, chest press and eccentric lowering with elbow extended in flexion: 2 lbs 2 x 10 R only (attempted with 5lb but it was too heavy for pt to manipulate) SL shoulder abduction: 2lbs upto 90 deg: 3 x 10 SL shoulder External rotation: 2lbs 3 x 10 Seated 90 deg flexion: 2lb ball drop and catch: 20x  PATIENT EDUCATION: Education details: see above Person educated: Patient Education method: Explanation Education comprehension: verbalized understanding  HOME EXERCISE PROGRAM: Access Code: [email or web address] URL: [email or web address] Date: 12/07/2022 Prepared by: Lavone Nian  Exercises - Supine Shoulder Press with Dowel  -  2 x daily - 7 x weekly - 2 sets - 10 reps - Supine Shoulder Flexion Extension AAROM with Dowel  - 2 x daily - 7 x weekly - 2 sets - 10 reps - Standing Shoulder Extension with Dowel  - 2 x daily - 7 x weekly - 2 sets - 10 reps - Standing  Shoulder Internal Rotation AAROM with Dowel  - 2 x daily - 7 x weekly - 2 sets - 10 reps - Seated Shoulder Flexion Towel Slide at Table Top  - 2 x daily - 7 x weekly - 2 sets - 10 reps  ASSESSMENT:  CLINICAL IMPRESSION: Pt reported decreased pain at end of the session to <5/10. Overall PROM is full and strength in shoulder is limited by pain currently.  OBJECTIVE IMPAIRMENTS: decreased ROM, decreased strength, hypomobility, increased muscle spasms, impaired flexibility, impaired UE functional use, postural dysfunction, and pain.   ACTIVITY LIMITATIONS: carrying, lifting, sleeping, bathing, toileting, dressing, reach over head, and hygiene/grooming  PARTICIPATION LIMITATIONS: meal prep, cleaning, laundry, driving, shopping, community activity, and occupation  PERSONAL FACTORS: Time since onset of injury/illness/exacerbation are also affecting patient's functional outcome.   REHAB POTENTIAL: Good  CLINICAL DECISION MAKING: Stable/uncomplicated  EVALUATION COMPLEXITY: Low  GOALS: Goals reviewed with patient? Yes  SHORT TERM GOALS: Target date: 01/04/2023    Patient will demo 50% compliance with HEP to self manage his symptoms. Baseline:Issued 12/07/22  Goal status: INITIAL  2.  Pt will report no radiation of pain below R elbow to improve ability to use R UE with daily function. Baseline: pt reports of pain, numbness and tingling going down to his R hand (12/07/22) Goal status: INITIAL   LONG TERM GOALS: Target date: 02/01/2023    Patient will demo WNL of AROM without any pain to improve functional reaching Baseline: WNL AROM but with 10/10 pain Goal status: INITIAL  2.  Patient will demo 5/5 MMT in R shoulder without pain to improve ability to lift and carry. Baseline: 3+/5- guarded by pain Goal status: INITIAL  3.  Patient will demo >15 points improvement in UEFS test to improve overall function. Baseline: 76% UEFS (12/07/22) Goal status: INITIAL  4.  Patient will be  able to sleep through night without waking up from shoulder pain. Baseline: wakes patient up every 1 hour Goal status: INITIAL  5.  Patient will be 100% compliant with HEP to self manage his symptoms. Baseline: Issued on 12/07/22 Goal status: INITIAL    PLAN: PT FREQUENCY: 2x/week  PT DURATION: 8 weeks  PLANNED INTERVENTIONS: Therapeutic exercises, Therapeutic activity, Neuromuscular re-education, Balance training, Gait training, Patient/Family education, Self Care, Joint mobilization, Cryotherapy, Moist heat, Manual therapy, and Re-evaluation  PLAN FOR NEXT SESSION: Review HEP   Ileana Ladd, PT 12/15/2022, 11:32 AM

## 2022-12-15 NOTE — Progress Notes (Signed)
Patient says that both his right shoulder and right hip are in a lot of pain; patient is inquiring about injections for both the shoulder and the hip. He says that he forgot to take his medicine before his visit last week but that his sugar levels have been better the last few days. Patient describes shoulder pain as deep in the shoulder. Patient describes hip pain as deep in the hip. Patient has been doing physical therapy for the shoulder. He says that he was prescribed Hydrocodone last week but has only taken half of one which was after physical therapy today; he says that Naproxen does not help.

## 2022-12-19 ENCOUNTER — Ambulatory Visit: Payer: Medicaid Other

## 2022-12-22 ENCOUNTER — Ambulatory Visit: Payer: Medicaid Other

## 2022-12-22 DIAGNOSIS — M25511 Pain in right shoulder: Secondary | ICD-10-CM

## 2022-12-22 DIAGNOSIS — M6281 Muscle weakness (generalized): Secondary | ICD-10-CM

## 2022-12-22 DIAGNOSIS — M5459 Other low back pain: Secondary | ICD-10-CM

## 2022-12-22 DIAGNOSIS — R262 Difficulty in walking, not elsewhere classified: Secondary | ICD-10-CM

## 2022-12-22 NOTE — Therapy (Signed)
OUTPATIENT PHYSICAL THERAPY UPPER EXTREMITY TREATMENT NOTE   Patient Name: Jordan Sparks MRN: [id number] DOB:1975-09-16, 47 y.o., male Today's Date: 12/22/2022  END OF SESSION:  PT End of Session - 12/22/22 1022     Visit Number 4    Number of Visits 12    Date for PT Re-Evaluation 02/16/23    Authorization Type [employer] Medicaid, no auth required    PT Start Time 1015    PT Stop Time 1100    PT Time Calculation (min) 45 min    Activity Tolerance Patient tolerated treatment well    Behavior During Therapy WFL for tasks assessed/performed               Past Medical History:  Diagnosis Date   Anxiety    Asthma    CHF (congestive heart failure) (HCC)    Coronary artery disease    Depression    Diabetes mellitus type 2 in obese    Hypertension    Manic disorder (HCC)    Obesity    Sleep apnea    Past Surgical History:  Procedure Laterality Date   CARPAL TUNNEL RELEASE Right 12/19/2021   Procedure: RIGHT CARPAL TUNNEL RELEASE;  Surgeon: Eldred Manges, MD;  Location: [facility];  Service: Orthopedics;  Laterality: Right;   Dental procedure     Patient Active Problem List   Diagnosis Date Noted   Trigger thumb, right thumb 04/18/2022   Bipolar 1 disorder, mixed, moderate (HCC) 12/06/2021   Insomnia 12/06/2021   Encounter for long-term (current) use of medications 12/06/2021   Unilateral primary osteoarthritis, right hip 11/30/2021   Carpal tunnel syndrome, right upper limb 11/30/2021   Low back pain 10/11/2021   Peripheral neuropathy 10/11/2021   Pain in left shoulder 08/31/2021   Spinal stenosis of cervical region 06/09/2021   Nonischemic cardiomyopathy (HCC) 06/06/2021   Chest pain 06/01/2021   Chest pain of uncertain etiology 05/31/2021   Obesity, Class III, BMI 40-49.9 (morbid obesity) (HCC) 05/31/2021   Diabetes mellitus (HCC) 05/04/2021   Sleep apnea 05/04/2021   Hypertensive urgency 10/19/2020   GERD (gastroesophageal reflux  disease) 02/09/2018   Hyperglycemia 02/08/2018   Atypical chest pain 02/08/2018   Hypokalemia 02/08/2018   Leukocytosis 02/08/2018   Nausea and vomiting 02/08/2018   Mood disorder (HCC)    Hypertension    Anxiety    Hyperglycemia without ketosis    Manic disorder (HCC)     PCP: Dr. Corine Shelter  REFERRING PROVIDER: Persons, [place], [place]  REFERRING DIAG: M25.511 (ICD-10-CM) - Acute pain of right shoulder  THERAPY DIAG:  Acute pain of right shoulder  Other low back pain  Difficulty in walking, not elsewhere classified  Muscle weakness (generalized)  Rationale for Evaluation and Treatment: Rehabilitation  ONSET DATE: 12/06/2022  SUBJECTIVE:  SUBJECTIVE STATEMENT: Pt reports pain is getting better at 5/10. I am feeling pretty good today. Hand dominance: Right  PERTINENT HISTORY: DM  PAIN:  Are you having pain? Yes: NPRS scale:7/10 Pain location: R shoulder, neck, R arm Pain description: achy, sharp, throbbing, constant Aggravating factors: movement, pain at rest Relieving factors: medication  PRECAUTIONS: None  RED FLAGS: None   WEIGHT BEARING RESTRICTIONS: No  FALLS:  Has patient fallen in last 6 months? No   OCCUPATION: Not employed currently  PATIENT GOALS: Improve shoulder pain    OBJECTIVE:   DIAGNOSTIC FINDINGS:  Mild arthritis per X-ray  PATIENT SURVEYS :  UEFS 76% (eval): UEFS 62.5 % (12/22/22)  COGNITION: Overall cognitive status: Within functional limits for tasks assessed     SENSATION: WFL  POSTURE: Forward head, rounded shoulder  UPPER EXTREMITY ROM: Patient has full AROM but with moderate to severe pain with all planes of the motion.   UPPER EXTREMITY MMT:  MMT Right eval Left eval  Shoulder flexion    Shoulder extension    Shoulder  abduction    Shoulder adduction    Shoulder internal rotation    Shoulder external rotation    Middle trapezius    Lower trapezius    Elbow flexion    Elbow extension    Wrist flexion    Wrist extension    Wrist ulnar deviation    Wrist radial deviation    Wrist pronation    Wrist supination    Grip strength (lbs)    (Blank rows = not tested)  PALPATION:  Difficult to assess as patient is sore grossly in shoulder but most tenderness was palpable over proximal biceps tendon and supraspinatus tendon in R shoulder.   TODAY'S TREATMENT:                                                                                                                                         DATE:  Myofascial release to R subscapularis, R lats and R teres major/minor Deep friction massage to proximal biceps tendon on R PROM of R shoulder into flexion  UBE: Level 7 for 8', changing fwd/bwd directions every 1 minute Supine chest press: full range: 8lbs 3 x 10 R only-emphasis on end range shoulder extension to place maximal stretch on proximal biceps tendon without pain Supine butterfly press: 3lbs 2 x 10 R only Seated scaption raises: 1lbs 3 x 10 (attempted 3lbs but pt demo fatigue after 3-4 reps)  PATIENT EDUCATION: Education details: see above Person educated: Patient Education method: Explanation Education comprehension: verbalized understanding  HOME EXERCISE PROGRAM: Access Code: [email or web address] URL: [email or web address] Date: 12/07/2022 Prepared by: Lavone Nian  Exercises - Supine Shoulder Press with Dowel  - 2 x daily - 7 x weekly - 2 sets - 10 reps - Supine Shoulder Flexion Extension AAROM with Dowel  - 2 x daily - 7 x weekly - 2  sets - 10 reps - Standing Shoulder Extension with Dowel  - 2 x daily - 7 x weekly - 2 sets - 10 reps - Standing Shoulder Internal Rotation AAROM with Dowel  - 2 x daily - 7 x weekly - 2 sets - 10 reps - Seated Shoulder Flexion Towel Slide at Table Top   - 2 x daily - 7 x weekly - 2 sets - 10 reps  ASSESSMENT:  CLINICAL IMPRESSION: Pt has been seen for total of 4 sessions since 12/07/22 for total of 4 sessions. Pt has met all the STG from physical therapy. Pt is making steady progress towards his long term function goals. Currently his R shoulder pain has gotten better from 11/10 to 5/10 as of today. Pt still has significant weakness in R shoulder and will benefit from continued skilled PT to address his strength, ROM, and pain impairments.  OBJECTIVE IMPAIRMENTS: decreased ROM, decreased strength, hypomobility, increased muscle spasms, impaired flexibility, impaired UE functional use, postural dysfunction, and pain.   ACTIVITY LIMITATIONS: carrying, lifting, sleeping, bathing, toileting, dressing, reach over head, and hygiene/grooming  PARTICIPATION LIMITATIONS: meal prep, cleaning, laundry, driving, shopping, community activity, and occupation  PERSONAL FACTORS: Time since onset of injury/illness/exacerbation are also affecting patient's functional outcome.   REHAB POTENTIAL: Good  CLINICAL DECISION MAKING: Stable/uncomplicated  EVALUATION COMPLEXITY: Low  GOALS: Goals reviewed with patient? Yes  SHORT TERM GOALS: Target date: 01/04/2023    Patient will demo 50% compliance with HEP to self manage his symptoms. Baseline:Issued 12/07/22  Goal status: MET  2.  Pt will report no radiation of pain below R elbow to improve ability to use R UE with daily function. Baseline: pt reports of pain, numbness and tingling going down to his R hand (12/07/22) Goal status: MET   LONG TERM GOALS: Target date: 02/01/2023    Patient will demo WNL of AROM without any pain to improve functional reaching Baseline: WNL AROM but with 10/10 pain; still painful towards the last 20% of the range (12/22/22) Goal status: IN PROGRESS  2.  Patient will demo 5/5 MMT in R shoulder without pain to improve ability to lift and carry. Baseline: 3+/5- guarded  by pain Goal status: INITIAL  3.  Patient will demo >15 points improvement in UEFS test to improve overall function. Baseline: 76% UEFS (12/07/22); 62.5% (12/22/22) Goal status: IN PROGRESS  4.  Patient will be able to sleep through night without waking up from shoulder pain. Baseline: wakes patient up every 1 hour; Pt is waking up once a night Goal status: IN PROGRESS  5.  Patient will be 100% compliant with HEP to self manage his symptoms. Baseline: Issued on 12/07/22 Goal status: INITIAL    PLAN: PT FREQUENCY: 1-2x/week  PT DURATION: 8 weeks - 8 more sessions  PLANNED INTERVENTIONS: Therapeutic exercises, Therapeutic activity, Neuromuscular re-education, Balance training, Gait training, Patient/Family education, Self Care, Joint mobilization, Cryotherapy, Moist heat, Manual therapy, and Re-evaluation  PLAN FOR NEXT SESSION: Review HEP   Ileana Ladd, PT 12/22/2022, 10:56 AM

## 2022-12-27 ENCOUNTER — Ambulatory Visit: Payer: Medicaid Other

## 2022-12-29 ENCOUNTER — Ambulatory Visit: Payer: Medicaid Other | Admitting: Physical Therapy

## 2022-12-29 DIAGNOSIS — M25511 Pain in right shoulder: Secondary | ICD-10-CM

## 2022-12-29 DIAGNOSIS — R262 Difficulty in walking, not elsewhere classified: Secondary | ICD-10-CM

## 2022-12-29 DIAGNOSIS — M6281 Muscle weakness (generalized): Secondary | ICD-10-CM

## 2022-12-29 DIAGNOSIS — M5459 Other low back pain: Secondary | ICD-10-CM

## 2022-12-29 NOTE — Therapy (Signed)
OUTPATIENT PHYSICAL THERAPY UPPER EXTREMITY TREATMENT NOTE   Patient Name: Jordan Sparks MRN: [id number] DOB:December 11, 1975, 47 y.o., male Today's Date: 12/29/2022  END OF SESSION:  PT End of Session - 12/29/22 1005     Visit Number 5    Number of Visits 12    Date for PT Re-Evaluation 02/16/23    Authorization Type [employer] Medicaid, no auth required    PT Start Time 1010    PT Stop Time 1055    PT Time Calculation (min) 45 min    Activity Tolerance Patient tolerated treatment well    Behavior During Therapy WFL for tasks assessed/performed              Past Medical History:  Diagnosis Date   Anxiety    Asthma    CHF (congestive heart failure) (HCC)    Coronary artery disease    Depression    Diabetes mellitus type 2 in obese    Hypertension    Manic disorder (HCC)    Obesity    Sleep apnea    Past Surgical History:  Procedure Laterality Date   CARPAL TUNNEL RELEASE Right 12/19/2021   Procedure: RIGHT CARPAL TUNNEL RELEASE;  Surgeon: Eldred Manges, MD;  Location: [facility];  Service: Orthopedics;  Laterality: Right;   Dental procedure     Patient Active Problem List   Diagnosis Date Noted   Trigger thumb, right thumb 04/18/2022   Bipolar 1 disorder, mixed, moderate (HCC) 12/06/2021   Insomnia 12/06/2021   Encounter for long-term (current) use of medications 12/06/2021   Unilateral primary osteoarthritis, right hip 11/30/2021   Carpal tunnel syndrome, right upper limb 11/30/2021   Low back pain 10/11/2021   Peripheral neuropathy 10/11/2021   Pain in left shoulder 08/31/2021   Spinal stenosis of cervical region 06/09/2021   Nonischemic cardiomyopathy (HCC) 06/06/2021   Chest pain 06/01/2021   Chest pain of uncertain etiology 05/31/2021   Obesity, Class III, BMI 40-49.9 (morbid obesity) (HCC) 05/31/2021   Diabetes mellitus (HCC) 05/04/2021   Sleep apnea 05/04/2021   Hypertensive urgency 10/19/2020   GERD (gastroesophageal reflux  disease) 02/09/2018   Hyperglycemia 02/08/2018   Atypical chest pain 02/08/2018   Hypokalemia 02/08/2018   Leukocytosis 02/08/2018   Nausea and vomiting 02/08/2018   Mood disorder (HCC)    Hypertension    Anxiety    Hyperglycemia without ketosis    Manic disorder (HCC)     PCP: Dr. Corine Shelter  REFERRING PROVIDER: Persons, [place], [place]  REFERRING DIAG: M25.511 (ICD-10-CM) - Acute pain of right shoulder  THERAPY DIAG:  No diagnosis found.  Rationale for Evaluation and Treatment: Rehabilitation  ONSET DATE: 12/06/2022  SUBJECTIVE:  SUBJECTIVE STATEMENT: States he's only had 2 days of real pain this week. Felt good after last session. States arm feels better with walking. Currently doing sales.  Hand dominance: Right  PERTINENT HISTORY: DM  PAIN:  Are you having pain? Yes: NPRS scale:7/10 Pain location: R shoulder, neck, R arm Pain description: achy, sharp, throbbing, constant Aggravating factors: movement, pain at rest Relieving factors: medication  PRECAUTIONS: None  RED FLAGS: None   WEIGHT BEARING RESTRICTIONS: No  FALLS:  Has patient fallen in last 6 months? No   OCCUPATION: Not employed currently  PATIENT GOALS: Improve shoulder pain    OBJECTIVE:   DIAGNOSTIC FINDINGS:  Mild arthritis per X-ray  PATIENT SURVEYS :  UEFS 76% (eval): UEFS 62.5 % (12/22/22)  COGNITION: Overall cognitive status: Within functional limits for tasks assessed     SENSATION: WFL  POSTURE: Forward head, rounded shoulder  UPPER EXTREMITY ROM: Patient has full AROM but with moderate to severe pain with all planes of the motion.   UPPER EXTREMITY MMT:  MMT Right eval Left eval  Shoulder flexion    Shoulder extension    Shoulder abduction    Shoulder adduction    Shoulder  internal rotation    Shoulder external rotation    Middle trapezius    Lower trapezius    Elbow flexion    Elbow extension    Wrist flexion    Wrist extension    Wrist ulnar deviation    Wrist radial deviation    Wrist pronation    Wrist supination    Grip strength (lbs)    (Blank rows = not tested)  PALPATION:  Difficult to assess as patient is sore grossly in shoulder but most tenderness was palpable over proximal biceps tendon and supraspinatus tendon in R shoulder.   TODAY'S TREATMENT:                                                                                                                                         DATE:  UBE: Level 6 for 6', changing fwd/bwd directions every 1 minute Manual therapy  STM & TPR R pec major/minor, bicep, lats/teres, infraspinatus  Skilled assessment and palpation for TPDN  Trigger Point Dry-Needling  Treatment instructions: Expect mild to moderate muscle soreness. S/S of pneumothorax if dry needled over a lung field, and to seek immediate medical attention should they occur. Patient verbalized understanding of these instructions and education.  Patient Consent Given: Yes Education handout provided: Yes Muscles treated: Pec major/minor, bicep tendon, lat, infraspinatus tendon Electrical stimulation performed: No Parameters: N/A Treatment response/outcome: Twitch response, decreased muscle tension, reduced pain Doorway pec stretch low, mid, high x30 sec each Lat stretch on wall x30  Standing against wall, shoulder ER red TB 2x10 "W" red TB 2x10 Horizontal shoulder abd red TB 2x10 Row red TB 2x10   PATIENT EDUCATION: Education details: see above Person educated: Patient  Education method: Explanation Education comprehension: verbalized understanding  HOME EXERCISE PROGRAM: Access Code: [email or web address] URL: [email or web address] Date: 12/07/2022 Prepared by: Lavone Nian  Exercises - Supine Shoulder Press with Dowel  - 2  x daily - 7 x weekly - 2 sets - 10 reps - Supine Shoulder Flexion Extension AAROM with Dowel  - 2 x daily - 7 x weekly - 2 sets - 10 reps - Standing Shoulder Extension with Dowel  - 2 x daily - 7 x weekly - 2 sets - 10 reps - Standing Shoulder Internal Rotation AAROM with Dowel  - 2 x daily - 7 x weekly - 2 sets - 10 reps - Seated Shoulder Flexion Towel Slide at Table Top  - 2 x daily - 7 x weekly - 2 sets - 10 reps  ASSESSMENT:  CLINICAL IMPRESSION: Performed trial of TPDN this session. Provided stretches to address pec/bicep and lats. Initiated scapular stabilizing exercises for improved proximal stability. Very weak with shoulder horizontal abd and rows. Pt states he feels great at end of session  OBJECTIVE IMPAIRMENTS: decreased ROM, decreased strength, hypomobility, increased muscle spasms, impaired flexibility, impaired UE functional use, postural dysfunction, and pain.    GOALS: Goals reviewed with patient? Yes  SHORT TERM GOALS: Target date: 01/04/2023    Patient will demo 50% compliance with HEP to self manage his symptoms. Baseline:Issued 12/07/22  Goal status: MET  2.  Pt will report no radiation of pain below R elbow to improve ability to use R UE with daily function. Baseline: pt reports of pain, numbness and tingling going down to his R hand (12/07/22) Goal status: MET   LONG TERM GOALS: Target date: 02/01/2023    Patient will demo WNL of AROM without any pain to improve functional reaching Baseline: WNL AROM but with 10/10 pain; still painful towards the last 20% of the range (12/22/22) Goal status: IN PROGRESS  2.  Patient will demo 5/5 MMT in R shoulder without pain to improve ability to lift and carry. Baseline: 3+/5- guarded by pain Goal status: INITIAL  3.  Patient will demo >15 points improvement in UEFS test to improve overall function. Baseline: 76% UEFS (12/07/22); 62.5% (12/22/22) Goal status: IN PROGRESS  4.  Patient will be able to sleep through  night without waking up from shoulder pain. Baseline: wakes patient up every 1 hour; Pt is waking up once a night Goal status: IN PROGRESS  5.  Patient will be 100% compliant with HEP to self manage his symptoms. Baseline: Issued on 12/07/22 Goal status: INITIAL    PLAN: PT FREQUENCY: 1-2x/week  PT DURATION: 8 weeks - 8 more sessions  PLANNED INTERVENTIONS: Therapeutic exercises, Therapeutic activity, Neuromuscular re-education, Balance training, Gait training, Patient/Family education, Self Care, Joint mobilization, Cryotherapy, Moist heat, Manual therapy, and Re-evaluation  PLAN FOR NEXT SESSION: Review HEP   Vida Nicol April Ma L Ken Bonn, PT 12/29/2022, 10:06 AM

## 2023-01-01 ENCOUNTER — Other Ambulatory Visit: Payer: Self-pay | Admitting: Physician Assistant

## 2023-01-02 ENCOUNTER — Ambulatory Visit: Payer: Medicaid Other

## 2023-01-02 ENCOUNTER — Other Ambulatory Visit (HOSPITAL_COMMUNITY): Payer: Self-pay

## 2023-01-02 ENCOUNTER — Encounter (HOSPITAL_COMMUNITY): Payer: Self-pay

## 2023-01-02 ENCOUNTER — Other Ambulatory Visit: Payer: Self-pay

## 2023-01-05 ENCOUNTER — Other Ambulatory Visit (HOSPITAL_COMMUNITY): Payer: Self-pay

## 2023-01-05 ENCOUNTER — Encounter: Payer: Self-pay | Admitting: Sports Medicine

## 2023-01-05 ENCOUNTER — Ambulatory Visit (INDEPENDENT_AMBULATORY_CARE_PROVIDER_SITE_OTHER): Payer: Medicaid Other | Admitting: Sports Medicine

## 2023-01-05 ENCOUNTER — Ambulatory Visit: Payer: Medicaid Other

## 2023-01-05 DIAGNOSIS — G8929 Other chronic pain: Secondary | ICD-10-CM

## 2023-01-05 DIAGNOSIS — M25511 Pain in right shoulder: Secondary | ICD-10-CM | POA: Diagnosis not present

## 2023-01-05 DIAGNOSIS — E11649 Type 2 diabetes mellitus with hypoglycemia without coma: Secondary | ICD-10-CM

## 2023-01-05 DIAGNOSIS — M1611 Unilateral primary osteoarthritis, right hip: Secondary | ICD-10-CM

## 2023-01-05 DIAGNOSIS — M6281 Muscle weakness (generalized): Secondary | ICD-10-CM

## 2023-01-05 DIAGNOSIS — M12811 Other specific arthropathies, not elsewhere classified, right shoulder: Secondary | ICD-10-CM | POA: Diagnosis not present

## 2023-01-05 MED ORDER — MELOXICAM 15 MG PO TABS
15.0000 mg | ORAL_TABLET | Freq: Every day | ORAL | 0 refills | Status: DC
  Filled 2023-01-05: qty 30, 30d supply, fill #0

## 2023-01-05 NOTE — Progress Notes (Signed)
Jordan Sparks - 47 y.o. male MRN [id number]  Date of birth: December 02, 1975  Office Visit Note: Visit Date: 01/05/2023 PCP: Corine Shelter, MD Referred by: Corine Shelter, MD  Subjective: Chief Complaint  Patient presents with   Right Shoulder - Follow-up, Pain   Right Hip - Follow-up, Pain   HPI: Jordan Sparks is a pleasant 47 y.o. male who presents today for follow-up of chronic right shoulder pain and right hip pain with OA.  Right shoulder - he did notice some relief from the subacromial joint injection a few weeks ago but his pain is still quite severe.  He has been doing formalized physical therapy and this is helping, as well as dry needling but his pain continues and he is still having some weakness of that shoulder compared to the contralateral side.  Right hip - the pain feels about the same.  Does notice it after he has been walking and standing on his feet for long periods of time.  For this and his shoulder pain he will take Norco only for his episodes of really significant pain but he tries to limit this.  He is also on gabapentin 300 mg twice daily for his low back.  He recently had a spinal injection and this did help his hip pain somewhat as well.  Pertinent ROS were reviewed with the patient and found to be negative unless otherwise specified above in HPI.   Assessment & Plan: Visit Diagnoses:  1. Rotator cuff arthropathy of right shoulder   2. Chronic right shoulder pain   3. Unilateral primary osteoarthritis, right hip   4. Type 2 diabetes mellitus with hypoglycemia without coma, without long-term current use of insulin (HCC)    Plan: Impression is chronic right shoulder pain, which his exam points to likely rotator cuff tearing.  He did get some relief from subacromial joint injection and formalized physical therapy but he is still having significant pain as well as weakness with rotator cuff testing.  At this standpoint, I think we need  to obtain an MRI to evaluate the rotator cuff tendons and the intra-articular pathology about the shoulder, this was ordered today.  Will continue his PT and rehab in the meantime.  He does have moderate hip osteoarthritis which she will continue HEP.  We will start him on a short course of meloxicam 15 mg to be taken once daily starting tomorrow.  He may take Tylenol and his muscle relaxer with this but no other NSAIDs.  Advised him to continue his blood pressure medications as well.  He does have some concomitant back issues, his gabapentin 300 mg twice daily is safe to continue and may help.  We could always consider ultrasound-guided intra-articular hip injection at a future time, but his shoulder is the most aggravating right now so we will start with the MRI here and follow-up in 1 week after this returns.  Follow-up: Return for Make appt for 1-week after R-shoulder MRI scan .   Meds & Orders: No orders of the defined types were placed in this encounter.  No orders of the defined types were placed in this encounter.   Procedures: - 1 mL of 30cc Toradol administered into the Right deltoid today      Clinical History: MRI LUMBAR SPINE WITHOUT CONTRAST   TECHNIQUE: Multiplanar, multisequence MR imaging of the lumbar spine was performed. No intravenous contrast was administered.   COMPARISON:  Lumbar spine radiographs 04/14/2021. CTA chest, abdomen, and pelvis  06/18/2020.   FINDINGS: The study is motion degraded, moderately so on the axial sequences.   Segmentation: Standard.   Alignment: Trace retrolisthesis of L4 on L5. Straightening of the normal lumbar lordosis.   Vertebrae: No fracture, suspicious marrow lesion, or significant marrow edema. Mild chronic degenerative endplate changes at L5-S1.   Conus medullaris and cauda equina: Conus extends to the L1-2 level. Conus and cauda equina appear normal.   Paraspinal and other soft tissues: Unremarkable.   Disc levels:    Diffuse congenital narrowing of the lumbar spinal canal due to short pedicles.   T12-L1 and L1-2: Negative.   L2-3: Minimal disc bulging without stenosis.   L3-4: Mild disc bulging without significant stenosis.   L4-5: Mild disc desiccation. Circumferential disc bulging and mild facet hypertrophy result in mild spinal stenosis, mild bilateral lateral recess stenosis, and mild-to-moderate bilateral neural foraminal stenosis.   L5-S1: Disc desiccation. Disc bulging and mild facet hypertrophy result in mild right and moderate left neural foraminal stenosis without spinal stenosis.   IMPRESSION: 1. Motion degraded examination. 2. 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     Electronically Signed   By: Sebastian Ache M.D.   On: 05/16/2021 18:00  He reports that he quit smoking about 20 months ago. His smoking use included cigarettes. He started smoking about 33 years ago. He has a 8 pack-year smoking history. He has never used smokeless tobacco.  Recent Labs    09/07/22 0946  HGBA1C 9.7*    Objective:    Physical Exam  Gen: Well-appearing, in no acute distress; non-toxic CV: Well-perfused. Warm.  Resp: Breathing unlabored on room air; no wheezing. Psych: Fluid speech in conversation; appropriate affect; normal thought process Neuro: Sensation intact throughout. No gross coordination deficits.   Ortho Exam - Right shoulder: + TTP at Codman's point.  There is full active and passive range of motion of the shoulder.  Positive drop arm test, positive empty can test with both pain and weakness.  There is positive resisted ER.  No mechanical blocks to motion.  No redness or swelling.  - Right hip: There is pain with FADIR testing and internal rotation.  No bony TTP or greater trochanter TTP.  Imaging:  R-hip XR 03/30/22: Radiographs  of his right hip were obtained today.  No evidence of  dislocation or subluxation he does have some sclerotic changes in the hip  joint.  No changes since previous x-rays   Narrative & Impression  CLINICAL DATA:  Chronic right shoulder pain.   EXAM: RIGHT SHOULDER - 2+ VIEW   COMPARISON:  No prior right shoulder exams available.   FINDINGS: There is no evidence of fracture or dislocation. Mild acromioclavicular acromioclavicular degenerative change. The glenohumeral joint appears normal. No erosive change or focal bone abnormality. Soft tissues are unremarkable.   IMPRESSION: Mild acromioclavicular degenerative change.     Electronically Signed   By: Narda Rutherford M.D.   On: 12/14/2022 22:20    Past Medical/Family/Surgical/Social History: Medications & Allergies reviewed per EMR, new medications updated. Patient Active Problem List   Diagnosis Date Noted   Trigger thumb, right thumb 04/18/2022   Bipolar 1 disorder, mixed, moderate (HCC) 12/06/2021   Insomnia 12/06/2021   Encounter for long-term (current) use of medications 12/06/2021   Unilateral primary osteoarthritis, right hip 11/30/2021   Carpal tunnel syndrome, right upper limb 11/30/2021  Low back pain 10/11/2021   Peripheral neuropathy 10/11/2021   Pain in left shoulder 08/31/2021   Spinal stenosis of cervical region 06/09/2021   Nonischemic cardiomyopathy (HCC) 06/06/2021   Chest pain 06/01/2021   Chest pain of uncertain etiology 05/31/2021   Obesity, Class III, BMI 40-49.9 (morbid obesity) (HCC) 05/31/2021   Diabetes mellitus (HCC) 05/04/2021   Sleep apnea 05/04/2021   Hypertensive urgency 10/19/2020   GERD (gastroesophageal reflux disease) 02/09/2018   Hyperglycemia 02/08/2018   Atypical chest pain 02/08/2018   Hypokalemia 02/08/2018   Leukocytosis 02/08/2018   Nausea and vomiting 02/08/2018   Mood disorder (HCC)    Hypertension    Anxiety    Hyperglycemia without ketosis    Manic disorder (HCC)     Past Medical History:  Diagnosis Date   Anxiety    Asthma    CHF (congestive heart failure) (HCC)    Coronary artery disease    Depression    Diabetes mellitus type 2 in obese    Hypertension    Manic disorder (HCC)    Obesity    Sleep apnea    Family History  Problem Relation Age of Onset   Seizures Brother    Heart failure Neg Hx    Past Surgical History:  Procedure Laterality Date   CARPAL TUNNEL RELEASE Right 12/19/2021   Procedure: RIGHT CARPAL TUNNEL RELEASE;  Surgeon: Eldred Manges, MD;  Location: [facility];  Service: Orthopedics;  Laterality: Right;   Dental procedure     Social History   Occupational History   Occupation: truck Hospital doctor    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, [employer]  Tobacco Use   Smoking status: Former    Current packs/day: 0.00    Average packs/day: 0.3 packs/day for 32.0 years (8.0 ttl pk-yrs)    Types: Cigarettes    Start date: 04/1989    Quit date: 04/2021    Years since quitting: 1.7   Smokeless tobacco: Never  Vaping Use   Vaping status: Never Used  Substance and Sexual Activity   Alcohol use: Not Currently    Comment: rarely   Drug use: Yes    Types: Marijuana    Comment: routinely   Sexual activity: Not on file

## 2023-01-05 NOTE — Progress Notes (Signed)
Patient says that right shoulder did get some relief from the injection but that it is still very painful. He says that he had physical therapy earlier today and that always makes him feel better, but he would still rate his pain a 7.5/10.   Patient says that his hip pain is the same and that he has been on his feet and walking a lot since his last appointment. He adjusts how he sits due to pain; he does not feel comfortable sitting straight up.  Patient says he has not taken any pain medication. He says if he has to choose between treatment of the shoulder or the hip he prefers the shoulder.

## 2023-01-05 NOTE — Therapy (Signed)
OUTPATIENT PHYSICAL THERAPY UPPER EXTREMITY TREATMENT NOTE   Patient Name: Jordan Sparks MRN: [id number] DOB:December 11, 1975, 47 y.o., male Today's Date: 01/05/2023  END OF SESSION:  PT End of Session - 01/05/23 1010     Visit Number 6    Number of Visits 12    Date for PT Re-Evaluation 02/16/23    Authorization Type [employer] Medicaid, no auth required    PT Start Time 1015    PT Stop Time 1100    PT Time Calculation (min) 45 min    Activity Tolerance Patient tolerated treatment well    Behavior During Therapy WFL for tasks assessed/performed              Past Medical History:  Diagnosis Date   Anxiety    Asthma    CHF (congestive heart failure) (HCC)    Coronary artery disease    Depression    Diabetes mellitus type 2 in obese    Hypertension    Manic disorder (HCC)    Obesity    Sleep apnea    Past Surgical History:  Procedure Laterality Date   CARPAL TUNNEL RELEASE Right 12/19/2021   Procedure: RIGHT CARPAL TUNNEL RELEASE;  Surgeon: Eldred Manges, MD;  Location: [facility];  Service: Orthopedics;  Laterality: Right;   Dental procedure     Patient Active Problem List   Diagnosis Date Noted   Trigger thumb, right thumb 04/18/2022   Bipolar 1 disorder, mixed, moderate (HCC) 12/06/2021   Insomnia 12/06/2021   Encounter for long-term (current) use of medications 12/06/2021   Unilateral primary osteoarthritis, right hip 11/30/2021   Carpal tunnel syndrome, right upper limb 11/30/2021   Low back pain 10/11/2021   Peripheral neuropathy 10/11/2021   Pain in left shoulder 08/31/2021   Spinal stenosis of cervical region 06/09/2021   Nonischemic cardiomyopathy (HCC) 06/06/2021   Chest pain 06/01/2021   Chest pain of uncertain etiology 05/31/2021   Obesity, Class III, BMI 40-49.9 (morbid obesity) (HCC) 05/31/2021   Diabetes mellitus (HCC) 05/04/2021   Sleep apnea 05/04/2021   Hypertensive urgency 10/19/2020   GERD (gastroesophageal reflux  disease) 02/09/2018   Hyperglycemia 02/08/2018   Atypical chest pain 02/08/2018   Hypokalemia 02/08/2018   Leukocytosis 02/08/2018   Nausea and vomiting 02/08/2018   Mood disorder (HCC)    Hypertension    Anxiety    Hyperglycemia without ketosis    Manic disorder (HCC)     PCP: Dr. Corine Shelter  REFERRING PROVIDER: Persons, [place], [place]  REFERRING DIAG: M25.511 (ICD-10-CM) - Acute pain of right shoulder  THERAPY DIAG:  Acute pain of right shoulder  Muscle weakness (generalized)  Rationale for Evaluation and Treatment: Rehabilitation  ONSET DATE: 12/06/2022  SUBJECTIVE:  SUBJECTIVE STATEMENT: States he's only had 2 days of real pain this week. Felt good after last session. States arm feels better with walking. Currently doing sales. Pt reports until last Wednesday, he had minimal to no pain in shoulder. He thinks he was carrying 2 x 2L bottles in backpack Wed/Thu and he might have tweaked his shoulder. Hand dominance: Right  PERTINENT HISTORY: DM  PAIN:  Are you having pain? Yes: NPRS scale:7/10 Pain location: R shoulder, neck, R arm Pain description: achy, sharp, throbbing, constant Aggravating factors: movement, pain at rest Relieving factors: medication  PRECAUTIONS: None  RED FLAGS: None   WEIGHT BEARING RESTRICTIONS: No  FALLS:  Has patient fallen in last 6 months? No   OCCUPATION: Not employed currently  PATIENT GOALS: Improve shoulder pain    OBJECTIVE:   DIAGNOSTIC FINDINGS:  Mild arthritis per X-ray  PATIENT SURVEYS :  UEFS 76% (eval): UEFS 62.5 % (12/22/22)  COGNITION: Overall cognitive status: Within functional limits for tasks assessed     SENSATION: WFL  POSTURE: Forward head, rounded shoulder  UPPER EXTREMITY ROM: Patient has full AROM but  with moderate to severe pain with all planes of the motion.   UPPER EXTREMITY MMT:  MMT Right eval Left eval  Shoulder flexion    Shoulder extension    Shoulder abduction    Shoulder adduction    Shoulder internal rotation    Shoulder external rotation    Middle trapezius    Lower trapezius    Elbow flexion    Elbow extension    Wrist flexion    Wrist extension    Wrist ulnar deviation    Wrist radial deviation    Wrist pronation    Wrist supination    Grip strength (lbs)    (Blank rows = not tested)     TODAY'S TREATMENT:                                                                                                                                         DATE:  Scifit: UE only, level 1 for ' STM and deep friction massage to supraspinatus tendon, infraspinaturs, lats on R shoulder Grade IV long axis traction mobs SL shoulder ER: attempted 3lbs but difficulty to lift it to full ROM with painful; gave patietn 2 lbs but it was still difficult to lift it to full ROM so applied small foam roller under elbow to open up the joint space: pt then able to achieve full ROM with moderate difficulty due to weakness: 3 x 10 total Seated scaption raises: 2lbs, difficulty to lift due to weakness and pain, attempted with 0lbs: 2 x 10 Supine 90 deg flexion hold: multiagnle isometrics and reversals by therapist: 3' total Bent over shoulder extension: 2lbs- too heavy, 0lbs- still unable to get to full extension (only able to lift to neural position): 2 x 10 R only Bent over  shoulder extensions: 2lbs 10x  Shoulder special tests: + Obrien's test + Shoulder apprehension test Pt educated on avoiding painful movements of shoulder and working on pain free exercises with HEP at home.  PATIENT EDUCATION: Education details: see above Person educated: Patient Education method: Explanation Education comprehension: verbalized understanding  HOME EXERCISE PROGRAM: Access Code: [email or web address] URL:  [email or web address] Date: 12/07/2022 Prepared by: Lavone Nian  Exercises - Supine Shoulder Press with Dowel  - 2 x daily - 7 x weekly - 2 sets - 10 reps - Supine Shoulder Flexion Extension AAROM with Dowel  - 2 x daily - 7 x weekly - 2 sets - 10 reps - Standing Shoulder Extension with Dowel  - 2 x daily - 7 x weekly - 2 sets - 10 reps - Standing Shoulder Internal Rotation AAROM with Dowel  - 2 x daily - 7 x weekly - 2 sets - 10 reps - Seated Shoulder Flexion Towel Slide at Table Top  - 2 x daily - 7 x weekly - 2 sets - 10 reps  ASSESSMENT:  CLINICAL IMPRESSION: Pt demo set back today. He continues to have full PROM and apprehension of opain with AROM. He was tender over spuraspinatus tendon > biceps tendon today. He tested + with O'Brien test and shoulder apprehension test-potentially indicating SLAP lesion or cuff tear. Pt will continue to benefit from conservative treatment options for pain management and strengthening to improve shoulder stabilization.  OBJECTIVE IMPAIRMENTS: decreased ROM, decreased strength, hypomobility, increased muscle spasms, impaired flexibility, impaired UE functional use, postural dysfunction, and pain.    GOALS: Goals reviewed with patient? Yes  SHORT TERM GOALS: Target date: 01/04/2023    Patient will demo 50% compliance with HEP to self manage his symptoms. Baseline:Issued 12/07/22  Goal status: MET  2.  Pt will report no radiation of pain below R elbow to improve ability to use R UE with daily function. Baseline: pt reports of pain, numbness and tingling going down to his R hand (12/07/22) Goal status: MET   LONG TERM GOALS: Target date: 02/01/2023    Patient will demo WNL of AROM without any pain to improve functional reaching Baseline: WNL AROM but with 10/10 pain; still painful towards the last 20% of the range (12/22/22) Goal status: IN PROGRESS  2.  Patient will demo 5/5 MMT in R shoulder without pain to improve ability  to lift and carry. Baseline: 3+/5- guarded by pain Goal status: INITIAL  3.  Patient will demo >15 points improvement in UEFS test to improve overall function. Baseline: 76% UEFS (12/07/22); 62.5% (12/22/22) Goal status: IN PROGRESS  4.  Patient will be able to sleep through night without waking up from shoulder pain. Baseline: wakes patient up every 1 hour; Pt is waking up once a night Goal status: IN PROGRESS  5.  Patient will be 100% compliant with HEP to self manage his symptoms. Baseline: Issued on 12/07/22 Goal status: INITIAL    PLAN: PT FREQUENCY: 1-2x/week  PT DURATION: 8 weeks - 8 more sessions  PLANNED INTERVENTIONS: Therapeutic exercises, Therapeutic activity, Neuromuscular re-education, Balance training, Gait training, Patient/Family education, Self Care, Joint mobilization, Cryotherapy, Moist heat, Manual therapy, and Re-evaluation  PLAN FOR NEXT SESSION: Review HEP   Ileana Ladd, PT 01/05/2023, 12:50 PM

## 2023-01-07 ENCOUNTER — Inpatient Hospital Stay
Admission: RE | Admit: 2023-01-07 | Discharge: 2023-01-07 | Discharge status: Home / Self Care | Payer: Medicaid Other | Source: Ambulatory Visit | Attending: Sports Medicine

## 2023-01-07 DIAGNOSIS — M12811 Other specific arthropathies, not elsewhere classified, right shoulder: Secondary | ICD-10-CM

## 2023-01-07 DIAGNOSIS — G8929 Other chronic pain: Secondary | ICD-10-CM

## 2023-01-09 ENCOUNTER — Ambulatory Visit: Payer: Medicaid Other

## 2023-01-10 ENCOUNTER — Other Ambulatory Visit (HOSPITAL_COMMUNITY): Payer: Self-pay

## 2023-01-12 ENCOUNTER — Ambulatory Visit: Payer: Medicaid Other | Attending: Pulmonary Disease

## 2023-01-12 ENCOUNTER — Ambulatory Visit (INDEPENDENT_AMBULATORY_CARE_PROVIDER_SITE_OTHER): Payer: Medicaid Other | Admitting: Sports Medicine

## 2023-01-12 ENCOUNTER — Other Ambulatory Visit (HOSPITAL_COMMUNITY): Payer: Self-pay

## 2023-01-12 ENCOUNTER — Encounter: Payer: Self-pay | Admitting: Sports Medicine

## 2023-01-12 DIAGNOSIS — M25511 Pain in right shoulder: Secondary | ICD-10-CM

## 2023-01-12 DIAGNOSIS — M12811 Other specific arthropathies, not elsewhere classified, right shoulder: Secondary | ICD-10-CM | POA: Diagnosis not present

## 2023-01-12 DIAGNOSIS — M545 Low back pain, unspecified: Secondary | ICD-10-CM

## 2023-01-12 DIAGNOSIS — M6281 Muscle weakness (generalized): Secondary | ICD-10-CM | POA: Diagnosis present

## 2023-01-12 DIAGNOSIS — M5459 Other low back pain: Secondary | ICD-10-CM | POA: Diagnosis present

## 2023-01-12 DIAGNOSIS — M48061 Spinal stenosis, lumbar region without neurogenic claudication: Secondary | ICD-10-CM

## 2023-01-12 DIAGNOSIS — G8929 Other chronic pain: Secondary | ICD-10-CM

## 2023-01-12 NOTE — Therapy (Signed)
OUTPATIENT PHYSICAL THERAPY UPPER EXTREMITY TREATMENT NOTE   Patient Name: Jordan Sparks MRN: [id number] DOB:Mar 05, 1976, 47 y.o., male Today's Date: 01/12/2023  END OF SESSION:  PT End of Session - 01/12/23 1026     Visit Number 7    Number of Visits 12    Date for PT Re-Evaluation 02/16/23    Authorization Type [employer] Medicaid, no auth required    PT Start Time 1015    PT Stop Time 1100    PT Time Calculation (min) 45 min    Activity Tolerance Patient tolerated treatment well    Behavior During Therapy WFL for tasks assessed/performed              Past Medical History:  Diagnosis Date   Anxiety    Asthma    CHF (congestive heart failure) (HCC)    Coronary artery disease    Depression    Diabetes mellitus type 2 in obese    Hypertension    Manic disorder (HCC)    Obesity    Sleep apnea    Past Surgical History:  Procedure Laterality Date   CARPAL TUNNEL RELEASE Right 12/19/2021   Procedure: RIGHT CARPAL TUNNEL RELEASE;  Surgeon: Eldred Manges, MD;  Location: [facility];  Service: Orthopedics;  Laterality: Right;   Dental procedure     Patient Active Problem List   Diagnosis Date Noted   Trigger thumb, right thumb 04/18/2022   Bipolar 1 disorder, mixed, moderate (HCC) 12/06/2021   Insomnia 12/06/2021   Encounter for long-term (current) use of medications 12/06/2021   Unilateral primary osteoarthritis, right hip 11/30/2021   Carpal tunnel syndrome, right upper limb 11/30/2021   Low back pain 10/11/2021   Peripheral neuropathy 10/11/2021   Pain in left shoulder 08/31/2021   Spinal stenosis of cervical region 06/09/2021   Nonischemic cardiomyopathy (HCC) 06/06/2021   Chest pain 06/01/2021   Chest pain of uncertain etiology 05/31/2021   Obesity, Class III, BMI 40-49.9 (morbid obesity) (HCC) 05/31/2021   Diabetes mellitus (HCC) 05/04/2021   Sleep apnea 05/04/2021   Hypertensive urgency 10/19/2020   GERD (gastroesophageal reflux disease)  02/09/2018   Hyperglycemia 02/08/2018   Atypical chest pain 02/08/2018   Hypokalemia 02/08/2018   Leukocytosis 02/08/2018   Nausea and vomiting 02/08/2018   Mood disorder (HCC)    Hypertension    Anxiety    Hyperglycemia without ketosis    Manic disorder (HCC)     PCP: Dr. Corine Shelter  REFERRING PROVIDER: Persons, [place], [place]  REFERRING DIAG: M25.511 (ICD-10-CM) - Acute pain of right shoulder  THERAPY DIAG:  Acute pain of right shoulder  Muscle weakness (generalized)  Rationale for Evaluation and Treatment: Rehabilitation  ONSET DATE: 12/06/2022  SUBJECTIVE:  SUBJECTIVE STATEMENT: Pt reports he went to see MD and got a shot about a week ago. He also had MRI and MRI results are posted now. Pt reports past week has been painful. Shot didn't help with his pain. Hand dominance: Right  PERTINENT HISTORY: DM  PAIN:  Are you having pain? Yes: NPRS scale:6/10 Pain location: R shoulder, neck, R arm Pain description: achy, sharp, throbbing, constant Aggravating factors: movement, pain at rest Relieving factors: medication  PRECAUTIONS: None  RED FLAGS: None   WEIGHT BEARING RESTRICTIONS: No  FALLS:  Has patient fallen in last 6 months? No   OCCUPATION: Not employed currently  PATIENT GOALS: Improve shoulder pain    OBJECTIVE:   DIAGNOSTIC FINDINGS: 01/07/23 MRI of shoulder IMPRESSION: 1. Mild-moderate rotator cuff tendinosis without a well-defined tear. 2. Mild intra-articular biceps tendinosis with trace tenosynovitis. 3. Moderate-severe degenerative changes of the [facility] joint. 4. Mild glenohumeral osteoarthritis  PATIENT SURVEYS :  UEFS 76% (eval): UEFS 62.5 % (12/22/22)  COGNITION: Overall cognitive status: Within functional limits for tasks  assessed     SENSATION: WFL  POSTURE: Forward head, rounded shoulder  UPPER EXTREMITY ROM: Patient has full AROM but with moderate to severe pain with all planes of the motion.   UPPER EXTREMITY MMT:  MMT Right eval Left eval  Shoulder flexion    Shoulder extension    Shoulder abduction    Shoulder adduction    Shoulder internal rotation    Shoulder external rotation    Middle trapezius    Lower trapezius    Elbow flexion    Elbow extension    Wrist flexion    Wrist extension    Wrist ulnar deviation    Wrist radial deviation    Wrist pronation    Wrist supination    Grip strength (lbs)    (Blank rows = not tested)     TODAY'S TREATMENT:                                                                                                                                         DATE:  Scifit: UE only, level 6 for '8' Deep friction massage to proximal biceps tendon and supraspinatus/infraspinatus tendon Supine 90 deg flexion multidirectional isometrics: 2' Seated shoulder press with eccentric lowering into scaption with arm extended: 3lbs 2 x 10 R only Doorway shoulder ER stretch/Pec stretch: OH, 90 deg, and 60 deg: 2 x 30" in each position   PATIENT EDUCATION: Education details: see above Person educated: Patient Education method: Explanation Education comprehension: verbalized understanding  HOME EXERCISE PROGRAM: Access Code: [email or web address] URL: [email or web address] Date: 12/07/2022 Prepared by: Lavone Nian  Exercises - Supine Shoulder Press with Dowel  - 2 x daily - 7 x weekly - 2 sets - 10 reps - Supine Shoulder Flexion Extension AAROM with Dowel  - 2 x daily - 7 x weekly - 2 sets - 10  reps - Standing Shoulder Extension with Dowel  - 2 x daily - 7 x weekly - 2 sets - 10 reps - Standing Shoulder Internal Rotation AAROM with Dowel  - 2 x daily - 7 x weekly - 2 sets - 10 reps - Seated Shoulder Flexion Towel Slide at Table Top  - 2 x daily - 7 x  weekly - 2 sets - 10 reps  Access Code: [email or web address] URL: [email or web address] Date: 01/12/2023 Prepared by: Lavone Nian  Exercises - Doorway Pec Stretch at 60 Elevation  - 1 x daily - 7 x weekly - 2 sets - 30 sec hold - Doorway Pec Stretch at 90 Degrees Abduction  - 1 x daily - 7 x weekly - 2 sets - 30 sec hold - Doorway Pec Stretch at 120 Degrees Abduction  - 1 x daily - 7 x weekly - 2 sets - 30 sec hold - Shoulder extension with resistance - Neutral  - 1 x daily - 7 x weekly - 2 sets - 10 reps - Standing Single Arm Shoulder Scaption with Dumbbell  - 1 x daily - 7 x weekly - 2 sets - 10 reps  Patient Education - Trigger Point Dry Needling  ASSESSMENT:  CLINICAL IMPRESSION: Recent MRI showed no significant tearing in rotator cuff or biceps tendon. It showed tendinosis of rotator cuff and proximal biceps tendon. Updated HEP to emphasize some stretching and eccentric work for rotator cuff.  OBJECTIVE IMPAIRMENTS: decreased ROM, decreased strength, hypomobility, increased muscle spasms, impaired flexibility, impaired UE functional use, postural dysfunction, and pain.    GOALS: Goals reviewed with patient? Yes  SHORT TERM GOALS: Target date: 01/04/2023    Patient will demo 50% compliance with HEP to self manage his symptoms. Baseline:Issued 12/07/22  Goal status: MET  2.  Pt will report no radiation of pain below R elbow to improve ability to use R UE with daily function. Baseline: pt reports of pain, numbness and tingling going down to his R hand (12/07/22) Goal status: MET   LONG TERM GOALS: Target date: 02/01/2023    Patient will demo WNL of AROM without any pain to improve functional reaching Baseline: WNL AROM but with 10/10 pain; still painful towards the last 20% of the range (12/22/22) Goal status: IN PROGRESS  2.  Patient will demo 5/5 MMT in R shoulder without pain to improve ability to lift and carry. Baseline: 3+/5- guarded by pain Goal status:  INITIAL  3.  Patient will demo >15 points improvement in UEFS test to improve overall function. Baseline: 76% UEFS (12/07/22); 62.5% (12/22/22) Goal status: IN PROGRESS  4.  Patient will be able to sleep through night without waking up from shoulder pain. Baseline: wakes patient up every 1 hour; Pt is waking up once a night Goal status: IN PROGRESS  5.  Patient will be 100% compliant with HEP to self manage his symptoms. Baseline: Issued on 12/07/22 Goal status: INITIAL    PLAN: PT FREQUENCY: 1-2x/week  PT DURATION: 8 weeks - 8 more sessions  PLANNED INTERVENTIONS: Therapeutic exercises, Therapeutic activity, Neuromuscular re-education, Balance training, Gait training, Patient/Family education, Self Care, Joint mobilization, Cryotherapy, Moist heat, Manual therapy, and Re-evaluation  PLAN FOR NEXT SESSION: Review HEP   Ileana Ladd, PT 01/12/2023, 11:03 AM

## 2023-01-12 NOTE — Progress Notes (Signed)
Patient says that he has been in a lot of pain this week but that yesterday afternoon it seemed to ease up. He says that today he has had the least amount of pain that he has had in a long time; patient says that he had physical therapy today and took his first dose of Meloxicam around lunch time.

## 2023-01-12 NOTE — Progress Notes (Signed)
Jordan Sparks - 47 y.o. male MRN [id number]  Date of birth: 03-Dec-1975  Office Visit Note: Visit Date: 01/12/2023 PCP: Corine Shelter, MD Referred by: Corine Shelter, MD  Subjective: Chief Complaint  Patient presents with   Right Shoulder - Follow-up   HPI: Jordan Sparks is a pleasant 47 y.o. male who presents today for follow-up of chronic right shoulder pain, also with prior LBP and hip pain. Here for review of shoulder MRI as well.  Right shoulder -over the last few days he has had significant improvement in his shoulder pain.  He is in a good grief with formalized physical therapy.  This has been a first time in a while he has had significant pain reduction.  He did take his first dose of meloxicam 15 mg as well.  Lumbar/Right hip -pretty stable.  Did review MRI of the low back which shows some mild disc bulging as well as some lumbar spinal stenosis, which is likely congenital in nature.  He does continue on gabapentin 300 mg twice daily.  In the past he had a spinal injection that was somewhat helpful.  Pertinent ROS were reviewed with the patient and found to be negative unless otherwise specified above in HPI.   Assessment & Plan: Visit Diagnoses:  1. Rotator cuff arthropathy of right shoulder   2. Chronic right shoulder pain   3. Chronic bilateral low back pain, unspecified whether sciatica present   4. Spinal stenosis of lumbar region without neurogenic claudication    Plan: Both Child and I are pleased with his more recent improvement of his shoulder with formalized physical therapy as well as just starting his meloxicam 15 mg.  We did review MRI today which shows some rotator cuff tendinopathy, most specifically with the insertion of the supraspinatus and some tenosynovitis of the bicep tendon.  He will continue formalized PT as well as meloxicam 15 mg once daily for the next few weeks.  He also may continue his gabapentin 300 mg twice daily for  his low back and stenosis.  I would like to see over the next month or so how he progresses through physical therapy, we can follow-up depending on his degree of improvement.  Did discuss a possible option would be intra-articular GHJ injection which would likely help his biceps tendinitis as well, we will hold on this for now given that he is improving.  Follow-up: Return if symptoms worsen or fail to improve.   Meds & Orders: No orders of the defined types were placed in this encounter.  No orders of the defined types were placed in this encounter.    Procedures: No procedures performed      Clinical History: MRI LUMBAR SPINE WITHOUT CONTRAST   TECHNIQUE: Multiplanar, multisequence MR imaging of the lumbar spine was performed. No intravenous contrast was administered.   COMPARISON:  Lumbar spine radiographs 04/14/2021. CTA chest, abdomen, and pelvis 06/18/2020.   FINDINGS: The study is motion degraded, moderately so on the axial sequences.   Segmentation: Standard.   Alignment: Trace retrolisthesis of L4 on L5. Straightening of the normal lumbar lordosis.   Vertebrae: No fracture, suspicious marrow lesion, or significant marrow edema. Mild chronic degenerative endplate changes at L5-S1.   Conus medullaris and cauda equina: Conus extends to the L1-2 level. Conus and cauda equina appear normal.   Paraspinal and other soft tissues: Unremarkable.   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  T12-L1 and L1-2: Negative.   L2-3: Minimal disc bulging without stenosis.   L3-4: Mild disc bulging without significant stenosis.   L4-5: Mild disc desiccation. Circumferential disc bulging and mild facet hypertrophy result in mild spinal stenosis, mild bilateral lateral recess stenosis, and mild-to-moderate bilateral neural foraminal stenosis.   L5-S1: Disc desiccation. Disc bulging and mild facet hypertrophy result in mild  right and moderate left neural foraminal stenosis without spinal stenosis.   IMPRESSION: 1. Motion degraded examination. 2. 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     Electronically Signed   By: Sebastian Ache M.D.   On: 05/16/2021 18:00  He reports that he quit smoking about 21 months ago. His smoking use included cigarettes. He started smoking about 33 years ago. He has a 8 pack-year smoking history. He has never used smokeless tobacco.  Recent Labs    09/07/22 0946  HGBA1C 9.7*    Objective:    Physical Exam  Gen: Well-appearing, in no acute distress; non-toxic CV: Regular Rate. Well-perfused. Warm.  Resp: Breathing unlabored on room air; no wheezing. Psych: Fluid speech in conversation; appropriate affect; normal thought process Neuro: Sensation intact throughout. No gross coordination deficits.   Ortho Exam - Right shoulder: + TTP at Codman's point as well as over the bicipital groove anteriorly.  There is no effusion redness, no AC joint TTP.  There is some pain with resisted abduction and drop arm testing.  Equivocal biceps speeds test.  No gross restriction with active or passive range of motion.  Imaging:  MR SHOULDER RIGHT WO CONTRAST CLINICAL DATA:  Chronic right shoulder pain and limited range of motion  EXAM: MRI OF THE RIGHT SHOULDER WITHOUT CONTRAST  TECHNIQUE: Multiplanar, multisequence MR imaging of the shoulder was performed. No intravenous contrast was administered.  COMPARISON:  X-ray 12/04/2022  FINDINGS: Rotator cuff: Mild-moderate rotator cuff tendinosis without a well-defined tear.  Muscles: Preserved bulk and signal intensity of the rotator cuff musculature without edema, atrophy, or fatty infiltration.  Biceps long head: Mild intra-articular biceps tendinosis.  Trace tenosynovitis.  Acromioclavicular Joint: Moderate-severe degenerative changes of the [facility] joint with small inferiorly oriented osteophytes. Trace subacromial-subdeltoid bursal fluid.  Glenohumeral Joint: No joint effusion. Mild degenerative chondral thinning.  Labrum: Grossly intact although evaluation is limited in the absence of intra-articular fluid. No paralabral cyst.  Bones: No acute fracture. No dislocation. Reactive subchondral marrow edema at the [facility] joint. No marrow replacing bone lesion.  Other: None.  IMPRESSION: 1. Mild-moderate rotator cuff tendinosis without a well-defined tear. 2. Mild intra-articular biceps tendinosis with trace tenosynovitis. 3. Moderate-severe degenerative changes of the [facility] joint. 4. Mild glenohumeral osteoarthritis.  Electronically Signed   By: Duanne Guess D.O.   On: 01/11/2023 16:30  Past Medical/Family/Surgical/Social History: Medications & Allergies reviewed per EMR, new medications updated. Patient Active Problem List   Diagnosis Date Noted   Trigger thumb, right thumb 04/18/2022   Bipolar 1 disorder, mixed, moderate (HCC) 12/06/2021   Insomnia 12/06/2021   Encounter for long-term (current) use of medications 12/06/2021   Unilateral primary osteoarthritis, right hip 11/30/2021   Carpal tunnel syndrome, right upper limb 11/30/2021   Low back pain 10/11/2021   Peripheral neuropathy 10/11/2021   Pain in left shoulder 08/31/2021   Spinal stenosis of cervical region 06/09/2021   Nonischemic cardiomyopathy (HCC) 06/06/2021   Chest pain 06/01/2021   Chest pain of uncertain  etiology 05/31/2021   Obesity, Class III, BMI 40-49.9 (morbid obesity) (HCC) 05/31/2021   Diabetes mellitus (HCC) 05/04/2021   Sleep apnea 05/04/2021   Hypertensive urgency 10/19/2020   GERD (gastroesophageal reflux disease) 02/09/2018   Hyperglycemia 02/08/2018   Atypical chest pain 02/08/2018   Hypokalemia 02/08/2018   Leukocytosis 02/08/2018    Nausea and vomiting 02/08/2018   Mood disorder (HCC)    Hypertension    Anxiety    Hyperglycemia without ketosis    Manic disorder (HCC)    Past Medical History:  Diagnosis Date   Anxiety    Asthma    CHF (congestive heart failure) (HCC)    Coronary artery disease    Depression    Diabetes mellitus type 2 in obese    Hypertension    Manic disorder (HCC)    Obesity    Sleep apnea    Family History  Problem Relation Age of Onset   Seizures Brother    Heart failure Neg Hx    Past Surgical History:  Procedure Laterality Date   CARPAL TUNNEL RELEASE Right 12/19/2021   Procedure: RIGHT CARPAL TUNNEL RELEASE;  Surgeon: Eldred Manges, MD;  Location: [facility];  Service: Orthopedics;  Laterality: Right;   Dental procedure     Social History   Occupational History   Occupation: truck Hospital doctor    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, [employer]  Tobacco Use   Smoking status: Former    Current packs/day: 0.00    Average packs/day: 0.3 packs/day for 32.0 years (8.0 ttl pk-yrs)    Types: Cigarettes    Start date: 04/1989    Quit date: 04/2021    Years since quitting: 1.7   Smokeless tobacco: Never  Vaping Use   Vaping status: Never Used  Substance and Sexual Activity   Alcohol use: Not Currently    Comment: rarely   Drug use: Yes    Types: Marijuana    Comment: routinely   Sexual activity: Not on file

## 2023-01-16 ENCOUNTER — Ambulatory Visit: Payer: Medicaid Other

## 2023-01-16 ENCOUNTER — Other Ambulatory Visit (HOSPITAL_COMMUNITY): Payer: Self-pay | Admitting: Internal Medicine

## 2023-01-19 ENCOUNTER — Other Ambulatory Visit (HOSPITAL_COMMUNITY): Payer: Self-pay

## 2023-01-19 ENCOUNTER — Ambulatory Visit: Payer: Medicaid Other

## 2023-01-19 DIAGNOSIS — M25511 Pain in right shoulder: Secondary | ICD-10-CM

## 2023-01-19 DIAGNOSIS — M6281 Muscle weakness (generalized): Secondary | ICD-10-CM

## 2023-01-19 DIAGNOSIS — M5459 Other low back pain: Secondary | ICD-10-CM

## 2023-01-19 NOTE — Therapy (Signed)
OUTPATIENT PHYSICAL THERAPY UPPER EXTREMITY TREATMENT NOTE   Patient Name: Jordan Sparks MRN: [id number] DOB:[birth year], 47 y.o., male Today's Date: 01/19/2023  END OF SESSION:  PT End of Session - 01/19/23 1020     Visit Number 8    Number of Visits 12    Date for PT Re-Evaluation 02/16/23    Authorization Type [employer] Medicaid, no auth required    PT Start Time 1015    PT Stop Time 1100    PT Time Calculation (min) 45 min    Activity Tolerance Patient tolerated treatment well    Behavior During Therapy WFL for tasks assessed/performed              Past Medical History:  Diagnosis Date   Anxiety    Asthma    CHF (congestive heart failure) (HCC)    Coronary artery disease    Depression    Diabetes mellitus type 2 in obese    Hypertension    Manic disorder (HCC)    Obesity    Sleep apnea    Past Surgical History:  Procedure Laterality Date   CARPAL TUNNEL RELEASE Right 12/19/2021   Procedure: RIGHT CARPAL TUNNEL RELEASE;  Surgeon: Eldred Manges, MD;  Location: [facility];  Service: Orthopedics;  Laterality: Right;   Dental procedure     Patient Active Problem List   Diagnosis Date Noted   Trigger thumb, right thumb 04/18/2022   Bipolar 1 disorder, mixed, moderate (HCC) 12/06/2021   Insomnia 12/06/2021   Encounter for long-term (current) use of medications 12/06/2021   Unilateral primary osteoarthritis, right hip 11/30/2021   Carpal tunnel syndrome, right upper limb 11/30/2021   Low back pain 10/11/2021   Peripheral neuropathy 10/11/2021   Pain in left shoulder 08/31/2021   Spinal stenosis of cervical region 06/09/2021   Nonischemic cardiomyopathy (HCC) 06/06/2021   Chest pain 06/01/2021   Chest pain of uncertain etiology 05/31/2021   Obesity, Class III, BMI 40-49.9 (morbid obesity) (HCC) 05/31/2021   Diabetes mellitus (HCC) 05/04/2021   Sleep apnea 05/04/2021   Hypertensive urgency 10/19/2020   GERD (gastroesophageal reflux disease)  02/09/2018   Hyperglycemia 02/08/2018   Atypical chest pain 02/08/2018   Hypokalemia 02/08/2018   Leukocytosis 02/08/2018   Nausea and vomiting 02/08/2018   Mood disorder (HCC)    Hypertension    Anxiety    Hyperglycemia without ketosis    Manic disorder (HCC)     PCP: Dr. Corine Shelter  REFERRING PROVIDER: Persons, [place], [place]  REFERRING DIAG: M25.511 (ICD-10-CM) - Acute pain of right shoulder  THERAPY DIAG:  Acute pain of right shoulder  Muscle weakness (generalized)  Other low back pain  Rationale for Evaluation and Treatment: Rehabilitation  ONSET DATE: 12/06/2022  SUBJECTIVE:  SUBJECTIVE STATEMENT: Pt reports pain is getting better in the shoulder. Pain is  Hand dominance: Right  PERTINENT HISTORY: DM  PAIN:  Are you having pain? Yes: NPRS scale:4/10 Pain location: R shoulder, neck, R arm Pain description: achy, sharp, throbbing, constant Aggravating factors: movement, pain at rest Relieving factors: medication  PRECAUTIONS: None  RED FLAGS: None   WEIGHT BEARING RESTRICTIONS: No  FALLS:  Has patient fallen in last 6 months? No   OCCUPATION: Not employed currently  PATIENT GOALS: Improve shoulder pain    OBJECTIVE:   DIAGNOSTIC FINDINGS: 01/07/23 MRI of shoulder IMPRESSION: 1. Mild-moderate rotator cuff tendinosis without a well-defined tear. 2. Mild intra-articular biceps tendinosis with trace tenosynovitis. 3. Moderate-severe degenerative changes of the [facility] joint. 4. Mild glenohumeral osteoarthritis  PATIENT SURVEYS :  UEFS 76% (eval): UEFS 62.5 % (12/22/22)  COGNITION: Overall cognitive status: Within functional limits for tasks assessed     SENSATION: WFL  POSTURE: Forward head, rounded shoulder  UPPER EXTREMITY ROM: Patient has full AROM  but with moderate to severe pain with all planes of the motion.   UPPER EXTREMITY MMT:  MMT Right eval Left eval  Shoulder flexion    Shoulder extension    Shoulder abduction    Shoulder adduction    Shoulder internal rotation    Shoulder external rotation    Middle trapezius    Lower trapezius    Elbow flexion    Elbow extension    Wrist flexion    Wrist extension    Wrist ulnar deviation    Wrist radial deviation    Wrist pronation    Wrist supination    Grip strength (lbs)    (Blank rows = not tested)     TODAY'S TREATMENT:                                                                                                                                         DATE:  UBE: level 6 for 6' switching directions every 1 min Holding 2 lb bar: performing bil OH flexion: 2 x 10 Holding 2 lb bar: horizontally and then rotating 90 deg in both directions: 10x R and L Holding 2 lb bar behind back: shoulder extensions: 10x  Seated bil shoulder ER: yellow sport cord: 2 x 10 Standing unilateral elbow flexion: yellow sport cord: 2 x 10 (attempted with orange but pt unable to complete full motion) Wall ball roll: 4lb medicine ball: bil alternating motions: 2 x 10  Eccentric biceps curls with orange band: PT assisting patient to get to starting position: 10x  Doorway shoulder ER stretch/Pec stretch: OH, 90 deg, and 60 deg: 1 x 30" in each position [facility] joint mobilization with muscle energy technique Grabbing hands behind back and squeezing shoulder blades together: 5 x 5 with 5" holds  Soft tissue massage to R upper trap, levator scapulae insertion   PATIENT EDUCATION: Education  details: see above Person educated: Patient Education method: Explanation Education comprehension: verbalized understanding  HOME EXERCISE PROGRAM: Access Code: [email or web address] URL: [email or web address] Date: 12/07/2022 Prepared by: Lavone Nian  Exercises - Supine Shoulder Press with Dowel  - 2  x daily - 7 x weekly - 2 sets - 10 reps - Supine Shoulder Flexion Extension AAROM with Dowel  - 2 x daily - 7 x weekly - 2 sets - 10 reps - Standing Shoulder Extension with Dowel  - 2 x daily - 7 x weekly - 2 sets - 10 reps - Standing Shoulder Internal Rotation AAROM with Dowel  - 2 x daily - 7 x weekly - 2 sets - 10 reps - Seated Shoulder Flexion Towel Slide at Table Top  - 2 x daily - 7 x weekly - 2 sets - 10 reps  Access Code: [email or web address] URL: [email or web address] Date: 01/12/2023 Prepared by: Lavone Nian  Exercises - Doorway Pec Stretch at 60 Elevation  - 1 x daily - 7 x weekly - 2 sets - 30 sec hold - Doorway Pec Stretch at 90 Degrees Abduction  - 1 x daily - 7 x weekly - 2 sets - 30 sec hold - Doorway Pec Stretch at 120 Degrees Abduction  - 1 x daily - 7 x weekly - 2 sets - 30 sec hold - Shoulder extension with resistance - Neutral  - 1 x daily - 7 x weekly - 2 sets - 10 reps - Standing Single Arm Shoulder Scaption with Dumbbell  - 1 x daily - 7 x weekly - 2 sets - 10 reps  Patient Education - Trigger Point Dry Needling  ASSESSMENT:  CLINICAL IMPRESSION: Currently patient has WNL of OH motion. Most restrcted motion is with internal rotation and extension which limits functional reach behind the back. Pt also demonstrates weakness which is guarded by pain.  OBJECTIVE IMPAIRMENTS: decreased ROM, decreased strength, hypomobility, increased muscle spasms, impaired flexibility, impaired UE functional use, postural dysfunction, and pain.    GOALS: Goals reviewed with patient? Yes  SHORT TERM GOALS: Target date: 01/04/2023    Patient will demo 50% compliance with HEP to self manage his symptoms. Baseline:Issued 12/07/22  Goal status: MET  2.  Pt will report no radiation of pain below R elbow to improve ability to use R UE with daily function. Baseline: pt reports of pain, numbness and tingling going down to his R hand (12/07/22) Goal status: MET   LONG TERM  GOALS: Target date: 02/01/2023    Patient will demo WNL of AROM without any pain to improve functional reaching Baseline: WNL AROM but with 10/10 pain; still painful towards the last 20% of the range (12/22/22) Goal status: IN PROGRESS  2.  Patient will demo 5/5 MMT in R shoulder without pain to improve ability to lift and carry. Baseline: 3+/5- guarded by pain Goal status: INITIAL  3.  Patient will demo >15 points improvement in UEFS test to improve overall function. Baseline: 76% UEFS (12/07/22); 62.5% (12/22/22) Goal status: IN PROGRESS  4.  Patient will be able to sleep through night without waking up from shoulder pain. Baseline: wakes patient up every 1 hour; Pt is waking up once a night Goal status: IN PROGRESS  5.  Patient will be 100% compliant with HEP to self manage his symptoms. Baseline: Issued on 12/07/22 Goal status: INITIAL    PLAN: PT FREQUENCY: 1-2x/week  PT DURATION: 8 weeks - 8 more sessions  PLANNED INTERVENTIONS: Therapeutic exercises, Therapeutic activity, Neuromuscular re-education, Balance  training, Gait training, Patient/Family education, Self Care, Joint mobilization, Cryotherapy, Moist heat, Manual therapy, and Re-evaluation  PLAN FOR NEXT SESSION: Review HEP   Ileana Ladd, PT 01/19/2023, 10:57 AM

## 2023-01-30 ENCOUNTER — Other Ambulatory Visit (HOSPITAL_COMMUNITY): Payer: Self-pay

## 2023-01-30 ENCOUNTER — Ambulatory Visit (HOSPITAL_BASED_OUTPATIENT_CLINIC_OR_DEPARTMENT_OTHER): Payer: Medicaid Other | Admitting: Family Medicine

## 2023-02-05 ENCOUNTER — Ambulatory Visit (HOSPITAL_BASED_OUTPATIENT_CLINIC_OR_DEPARTMENT_OTHER): Payer: Medicaid Other | Admitting: Family Medicine

## 2023-02-05 ENCOUNTER — Other Ambulatory Visit (HOSPITAL_BASED_OUTPATIENT_CLINIC_OR_DEPARTMENT_OTHER): Payer: Self-pay | Admitting: Family Medicine

## 2023-02-05 ENCOUNTER — Encounter (HOSPITAL_BASED_OUTPATIENT_CLINIC_OR_DEPARTMENT_OTHER): Payer: Self-pay | Admitting: Family Medicine

## 2023-02-05 ENCOUNTER — Other Ambulatory Visit (HOSPITAL_COMMUNITY): Payer: Self-pay

## 2023-02-05 ENCOUNTER — Other Ambulatory Visit: Payer: Self-pay

## 2023-02-05 VITALS — BP 171/112 | HR 99 | Temp 98.4°F | Ht 70.0 in | Wt 281.1 lb

## 2023-02-05 DIAGNOSIS — E11649 Type 2 diabetes mellitus with hypoglycemia without coma: Secondary | ICD-10-CM

## 2023-02-05 DIAGNOSIS — I10 Essential (primary) hypertension: Secondary | ICD-10-CM

## 2023-02-05 DIAGNOSIS — J324 Chronic pansinusitis: Secondary | ICD-10-CM

## 2023-02-05 DIAGNOSIS — Z23 Encounter for immunization: Secondary | ICD-10-CM | POA: Diagnosis not present

## 2023-02-05 DIAGNOSIS — F3162 Bipolar disorder, current episode mixed, moderate: Secondary | ICD-10-CM

## 2023-02-05 DIAGNOSIS — Z7984 Long term (current) use of oral hypoglycemic drugs: Secondary | ICD-10-CM

## 2023-02-05 DIAGNOSIS — L602 Onychogryphosis: Secondary | ICD-10-CM

## 2023-02-05 DIAGNOSIS — Z1211 Encounter for screening for malignant neoplasm of colon: Secondary | ICD-10-CM

## 2023-02-05 DIAGNOSIS — J343 Hypertrophy of nasal turbinates: Secondary | ICD-10-CM

## 2023-02-05 MED ORDER — METFORMIN HCL 1000 MG PO TABS
1000.0000 mg | ORAL_TABLET | Freq: Two times a day (BID) | ORAL | 1 refills | Status: DC
  Filled 2023-02-05 – 2023-03-20 (×3): qty 180, 90d supply, fill #0
  Filled 2023-06-16: qty 180, 90d supply, fill #1

## 2023-02-05 MED ORDER — DULOXETINE HCL 60 MG PO CPEP
60.0000 mg | ORAL_CAPSULE | Freq: Two times a day (BID) | ORAL | 2 refills | Status: DC
  Filled 2023-02-05 – 2023-03-20 (×2): qty 60, 30d supply, fill #0

## 2023-02-05 MED ORDER — ARIPIPRAZOLE 5 MG PO TABS
5.0000 mg | ORAL_TABLET | Freq: Every day | ORAL | 2 refills | Status: DC
  Filled 2023-02-05: qty 30, 30d supply, fill #0

## 2023-02-05 MED ORDER — DAPAGLIFLOZIN PROPANEDIOL 5 MG PO TABS
5.0000 mg | ORAL_TABLET | Freq: Every day | ORAL | 1 refills | Status: DC
  Filled 2023-02-05 – 2023-02-19 (×3): qty 30, 30d supply, fill #0

## 2023-02-05 MED ORDER — DIVALPROEX SODIUM 250 MG PO DR TAB
250.0000 mg | DELAYED_RELEASE_TABLET | Freq: Two times a day (BID) | ORAL | 1 refills | Status: DC
  Filled 2023-02-05 – 2023-03-20 (×3): qty 60, 30d supply, fill #0

## 2023-02-05 NOTE — Assessment & Plan Note (Signed)
Blood pressure above goal in office today, he does indicate that he continues with amlodipine as well as Entresto.  Blood sugars have been running higher evidenced by higher hemoglobin A1c and patient being without medications for underlying diabetes.  He has not had recommended follow-up with cardiology.  Does see cardiology at [facility] location Blood pressure slightly improved on recheck.  Discussed that it would be important to control blood sugars.  Also provided patient with contact information for cardiology office that he can contact them and schedule follow-up.  Recommend intermittent monitoring of blood pressure at home, DASH diet.  Will plan for follow-up within the next few weeks for monitoring

## 2023-02-05 NOTE — Progress Notes (Signed)
Procedures performed today:    None.  Independent interpretation of notes and tests performed by another provider:   None.  Brief History, Exam, Impression, and Recommendations:    BP (!) 171/112 (BP Location: Right Arm, Patient Position: Sitting, Cuff Size: Large)   Pulse 99   Temp 98.4 F (36.9 C) (Oral)   Ht 5\' 10"  (1.778 m)   Wt 281 lb 1.6 oz (127.5 kg)   SpO2 99%   BMI 40.33 kg/m   Patient overdue for follow-up, last seen more than 1 year ago.  Type 2 diabetes mellitus with hypoglycemia without coma, without long-term current use of insulin (HCC) Assessment & Plan: As above, overdue for follow-up.  Indicates that he has been without metformin and that [place] had become cost prohibitive through insurance.  Last hemoglobin A1c was checked earlier this year with another provider and was found to be elevated at 9.7. At this time, we will provide refill for metformin.  Additionally, would like to resume SGLT2 inhibitor.  EHR formulary suggest that Marcelline Deist should be covered similarly to [place].  We will look to send prescription for Marcelline Deist to the pharmacy.  Advised on letting us know if cost is prohibitive and we can look into alternative medications to help with controlling blood sugars. Foot exam completed today, documented in chart Nephropathy screening completed today  Orders: -     Microalbumin / creatinine urine ratio -     Ambulatory referral to Podiatry -     Hemoglobin A1c  Special screening for malignant neoplasms, colon -     Ambulatory referral to Gastroenterology  Encounter for immunization -     Flu vaccine trivalent PF, 6mos and older(Flulaval,Afluria,Fluarix,Fluzone)  Nasal turbinate hypertrophy  Chronic pansinusitis Assessment & Plan: Indicates that he continues to have chronic sinus issues.  Has been an ongoing problem for patient for many years.  He is requesting referral to ENT given chronicity of symptoms.  Referral to ENT placed  today   Bipolar 1 disorder, mixed, moderate (HCC) Assessment & Plan: Patient last saw his psychiatrist earlier this year.  He indicates that he did try to schedule follow-up with them, but was told by the office that he needed a referral to schedule appointment.  He is also requesting refill on some of his medications today. Unusual that patient would be requiring referral to schedule appointment with psychiatrist given that he has been following with him previously.  Ultimately, new referral placed today, advised on importance of having regular follow-up with provider.  Can provide refill of medications in meantime to ensure the patient does not get without medications, will need to arrange for close follow-up with his psychiatrist  Orders: -     DULoxetine HCl; Take 1 capsule (60 mg total) by mouth 2 (two) times daily.  Dispense: 60 capsule; Refill: 2 -     Divalproex Sodium; Take 1 tablet (250 mg total) by mouth 2 (two) times daily.  Dispense: 60 tablet; Refill: 1 -     Ambulatory referral to Psychiatry  Thickened nails Assessment & Plan: All nails on bilateral feet with notable thickening, slightly long nails for some toes on each foot as well.  Patient with observed neuropathy/decree sensation related to underlying diabetes.  Given these findings, offered referral to podiatry for further evaluation and monitoring/management related to neuropathy and observed nail changes.  Patient interested in referral, referral placed today  Orders: -     Ambulatory referral to Podiatry  Primary hypertension Assessment &  Plan: Blood pressure above goal in office today, he does indicate that he continues with amlodipine as well as Entresto.  Blood sugars have been running higher evidenced by higher hemoglobin A1c and patient being without medications for underlying diabetes.  He has not had recommended follow-up with cardiology.  Does see cardiology at [facility] location Blood pressure slightly improved  on recheck.  Discussed that it would be important to control blood sugars.  Also provided patient with contact information for cardiology office that he can contact them and schedule follow-up.  Recommend intermittent monitoring of blood pressure at home, DASH diet.  Will plan for follow-up within the next few weeks for monitoring   Other orders -     metFORMIN HCl; Take 1 tablet (1,000 mg total) by mouth 2 (two) times daily.  Dispense: 180 tablet; Refill: 1 -     Dapagliflozin Propanediol; Take 1 tablet (5 mg total) by mouth daily before breakfast.  Dispense: 30 tablet; Refill: 1 -     ARIPiprazole; Take 1 tablet (5 mg total) by mouth daily.  Dispense: 90 tablet; Refill: 2  Return in about 4 weeks (around 03/05/2023) for hypertension, diabetes.  Spent 50 minutes on this patient encounter, including preparation, chart review, face-to-face counseling with patient and coordination of care, and documentation of encounter   ___________________________________________ Emri Sample de [place], MD, ABFM, [facility]

## 2023-02-05 NOTE — Assessment & Plan Note (Signed)
All nails on bilateral feet with notable thickening, slightly long nails for some toes on each foot as well.  Patient with observed neuropathy/decree sensation related to underlying diabetes.  Given these findings, offered referral to podiatry for further evaluation and monitoring/management related to neuropathy and observed nail changes.  Patient interested in referral, referral placed today

## 2023-02-05 NOTE — Assessment & Plan Note (Signed)
Patient last saw his psychiatrist earlier this year.  He indicates that he did try to schedule follow-up with them, but was told by the office that he needed a referral to schedule appointment.  He is also requesting refill on some of his medications today. Unusual that patient would be requiring referral to schedule appointment with psychiatrist given that he has been following with him previously.  Ultimately, new referral placed today, advised on importance of having regular follow-up with provider.  Can provide refill of medications in meantime to ensure the patient does not get without medications, will need to arrange for close follow-up with his psychiatrist

## 2023-02-05 NOTE — Assessment & Plan Note (Signed)
Indicates that he continues to have chronic sinus issues.  Has been an ongoing problem for patient for many years.  He is requesting referral to ENT given chronicity of symptoms.  Referral to ENT placed today

## 2023-02-05 NOTE — Assessment & Plan Note (Signed)
As above, overdue for follow-up.  Indicates that he has been without metformin and that [place] had become cost prohibitive through insurance.  Last hemoglobin A1c was checked earlier this year with another provider and was found to be elevated at 9.7. At this time, we will provide refill for metformin.  Additionally, would like to resume SGLT2 inhibitor.  EHR formulary suggest that Marcelline Deist should be covered similarly to [place].  We will look to send prescription for Marcelline Deist to the pharmacy.  Advised on letting us know if cost is prohibitive and we can look into alternative medications to help with controlling blood sugars. Foot exam completed today, documented in chart Nephropathy screening completed today

## 2023-02-06 ENCOUNTER — Encounter: Payer: Self-pay | Admitting: Sports Medicine

## 2023-02-06 ENCOUNTER — Ambulatory Visit: Payer: Medicaid Other | Admitting: Sports Medicine

## 2023-02-06 ENCOUNTER — Other Ambulatory Visit (HOSPITAL_COMMUNITY): Payer: Self-pay

## 2023-02-06 ENCOUNTER — Other Ambulatory Visit (HOSPITAL_BASED_OUTPATIENT_CLINIC_OR_DEPARTMENT_OTHER): Payer: Self-pay | Admitting: Family Medicine

## 2023-02-06 ENCOUNTER — Other Ambulatory Visit: Payer: Self-pay

## 2023-02-06 ENCOUNTER — Other Ambulatory Visit (HOSPITAL_COMMUNITY): Payer: Self-pay | Admitting: Internal Medicine

## 2023-02-06 DIAGNOSIS — G8929 Other chronic pain: Secondary | ICD-10-CM

## 2023-02-06 DIAGNOSIS — M12811 Other specific arthropathies, not elsewhere classified, right shoulder: Secondary | ICD-10-CM

## 2023-02-06 DIAGNOSIS — M4722 Other spondylosis with radiculopathy, cervical region: Secondary | ICD-10-CM

## 2023-02-06 DIAGNOSIS — R202 Paresthesia of skin: Secondary | ICD-10-CM | POA: Diagnosis not present

## 2023-02-06 DIAGNOSIS — M19011 Primary osteoarthritis, right shoulder: Secondary | ICD-10-CM | POA: Diagnosis not present

## 2023-02-06 DIAGNOSIS — M25511 Pain in right shoulder: Secondary | ICD-10-CM

## 2023-02-06 DIAGNOSIS — M1611 Unilateral primary osteoarthritis, right hip: Secondary | ICD-10-CM

## 2023-02-06 LAB — HEMOGLOBIN A1C
Est. average glucose Bld gHb Est-mCnc: 220 mg/dL
Hgb A1c MFr Bld: 9.3 % — ABNORMAL HIGH (ref 4.8–5.6)

## 2023-02-06 LAB — MICROALBUMIN / CREATININE URINE RATIO
Creatinine, Urine: 65.4 mg/dL
Microalb/Creat Ratio: 217 mg/g{creat} — ABNORMAL HIGH (ref 0–29)
Microalbumin, Urine: 142 ug/mL

## 2023-02-06 MED ORDER — METHYLPREDNISOLONE ACETATE 40 MG/ML IJ SUSP
40.0000 mg | INTRAMUSCULAR | Status: AC | PRN
  Administered 2023-02-06: 40 mg via INTRA_ARTICULAR

## 2023-02-06 MED ORDER — LIDOCAINE HCL 1 % IJ SOLN
2.0000 mL | INTRAMUSCULAR | Status: AC | PRN
  Administered 2023-02-06: 2 mL

## 2023-02-06 MED ORDER — BUPIVACAINE HCL 0.25 % IJ SOLN
2.0000 mL | INTRAMUSCULAR | Status: AC | PRN
  Administered 2023-02-06: 2 mL via INTRA_ARTICULAR

## 2023-02-06 MED ORDER — FLUTICASONE PROPIONATE 50 MCG/ACT NA SUSP
2.0000 | Freq: Every day | NASAL | 1 refills | Status: DC | PRN
  Filled 2023-02-06: qty 16, 30d supply, fill #0

## 2023-02-06 NOTE — Progress Notes (Signed)
Patient says that his right shoulder and his right hip are both bothering him. He says that when he is up and moving they don't bother him too much, but he says that if he is resting he can't get comfortable with the shoulder. He also mentions feeling like his "hip is going out" at times. He mentioned that his physical therapy appointments ended on the 8th, but if possible he is interested in continuing with those. He says he does his exercises every day but the shoulder still feels weak.

## 2023-02-06 NOTE — Progress Notes (Signed)
Jordan Sparks - 47 y.o. male MRN [id number]  Date of birth: May 15, 1975  Office Visit Note: Visit Date: 02/06/2023 PCP: Tommi Rumps [place], Buren Kos, MD Referred by: de [place], Raymond J, MD  Subjective: Chief Complaint  Patient presents with   Right Shoulder - Pain   Right Hip - Pain   HPI: Jordan Sparks is a pleasant 47 y.o. male who presents today for chronic right shoulder and right hip pain.  Right shoulder - did have a subacromial joint injection on 10//24 which gave him some relief of his pain, he has been doing formalized physical therapy which has been helpful. Had a recent MRI which shows some rotator cuff tendinopathy, most specifically with the insertion of the supraspinatus and some tenosynovitis of the bicep tendon.   Also having some neck pain with intermittent tingling down the arm into the hands/fingers.  Pain is more so over the right side of the neck and can radiate into the trapezius/upper shoulder.  His tingling sensation is intermittent but sometimes goes from the neck all the way down to the arm and fingers.  More so in the second third and fourth finger.  Right hip -this had bothered him on and off.  He also has a degree of lumbar stenosis and previous epidural injection did help relieve some of his pain.  He has found relief from intra-articular hip injection which we last performed back in January.  For pain control he is on meloxicam 15 mg once daily and does use gabapentin 300 mg twice daily for his lumbar stenosis-related pain.  He is a type-II diabetic. Last A1c was: Lab Results  Component Value Date   HGBA1C 9.7 (H) 09/07/2022   Pertinent ROS were reviewed with the patient and found to be negative unless otherwise specified above in HPI.   Assessment & Plan: Visit Diagnoses:  1. Chronic right shoulder pain   2. Other spondylosis with radiculopathy, cervical region   3. Tingling of right upper extremity   4. Rotator cuff arthropathy of right  shoulder   5. Primary osteoarthritis, right shoulder   6. Unilateral primary osteoarthritis, right hip    Plan: Jordan Sparks is dealing with chronic right shoulder pain which has both rotator cuff tendinosis as well as some osteoarthritic change.  His pain had gotten better from previous subacromial joint injection months ago as well as formalized physical therapy, but recently has been exacerbated and he feels like the shoulder still is weaker than the other side.  For both diagnostic and hopefully therapeutic purposes, we did proceed with ultrasound-guided glenohumeral joint injection today.  I would like him to see over the next few weeks to what degree this relieves his pain and if it helps his radiating pain down the arm.  If he is still having some tingling sensation and radicular symptoms going down the arm, we could consider further evaluating the neck with possible cervical MRI.  Review of his previous x-rays do show moderate spondylosis of the cervical spine.  He does have osteoarthritis of the right hip as well and did receive benefit from prior hip injection many months ago.  Could consider this in the future going forward, but would limit doing these closer together given his relatively higher A1c and poor diabetic control.  For pain control, he may continue his meloxicam 15 mg once daily and continue his physical therapy at home which he transition from formal PT. F/u in 1 month for re-evaluation.  Follow-up: Return in about 1  month (around 03/08/2023) for For R-shoulder/neck.   Meds & Orders: No orders of the defined types were placed in this encounter.   Orders Placed This Encounter  Procedures   Large Joint Inj   US Guided Needle Placement - No Linked Charges     Procedures: Large Joint Inj: R glenohumeral on 02/06/2023 11:18 AM Indications: pain Details: 22 G 3.5 in needle, ultrasound-guided posterior approach Medications: 2 mL lidocaine 1 %; 2 mL bupivacaine 0.25 %; 40 mg  methylPREDNISolone acetate 40 MG/ML Outcome: tolerated well, no immediate complications  US-guided glenohumeral joint injection, right shoulder After discussion on risks/benefits/indications, informed verbal consent was obtained. A timeout was then performed. The patient was positioned lying lateral recumbent on examination table. The patient's shoulder was prepped with betadine and multiple alcohol swabs and utilizing ultrasound guidance, the patient's glenohumeral joint was identified on ultrasound. Using ultrasound guidance a 22-gauge, 3.5 inch needle with a mixture of 2:2:1 cc's lidocaine:bupivicaine:depomedrol was directed from a lateral to medial direction via in-plane technique into the glenohumeral joint with visualization of appropriate spread of injectate into the joint. Patient tolerated the procedure well without immediate complications.      Procedure, treatment alternatives, risks and benefits explained, specific risks discussed. Consent was given by the patient. Immediately prior to procedure a time out was called to verify the correct patient, procedure, equipment, support staff and site/side marked as required. Patient was prepped and draped in the usual sterile fashion.          Clinical History: MRI LUMBAR SPINE WITHOUT CONTRAST   TECHNIQUE: Multiplanar, multisequence MR imaging of the lumbar spine was performed. No intravenous contrast was administered.   COMPARISON:  Lumbar spine radiographs 04/14/2021. CTA chest, abdomen, and pelvis 06/18/2020.   FINDINGS: The study is motion degraded, moderately so on the axial sequences.   Segmentation: Standard.   Alignment: Trace retrolisthesis of L4 on L5. Straightening of the normal lumbar lordosis.   Vertebrae: No fracture, suspicious marrow lesion, or significant marrow edema. Mild chronic degenerative endplate changes at L5-S1.   Conus medullaris and cauda equina: Conus extends to the L1-2 level. Conus and cauda  equina appear normal.   Paraspinal and other soft tissues: Unremarkable.   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   T12-L1 and L1-2: Negative.   L2-3: Minimal disc bulging without stenosis.   L3-4: Mild disc bulging without significant stenosis.   L4-5: Mild disc desiccation. Circumferential disc bulging and mild facet hypertrophy result in mild spinal stenosis, mild bilateral lateral recess stenosis, and mild-to-moderate bilateral neural foraminal stenosis.   L5-S1: Disc desiccation. Disc bulging and mild facet hypertrophy result in mild right and moderate left neural foraminal stenosis without spinal stenosis.   IMPRESSION: 1. Motion degraded examination. 2. 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     Electronically Signed   By: Sebastian Ache M.D.   On: 05/16/2021 18:00  He reports that he quit smoking about 21 months ago. His smoking use included cigarettes. He started smoking about 33 years ago. He has a 8 pack-year smoking history. He has been exposed to tobacco smoke. He has never used smokeless tobacco.  Recent Labs    09/07/22 0946  HGBA1C 9.7*    Objective:    Physical Exam  Gen: Well-appearing, in no acute distress; non-toxic CV: Well-perfused. Warm.  Resp: Breathing unlabored on room air; no wheezing. Psych: Fluid speech in conversation; appropriate affect; normal thought process Neuro: Sensation intact throughout. No gross coordination deficits.   Ortho Exam - Right shoulder: There is some tenderness around Codman's point as well as the posterior glenohumeral joint.  No effusion noted.  There is full active range of motion but pain with resisted abduction as well as a positive drop arm test.  - Cervical: There is some right-sided paraspinal and trapezius  hypertonicity.  He has mild reproduction of right upper extremity symptoms with Spurling's test, negative on the left.  There is some limitation with endrange extension, full range of motion with flexion.  Imaging:  Narrative & Impression  CLINICAL DATA:  Chronic right shoulder pain.   EXAM: RIGHT SHOULDER - 2+ VIEW   COMPARISON:  No prior right shoulder exams available.   FINDINGS: There is no evidence of fracture or dislocation. Mild acromioclavicular acromioclavicular degenerative change. The glenohumeral joint appears normal. No erosive change or focal bone abnormality. Soft tissues are unremarkable.   IMPRESSION: Mild acromioclavicular degenerative change.     Electronically Signed   By: Narda Rutherford M.D.   On: 12/14/2022 22:20      Narrative & Impression  CLINICAL DATA:  Cervicalgia.  Neck pain   EXAM: CERVICAL SPINE - 2-3 VIEW   COMPARISON:  None Available.   FINDINGS: Mild straightening of normal lordosis. No listhesis. Anterior spurring from C4-C5 through C6-C7 with preservation of disc spaces. No significant facet changes. No evidence of fracture or focal bone abnormality. No prevertebral soft tissue thickening.   IMPRESSION: Spondylosis with anterior spurring from C4-C5 through C6-C7.     Electronically Signed   By: Narda Rutherford M.D.   On: 12/14/2022 22:21    MR SHOULDER RIGHT WO CONTRAST CLINICAL DATA:  Chronic right shoulder pain and limited range of motion  EXAM: MRI OF THE RIGHT SHOULDER WITHOUT CONTRAST  TECHNIQUE: Multiplanar, multisequence MR imaging of the shoulder was performed. No intravenous contrast was administered.  COMPARISON:  X-ray 12/04/2022  FINDINGS: Rotator cuff: Mild-moderate rotator cuff tendinosis without a well-defined tear.  Muscles: Preserved bulk and signal intensity of the rotator cuff musculature without edema, atrophy, or fatty infiltration.  Biceps long head: Mild intra-articular biceps  tendinosis. Trace tenosynovitis.  Acromioclavicular Joint: Moderate-severe degenerative changes of the [facility] joint with small inferiorly oriented osteophytes. Trace subacromial-subdeltoid bursal fluid.  Glenohumeral Joint: No joint effusion. Mild degenerative chondral thinning.  Labrum: Grossly intact although evaluation is limited in the absence of intra-articular fluid. No paralabral cyst.  Bones: No acute fracture. No dislocation. Reactive subchondral marrow edema at the [facility] joint. No marrow replacing bone lesion.  Other: None.  IMPRESSION: 1. Mild-moderate rotator cuff tendinosis without a well-defined tear. 2. Mild intra-articular biceps tendinosis with trace tenosynovitis. 3. Moderate-severe degenerative changes of the [facility] joint. 4. Mild glenohumeral osteoarthritis.  Electronically Signed   By: Duanne Guess D.O.   On: 01/11/2023 16:30    Past Medical/Family/Surgical/Social History: Medications & Allergies reviewed per EMR, new medications updated. Patient Active Problem List   Diagnosis Date Noted   Thickened nails 02/05/2023   Trigger thumb, right thumb 04/18/2022   Bipolar 1 disorder, mixed, moderate (HCC) 12/06/2021   Insomnia 12/06/2021   Encounter for long-term (current) use of medications 12/06/2021   Unilateral primary osteoarthritis, right hip 11/30/2021   Carpal tunnel syndrome, right upper limb 11/30/2021   Congestive heart failure (CHF) (HCC) 11/10/2021   Low back pain 10/11/2021   Peripheral  neuropathy 10/11/2021   Pain in left shoulder 08/31/2021   Spinal stenosis of cervical region 06/09/2021   Nonischemic cardiomyopathy (HCC) 06/06/2021   Chest pain 06/01/2021   Chest pain of uncertain etiology 05/31/2021   Obesity, Class III, BMI 40-49.9 (morbid obesity) (HCC) 05/31/2021   Diabetes mellitus (HCC) 05/04/2021   Sleep apnea 05/04/2021   Hypertensive urgency 10/19/2020   GERD (gastroesophageal reflux disease) 02/09/2018   Hyperglycemia  02/08/2018   Atypical chest pain 02/08/2018   Hypokalemia 02/08/2018   Leukocytosis 02/08/2018   Nausea and vomiting 02/08/2018   Mood disorder (HCC)    Hypertension    Anxiety    Hyperglycemia without ketosis    Manic disorder (HCC)    Anosmia 10/26/2015   Chronic pansinusitis 10/26/2015   Laryngopharyngeal reflux (LPR) 10/26/2015   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   Nasal turbinate hypertrophy 10/26/2015   Smokes 10/26/2015   Snoring 10/26/2015   Past Medical History:  Diagnosis Date   Anxiety    Asthma    CHF (congestive heart failure) (HCC)    Coronary artery disease    Depression    Diabetes mellitus type 2 in obese    Hypertension    Manic disorder (HCC)    Obesity    Sleep apnea    Family History  Problem Relation Age of Onset   Seizures Brother    Heart failure Neg Hx    Past Surgical History:  Procedure Laterality Date   CARPAL TUNNEL RELEASE Right 12/19/2021   Procedure: RIGHT CARPAL TUNNEL RELEASE;  Surgeon: Eldred Manges, MD;  Location: [facility];  Service: Orthopedics;  Laterality: Right;   Dental procedure     Social History   Occupational History   Occupation: truck Hospital doctor    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, [employer]  Tobacco Use   Smoking status: Former    Current packs/day: 0.00    Average packs/day: 0.3 packs/day for 32.0 years (8.0 ttl pk-yrs)    Types: Cigarettes    Start date: 04/1989    Quit date: 04/2021    Years since quitting: 1.8    Passive exposure: Past   Smokeless tobacco: Never  Vaping Use   Vaping status: Never Used  Substance and Sexual Activity   Alcohol use: Not Currently    Comment: rarely   Drug use: Yes    Types: Marijuana    Comment: routinely   Sexual activity: Not on file

## 2023-02-14 ENCOUNTER — Other Ambulatory Visit (HOSPITAL_COMMUNITY): Payer: Self-pay

## 2023-02-15 ENCOUNTER — Other Ambulatory Visit (HOSPITAL_COMMUNITY): Payer: Self-pay

## 2023-02-16 ENCOUNTER — Other Ambulatory Visit (HOSPITAL_COMMUNITY): Payer: Self-pay

## 2023-02-19 ENCOUNTER — Other Ambulatory Visit (HOSPITAL_COMMUNITY): Payer: Self-pay

## 2023-02-19 ENCOUNTER — Other Ambulatory Visit: Payer: Self-pay

## 2023-03-01 ENCOUNTER — Telehealth: Payer: Self-pay

## 2023-03-01 ENCOUNTER — Ambulatory Visit (HOSPITAL_BASED_OUTPATIENT_CLINIC_OR_DEPARTMENT_OTHER): Payer: Medicaid Other | Admitting: Family Medicine

## 2023-03-01 ENCOUNTER — Other Ambulatory Visit (HOSPITAL_COMMUNITY): Payer: Self-pay

## 2023-03-01 VITALS — BP 160/101 | HR 57 | Ht 70.0 in | Wt 269.7 lb

## 2023-03-01 DIAGNOSIS — E1165 Type 2 diabetes mellitus with hyperglycemia: Secondary | ICD-10-CM | POA: Diagnosis not present

## 2023-03-01 DIAGNOSIS — I10 Essential (primary) hypertension: Secondary | ICD-10-CM

## 2023-03-01 DIAGNOSIS — Z7984 Long term (current) use of oral hypoglycemic drugs: Secondary | ICD-10-CM

## 2023-03-01 NOTE — Patient Instructions (Signed)
  Medication Instructions:  Your physician recommends that you continue on your current medications as directed. Please refer to the Current Medication list given to you today. --If you need a refill on any your medications before your next appointment, please call your pharmacy first. If no refills are authorized on file call the office.--    Follow-Up: Your next appointment:   Your physician recommends that you schedule a follow-up appointment in: 1 month follow up  with Dr. de [place]  You will receive a text message or e-mail with a link to a survey about your care and experience with [place] today! We would greatly appreciate your feedback!   Thanks for letting us be apart of your health journey!!  Primary Care and Sports Medicine   Dr. Arlina Robes [place]   We encourage you to activate your patient portal called "MyChart".  Sign up information is provided on this After Visit Summary.  MyChart is used to connect with patients for Virtual Visits (Telemedicine).  Patients are able to view lab/test results, encounter notes, upcoming appointments, etc.  Non-urgent messages can be sent to your provider as well. To learn more about what you can do with MyChart, please visit --  [email or web address].

## 2023-03-01 NOTE — Progress Notes (Signed)
Procedures performed today:    None.  Independent interpretation of notes and tests performed by another provider:   None.  Brief History, Exam, Impression, and Recommendations:    BP (!) 160/101 (BP Location: Right Arm, Patient Position: Sitting, Cuff Size: Normal)   Pulse (!) 57   Ht 5\' 10"  (1.778 m)   Wt 269 lb 11.2 oz (122.3 kg)   SpO2 98%   BMI 38.70 kg/m   Unfortunately, patient has had difficulty with obtaining and regularly taking prescribed medications.  He reports that this is primarily related to financial difficulties.  He is aware of cost of certain medications such as metformin being cheaper at various pharmacies, typically costing $4 for 1 month supply up to about $9 for 42-month supply.  He indicates that this cost is too high.  He reports that given the holiday season, he does not have money to buy both presents for people and his medications.  Unfortunately, it seems that he has chosen to buy presents for others and forego prescribed medications to treat underlying chronic medical conditions.  He also indicates that he generally feels fatigued and unwell and is requesting other medications/treatments to help with controlling his underlying chronic medical issues including blood pressure.  Primary hypertension Assessment & Plan: Blood pressure is elevated in office today, has improved slightly since last appointment.  He has not followed up with cardiology yet, does have appointment scheduled in the coming weeks.  As above, has had difficulty with obtaining and regularly taking prescribed medications.  He reports that he needs a different medication and amlodipine since it is not controlling blood pressure, however he also indicates that he has not been taking his medications regularly including amlodipine and Entresto. It is difficult to ultimately control blood pressure if patient is not obtaining and regularly taking prescribed medications.  Is also difficult to know  what specific changes may need to be made in regards to medication regimen or whether current regimen is suitable if taken regularly.  Discussed with the patient.  Recommend intermittent monitoring of blood pressure at home, DASH diet.  Recommend continuing with arranged follow-up with cardiology.  Orders: -     AMB Referral [facility] Care Management  Type 2 diabetes mellitus with hyperglycemia, without long-term current use of insulin (HCC) Assessment & Plan: Long discussion today related to management of chronic medical conditions including diabetes.  Unfortunately, due to financial issues and decision to utilize financial resources on nonmedical expenditures including presents for others, patient has not been able to take prescribed medications regularly.  We did discuss considerations related to medication management.  Considerations related to blood sugar management include obtaining and regularly taking prescribed oral medications.  Alternative consideration is utilizing insulin for control of blood sugars.  Ultimately, insulin management may be cheaper in order to control blood sugars without necessitating multiple oral medications.  Ultimately cost would be determined based on insurance coverage and co-pay.  Patient indicates that he will consider options and let us know.  Prescription was previously sent to pharmacy for metformin, indicated that this would still be available for him to pick up.  Orders: -     AMB Referral [facility] Care Management  No follow-ups on file.  Referral has also been placed to the [facility] for assistance regarding impact of social determinants of health.  Spent 34 minutes on this patient encounter, including preparation, chart review, face-to-face counseling with patient and coordination of care, and documentation of encounter   ___________________________________________ Marcy Salvo  de [place], MD, ABFM, [facility] Primary Care and Sports Medicine [facility]

## 2023-03-01 NOTE — Assessment & Plan Note (Signed)
Blood pressure is elevated in office today, has improved slightly since last appointment.  He has not followed up with cardiology yet, does have appointment scheduled in the coming weeks.  As above, has had difficulty with obtaining and regularly taking prescribed medications.  He reports that he needs a different medication and amlodipine since it is not controlling blood pressure, however he also indicates that he has not been taking his medications regularly including amlodipine and Entresto. It is difficult to ultimately control blood pressure if patient is not obtaining and regularly taking prescribed medications.  Is also difficult to know what specific changes may need to be made in regards to medication regimen or whether current regimen is suitable if taken regularly.  Discussed with the patient.  Recommend intermittent monitoring of blood pressure at home, DASH diet.  Recommend continuing with arranged follow-up with cardiology.

## 2023-03-01 NOTE — Assessment & Plan Note (Signed)
Long discussion today related to management of chronic medical conditions including diabetes.  Unfortunately, due to financial issues and decision to utilize financial resources on nonmedical expenditures including presents for others, patient has not been able to take prescribed medications regularly.  We did discuss considerations related to medication management.  Considerations related to blood sugar management include obtaining and regularly taking prescribed oral medications.  Alternative consideration is utilizing insulin for control of blood sugars.  Ultimately, insulin management may be cheaper in order to control blood sugars without necessitating multiple oral medications.  Ultimately cost would be determined based on insurance coverage and co-pay.  Patient indicates that he will consider options and let us know.  Prescription was previously sent to pharmacy for metformin, indicated that this would still be available for him to pick up.

## 2023-03-01 NOTE — Progress Notes (Unsigned)
Care Guide Pharmacy Note  03/01/2023 Name: Jordan Sparks MRN: [id number] DOB: [birth year]  Referred By: de [place], Raymond J, MD Reason for referral: Care Coordination (Outreach to schedule with pharm d )   Jordan Sparks is a 47 y.o. year old male who is a primary care patient of de [place], Buren Kos, MD.  Jordan Sparks was referred to the pharmacist for assistance related to: DMII  An unsuccessful telephone outreach was attempted today to contact the patient who was referred to the pharmacy team for assistance with medication assistance. Additional attempts will be made to contact the patient.  Penne Lash , RMA     [facility]  [facility], [facility] Guide  Direct Dial: [phone]  Website: [email or web address]

## 2023-03-02 ENCOUNTER — Other Ambulatory Visit (HOSPITAL_COMMUNITY): Payer: Self-pay

## 2023-03-11 NOTE — Progress Notes (Signed)
 Cardiology Office Note:    Date:  03/20/2023   ID:  Jordan Sparks, DOB 1975/04/04, MRN [id number]  PCP:  de Cuba, Raymond J, MD  Cardiologist:  Soyla DELENA Merck, MD     Referring MD: de Cuba, Raymond J, MD   Chief Complaint: follow up of CHF and hypertension  History of Present Illness:    Jordan Sparks is a 47 y.o. male with a history of mild non-obstructive CAD noted on coronary CTA in 05/2021, non-ischemic cardiomyopathy/ chronic systolic CHF with EF as low as 25-30% in 05/2021 but normalized to 55% on Echo in 08/2021,  uncontrolled hypertension, hyperlipidemia, type 2 diabetes mellitus, obstructive sleep apnea on CPAP, congenital spinal stenosis, anxiety/depression, mood disorder, obesity, polysubstance abuse (including prior cocaine use), and medication non-compliance who is followed by Dr. Merck and presents today for follow up of CHF and hypertension.  Patient was admitted in 05/2021 after presenting with chest pain and shortness of breath. High-sensitivity troponin was minimally elevated and flat in the 20s. Echo showed LVEF of 25-30% with global hypokinesis and moderate LVH as well as normal RV and mild MR. Coronary CTA showed a coronary calcium  score of 24 with only minimal soft plaque in the mid LAD. Cardiomyopathy felt to be due to longstanding uncontrolled BP. Renal artery ultrasound showed 1-59% stenosis of bilateral renal arteries. He was started on GDMT. Unfortunately, medication compliance has been an issue. However, repeat Echo in 08/2021 showed improvement of LVEF to 55% with moderate LVH, grade 2 diastolic dysfunction, and only trivial MR.   He was last seen by me in 11/2021 at which time he was doing well from a cardiac standpoint. He described one episode of chest pain in setting of significant anxiety as well as some palpitations in setting of panic attacks but was otherwise doing well.  He was seen at an Urgent Care in 11/2022 for right sided neck pain  and intermittent left sided chest pain. Pain was felt to be musculoskeletal in nature after lifting a heavy box. He was given a one-time dose steroid dose and Flexeril .   He was recently seen by his PCP on 03/01/2023 at which time BP was elevated at 160/101. However, he had not been taking his medications regularly largely due to financial difficulties. He continue on Amlodipine  10mg  and Entresto  97-103mg  twice daily and encouraged to take as prescribed.  Patient presents today for follow-up. He states he is doing well from a cardiac standpoint other than his BP being uncontrolled.  He had 1 isolated episode of chest pain that lasted a couple hours last month but he thinks this was related to something he ate.  It improved with belching.  He denies any other episodes of chest pain.  No exertional chest pain.  No shortness of breath, orthopnea, PND.  He notes occasional lower extremity swelling mostly around his ankles but nothing significant.  No palpitations, lightheadedness, dizziness, syncope.  His BP is markedly elevated in the office today. Initially 184/110 and then 188/122 on my personal recheck at the end of visit. He states he has a BP machine at home but has not been checking it regularly. He reports taking his Amlodipine  and Entresto  as prescribed. He denies any stroke like symptoms. He has chronic tingling in his right arm and leg but this is not new and is due to prior injuries.   EKGs/Labs/Other Studies Reviewed:    The following studies were reviewed:  Echocardiogram 05/31/2021: Impressions:  1. Left ventricular  ejection fraction, by estimation, is 25 to 30%. The  left ventricle has severely decreased function. The left ventricle  demonstrates global hypokinesis. There is moderate concentric left  ventricular hypertrophy. Indeterminate  diastolic filling due to E-A fusion.   2. Right ventricular systolic function is normal. The right ventricular  size is normal. Tricuspid  regurgitation signal is inadequate for assessing  PA pressure.   3. The mitral valve is grossly normal. Mild mitral valve regurgitation.  No evidence of mitral stenosis.   4. The aortic valve is tricuspid. Aortic valve regurgitation is not  visualized. No aortic stenosis is present.   5. The inferior vena cava is dilated in size with <50% respiratory  variability, suggesting right atrial pressure of 15 mmHg.  _______________   Renal Artery Ultrasound 06/01/2021: Summary:  Renal:  - Right: 1-59% stenosis of the right renal artery. Normal right resistive Index. Normal size right kidney.  - Left:  1-59% stenosis of the left renal artery. Normal left resistive Index. Normal size of left kidney.    Mesenteric:  Areas of limited visceral study include mesenteric arteries and left renal  artery.  _______________   Coronary CTA 06/01/2021: Impressions: 1. Coronary calcium  score of 24. Elevated based on age, race, and gender. 2. Normal coronary origin with left dominance. 3. CAD-RADS 1. Minimal non-obstructive CAD (1-24%). Consider non-atherosclerotic causes of chest pain. Consider preventive therapy and risk factor modification. _______________   Echocardiogram 09/09/2021: Impressions:  1. Left ventricular ejection fraction, by estimation, is 55%. The left  ventricle has normal function. The left ventricle has no regional wall  motion abnormalities. There is moderate concentric left ventricular  hypertrophy. Left ventricular diastolic  parameters are consistent with Grade II diastolic dysfunction  (pseudonormalization).   2. Right ventricular systolic function is normal. The right ventricular  size is normal.   3. Left atrial size was mild to moderately dilated.   4. The mitral valve is normal in structure. Trivial mitral valve  regurgitation. No evidence of mitral stenosis.   5. The aortic valve is normal in structure. Aortic valve regurgitation is  not visualized. No aortic  stenosis is present.   6. Aortic dilatation noted. There is borderline dilatation of the  ascending aorta, measuring 38 mm.   7. The inferior vena cava is normal in size with greater than 50%  respiratory variability, suggesting right atrial pressure of 3 mmHg.   EKG:  EKG ordered today.   EKG Interpretation Date/Time:  Tuesday March 20 2023 12:09:23 EST Ventricular Rate:  90 PR Interval:  188 QRS Duration:  104 QT Interval:  376 QTC Calculation: 459 R Axis:   61  Text Interpretation: Normal sinus rhythm Possible Left atrial enlargement  LVH with repolarization changes When compared with ECG of 25-Nov-2022 10:46, No significant change was found Confirmed by Jadine Patient [phone]) on 03/20/2023 12:51:40 PM    Recent Labs: 09/07/2022: ALT 24; BUN 9; Creatinine, Ser 0.90; Hemoglobin 13.8; Platelets 274; Potassium 3.4; Sodium 138  Recent Lipid Panel    Component Value Date/Time   CHOL 189 05/04/2021 0953   TRIG 106 05/04/2021 0953   HDL 54 05/04/2021 0953   CHOLHDL 3.5 05/04/2021 0953   CHOLHDL 5.2 02/09/2018 0221   VLDL 37 02/09/2018 0221   LDLCALC 116 (H) 05/04/2021 0953    Physical Exam:    Vital Signs: BP (!) 188/122   Pulse (!) 107   Ht 5' 10 (1.778 m)   Wt 269 lb (122 kg)   SpO2 97%  BMI 38.60 kg/m     Wt Readings from Last 3 Encounters:  03/20/23 269 lb (122 kg)  03/01/23 269 lb 11.2 oz (122.3 kg)  02/05/23 281 lb 1.6 oz (127.5 kg)     General: 47 y.o. obese African-American male in no acute distress. HEENT: Normocephalic and atraumatic. Sclera clear.  Neck: Supple. No JVD. Heart: Borderline tachycardic with regular rhythm.  No murmurs, gallops, or rubs.  Lungs: No increased work of breathing. Clear to ausculation bilaterally. No wheezes, rhonchi, or rales.  Abdomen: Soft, non-distended, and non-tender to palpation.  Extremities: Trace lower extremity edema bilaterally.  Skin: Warm and dry. Neuro: No focal deficits. Psych: Normal affect. Responds  appropriately.   Assessment:    1. Non-ischemic cardiomyopathy (HCC)   2. Chronic systolic CHF (congestive heart failure) (HCC)   3. Coronary artery disease involving native coronary artery of native heart without angina pectoris   4. Primary hypertension   5. Hyperlipidemia, unspecified hyperlipidemia type   6. Type 2 diabetes mellitus with complication, without long-term current use of insulin  (HCC)   7. Obstructive sleep apnea   8. Polysubstance abuse (HCC)   9. Financial difficulties     Plan:    Non-Ischemic Cardiomyopathy Chronic Systolic CHF Diagnosed in 05/2021 during admission for acute CHF. Echo at that time showed LVEF of 25-30% with global hypokinesis and moderate LVH as well as normal RV and mild MR. Coronary CTA showed only minimal CAD. Felt to be due to uncontrolled hypertension. Repeat Echo in 08/2021 after being started on GDMT showed LVEF of 55% with no regional wall motion abnormalities, moderate LVH, grade 2 diastolic dysfunction, and only trivial MR. - Euvolemic on exam. Weight down 12 lbs since 01/2023. - Continue Lasix  20mg  twice daily.  - Continue Entresto  97-103mg  twice daily.  - Previously on Coreg , Eplerenone , and Hydralazine  but is no longer on these. Will restart Coreg  12.5mg  twice daily, Eplerenone  25mg  daily, and Hydralazine  25mg  three times daily.   - He is currently prescribed Farxiga  5mg  daily but states his insurance will not cover this so is not currently taking this. Will see if we can provide him patient assistant forms at follow-up visit but would recommended 10mg  daily dosing for his CHF.  - Continue daily weights and sodium/fluid restrictions.  - Will check a BMET today and then again in 1 week to make sure renal function/ potassium are stable with addition of Eplerenone .    Non-Obstructive CAD Coronary CTA in 05/2021 showed a coronary calcium  score of 24 with only minimal soft plaque in the mid LAD. - He describes one isolated episode of chest  pain last month that he thinks was related to something he ate. No other chest pain. No exertional symptoms. Nothing that sounds like angina. - He was previously on Aspirin  81mg  daily but is not longer taking this. Recommended restarting this.  - Continue statin.   Hypertension BP markedly elevated. BP initially 184/110 and then 188/122 on my personal recheck at the end of the visit. He has a history of medication non-adherence due to financial difficulties.  - Continue Entresto  97-103mg  twice daily and Amlodipine  10mg  daily.  - Will restart Coreg , Eplerenone , and Hydralazine  as above. - Asked patient to keep a BP/HR log at home and bring to follow-up visit in 3-4 weeks.  - Advised patient to go home and start new medications ASAP and let us  know if BP remains >180/100. He states he may not be able to pick up medications until Friday given  financial constraints. Will ask our Child Psychotherapist if we can help with this.  - Reviewed signs/ symptoms of a stroke and ED precautions.  - After visit, noticed that patient is taking Meloxicam  15mg  daily. Will advise him to stop this and avoid NSAIDs when we call him with today's labs.    Hyperlipidemia Lipid panel in 04/2021: Total Cholesterol 189, Triglycerides 106, HDL 54, LDL 116. LDL goal <70 given CAD. - Continue Crestor  20mg  daily.  - He is overdue for repeat labs. Did not have time to discuss today. Will discuss at follow-up visit in 3-4 weeks.   Type 2 Diabetes Mellitus Hemoglobin A1c 9.3% in 01/2023. - On Metformin  and Farxiga . - Management per PCP.   Obstructive Sleep Apnea - Continue CPAP.   Polysubstance Abuse Patient continues to smoke cigars but is no longer using cocaine or marijuana. Congratulated him on this and encouraged him to continue to work on complete cessation of tobacco products.   Disposition: Follow up with me  in 3-4 weeks.   Signed, Aline FORBES Door, PA-C  03/20/2023 12:49 PM    [facility]

## 2023-03-12 ENCOUNTER — Ambulatory Visit: Payer: Medicaid Other | Admitting: Sports Medicine

## 2023-03-12 ENCOUNTER — Encounter: Payer: Self-pay | Admitting: Sports Medicine

## 2023-03-12 DIAGNOSIS — M25511 Pain in right shoulder: Secondary | ICD-10-CM

## 2023-03-12 DIAGNOSIS — M4722 Other spondylosis with radiculopathy, cervical region: Secondary | ICD-10-CM | POA: Diagnosis not present

## 2023-03-12 DIAGNOSIS — M1611 Unilateral primary osteoarthritis, right hip: Secondary | ICD-10-CM

## 2023-03-12 DIAGNOSIS — R202 Paresthesia of skin: Secondary | ICD-10-CM

## 2023-03-12 DIAGNOSIS — M542 Cervicalgia: Secondary | ICD-10-CM

## 2023-03-12 DIAGNOSIS — G8929 Other chronic pain: Secondary | ICD-10-CM

## 2023-03-12 NOTE — Progress Notes (Signed)
Patient says that his shoulder is feeling much better. He says that before it was unbearable, but now the pain has improved and it just clicks some. He says he does still have some feeling of weakness and tingling as mentioned in his last appointment. He is still doing his banded exercises.   Patient says that his right hip is still painful. He says that pain is the same as before, but because his shoulder has improved the hip is more bothersome.

## 2023-03-12 NOTE — Progress Notes (Signed)
Jordan Sparks - 47 y.o. male MRN [id number]  Date of birth: 1975-10-14  Office Visit Note: Visit Date: 03/12/2023 PCP: Jordan Sparks, Jordan Kos, MD Referred by: de Sparks, Jordan J, MD  Subjective: Chief Complaint  Patient presents with   Right Shoulder - Pain, Follow-up   Right Hip - Pain, Follow-up   HPI: Jordan Sparks is a pleasant 47 y.o. male who presents today for follow-up of right shoulder/arm pain and right hip pain.  Right shoulder -back on 02/06/2023 we did perform ultrasound-guided right intra-articular shoulder injection.  He states his pain is significantly better in the shoulder.  He has been working on his home rehab to help strengthen it.  He is still continuing with numbness and tingling that starts at the base of the right neck and goes all the way down the right arm into the fingers.  Mostly in the 2-4 digits but occasionally will feel this in the thumb.  Recently restarted his gabapentin 300 mg and this is helping his numbness tingling sensation.  Right hip -this is starting to become painful again.  This is a chronic issue that waxes and wanes, but he has been walking more recently to clear his mind and at the end of walks he will have pain.  Also gets initial pain when he first starts going but this will loosen up.  He has pain deep within the hip joint.  About 1 year ago we did perform an ultrasound-guided hip injection which gave him good relief for a period of time.  Pertinent ROS were reviewed with the patient and found to be negative unless otherwise specified above in HPI.   Assessment & Plan: Visit Diagnoses:  1. Other spondylosis with radiculopathy, cervical region   2. Cervicalgia   3. Chronic right shoulder pain   4. Tingling of right upper extremity   5. Unilateral primary osteoarthritis, right hip    Plan: [name] had excellent relief of his pain and improved function after the intra-articular shoulder injection.  However, he still  continues with numbness and tingling from the neck all the way down into the fingers of the right hand, which is most indicative of cervical radiculopathy.  Given this, we will further evaluate with an MRI of the cervical spine to better evaluate, especially given his known degenerative disc disease on his cervical x-rays.  He will continue his home rehab protocol for his shoulder.  In terms of the right hip -he is interested in repeating ultrasound-guided right intra-articular hip injection, he will schedule this at his leisure.  For his generalized joint pains, he may continue his meloxicam 15 mg daily.  He will return his gabapentin 300 mg, recommended in the evening only to help with his radicular pain--may increase to BID if tolerating.  Follow-up: R-hip injection and will f/u 1-week after cervical MRI  Meds & Orders: No orders of the defined types were placed in this encounter.   Orders Placed This Encounter  Procedures   MR Cervical Spine w/o contrast     Procedures: No procedures performed      Clinical History: MRI LUMBAR SPINE WITHOUT CONTRAST   TECHNIQUE: Multiplanar, multisequence MR imaging of the lumbar spine was performed. No intravenous contrast was administered.   COMPARISON:  Lumbar spine radiographs 04/14/2021. CTA chest, abdomen, and pelvis 06/18/2020.   FINDINGS: The study is motion degraded, moderately so on the axial sequences.   Segmentation: Standard.   Alignment: Trace retrolisthesis of L4 on L5. Straightening of  the normal lumbar lordosis.   Vertebrae: No fracture, suspicious marrow lesion, or significant marrow edema. Mild chronic degenerative endplate changes at L5-S1.   Conus medullaris and cauda equina: Conus extends to the L1-2 level. Conus and cauda equina appear normal.   Paraspinal and other soft tissues: Unremarkable.   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   T12-L1 and L1-2:  Negative.   L2-3: Minimal disc bulging without stenosis.   L3-4: Mild disc bulging without significant stenosis.   L4-5: Mild disc desiccation. Circumferential disc bulging and mild facet hypertrophy result in mild spinal stenosis, mild bilateral lateral recess stenosis, and mild-to-moderate bilateral neural foraminal stenosis.   L5-S1: Disc desiccation. Disc bulging and mild facet hypertrophy result in mild right and moderate left neural foraminal stenosis without spinal stenosis.   IMPRESSION: 1. Motion degraded examination. 2. 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     Electronically Signed   By: Sebastian Ache M.D.   On: 05/16/2021 18:00  He reports that he quit smoking about 22 months ago. His smoking use included cigarettes. He started smoking about 33 years ago. He has a 8 pack-year smoking history. He has been exposed to tobacco smoke. He has never used smokeless tobacco.  Recent Labs    09/07/22 0946 02/05/23 1404  HGBA1C 9.7* 9.3*    Objective:    Physical Exam  Gen: Well-appearing, in no acute distress; non-toxic CV: Well-perfused. Warm.  Resp: Breathing unlabored on room air; no wheezing. Psych: Fluid speech in conversation; appropriate affect; normal thought process  Ortho Exam - Neck/Shoulder: No bony tenderness about the shoulder.  There is preserved active and passive range of motion.  Mild pain with O'Brien's testing.  Positive Spurling's test on the right, negative Spurling's test on the left.  - Right hip: No greater trochanter TTP.  There is pain with Stinchfield as well as positive FADIR test.  Imaging: No results found.  Past Medical/Family/Surgical/Social History: Medications & Allergies reviewed per EMR, new medications updated. Patient Active Problem List   Diagnosis Date Noted   Thickened nails  02/05/2023   Trigger thumb, right thumb 04/18/2022   Bipolar 1 disorder, mixed, moderate (HCC) 12/06/2021   Insomnia 12/06/2021   Encounter for long-term (current) use of medications 12/06/2021   Unilateral primary osteoarthritis, right hip 11/30/2021   Carpal tunnel syndrome, right upper limb 11/30/2021   Congestive heart failure (CHF) (HCC) 11/10/2021   Low back pain 10/11/2021   Peripheral neuropathy 10/11/2021   Pain in left shoulder 08/31/2021   Spinal stenosis of cervical region 06/09/2021   Nonischemic cardiomyopathy (HCC) 06/06/2021   Chest pain 06/01/2021   Chest pain of uncertain etiology 05/31/2021   Obesity, Class III, BMI 40-49.9 (morbid obesity) (HCC) 05/31/2021   Diabetes mellitus (HCC) 05/04/2021   Sleep apnea 05/04/2021   Hypertensive urgency 10/19/2020   GERD (gastroesophageal reflux disease) 02/09/2018   Hyperglycemia 02/08/2018   Atypical chest pain 02/08/2018   Hypokalemia 02/08/2018   Leukocytosis 02/08/2018   Nausea and vomiting 02/08/2018   Mood disorder (HCC)    Hypertension    Anxiety    Hyperglycemia without ketosis    Manic disorder (HCC)    Anosmia 10/26/2015   Chronic pansinusitis 10/26/2015   Laryngopharyngeal reflux (LPR) 10/26/2015   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   Nasal turbinate hypertrophy 10/26/2015   Smokes  10/26/2015   Snoring 10/26/2015   Past Medical History:  Diagnosis Date   Anxiety    Asthma    CHF (congestive heart failure) (HCC)    Coronary artery disease    Depression    Diabetes mellitus type 2 in obese    Hypertension    Manic disorder (HCC)    Obesity    Sleep apnea    Family History  Problem Relation Age of Onset   Seizures Brother    Heart failure Neg Hx    Past Surgical History:  Procedure Laterality Date   CARPAL TUNNEL RELEASE Right 12/19/2021   Procedure: RIGHT CARPAL TUNNEL RELEASE;  Surgeon: Eldred Manges, MD;  Location: [facility];  Service: Orthopedics;  Laterality: Right;   Dental  procedure     Social History   Occupational History   Occupation: truck Hospital doctor    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, [employer]  Tobacco Use   Smoking status: Former    Current packs/day: 0.00    Average packs/day: 0.3 packs/day for 32.0 years (8.0 ttl pk-yrs)    Types: Cigarettes    Start date: 04/1989    Quit date: 04/2021    Years since quitting: 1.9    Passive exposure: Past   Smokeless tobacco: Never  Vaping Use   Vaping status: Never Used  Substance and Sexual Activity   Alcohol use: Not Currently    Comment: rarely   Drug use: Yes    Types: Marijuana    Comment: routinely   Sexual activity: Not on file

## 2023-03-16 ENCOUNTER — Encounter (HOSPITAL_COMMUNITY): Payer: Self-pay

## 2023-03-20 ENCOUNTER — Encounter: Payer: Self-pay | Admitting: Student

## 2023-03-20 ENCOUNTER — Ambulatory Visit (HOSPITAL_COMMUNITY): Payer: Medicaid Other | Admitting: Licensed Clinical Social Worker

## 2023-03-20 ENCOUNTER — Telehealth: Payer: Self-pay | Admitting: Licensed Clinical Social Worker

## 2023-03-20 ENCOUNTER — Encounter (HOSPITAL_COMMUNITY): Payer: Self-pay

## 2023-03-20 ENCOUNTER — Ambulatory Visit: Payer: Medicaid Other | Attending: Student | Admitting: Student

## 2023-03-20 ENCOUNTER — Other Ambulatory Visit: Payer: Self-pay

## 2023-03-20 ENCOUNTER — Other Ambulatory Visit (HOSPITAL_COMMUNITY): Payer: Self-pay

## 2023-03-20 VITALS — BP 188/122 | HR 107 | Ht 70.0 in | Wt 269.0 lb

## 2023-03-20 DIAGNOSIS — I10 Essential (primary) hypertension: Secondary | ICD-10-CM

## 2023-03-20 DIAGNOSIS — F1910 Other psychoactive substance abuse, uncomplicated: Secondary | ICD-10-CM

## 2023-03-20 DIAGNOSIS — I5022 Chronic systolic (congestive) heart failure: Secondary | ICD-10-CM

## 2023-03-20 DIAGNOSIS — I428 Other cardiomyopathies: Secondary | ICD-10-CM

## 2023-03-20 DIAGNOSIS — E785 Hyperlipidemia, unspecified: Secondary | ICD-10-CM

## 2023-03-20 DIAGNOSIS — I2510 Atherosclerotic heart disease of native coronary artery without angina pectoris: Secondary | ICD-10-CM

## 2023-03-20 DIAGNOSIS — Z599 Problem related to housing and economic circumstances, unspecified: Secondary | ICD-10-CM

## 2023-03-20 DIAGNOSIS — G4733 Obstructive sleep apnea (adult) (pediatric): Secondary | ICD-10-CM

## 2023-03-20 DIAGNOSIS — E118 Type 2 diabetes mellitus with unspecified complications: Secondary | ICD-10-CM

## 2023-03-20 MED ORDER — ASPIRIN 81 MG PO TBEC: 81.0000 mg | DELAYED_RELEASE_TABLET | Freq: Every day | ORAL | Status: DC

## 2023-03-20 MED ORDER — EPLERENONE 25 MG PO TABS
25.0000 mg | ORAL_TABLET | Freq: Every day | ORAL | 11 refills | Status: DC
  Filled 2023-03-20 (×2): qty 30, 30d supply, fill #0
  Filled 2023-04-18: qty 30, 30d supply, fill #1

## 2023-03-20 MED ORDER — HYDRALAZINE HCL 25 MG PO TABS
25.0000 mg | ORAL_TABLET | Freq: Three times a day (TID) | ORAL | 3 refills | Status: DC
  Filled 2023-03-20: qty 270, 90d supply, fill #0
  Filled 2023-06-16: qty 270, 90d supply, fill #1
  Filled 2023-09-18: qty 270, 90d supply, fill #2
  Filled 2023-12-13: qty 270, 90d supply, fill #3

## 2023-03-20 MED ORDER — CARVEDILOL 12.5 MG PO TABS
12.5000 mg | ORAL_TABLET | Freq: Two times a day (BID) | ORAL | 3 refills | Status: DC
  Filled 2023-03-20: qty 180, 90d supply, fill #0

## 2023-03-20 NOTE — Progress Notes (Signed)
 Heart and Vascular Care Navigation  03/20/2023  Jordan Sparks 02/10/1976 [id number]  Reason for Referral: financial challenges Patient is participating in a Managed Medicaid Plan:Yes  Engaged with patient by telephone for initial visit for Heart and Vascular Care Coordination.                                                                                                   Assessment:                     LCSW able to reach pt via telephone today after appt at [phone]. Phone first answered by pt spouse Jordan Sparks then given to pt. Introduced self, role, reason for call. Encouraged pt to complete DPR if in future pt would like us  to speak with wife. Confirmed home address, PCP, and Medicaid coverage. Pt expresses challenges with finances mostly around food and medications. He receives SNAP benefits but is interested in additional information about pantries in the area. We discussed other benefits such as transportation through his insurance. We also discussed utilizing one [facility] to prevent confusion/lapses in picking up medications. Pt educated on our pill packing services and free home delivery. We also discussed home medications should not be skipped if possible, if having difficulties to first speak with pharmacy as they may be able to add his medications to a charge account since he has Medicaid. If they are unable to assist then we may have to find alternate pharmacy such as [facility] that honors copay charge account waiver. Pt will call [facility] pharmacy to see next steps. LCSW also will route this note to RxPrior Auth team as pt has been prescribed Farxiga  but it isnt a preferred Medicaid medication per pt and prior notes.   No additional questions for this writer at this time. Will f/u to ensure pt has received food resources and picked up medications.                   HRT/VAS Care Coordination     Patients Home Cardiology Office [facility]   Outpatient Care Team Social  Worker   Social Worker Name: Marit Lark, [place], [phone]   Living arrangements for the past 2 months Single Family Home   Lives with: Spouse   Patient Current Insurance Coverage Medicaid   Patient Has Concern With Paying Medical Bills No   Does Patient Have Prescription Coverage? Yes   Patient Prescription Assistance Programs Other   Other Assistance Programs Medications discussed charge accounts/waivers for Medicaid patients, referred to Prior Auth team for Farxiga    Home Assistive Devices/Equipment None       Social History:                                                                             SDOH  Screenings   Food Insecurity: Food Insecurity Present (03/20/2023)  Housing: High Risk (03/20/2023)  Transportation Needs: No Transportation Needs (03/20/2023)  Recent Concern: Transportation Needs - Unmet Transportation Needs (03/01/2023)  Utilities: Not At Risk (03/20/2023)  Alcohol Screen: Low Risk  (03/01/2023)  Depression (PHQ2-9): Low Risk  (03/01/2023)  Financial Resource Strain: High Risk (03/20/2023)  Physical Activity: Insufficiently Active (03/01/2023)  Social Connections: Socially Isolated (03/01/2023)  Stress: Stress Concern Present (03/01/2023)  Tobacco Use: High Risk (03/20/2023)  Health Literacy: Adequate Health Literacy (03/20/2023)    SDOH Interventions: Financial Resources:  Surveyor, Quantity Strain Interventions: [employer] Provided (discussed hydrographic surveyor for medications/charge accounts; will send food and utility assistance resources) DSS for financial assistance  Food Insecurity:  Food Insecurity Interventions: [employer] Provided (confirmed pt also recieves [employer])  Housing Insecurity:  Housing Interventions: Scientist, Physiological:   Transportation Interventions: Patient Resources (Friends/Family), Associate Professor    Other Care Navigation Interventions:     Provided Pharmacy assistance resources Other- [employer]  Provided (discussed hydrographic surveyor for medications/charge accounts)  Patient expressed Mental Health concerns No.- note though hx of mental health dx and notes from providers for counseling   Follow-up plan:   LCSW mailed pt the following: my card, Food resources for [facility] and I have encouraged pt to f/u with [employer] about charge account and if not available could use Corporate [employer] account. Pt plans on calling. Will f/u to make sure pt has resources, encouraged him to go to DSS to apply for LIEAP.

## 2023-03-20 NOTE — Patient Instructions (Signed)
 Medication Instructions:  START: CARVEDILOL  12.5 MG TWICE A DAY EPLERENONE  25 MG DAILY HYDRALAZINE  25 MG THREE TIMES A DAY ASPIRIN  81 MG DAILY (OTC)  *If you need a refill on your cardiac medications before your next appointment, please call your pharmacy*   Lab Work: BMP If you have labs (blood work) drawn today and your tests are completely normal, you will receive your results only by: MyChart Message (if you have MyChart) OR A paper copy in the mail If you have any lab test that is abnormal or we need to change your treatment, we will call you to review the results.   Testing/Procedures: KEEP A BP/HR LOG AND BRING TO YOUR NEXT APPOINTMENT.   IF YOUR BP IS 180/100 OR HIGHER 2-3 DAYS AFTER STARTING YOUR MEDICATIONS, PLEASE NOTIFY THE OFFICE- CALL OR MYCHART MESSAGE  Stroke Prevention Some medical conditions and lifestyle choices can lead to a higher risk for a stroke. You can help to prevent a stroke by eating healthy foods and exercising. It also helps to not smoke and to manage any health problems you may have. How can this condition affect me? A stroke is an emergency. It should be treated right away. A stroke can lead to brain damage or threaten your life. There is a better chance of surviving and getting better after a stroke if you get medical help right away. What can increase my risk? The following medical conditions may increase your risk of a stroke: Diseases of the heart and blood vessels (cardiovascular disease). High blood pressure (hypertension). Diabetes. High cholesterol. Sickle cell disease. Problems with blood clotting. Being very overweight. Sleeping problems (obstructivesleep apnea). Other risk factors include: Being older than age 16. A history of blood clots, stroke, or mini-stroke (TIA). Race, ethnic background, or a family history of stroke. Smoking or using tobacco products. Taking birth control pills, especially if you smoke. Heavy alcohol and  drug use. Not being active. What actions can I take to prevent this? Manage your health conditions High cholesterol. Eat a healthy diet. If this is not enough to manage your cholesterol, you may need to take medicines. Take medicines as told by your doctor. High blood pressure. Try to keep your blood pressure below 130/80. If your blood pressure cannot be managed through a healthy diet and regular exercise, you may need to take medicines. Take medicines as told by your doctor. Ask your doctor if you should check your blood pressure at home. Have your blood pressure checked every year. Diabetes. Eat a healthy diet and get regular exercise. If your blood sugar (glucose) cannot be managed through diet and exercise, you may need to take medicines. Take medicines as told by your doctor. Talk to your doctor about getting checked for sleeping problems. Signs of a problem can include: Snoring a lot. Feeling very tired. Make sure that you manage any other conditions you have. Nutrition  Follow instructions from your doctor about what to eat or drink. You may be told to: Eat and drink fewer calories each day. Limit how much salt (sodium) you use to 1,500 milligrams (mg) each day. Use only healthy fats for cooking, such as olive oil, canola oil, and sunflower oil. Eat healthy foods. To do this: Choose foods that are high in fiber. These include whole grains, and fresh fruits and vegetables. Eat at least 5 servings of fruits and vegetables a day. Try to fill one-half of your plate with fruits and vegetables at each meal. Choose low-fat (lean) proteins. These  include low-fat cuts of meat, chicken without skin, fish, tofu, beans, and nuts. Eat low-fat dairy products. Avoid foods that: Are high in salt. Have saturated fat. Have trans fat. Have cholesterol. Are processed or pre-made. Count how many carbohydrates you eat and drink each day. Lifestyle If you drink alcohol: Limit how much you  have to: 0-1 drink a day for women who are not pregnant. 0-2 drinks a day for men. Know how much alcohol is in your drink. In the [place] one drink equals one 12 oz bottle of beer ( ), one 5 oz glass of wine ( ), or one 1 oz glass of hard liquor (44mL). Do not smoke or use any products that have nicotine  or tobacco. If you need help quitting, ask your doctor. Avoid secondhand smoke. Do not use drugs. Activity  Try to stay at a healthy weight. Get at least 30 minutes of exercise on most days, such as: Fast walking. Biking. Swimming. Medicines Take over-the-counter and prescription medicines only as told by your doctor. Avoid taking birth control pills. Talk to your doctor about the risks of taking birth control pills if: You are over 74 years old. You smoke. You get very bad headaches. You have had a blood clot. Where to find more information American Stroke Association: www.strokeassociation.org Get help right away if: You or a loved one has any signs of a stroke. BE FAST is an easy way to remember the warning signs: B - Balance. Dizziness, sudden trouble walking, or loss of balance. E - Eyes. Trouble seeing or a change in how you see. F - Face. Sudden weakness or loss of feeling of the face. The face or eyelid may droop on one side. A - Arms. Weakness or loss of feeling in an arm. This happens all of a sudden and most often on one side of the body. S - Speech. Sudden trouble speaking, slurred speech, or trouble understanding what people say. T - Time. Time to call emergency services. Write down what time symptoms started. You or a loved one has other signs of a stroke, such as: A sudden, very bad headache with no known cause. Feeling like you may vomit (nausea). Vomiting. A seizure. These symptoms may be an emergency. Get help right away. Call your local emergency services (911 in the [place] Do not wait to see if the symptoms will go away. Do not drive yourself to the  hospital. Summary You can help to prevent a stroke by eating healthy, exercising, and not smoking. It also helps to manage any health problems you have. Do not smoke or use any products that contain nicotine  or tobacco. Get help right away if you or a loved one has any signs of a stroke. This information is not intended to replace advice given to you by your health care provider. Make sure you discuss any questions you have with your health care provider. Document Revised: 01/30/2022 Document Reviewed: 01/30/2022 Elsevier Patient Education  2024 Elsevier Inc.    Follow-Up: At [facility], you and your health needs are our priority.  As part of our continuing mission to provide you with exceptional heart care, we have created designated Provider Care Teams.  These Care Teams include your primary Cardiologist (physician) and Advanced Practice Providers (APPs -  Physician Assistants and Nurse Practitioners) who all work together to provide you with the care you need, when you need it.  We recommend signing up for the patient portal called MyChart.  Sign up information is  provided on this After Visit Summary.  MyChart is used to connect with patients for Virtual Visits (Telemedicine).  Patients are able to view lab/test results, encounter notes, upcoming appointments, etc.  Non-urgent messages can be sent to your provider as well.   To learn more about what you can do with MyChart, go to [email or web address].    Your next appointment:   04/12/2023 WITH Callie Goodrich, PA-C

## 2023-03-21 ENCOUNTER — Other Ambulatory Visit: Payer: Self-pay

## 2023-03-21 ENCOUNTER — Encounter: Payer: Self-pay | Admitting: Sports Medicine

## 2023-03-21 LAB — BASIC METABOLIC PANEL
BUN/Creatinine Ratio: 8 — ABNORMAL LOW (ref 9–20)
BUN: 7 mg/dL (ref 6–24)
CO2: 27 mmol/L (ref 20–29)
Calcium: 9.7 mg/dL (ref 8.7–10.2)
Chloride: 98 mmol/L (ref 96–106)
Creatinine, Ser: 0.93 mg/dL (ref 0.76–1.27)
Glucose: 341 mg/dL — ABNORMAL HIGH (ref 70–99)
Potassium: 3.8 mmol/L (ref 3.5–5.2)
Sodium: 139 mmol/L (ref 134–144)
eGFR: 103 mL/min/{1.73_m2} (ref >59)

## 2023-03-22 NOTE — Progress Notes (Signed)
 Care Guide Pharmacy Note  03/22/2023 Name: Jordan Sparks MRN: [id number] DOB: May 04, 1975  Referred By: de Cuba, Raymond J, MD Reason for referral: Care Coordination (Outreach to schedule with pharm d )   Jordan Sparks is a 48 y.o. year old male who is a primary care patient of de Cuba, Quintin PARAS, MD.  Jordan Sparks was referred to the pharmacist for assistance related to: DMII  Successful contact was made with the patient to discuss pharmacy services including being ready for the pharmacist to call at least 5 minutes before the scheduled appointment time and to have medication bottles and any blood pressure readings ready for review. The patient agreed to meet with the pharmacist via telephone visit on (date/time).04/04/2023  Jordan Sparks , RMA     [facility] Guide  Direct Dial: [phone]  Website: [email or web address]

## 2023-03-23 ENCOUNTER — Other Ambulatory Visit: Payer: Self-pay

## 2023-03-23 ENCOUNTER — Encounter: Payer: Self-pay | Admitting: Sports Medicine

## 2023-03-23 ENCOUNTER — Ambulatory Visit (INDEPENDENT_AMBULATORY_CARE_PROVIDER_SITE_OTHER): Payer: Medicaid Other | Admitting: Sports Medicine

## 2023-03-23 DIAGNOSIS — M1611 Unilateral primary osteoarthritis, right hip: Secondary | ICD-10-CM

## 2023-03-23 DIAGNOSIS — M4722 Other spondylosis with radiculopathy, cervical region: Secondary | ICD-10-CM

## 2023-03-23 DIAGNOSIS — M25551 Pain in right hip: Secondary | ICD-10-CM | POA: Diagnosis not present

## 2023-03-23 DIAGNOSIS — E11649 Type 2 diabetes mellitus with hypoglycemia without coma: Secondary | ICD-10-CM | POA: Diagnosis not present

## 2023-03-23 MED ORDER — METHYLPREDNISOLONE ACETATE 40 MG/ML IJ SUSP
40.0000 mg | INTRAMUSCULAR | Status: AC | PRN
  Administered 2023-03-23: 40 mg via INTRA_ARTICULAR

## 2023-03-23 MED ORDER — LIDOCAINE HCL 1 % IJ SOLN
4.0000 mL | INTRAMUSCULAR | Status: AC | PRN
  Administered 2023-03-23: 4 mL

## 2023-03-23 NOTE — Progress Notes (Signed)
 Jordan Sparks - 48 y.o. male MRN [id number]  Date of birth: 12-10-1975  Office Visit Note: Visit Date: 03/23/2023 PCP: everitt Cuba, Quintin PARAS, MD Referred by: de Cuba, Raymond J, MD  Subjective: Chief Complaint  Patient presents with   Right Hip - Pain   HPI: Jordan Sparks is a pleasant 48 y.o. male who presents today for f/u of R-hip with injection and f/u shoulder/neck.  R-hip -with the cold weather and fluctuation this has exacerbated his hip pain.  This is starting to have walking and physical activity somewhat difficult.   Right shoulder/neck -the shoulder itself is still doing better after our glenohumeral joint injection, but feels like this is slightly starting to wear off.  More of his pain continues to be radicular rating down the arm into the hand.  He is taking gabapentin  300 mg at nighttime which does help in the night and with sleep.  Has his cervical MRI upcoming for 03/25/23.  Pertinent ROS were reviewed with the patient and found to be negative unless otherwise specified above in HPI.   Assessment & Plan: Visit Diagnoses:  1. Unilateral primary osteoarthritis, right hip   2. Pain in right hip   3. Type 2 diabetes mellitus with hypoglycemia without coma, without long-term current use of insulin  (HCC)   4. Other spondylosis with radiculopathy, cervical region    Plan: Impression is chronic right hip pain with osteoarthritis, through shared-decision making we did proceed with ultrasound-guided right intra-articular hip injection, patient tolerated well.  Advised on 48 hours of modified rest/activity.  May use ice/heat or his Tylenol  or meloxicam  15mg  for pain control.  He does have both shoulder pathology as well as likely cervical radiculopathy, he does have a cervical MRI upcoming over the next few days.  He will continue his HEP for the shoulder/neck.  Continue gabapentin  300 mg in the evening.  We will follow-up here in 2 weeks to see how the hip is  doing and review his cervical MRI to discuss next steps for this in his shoulder.  Follow-up: Return in about 2 weeks (around 04/06/2023) for for neck f/u with cerivcal MRI review.   Meds & Orders: No orders of the defined types were placed in this encounter.   Orders Placed This Encounter  Procedures   Large Joint Inj   US  Guided Needle Placement - No Linked Charges     Procedures: Large Joint Inj: R hip joint on 03/23/2023 10:30 AM Indications: pain Details: 22 G 3.5 in needle, ultrasound-guided anterior approach Medications: 4 mL lidocaine  1 %; 40 mg methylPREDNISolone  acetate 40 MG/ML Outcome: tolerated well, no immediate complications  Procedure: US -guided intra-articular hip injection, Right After discussion on risks/benefits/indications and informed verbal consent was obtained, a timeout was performed. Patient was lying supine on exam table. The hip was cleaned with betadine and alcohol swabs. Then utilizing ultrasound guidance, the patient's femoral head and neck junction was identified and subsequently injected with 4:1 lidocaine :depomedrol via an in-plane approach with ultrasound visualization of the injectate administered into the hip joint. Patient tolerated procedure well without immediate complications.  Procedure, treatment alternatives, risks and benefits explained, specific risks discussed. Consent was given by the patient. Immediately prior to procedure a time out was called to verify the correct patient, procedure, equipment, support staff and site/side marked as required. Patient was prepped and draped in the usual sterile fashion.          Clinical History: MRI LUMBAR SPINE WITHOUT CONTRAST  TECHNIQUE: Multiplanar, multisequence MR imaging of the lumbar spine was performed. No intravenous contrast was administered.   COMPARISON:  Lumbar spine radiographs 04/14/2021. CTA chest, abdomen, and pelvis 06/18/2020.   FINDINGS: The study is motion degraded,  moderately so on the axial sequences.   Segmentation: Standard.   Alignment: Trace retrolisthesis of L4 on L5. Straightening of the normal lumbar lordosis.   Vertebrae: No fracture, suspicious marrow lesion, or significant marrow edema. Mild chronic degenerative endplate changes at L5-S1.   Conus medullaris and cauda equina: Conus extends to the L1-2 level. Conus and cauda equina appear normal.   Paraspinal and other soft tissues: Unremarkable.   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   T12-L1 and L1-2: Negative.   L2-3: Minimal disc bulging without stenosis.   L3-4: Mild disc bulging without significant stenosis.   L4-5: Mild disc desiccation. Circumferential disc bulging and mild facet hypertrophy result in mild spinal stenosis, mild bilateral lateral recess stenosis, and mild-to-moderate bilateral neural foraminal stenosis.   L5-S1: Disc desiccation. Disc bulging and mild facet hypertrophy result in mild right and moderate left neural foraminal stenosis without spinal stenosis.   IMPRESSION: 1. Motion degraded examination. 2. 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     Electronically Signed   By: Dasie Hamburg M.D.   On: 05/16/2021 18:00  He reports that he has been smoking cigarettes and cigars. He started smoking about 33 years ago. He has a 8 pack-year smoking history. He has been exposed to tobacco smoke. He has never used smokeless tobacco.  Recent Labs    09/07/22 0946 02/05/23 1404  HGBA1C 9.7* 9.3*    Objective:    Physical Exam  Gen: Well-appearing, in no acute distress; non-toxic CV: Well-perfused. Warm.  Resp: Breathing unlabored on room air; no wheezing. Psych: Fluid speech in conversation; appropriate affect; normal thought process  Ortho  Exam - Right hip: No redness swelling or effusion, no greater trochanter TTP.  Positive FADIR, positive Stinchfield.  Imaging:  Narrative & Impression  CLINICAL DATA:  Cervicalgia.  Neck pain   EXAM: CERVICAL SPINE - 2-3 VIEW   COMPARISON:  None Available.   FINDINGS: Mild straightening of normal lordosis. No listhesis. Anterior spurring from C4-C5 through C6-C7 with preservation of disc spaces. No significant facet changes. No evidence of fracture or focal bone abnormality. No prevertebral soft tissue thickening.   IMPRESSION: Spondylosis with anterior spurring from C4-C5 through C6-C7.     Electronically Signed   By: Andrea Gasman M.D.   On: 12/14/2022 22:21    Past Medical/Family/Surgical/Social History: Medications & Allergies reviewed per EMR, new medications updated. Patient Active Problem List   Diagnosis Date Noted   Thickened nails 02/05/2023   Trigger thumb, right thumb 04/18/2022   Bipolar 1 disorder, mixed, moderate (HCC) 12/06/2021   Insomnia 12/06/2021   Encounter for long-term (current) use of medications 12/06/2021   Unilateral primary osteoarthritis, right hip 11/30/2021   Carpal tunnel syndrome, right upper limb 11/30/2021   Congestive heart failure (CHF) (HCC) 11/10/2021   Low back pain 10/11/2021   Peripheral neuropathy 10/11/2021   Pain in left shoulder 08/31/2021   Spinal stenosis of cervical region 06/09/2021   Nonischemic cardiomyopathy (HCC) 06/06/2021   Chest pain 06/01/2021   Chest pain of uncertain etiology 05/31/2021   Obesity, Class III, 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) 05/31/2021   Diabetes mellitus (HCC) 05/04/2021   Sleep apnea 05/04/2021   Hypertensive urgency 10/19/2020   GERD (gastroesophageal reflux disease) 02/09/2018   Hyperglycemia 02/08/2018   Atypical chest pain 02/08/2018   Hypokalemia 02/08/2018   Leukocytosis 02/08/2018   Nausea and vomiting 02/08/2018   Mood disorder (HCC)    Hypertension    Anxiety     Hyperglycemia without ketosis    Manic disorder (HCC)    Anosmia 10/26/2015   Chronic pansinusitis 10/26/2015   Laryngopharyngeal reflux (LPR) 10/26/2015   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   Nasal turbinate hypertrophy 10/26/2015   Smokes 10/26/2015   Snoring 10/26/2015   Past Medical History:  Diagnosis Date   Anxiety    Asthma    CHF (congestive heart failure) (HCC)    Coronary artery disease    Depression    Diabetes mellitus type 2 in obese    Hypertension    Manic disorder (HCC)    Obesity    Sleep apnea    Family History  Problem Relation Age of Onset   Seizures Brother    Heart failure Neg Hx    Past Surgical History:  Procedure Laterality Date   CARPAL TUNNEL RELEASE Right 12/19/2021   Procedure: RIGHT CARPAL TUNNEL RELEASE;  Surgeon: Barbarann Oneil BROCKS, MD;  Location: [facility];  Service: Orthopedics;  Laterality: Right;   Dental procedure     Social History   Occupational History   Occupation: truck hospital doctor    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

## 2023-03-23 NOTE — Progress Notes (Signed)
 Patient says that the weather seems to be adding to his pain. He says that his MRI is scheduled for this coming Sunday.

## 2023-03-25 ENCOUNTER — Ambulatory Visit
Admission: RE | Admit: 2023-03-25 | Discharge: 2023-03-25 | Disposition: A | Discharge status: Home / Self Care | Payer: Medicaid Other | Source: Ambulatory Visit | Attending: Sports Medicine | Admitting: Sports Medicine

## 2023-03-25 DIAGNOSIS — M542 Cervicalgia: Secondary | ICD-10-CM

## 2023-03-31 NOTE — Progress Notes (Signed)
Cardiology Office Note:    Date:  04/12/2023   ID:  Jordan Sparks, DOB [birth year], MRN [id number]  PCP:  de [place], Raymond J, MD  Cardiologist:  Parke Poisson, MD     Referring MD: de [place], Raymond J, MD   Chief Complaint: follow-up of CHF and hypertension  History of Present Illness:    Jordan Sparks is a 48 y.o. male with a history of mild non-obstructive CAD noted on coronary CTA in 05/2021, non-ischemic cardiomyopathy/ chronic systolic CHF with EF as low as 25-30% in 05/2021 but normalized to 55% on Echo in 08/2021,  uncontrolled hypertension, hyperlipidemia, type 2 diabetes mellitus, obstructive sleep apnea on CPAP, congenital spinal stenosis, anxiety/depression, mood disorder, obesity, polysubstance abuse (including prior cocaine use), and medication non-compliance who is followed by Dr. Jacques Navy and presents today for follow up of CHF and hypertension.   Patient was admitted in 05/2021 after presenting with chest pain and shortness of breath. High-sensitivity troponin was minimally elevated and flat in the 20s. Echo showed LVEF of 25-30% with global hypokinesis and moderate LVH as well as normal RV and mild MR. Coronary CTA showed a coronary calcium score of 24 with only minimal soft plaque in the mid LAD. Cardiomyopathy felt to be due to longstanding uncontrolled BP. Renal artery ultrasound showed 1-59% stenosis of bilateral renal arteries. He was started on GDMT. Unfortunately, medication compliance has been an issue. However, repeat Echo in 08/2021 showed improvement of LVEF to 55% with moderate LVH, grade 2 diastolic dysfunction, and only trivial MR.   He was last seen by me on 03/20/2023 after not being seen since 11/2021 at which time he was doing well from a cardiac standpoint other than his BP being uncontrolled. He had had issues with affording medications. He was restarted on multiple medications.  Patient presents today for follow-up.  He reports some  palpitations and chest discomfort over the last couple weeks.  He describes intermittent "fluttering" in his chest for the last couple weeks.  He states he will typically have this about 3 days a week.  He has some associated chest pressure/aching with this.  He sometimes has this discomfort outside of episodes of fluttering as well but they are normally associated.  He describes 1 episode of chest pressure/aching that radiated up his neck but this was an isolated event.  Pain typically last a few minutes and then resolves on its own. He denies any exertional chest pain.  He reports some of this fluttering/ chest discomfort in the office today. EKG showed normal sinus rhythm with no acute ischemic changes. No lightheadedness, dizziness, syncope.  No shortness of breath, orthopnea, or PND.  He states he "probably" has lower extremity edema but did not notice any on exam.  He also describes persistent right arm pain from a rotator cuff injury/cervical neck issues which is not new.   BP mildly elevated but much improved from last visit. Initially 152/96 and then 148/96 on my personal recheck at the end of visit.   EKGs/Labs/Other Studies Reviewed:    The following studies were reviewed:  Echocardiogram 05/31/2021: Impressions:  1. Left ventricular ejection fraction, by estimation, is 25 to 30%. The  left ventricle has severely decreased function. The left ventricle  demonstrates global hypokinesis. There is moderate concentric left  ventricular hypertrophy. Indeterminate  diastolic filling due to E-A fusion.   2. Right ventricular systolic function is normal. The right ventricular  size is normal. Tricuspid regurgitation signal is inadequate  for assessing  PA pressure.   3. The mitral valve is grossly normal. Mild mitral valve regurgitation.  No evidence of mitral stenosis.   4. The aortic valve is tricuspid. Aortic valve regurgitation is not  visualized. No aortic stenosis is present.   5. The  inferior vena cava is dilated in size with <50% respiratory  variability, suggesting right atrial pressure of 15 mmHg.  _______________   Renal Artery Ultrasound 06/01/2021: Summary:  Renal:  - Right: 1-59% stenosis of the right renal artery. Normal right resistive Index. Normal size right kidney.  - Left:  1-59% stenosis of the left renal artery. Normal left resistive Index. Normal size of left kidney.    Mesenteric:  Areas of limited visceral study include mesenteric arteries and left renal  artery.  _______________   Coronary CTA 06/01/2021: Impressions: 1. Coronary calcium score of 24. Elevated based on age, race, and gender. 2. Normal coronary origin with left dominance. 3. CAD-RADS 1. Minimal non-obstructive CAD (1-24%). Consider non-atherosclerotic causes of chest pain. Consider preventive therapy and risk factor modification. _______________   Echocardiogram 09/09/2021: Impressions:  1. Left ventricular ejection fraction, by estimation, is 55%. The left  ventricle has normal function. The left ventricle has no regional wall  motion abnormalities. There is moderate concentric left ventricular  hypertrophy. Left ventricular diastolic  parameters are consistent with Grade II diastolic dysfunction  (pseudonormalization).   2. Right ventricular systolic function is normal. The right ventricular  size is normal.   3. Left atrial size was mild to moderately dilated.   4. The mitral valve is normal in structure. Trivial mitral valve  regurgitation. No evidence of mitral stenosis.   5. The aortic valve is normal in structure. Aortic valve regurgitation is  not visualized. No aortic stenosis is present.   6. Aortic dilatation noted. There is borderline dilatation of the  ascending aorta, measuring 38 mm.   7. The inferior vena cava is normal in size with greater than 50%  respiratory variability, suggesting right atrial pressure of 3 mmHg.   EKG:  EKG ordered today.   EKG  Interpretation Date/Time:  Thursday April 12 2023 08:41:11 EST Ventricular Rate:  95 PR Interval:  202 QRS Duration:  104 QT Interval:  376 QTC Calculation: 472 R Axis:   43  Text Interpretation: Normal sinus rhythm Possible Left atrial enlargement When compared with ECG of 20-Mar-2023 12:09, No significant change was found Confirmed by Marjie Skiff [phone]) on 04/12/2023 9:03:36 AM    Recent Labs: 09/07/2022: ALT 24; Hemoglobin 13.8; Platelets 274 03/20/2023: BUN 7; Creatinine, Ser 0.93; Potassium 3.8; Sodium 139  Recent Lipid Panel    Component Value Date/Time   CHOL 189 05/04/2021 0953   TRIG 106 05/04/2021 0953   HDL 54 05/04/2021 0953   CHOLHDL 3.5 05/04/2021 0953   CHOLHDL 5.2 02/09/2018 0221   VLDL 37 02/09/2018 0221   LDLCALC 116 (H) 05/04/2021 0953    Physical Exam:    Vital Signs: BP (!) 148/96   Pulse 93   Ht 5\' 10"  (1.778 m)   Wt 266 lb (120.7 kg)   SpO2 96%   BMI 38.17 kg/m     Wt Readings from Last 3 Encounters:  04/12/23 266 lb (120.7 kg)  03/20/23 269 lb (122 kg)  03/01/23 269 lb 11.2 oz (122.3 kg)     General: 48 y.o. obese African-American male in no acute distress. HEENT: Normocephalic and atraumatic. Sclera clear.  Neck: Supple. No carotid bruits. No JVD. Heart: RRR.  No murmurs, gallops, or rubs.  Lungs: No increased work of breathing. Clear to ausculation bilaterally. No wheezes, rhonchi, or rales.  Abdomen: Soft, non-distended, and non-tender to palpation.  Extremities: No lower extremity edema.   Skin: Warm and dry. Neuro: No focal deficits. Psych: Normal affect. Responds appropriately.  Assessment:    1. Palpitations   2. Chest pain of uncertain etiology   3. Non-obstructive CAD   4. Chronic systolic CHF (congestive heart failure) (HCC)   5. Non-ischemic cardiomyopathy (HCC)   6. Hypertension, unspecified type   7. Hyperlipidemia, unspecified hyperlipidemia type   8. Type 2 diabetes mellitus with complication, without long-term  current use of insulin (HCC)   9. Obstructive sleep apnea     Plan:    Palpitations Patient describes intermittent flutter over the last couple of weeks with associated chest pressure.  - EKG today shows normal sinus rhythm with rates in the 90s.  - Will increase Coreg to 25mg  twice daily.  - Will check BMET, Magnesium, and TSH today.  - Will order 2 weeks Zio monitor.    Chest Pain Non-Obstructive CAD Coronary CTA in 05/2021 showed a coronary calcium score of 24 with only minimal soft plaque in the mid LAD. - He reports some chest pressure/ aching primarily in setting of palpitations. No exertional chest pain.  - EKG shows no acute ischemic changes.  - Continue aspirin and statin. - Will increase Coreg as above and get 2 week monitor.   Non-Ischemic Cardiomyopathy Chronic Systolic CHF Initially diagnosed in 05/2021 during admission for acute CHF. Echo at that time showed LVEF of 25-30% with global hypokinesis and moderate LVH as well as normal RV and mild MR. Coronary CTA showed only minimal CAD. Felt to be due to uncontrolled hypertension. Repeat Echo in 08/2021 after being started on GDMT showed LVEF of 55% with no regional wall motion abnormalities, moderate LVH, grade 2 diastolic dysfunction, and only trivial MR. - Euvolemic on exam.  - Continue Lasix 20mg  twice daily.  - Will increase Coreg to 25mg  twice daily given reports of palpitations.  - Continue Entresto 97-103mg  twice daily.  - Continue Eplerenone 25mg  daily. - Continue Hydralazine 25mg  three time daily. - He is currently prescribed Farxiga 5mg  daily. Will increase to 10mg  daily for CHF.  - Continue daily weights and sodium/fluid restrictions.  - Will repeat a BMET today.    Hypertension BP mildly elevated. Initially 152/96 and then 148/96 on my personal recheck at the end of visit.  - Continue medications for CHF as above. Will increase Coreg.    Hyperlipidemia Lipid panel in 04/2021: Total Cholesterol 189,  Triglycerides 106, HDL 54, LDL 116. LDL goal <70 given CAD. - Continue Crestor 20mg  daily.  - He is overdue for repeat labs. Did not have time to discuss today given acute concerns about. Can discuss at follow-up visit.   Type 2 Diabetes Mellitus Hemoglobin A1c 9.3% in 01/2023. - On Metformin and Farxiga. - Management per PCP.   Obstructive Sleep Apnea - Continue CPAP.   Disposition: Follow up in about 1 month.   Signed, Corrin Parker, PA-C  04/12/2023 8:30 PM    [facility]

## 2023-04-04 ENCOUNTER — Other Ambulatory Visit: Payer: Self-pay | Admitting: Pharmacist

## 2023-04-04 NOTE — Progress Notes (Addendum)
04/04/2023 Name: Jordan Sparks MRN: [id number] DOB: 09-Oct-1975  Chief Complaint  Patient presents with   Medication Management    Jordan Sparks is a 48 y.o. year old male who presented for a telephone visit.   They were referred to the pharmacist by their PCP for assistance in managing complex medication management.    Subjective:  Care Team: Primary Care Provider: de [place], Raymond J, MD ; Next Scheduled Visit: 04/11/23 Clinical Pharmacist: Marlowe Aschoff, PharmD  Medication Access/Adherence  Current Pharmacy:  [facility] [address] [place] [address] Phone: [phone] Fax: [phone]  [facility] [address] [place] [address] Phone: [phone] Fax: [phone]  [place] [facility] [address] [place] [address] Phone: [phone] Fax: [phone]   Patient reports affordability concerns with their medications: Yes  Patient reports access/transportation concerns to their pharmacy: No  Patient reports adherence concerns with their medications:  No  Using 4x per day pillbox now   Hypertension:  Current medications: Amlodipine 10mg  daily, Carvedilol 12.5mg  twice a day, Eplerenone 25mg  daily, Hydralazine 25mg  three times a day, Entresto 97-103mg  twice a day Medications previously tried:   Patient has a validated, automated, upper arm home BP cuff Current blood pressure readings readings: Has not been checking  Patient denies hypotensive s/sx including dizziness, lightheadedness.  Patient reports hypertensive symptoms including headache, chest pain, shortness of breath; had short span of chest pain earlier today    Medication Management:  Current adherence strategy: 4x/day pillbox  Patient reports Fair adherence to medications  Patient reports the following barriers to adherence: Medication  cost   Past due: Aripiprazole 5mg  (needed PA prior) Wellbutrin 150mg  (insurance issues with old card prior) [place] 10mg  (needs PA) Rosuvastatin 20mg  (just dropped off of fills over time)  Has: Amlodipine 10mg - 12/01/23 for 90DS (still has due to non-adherence prior and feels like not helping) Carvedilol 12.5mg - 1/7 90DS Hydralazine 25mg - 1/7 90DS Metformin 1000mg - 1/8 90DS  Has but only 30DS that needs fixed next month to 90DS: Divalproex 250mg - 1/8 30DS Duloxetine 60mg - 1/8 30DS Eplerenone 25mg - 1/8 30DS Entresto 97-103mg - 1/8 30DS  Assess prior to re-starting: Furosemide 20mg  + Kdur , Gabapentin 300mg , Vitamin D weekly    Current insurance: [place] Medicaid with $4 copays Used [facility] charge account recently to afford medications; works at [employer]   Objective:  Lab Results  Component Value Date   HGBA1C 9.3 (H) 02/05/2023    Lab Results  Component Value Date   CREATININE 0.93 03/20/2023   BUN 7 03/20/2023   NA 139 03/20/2023   K 3.8 03/20/2023   CL 98 03/20/2023   CO2 27 03/20/2023    Lab Results  Component Value Date   CHOL 189 05/04/2021   HDL 54 05/04/2021   LDLCALC 116 (H) 05/04/2021   TRIG 106 05/04/2021   CHOLHDL 3.5 05/04/2021    Medications Reviewed Today   Medications were not reviewed in this encounter       Assessment/Plan:   Medication Management: - Currently strategy sufficient to maintain appropriate adherence to prescribed medication regimen - Suggested use of weekly pill box to organize medications - Patient will continue to use the charge account as needed going forward    Follow Up Plan:  - Follow-up call in 1 month to see how his new medications are doing with adherence - Set task for 04/18/23 to call pharmacy to get 30DS meds  refilled for 90DS instead and add the 4 medications that are past due with shipment  - Divalproex 250mg - 1/8 30DS (will need 90DS refill) - Duloxetine 60mg - 1/8 30DS (will need 90DS refill) - Eplerenone  25mg - 1/8 30DS - Entresto 97-103mg - 1/8 30DS (will need 90DS refill) - Aripiprazole 5mg  - Wellbutrin 150mg  (needs 90DS refill) - Farxiga 10mg  (need to send Rx reflecting dose of 10mg  90DS) - Rosuvastatin 20mg  - Submit PA for Farxiga 10mg  today   Marlowe Aschoff, PharmD [facility] Phone Number: [phone]

## 2023-04-06 ENCOUNTER — Ambulatory Visit (INDEPENDENT_AMBULATORY_CARE_PROVIDER_SITE_OTHER): Payer: Medicaid Other | Admitting: Sports Medicine

## 2023-04-06 ENCOUNTER — Encounter: Payer: Self-pay | Admitting: Sports Medicine

## 2023-04-06 DIAGNOSIS — G9589 Other specified diseases of spinal cord: Secondary | ICD-10-CM | POA: Diagnosis not present

## 2023-04-06 DIAGNOSIS — M4802 Spinal stenosis, cervical region: Secondary | ICD-10-CM

## 2023-04-06 DIAGNOSIS — E11649 Type 2 diabetes mellitus with hypoglycemia without coma: Secondary | ICD-10-CM

## 2023-04-06 DIAGNOSIS — M4722 Other spondylosis with radiculopathy, cervical region: Secondary | ICD-10-CM | POA: Diagnosis not present

## 2023-04-06 DIAGNOSIS — Z720 Tobacco use: Secondary | ICD-10-CM

## 2023-04-06 NOTE — Progress Notes (Signed)
Patient says that he is still having pain in the shoulder and down the arm, as well as the numbness and tingling that goes down the arm and to the fingers.  He says that the hip injection did help some, but not 100%. He finds that he is still limping, and his pain comes and goes.

## 2023-04-06 NOTE — Progress Notes (Signed)
Jordan Sparks - 48 y.o. male MRN [id number]  Date of birth: Oct 19, 1975  Office Visit Note: Visit Date: 04/06/2023 PCP: de Sparks, Jordan J, MD Referred by: de Sparks, Jordan J, MD  Subjective: Chief Complaint  Patient presents with   Neck - Follow-up   HPI: Jordan Sparks is a pleasant 48 y.o. male who presents today for follow-up of neck pain with RUE radiculopathy. Here for MRI review as well.  Jordan Sparks continues to have pain in the shoulder as well as numbness and tingling that radiates from the right side of the neck all the way down into his fingers it would change but usually anywhere from the thumb to 2-3rd digits.  He also is endorsing weakness with the arm, he feels like he does not have the strength in the right arm compared to the left side.  As a reminder, we did perform an intra-articular injection into the right shoulder months ago which gave him good pain relief of the shoulder specifically, however he still felt like the right arm was weak.  He is using gabapentin 300 mg once to twice daily.  He has used meloxicam in the past but discontinued this without much relief.  *Notable pertinent surgical history:  He is a type-ii diabetic, his last A1c was: Lab Results  Component Value Date   HGBA1C 9.3 (H) 02/05/2023   He does smoke about 1 black and mild cigarette daily, sometimes 2. He does not use cigarettes, THC, or other tobacco products.  Pertinent ROS were reviewed with the patient and found to be negative unless otherwise specified above in HPI.   Assessment & Plan: Visit Diagnoses:  1. Cervical cord myelomalacia (HCC)   2. Other spondylosis with radiculopathy, cervical region   3. Spinal stenosis of cervical region   4. Type 2 diabetes mellitus with hypoglycemia without coma, without long-term current use of insulin (HCC)   5. Tobacco use    Plan: Impression is progressive cervical spinal stenosis with right upper extremity radiculopathy.  Did  discuss with him that his worrisome findings is that he is having progressive right upper extremity weakness and his MRI does show evidence of myomalacia.  Given this, I discussed the importance of having him follow-up with our spine physicians, Dr. Christell Sparks for further evaluation.  I did discuss with him getting his MRI findings and his weakness with DTR blunting, he may require surgical intervention.  Other treatment options may include cervical ESI, but we will leave this ultimately up to Dr. Christell Sparks.  For his symptom control he may continue gabapentin 300 mg twice daily.  He is not a well-controlled diabetic, I did stress the importance of continuing his diabetic medication and lowering his A1c if any surgical procedure will to be performed.  He only smokes about 1 black and milds cigarettes a day, discussed discontinuing this prior to possible surgery. Counseling provided.  Follow-up: Return for make appt with Dr. Christell Sparks for cervical radiculopathy/myelomalacia.   *The patient currently smokes about 1 black and mild cigarette daily. We did discuss the patient's tobacco use and the importance of tobacco cessation or cutting back on the number of cigarettes as this adversely affects his cardiac health as well as delay surgical healing that could need to be performed for his cervical stenosis and myelmalacia.Smoking cessation techniques were discussed. The patient has agreed to work on this. Approximately 3 minutes were spent on discussion on tobacco use and cessation in the office today.   Meds & Orders:  No orders of the defined types were placed in this encounter.  No orders of the defined types were placed in this encounter.    Procedures: No procedures performed      Clinical History:   He reports that he has been smoking cigarettes and cigars. He started smoking about 34 years ago. He has a 8 pack-year smoking history. He has been exposed to tobacco smoke. He has never used smokeless tobacco.   Recent Labs    09/07/22 0946 02/05/23 1404  HGBA1C 9.7* 9.3*    Objective:   Physical Exam  Gen: Well-appearing, in no acute distress; non-toxic CV: Well-perfused. Warm.  Resp: Breathing unlabored on room air; no wheezing. Psych: Fluid speech in conversation; appropriate affect; normal thought process  Ortho Exam - Cervical: There is right-sided cervical hypertonicity.  There is positive Spurling's test on the right, negative on the left.  There is 1+ bicep DTR on the right, trace reflex on the right compared to 2+ biceps and triceps on the contralateral left arm.  There is weakness with shoulder abduction as well as weakness.  Elbow flexion, elbow extension seems preserved.  There is no weakness with grip testing.  Imaging:  MR Cervical Spine w/o contrast CLINICAL DATA:  Initial evaluation for chronic neck pain with right-sided cervical radiculopathy.  EXAM: MRI CERVICAL SPINE WITHOUT CONTRAST  TECHNIQUE: Multiplanar, multisequence MR imaging of the cervical spine was performed. No intravenous contrast was administered.  COMPARISON:  Radiograph from 12/01/2022.  FINDINGS: Alignment: Straightening of the normal cervical lordosis. No listhesis.  Vertebrae: Vertebral body height maintained without acute or chronic fracture. Decreased T1 signal intensity noted within the visualized bone marrow, nonspecific, but most commonly related to anemia, smoking or obesity. No worrisome osseous lesions. No abnormal marrow edema.  Cord: Patchy signal abnormality seen involving the cervical cord at the level of C3-4, consistent with chronic myelomalacia. Otherwise normal signal and morphology.  Posterior Fossa, vertebral arteries, paraspinal tissues: Unremarkable.  Disc levels:  A degree of underlying congenital spinal stenosis noted.  C2-C3: Small central disc protrusion minimally indents the ventral thecal sac. No significant spinal stenosis. Foramina remain  patent.  C3-C4: Broad base central disc protrusion indents the ventral thecal sac (series 5, image 11). Secondary cord flattening with patchy cord signal changes, consistent with chronic myelomalacia. Acquired on congenital severe spinal stenosis with the thecal sac measuring 5 mm in AP diameter. Severe left with moderate right C4 foraminal narrowing.  C4-C5: Mild uncovertebral spurring without significant disc bulge. Minimal left-sided facet hypertrophy. No significant spinal stenosis. Mild left C5 foraminal narrowing. Right neural foramina remains patent.  C5-C6: Broad-based right paracentral to foraminal disc protrusion (series 5, image 21). Secondary flattening of the ventral thecal sac with mild cord flattening but no cord signal changes. Acquired on congenital moderate to severe spinal stenosis with the thecal sac measuring 7 mm in AP diameter. Moderate right with mild left C6 foraminal narrowing. This is presumably the symptomatic level given provided history.  C6-C7: Tiny central to right paracentral disc protrusion minimally indents the ventral thecal sac. No significant spinal stenosis. Uncovertebral spurring without significant foraminal encroachment.  C7-T1: Seen only on sagittal projection. Grossly negative. No stenosis.  IMPRESSION: 1. Broad-based right paracentral to foraminal disc protrusion at C5-6 with resultant moderate to severe spinal stenosis, with moderate right C6 foraminal narrowing. This is presumably the symptomatic level given provided history. 2. Broad-based central disc protrusion at C3-4 with resultant severe spinal stenosis. Patchy cord signal changes at this level  consistent with chronic myelomalacia. 3. Additional mild noncompressive disc protrusions at C2-3 and C6-7 without significant stenosis. 4. Underlying congenital spinal stenosis.  Electronically Signed   By: Rise Mu M.D.   On: 03/25/2023 19:07  Past  Medical/Family/Surgical/Social History: Medications & Allergies reviewed per EMR, new medications updated. Patient Active Problem List   Diagnosis Date Noted   Thickened nails 02/05/2023   Trigger thumb, right thumb 04/18/2022   Bipolar 1 disorder, mixed, moderate (HCC) 12/06/2021   Insomnia 12/06/2021   Encounter for long-term (current) use of medications 12/06/2021   Unilateral primary osteoarthritis, right hip 11/30/2021   Carpal tunnel syndrome, right upper limb 11/30/2021   Congestive heart failure (CHF) (HCC) 11/10/2021   Low back pain 10/11/2021   Peripheral neuropathy 10/11/2021   Pain in left shoulder 08/31/2021   Spinal stenosis of cervical region 06/09/2021   Nonischemic cardiomyopathy (HCC) 06/06/2021   Chest pain 06/01/2021   Chest pain of uncertain etiology 05/31/2021   Obesity, Class III, BMI 40-49.9 (morbid obesity) (HCC) 05/31/2021   Diabetes mellitus (HCC) 05/04/2021   Sleep apnea 05/04/2021   Hypertensive urgency 10/19/2020   GERD (gastroesophageal reflux disease) 02/09/2018   Hyperglycemia 02/08/2018   Atypical chest pain 02/08/2018   Hypokalemia 02/08/2018   Leukocytosis 02/08/2018   Nausea and vomiting 02/08/2018   Mood disorder (HCC)    Hypertension    Anxiety    Hyperglycemia without ketosis    Manic disorder (HCC)    Anosmia 10/26/2015   Chronic pansinusitis 10/26/2015   Laryngopharyngeal reflux (LPR) 10/26/2015   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   Nasal turbinate hypertrophy 10/26/2015   Smokes 10/26/2015   Snoring 10/26/2015   Past Medical History:  Diagnosis Date   Anxiety    Asthma    CHF (congestive heart failure) (HCC)    Coronary artery disease    Depression    Diabetes mellitus type 2 in obese    Hypertension    Manic disorder (HCC)    Obesity    Sleep apnea    Family History  Problem Relation Age of Onset   Seizures Brother    Heart failure Neg Hx    Past Surgical History:  Procedure Laterality Date   CARPAL TUNNEL RELEASE  Right 12/19/2021   Procedure: RIGHT CARPAL TUNNEL RELEASE;  Surgeon: Eldred Manges, MD;  Location: [facility];  Service: Orthopedics;  Laterality: Right;   Dental procedure     Social History   Occupational History   Occupation: truck Hospital doctor    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

## 2023-04-11 ENCOUNTER — Ambulatory Visit (HOSPITAL_BASED_OUTPATIENT_CLINIC_OR_DEPARTMENT_OTHER): Payer: Medicaid Other | Admitting: Family Medicine

## 2023-04-12 ENCOUNTER — Ambulatory Visit: Payer: Medicaid Other | Attending: Student | Admitting: Student

## 2023-04-12 ENCOUNTER — Other Ambulatory Visit: Payer: Self-pay

## 2023-04-12 ENCOUNTER — Ambulatory Visit: Payer: Medicaid Other

## 2023-04-12 ENCOUNTER — Encounter: Payer: Self-pay | Admitting: Student

## 2023-04-12 VITALS — BP 148/96 | HR 93 | Ht 70.0 in | Wt 266.0 lb

## 2023-04-12 DIAGNOSIS — R002 Palpitations: Secondary | ICD-10-CM

## 2023-04-12 DIAGNOSIS — E785 Hyperlipidemia, unspecified: Secondary | ICD-10-CM

## 2023-04-12 DIAGNOSIS — R079 Chest pain, unspecified: Secondary | ICD-10-CM

## 2023-04-12 DIAGNOSIS — G4733 Obstructive sleep apnea (adult) (pediatric): Secondary | ICD-10-CM

## 2023-04-12 DIAGNOSIS — I10 Essential (primary) hypertension: Secondary | ICD-10-CM

## 2023-04-12 DIAGNOSIS — I2510 Atherosclerotic heart disease of native coronary artery without angina pectoris: Secondary | ICD-10-CM | POA: Diagnosis not present

## 2023-04-12 DIAGNOSIS — I5022 Chronic systolic (congestive) heart failure: Secondary | ICD-10-CM

## 2023-04-12 DIAGNOSIS — I428 Other cardiomyopathies: Secondary | ICD-10-CM

## 2023-04-12 DIAGNOSIS — E118 Type 2 diabetes mellitus with unspecified complications: Secondary | ICD-10-CM

## 2023-04-12 LAB — BASIC METABOLIC PANEL
BUN/Creatinine Ratio: 20 (ref 9–20)
BUN: 16 mg/dL (ref 6–24)
CO2: 20 mmol/L (ref 20–29)
Calcium: 9.4 mg/dL (ref 8.7–10.2)
Chloride: 100 mmol/L (ref 96–106)
Creatinine, Ser: 0.79 mg/dL (ref 0.76–1.27)
Glucose: 221 mg/dL — ABNORMAL HIGH (ref 70–99)
Potassium: 3.9 mmol/L (ref 3.5–5.2)
Sodium: 138 mmol/L (ref 134–144)
eGFR: 112 mL/min/{1.73_m2} (ref >59)

## 2023-04-12 LAB — TSH: TSH: 0.279 u[IU]/mL — ABNORMAL LOW (ref 0.450–4.500)

## 2023-04-12 LAB — MAGNESIUM: Magnesium: 1.7 mg/dL (ref 1.6–2.3)

## 2023-04-12 MED ORDER — DAPAGLIFLOZIN PROPANEDIOL 10 MG PO TABS
10.0000 mg | ORAL_TABLET | Freq: Every day | ORAL | 3 refills | Status: DC
  Filled 2023-04-12: qty 30, 30d supply, fill #0
  Filled 2023-05-22: qty 30, 30d supply, fill #1
  Filled 2023-07-04: qty 30, 30d supply, fill #2
  Filled 2023-07-30: qty 30, 30d supply, fill #3

## 2023-04-12 MED ORDER — CARVEDILOL 25 MG PO TABS
25.0000 mg | ORAL_TABLET | Freq: Two times a day (BID) | ORAL | 3 refills | Status: DC
  Filled 2023-04-12: qty 30, 15d supply, fill #0

## 2023-04-12 NOTE — Progress Notes (Unsigned)
Enrolled for [employer] to mail a ZIO XT long term holter monitor to the patients address on file.   Dr. Jacques Navy to read.

## 2023-04-12 NOTE — Patient Instructions (Signed)
Medication Instructions:  INCREASE COREG 25MG  TWICE DAILY INCREASE FARXIGA 10MG  DAILY *If you need a refill on your cardiac medications before your next appointment, please call your pharmacy*  Lab Work: BMET,TSH AND MAG TODAY If you have labs (blood work) drawn today and your tests are completely normal, you will receive your results only by:  MyChart Message (if you have MyChart) OR A paper copy in the mail If you have any lab test that is abnormal or we need to change your treatment, we will call you to review the results.  Testing/Procedures: Your physician has requested you wear a ZIO patch monitor for 14 days. SEE BELOW  Follow-Up: At [facility], you and your health needs are our priority.  As part of our continuing mission to provide you with exceptional heart care, we have created designated Provider Care Teams.  These Care Teams include your primary Cardiologist (physician) and Advanced Practice Providers (APPs -  Physician Assistants and Nurse Practitioners) who all work together to provide you with the care you need, when you need it.  Your next appointment:   1 month(s)  Provider:   Marjie Skiff, PA-C    Other Instructions Jordan Sparks- Long Term Monitor Instructions  Your physician has requested you wear a ZIO patch monitor for 14 days.  This is a single patch monitor. [employer] supplies one patch monitor per enrollment. Additional stickers are not available. Please do not apply patch if you will be having a Nuclear Stress Test,  Echocardiogram, Cardiac CT, MRI, or Chest Xray during the period you would be wearing the  monitor. The patch cannot be worn during these tests. You cannot remove and re-apply the  ZIO XT patch monitor.  Your ZIO patch monitor will be mailed 3 day USPS to your address on file. It may take 3-5 days  to receive your monitor after you have been enrolled.  Once you have received your monitor, please review the enclosed instructions. Your  monitor  has already been registered assigning a specific monitor serial # to you.  Billing and Patient Assistance Program Information  We have supplied [employer] with any of your insurance information on file for billing purposes. [employer] offers a sliding scale Patient Assistance Program for patients that do not have  insurance, or whose insurance does not completely cover the cost of the ZIO monitor.  You must apply for the Patient Assistance Program to qualify for this discounted rate.  To apply, please call [employer] at [phone], select option 4, select option 2, ask to apply for  Patient Assistance Program. Meredeth Ide will ask your household income, and how many people  are in your household. They will quote your out-of-pocket cost based on that information.  [employer] will also be able to set up a 92-month, interest-free payment plan if needed.  Applying the monitor   Shave hair from upper left chest.  Hold abrader disc by orange tab. Rub abrader in 40 strokes over the upper left chest as  indicated in your monitor instructions.  Clean area with 4 enclosed alcohol pads. Let dry.  Apply patch as indicated in monitor instructions. Patch will be placed under collarbone on left  side of chest with arrow pointing upward.  Rub patch adhesive wings for 2 minutes. Remove white label marked "1". Remove the white  label marked "2". Rub patch adhesive wings for 2 additional minutes.  While looking in a mirror, press and release button in center of patch. A small green light will  flash 3-4 times. This will be your only indicator that the monitor has been turned on.  Do not shower for the first 24 hours. You may shower after the first 24 hours.  Press the button if you feel a symptom. You will hear a small click. Record Date, Time and  Symptom in the Patient Logbook.  When you are ready to remove the patch, follow instructions on the last 2 pages of Patient  Logbook. Stick patch monitor onto the  last page of Patient Logbook.  Place Patient Logbook in the blue and white box. Use locking tab on box and tape box closed  securely. The blue and white box has prepaid postage on it. Please place it in the mailbox as  soon as possible. Your physician should have your test results approximately 7 days after the  monitor has been mailed back to [facility].  Call [facility] Customer Care at [phone] if you have questions regarding  your ZIO XT patch monitor. Call them immediately if you see an orange light blinking on your  monitor.  If your monitor falls off in less than 4 days, contact our Monitor department at [phone].  If your monitor becomes loose or falls off after 4 days call [employer] at [phone] for  suggestions on securing your monitor

## 2023-04-13 ENCOUNTER — Other Ambulatory Visit: Payer: Self-pay

## 2023-04-13 DIAGNOSIS — R002 Palpitations: Secondary | ICD-10-CM

## 2023-04-17 ENCOUNTER — Telehealth: Payer: Self-pay | Admitting: Pharmacist

## 2023-04-17 ENCOUNTER — Other Ambulatory Visit: Payer: Self-pay

## 2023-04-17 ENCOUNTER — Other Ambulatory Visit (HOSPITAL_COMMUNITY): Payer: Self-pay

## 2023-04-17 DIAGNOSIS — F3162 Bipolar disorder, current episode mixed, moderate: Secondary | ICD-10-CM

## 2023-04-17 MED ORDER — DIVALPROEX SODIUM 250 MG PO DR TAB
250.0000 mg | DELAYED_RELEASE_TABLET | Freq: Two times a day (BID) | ORAL | 1 refills | Status: DC
  Filled 2023-04-17: qty 180, 90d supply, fill #0

## 2023-04-17 MED ORDER — DULOXETINE HCL 60 MG PO CPEP
60.0000 mg | ORAL_CAPSULE | Freq: Two times a day (BID) | ORAL | 1 refills | Status: DC
  Filled 2023-04-17: qty 180, 90d supply, fill #0

## 2023-04-17 MED ORDER — BUPROPION HCL ER (XL) 150 MG PO TB24
150.0000 mg | ORAL_TABLET | ORAL | 1 refills | Status: DC
  Filled 2023-04-17: qty 90, 90d supply, fill #0

## 2023-04-17 MED ORDER — ARIPIPRAZOLE 5 MG PO TABS
5.0000 mg | ORAL_TABLET | Freq: Every day | ORAL | 1 refills | Status: DC
  Filled 2023-04-17: qty 30, 30d supply, fill #0
  Filled 2023-06-04: qty 30, 30d supply, fill #1

## 2023-04-17 NOTE — Progress Notes (Addendum)
   04/17/2023  Patient ID: Cinderella FORBES Marin, male   DOB: 08-Mar-1976, 48 y.o.   MRN: [id number]  Sent 90DS Rx's for Depakote , Duloxetine , Bupropion , Eplerenone , and Aripiprazole  to [facility] so copay will be $4 for 90DS, rather than $4 for each 30DS.    Can be filled for 90DS at $4/month:  - Divalproex  250mg - 1/8  - Duloxetine  60mg - 1/8  - Eplerenone  25mg - 1/8  - Rosuvastatin  20mg - ran out  Can only be filled for 30DS at $4/month: Entresto  97-103mg  - Aripiprazole  5mg  - Farxiga  10mg - sold 04/12/23 for 30DS  Will follow-up with the pharmacy to ensure it's filled properly later today.   Called pharmacy and Rx's filled appropriately for day supply listed above.  Update: Appears all of the Rx's have been shipped appropriately   Aloysius Breeding, PharmD [facility] Phone Number: [phone]

## 2023-04-18 ENCOUNTER — Other Ambulatory Visit (HOSPITAL_COMMUNITY): Payer: Self-pay

## 2023-04-18 ENCOUNTER — Other Ambulatory Visit: Payer: Self-pay

## 2023-04-18 ENCOUNTER — Ambulatory Visit (HOSPITAL_COMMUNITY): Payer: Self-pay | Admitting: Student

## 2023-04-18 MED ORDER — ROSUVASTATIN CALCIUM 20 MG PO TABS
20.0000 mg | ORAL_TABLET | Freq: Every day | ORAL | 3 refills | Status: DC
  Filled 2023-04-18: qty 90, 90d supply, fill #0
  Filled 2023-07-14: qty 90, 90d supply, fill #1
  Filled 2023-10-11: qty 90, 90d supply, fill #2
  Filled 2024-01-10: qty 90, 90d supply, fill #3

## 2023-04-18 MED ORDER — EPLERENONE 25 MG PO TABS
25.0000 mg | ORAL_TABLET | Freq: Every day | ORAL | 3 refills | Status: AC
  Filled 2023-04-18: qty 90, 90d supply, fill #0
  Filled 2023-07-14: qty 90, 90d supply, fill #1
  Filled 2023-11-06: qty 90, 90d supply, fill #2
  Filled 2024-01-31: qty 90, 90d supply, fill #3

## 2023-04-26 ENCOUNTER — Ambulatory Visit (INDEPENDENT_AMBULATORY_CARE_PROVIDER_SITE_OTHER): Payer: Medicaid Other | Admitting: Orthopedic Surgery

## 2023-04-26 ENCOUNTER — Other Ambulatory Visit (INDEPENDENT_AMBULATORY_CARE_PROVIDER_SITE_OTHER): Payer: Self-pay

## 2023-04-26 VITALS — BP 116/80 | HR 97 | Ht 70.0 in | Wt 266.0 lb

## 2023-04-26 DIAGNOSIS — M542 Cervicalgia: Secondary | ICD-10-CM | POA: Diagnosis not present

## 2023-04-26 NOTE — Progress Notes (Unsigned)
 Orthopedic Spine Surgery Office Note  Assessment: Patient is a 48 y.o. male with imbalance and decreased fine motor skill ability in his hands. He also reports radiating right arm pain. His MRI shows central stenosis at C3/4 and C5/6 with right sided foraminal stenosis at C5/6. He has myeloradiculopathy   Plan: -Given the nature of myelopathy, I recommended operative management. He is 48 years old with stenosis at C3/4 and C5/6. In order to try to decrease his chances of adjacent segment disease especially by addressed pathology at C3/4 and C5/6 with a skip level, discussed C3/4 and C5/6 CDA. If I cannot get in the CDA at C3/4 due to his chin or if it does not look like it is abutting the endplates well, will plan to convert that level to an ACDF. He does not have significant neck pain and he does not have significant disc height loss or facet arthropathy. I told him that his risks with surgery would be higher given his active nicotine use and elevated A1c. However, because of the nature of myelopathy, I still recommended surgery. After our discussion, patient wanted to proceed with surgery -Patient will next be seen at date of surgery    The patient has signs and symptoms consistent with cervical myeloradiculopathy. The most common natural history of cervical myelopathy was explained to the patient. Given this course, operative management in the form of C3/4 and C5/6 cervical disc replacement was recommended to the patient. The risks, including but not limited to pseudarthrosis, dysphagia, hematoma, airway compromise, recurrent laryngeal nerve injury, esophageal perforation, durotomy, spinal cord injury, nerve root injury, persistent pain, adjacent segment disease, infection, bleeding, hardware failure, vascular injury, heart attack, death, stroke, fracture, and need for additional procedures were discussed with the patient. The benefit of surgery would be preventing progression of the myelopathy and not  to reverse any myelopathic symptoms. Explained that he would likely notice improvement in his radiating right arm pain, but he may not get full relief of his pain with this surgery, especially any neck pain. The alternatives to surgical management would be continued monitoring, physical therapy, over-the-counter pain medications, ambulatory aids, and activity modification. I reiterated that these modalities would not change the natural history of the disease and that is why operative management has been recommended. All the patient's questions were answered to his satisfaction. After this discussion, the patient expressed understanding and elected to proceed with surgical intervention.     ___________________________________________________________________________   History:  Patient is a 48 y.o. male who presents today for cervical spine. Patient reports he first starting noticing symptoms about 1 year ago. He noticed difficulty  with using his hands. He felt clumsy. He was dropping objects. Over time, his ability to button shirts decreased. Currently, he cannot button a shirt and only will wear buttonless shirts. He has also noticed some unsteadiness with gait. He has not had any falls or has not had to use any ambulatory assistive devices but he feels less steady and confident on his feet. Starting in 10/2022, he noticed different symptoms. He noted onset of neck pain that radiated into his right upper extremity. He feels it going along the lateral aspect of the arm into the dorsal forearm. He does not feel it radiating past the wrist. He has no pain radiating into his left upper extremity. There was no trauma or injury that preceded the onset of pain.     Weakness: yes, his right arm feels weaker particularly in the biceps with repetitive tasks.  No other weakness noted Difficulty with fine motor skills (e.g., buttoning shirts, handwriting): yes, has noticed worsening function over time and is no longer  able to button shirts Symptoms of imbalance: yes, has felt more unsteady on his feet as of late Paresthesias and numbness: yes, has decreased sensation in bilateral hands distal to the wrist Bowel or bladder incontinence: denies Saddle anesthesia: denies  Treatments tried: PT, gabapentin, tylenol, intramuscular steroid injection  Review of systems: Denies fevers and chills, night sweats, unexplained weight loss, history of cancer. Has had pain that wakes him at night  Past medical history: CAD Depression DM (last A1c was 9.3 on 02/05/2023) HTN OSA Asthma  Allergies: lisinopril  Past surgical history:  Right carpal tunnel release  Social history: Reports use of nicotine product (smoking, vaping, patches, smokeless) Alcohol use: rare  Denies recreational drug use   Physical Exam:  BMI of 38.2  General: no acute distress, appears stated age Neurologic: alert, answering questions appropriately, following commands Respiratory: unlabored breathing on room air, symmetric chest rise Psychiatric: appropriate affect, normal cadence to speech   MSK (spine):  -Strength exam      Left  Right Grip strength                5/5  5/5 Interosseus   5/5   5/5 Wrist extension  5/5  5/5 Wrist flexion   5/5  5/5 Elbow flexion   5/5  4+/5 Deltoid    5/5  5/5  EHL    5/5  5/5 TA    5/5  5/5 GSC    5/5  5/5 Knee extension  5/5  5/5 Hip flexion   5/5  5/5  -Sensory exam    Sensation intact to light touch in L3-S1 nerve distributions of bilateral lower extremities  Sensation intact to light touch in C5-T1 nerve distributions of bilateral upper extremities  -Brachioradialis DTR: 3/4 on the left, 2/4 on the right -Biceps DTR: 3/4 on the left, 2/4 on the right -Triceps DTR: 2/4 on the left, 2/4 on the right -Triceps DTR: 2/4 on the left, 2/4 on the right -Achilles DTR: 2/4 on the left, 2/4 on the right -Patellar tendon DTR: 3/4 on the left, 3/4 on the right  -Spurling: negative  bilaterally  -Hoffman sign: positive on the left, negative on the right -Clonus: no beats bilaterally -Interosseous wasting: none seen -Grip and release test: positive  -Romberg: negative -Gait: normal  Left shoulder exam: no pain through range of motion, negative jobe, no weakness with external rotation with arm at side, negative belly press Right shoulder exam: no pain through range of motion, negative jobe, no weakness with external rotation with arm at side, negative belly press  Tinel's at wrist: negative bilaterally  Phalen's at wrist: negative bilaterally  Durkan's: negative bilaterally  Tinel's at elbow: negative bilaterally  Imaging: XRs of the cervical spine from 04/26/2023 were independently reviewed and interpreted, showing disc height loss at C3/4. No other significant degenerative changes. No fracture or dislocation seen. No evidence of instability on flexion/extension views. Less than 50% disc height loss at C3/4 and C5/6 when compared to other levels. No significant facet arthropathy.   MRI of the cervical spine from 03/25/2023 was independently reviewed and interpreted, showing disc bulge with central stenosis and T2 cord signal change at C3/4. There is foraminal stenosis on the left at C3/4. Central and foraminal stenosis on the right at C5/6.    Patient name: Jordan Sparks Patient MRN: [id number] Date  of visit: 04/26/23   Pre-operative Scores  mJOA: 13 VAS neck: 5/10 Vas arm: 9/10

## 2023-04-30 ENCOUNTER — Telehealth: Payer: Self-pay | Admitting: *Deleted

## 2023-04-30 NOTE — Telephone Encounter (Signed)
   Pre-operative Risk Assessment    Patient Name: Jordan Sparks  DOB: [birth year] MRN: [id number]   Date of last office visit: 04/12/23 Marjie Skiff, PAC Date of next office visit: 05/31/23 Marjie Skiff, [facility]   Request for Surgical Clearance    Procedure:   C3-4, C5-6 CERVICAL DISK ARTHROPLASTY  Date of Surgery:  Clearance TBD                                Surgeon:  DR. Willia Craze Surgeon's Group or Practice Name:  [facility] Phone number:  [phone] Fax number:  [phone] ATTN: APRIL   Type of Clearance Requested:   - Medical  - Pharmacy:  Hold Aspirin x 7 DAYS PRIOR   Type of Anesthesia:  General    Additional requests/questions:    [name]   04/30/2023, 11:12 AM

## 2023-05-01 NOTE — Telephone Encounter (Signed)
 I will update all parties involved pt has appt 05/31/23 with Marjie Skiff, PAC, clearance to assessed at appt per preop APP.

## 2023-05-01 NOTE — Telephone Encounter (Signed)
   Name: Jordan Sparks  DOB: June 26, 1975  MRN: [id number]  Primary Cardiologist: Parke Poisson, MD  Chart reviewed as part of pre-operative protocol coverage. Because of Jordan Sparks's past medical history and time since last visit, he will require a follow-up in-office visit in order to better assess preoperative cardiovascular risk.  Patient has an office visit scheduled on 05/31/2023 with Marjie Skiff, PA-C. Appointment notes have been updated to reflect need for pre-op evaluation.   Pre-op covering staff:  - Please contact requesting surgeon's office via preferred method (i.e, phone, fax) to inform them of need for appointment prior to surgery.   Carlos Levering, NP  05/01/2023, 11:56 AM

## 2023-05-03 ENCOUNTER — Ambulatory Visit (HOSPITAL_COMMUNITY): Payer: Medicaid Other | Admitting: Licensed Clinical Social Worker

## 2023-05-03 ENCOUNTER — Telehealth (HOSPITAL_COMMUNITY): Payer: Self-pay | Admitting: Licensed Clinical Social Worker
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
## 2023-05-03 NOTE — Telephone Encounter (Signed)
 LCSW sent to links the patient's phone number listed in epic with no response at 11 and 1105.  LCSW followed up with a phone call at 11:12 PM no answer and voicemail box full.  Patient will be marked as a no-show for today's appointment.  LCSW waited until 1115 before disconnecting from virtual appointment.

## 2023-05-04 ENCOUNTER — Other Ambulatory Visit: Payer: Self-pay | Admitting: Pharmacist

## 2023-05-04 ENCOUNTER — Encounter (HOSPITAL_COMMUNITY): Payer: Self-pay | Admitting: Psychiatry

## 2023-05-04 ENCOUNTER — Other Ambulatory Visit (HOSPITAL_COMMUNITY): Payer: Self-pay

## 2023-05-04 ENCOUNTER — Ambulatory Visit (HOSPITAL_COMMUNITY): Payer: Medicaid Other | Admitting: Psychiatry

## 2023-05-04 ENCOUNTER — Telehealth: Payer: Self-pay | Admitting: Pharmacist

## 2023-05-04 DIAGNOSIS — F3162 Bipolar disorder, current episode mixed, moderate: Secondary | ICD-10-CM

## 2023-05-04 MED ORDER — CARVEDILOL 25 MG PO TABS
25.0000 mg | ORAL_TABLET | Freq: Two times a day (BID) | ORAL | 3 refills | Status: DC
  Filled 2023-05-04: qty 60, 30d supply, fill #0
  Filled 2023-06-04: qty 60, 30d supply, fill #1
  Filled 2023-07-14: qty 60, 30d supply, fill #2
  Filled 2023-08-18: qty 60, 30d supply, fill #3

## 2023-05-04 NOTE — Telephone Encounter (Signed)
 He should be on Coreg 25mg  twice daily. Marcelino Duster, can you please notify patient and send in a new prescription?  Thank you! Calie

## 2023-05-04 NOTE — Patient Instructions (Signed)

## 2023-05-04 NOTE — Telephone Encounter (Signed)
 Appointment - Patient left a message he was calling back to set up an evaluation appointment but would need this on a Thursday or Friday. Patient was scheduled with Dr. Josephina Shih today but appears to have no showed.

## 2023-05-04 NOTE — Telephone Encounter (Signed)
 Pt informed of providers result & recommendations. Pt verbalized understanding. All questions, if any, were answered. He would like his Coreg 25mg  twice daily sent to [facility].

## 2023-05-04 NOTE — Progress Notes (Signed)
   05/04/2023  Patient ID: Mercie Eon, male   DOB: 1975-04-08, 48 y.o.   MRN: [id number]  Called and spoke with the patient on the phone. Appears he has all of the medications he needs currently.  Running low on Farxiga and Ability. Advised those will be refilled when due again, which Marcelline Deist is due around 05/11/23. Confirmed understanding and thanked for the check-in. No further concerns at this time. Will set task to review medication fill history again in 2 months to ensure adherence.  Of note, appears patient is likely taking Carvedilol 12.5mg  twice a day instead of 25mg . Last sold on 04/12/23 for 15 DS due to script being sent 30 tablets for each fill. Will send note to Cardiologist to fix to 90DS and contact patient to discuss what he should be taking.     Marlowe Aschoff, PharmD [facility] Phone Number: [phone]

## 2023-05-08 ENCOUNTER — Encounter (HOSPITAL_BASED_OUTPATIENT_CLINIC_OR_DEPARTMENT_OTHER): Payer: Self-pay | Admitting: *Deleted

## 2023-05-09 ENCOUNTER — Encounter: Payer: Self-pay | Admitting: Family Medicine

## 2023-05-10 ENCOUNTER — Ambulatory Visit (HOSPITAL_BASED_OUTPATIENT_CLINIC_OR_DEPARTMENT_OTHER): Payer: Medicaid Other | Admitting: Family Medicine

## 2023-05-14 ENCOUNTER — Telehealth (HOSPITAL_BASED_OUTPATIENT_CLINIC_OR_DEPARTMENT_OTHER): Payer: Self-pay | Admitting: *Deleted

## 2023-05-14 NOTE — Telephone Encounter (Signed)
 Sent letter to [facility] per patient this is where eye exam was completed received notification eye exam was not done there only glasses purchased

## 2023-05-17 ENCOUNTER — Encounter (HOSPITAL_BASED_OUTPATIENT_CLINIC_OR_DEPARTMENT_OTHER): Payer: Self-pay | Admitting: *Deleted

## 2023-05-22 ENCOUNTER — Other Ambulatory Visit: Payer: Self-pay

## 2023-05-22 NOTE — Progress Notes (Deleted)
 Cardiology Office Note:    Date:  05/22/2023   ID:  Jordan Sparks, DOB Oct 18, 1975, MRN [id number]  PCP:  de [place], Raymond J, MD  Cardiologist:  Parke Poisson, MD { Click to update primary MD,subspecialty MD or APP then REFRESH:1}    Referring MD: de Peru, Buren Kos, MD   Chief Complaint: follow-up of palpitations  History of Present Illness:    Jordan Sparks is a 48 y.o. male with a history of mild non-obstructive CAD noted on coronary CTA in 05/2021, non-ischemic cardiomyopathy/ chronic systolic CHF with EF as low as 25-30% in 05/2021 but normalized to 55% on Echo in 08/2021, uncontrolled hypertension, hyperlipidemia, type 2 diabetes mellitus, obstructive sleep apnea on CPAP, congenital spinal stenosis, anxiety/depression, mood disorder, obesity, polysubstance abuse (including prior cocaine use), and medication non-compliance who is followed by Dr. Jacques Navy and presents today for follow up of palpitations.  Patient was admitted in 05/2021 after presenting with chest pain and shortness of breath. High-sensitivity troponin was minimally elevated and flat in the 20s. Echo showed LVEF of 25-30% with global hypokinesis and moderate LVH as well as normal RV and mild MR. Coronary CTA showed a coronary calcium score of 24 with only minimal soft plaque in the mid LAD. Cardiomyopathy felt to be due to longstanding uncontrolled BP. Renal artery ultrasound showed 1-59% stenosis of bilateral renal arteries. He was started on GDMT. Unfortunately, medication compliance has been an issue. However, repeat Echo in 08/2021 showed improvement of LVEF to 55% with moderate LVH, grade 2 diastolic dysfunction, and only trivial MR.   Patient was seen by me on 03/20/2023 after not having been seen since 11/2021 at which time he was doing well from a cardiac standpoint other than his BP being uncontrolled. He had had issues with affording medications. He was restarted on multiple medications.   He was  last see by me on 04/12/2023 at which time he reported palpitations that he described as a "fluttering" sensation. He reported associated chest discomfort with this. He did note some chest discomfort outside of episodes of palpitations but they were mostly related. He denied any exertional chest pain. EKG showed no acute ischemic changes. Coreg was increased and Zio monitor was ordered.  Patient presents today for follow-up. ***  Palpitations Patient reported intermittent palpitations that he described as "fluttering" with associated chest pressure at last visit in 03/2023. Beta-blocker was increased and Zio monitor was ordered. Monitor showed *** - *** - Continue Coreg to 25mg  twice daily.   Chest Pain Non-Obstructive CAD Coronary CTA in 05/2021 showed a coronary calcium score of 24 with only minimal soft plaque in the mid LAD. - *** - Continue aspirin and statin.   Non-Ischemic Cardiomyopathy Chronic Systolic CHF Initially diagnosed in 05/2021 during admission for acute CHF. Echo at that time showed LVEF of 25-30% with global hypokinesis and moderate LVH as well as normal RV and mild MR. Coronary CTA showed only minimal CAD. Cardiomyopathy was felt to be due to uncontrolled hypertension. Repeat Echo in 08/2021 after being started on GDMT showed LVEF of 55% with no regional wall motion abnormalities, moderate LVH, grade 2 diastolic dysfunction, and only trivial MR. - Euvolemic on exam.  - Continue Lasix 20mg  twice daily.  - Continue Entresto 97-103mg  twice daily.  - Continue Coreg 25mg  twice daily. - Continue Eplerenone 25mg  daily. - Continue Farxiga 10mg  daily.  - Continue Hydralazine 25mg  three time daily. - Continue daily weights and sodium/fluid restrictions.  - Will repeat  a BMET today. ***   Hypertension BP *** - Continue medications for CHF as above. Also on Amlodipine 10mg  daily.    Hyperlipidemia Lipid panel in 04/2021: Total Cholesterol 189, Triglycerides 106, HDL 54, LDL 116.  LDL goal <70 given CAD. - Continue Crestor 20mg  daily.  - He is overdue for repeat labs. Will check  lipid panel and LFTs. ***   Type 2 Diabetes Mellitus Hemoglobin A1c 9.3% in 01/2023. - On Metformin and Farxiga. - Management per PCP.   Obstructive Sleep Apnea - Continue CPAP.  Pre-Op Evaluation Patient has upcoming cervical disk arthroplasty planned. ***  EKGs/Labs/Other Studies Reviewed:    The following studies were reviewed:  Echocardiogram 05/31/2021: Impressions:  1. Left ventricular ejection fraction, by estimation, is 25 to 30%. The  left ventricle has severely decreased function. The left ventricle  demonstrates global hypokinesis. There is moderate concentric left  ventricular hypertrophy. Indeterminate  diastolic filling due to E-A fusion.   2. Right ventricular systolic function is normal. The right ventricular  size is normal. Tricuspid regurgitation signal is inadequate for assessing  PA pressure.   3. The mitral valve is grossly normal. Mild mitral valve regurgitation.  No evidence of mitral stenosis.   4. The aortic valve is tricuspid. Aortic valve regurgitation is not  visualized. No aortic stenosis is present.   5. The inferior vena cava is dilated in size with <50% respiratory  variability, suggesting right atrial pressure of 15 mmHg.  _______________   Renal Artery Ultrasound 06/01/2021: Summary:  Renal:  - Right: 1-59% stenosis of the right renal artery. Normal right resistive Index. Normal size right kidney.  - Left:  1-59% stenosis of the left renal artery. Normal left resistive Index. Normal size of left kidney.    Mesenteric:  Areas of limited visceral study include mesenteric arteries and left renal  artery.  _______________   Coronary CTA 06/01/2021: Impressions: 1. Coronary calcium score of 24. Elevated based on age, race, and gender. 2. Normal coronary origin with left dominance. 3. CAD-RADS 1. Minimal non-obstructive CAD (1-24%).  Consider non-atherosclerotic causes of chest pain. Consider preventive therapy and risk factor modification. _______________   Echocardiogram 09/09/2021: Impressions:  1. Left ventricular ejection fraction, by estimation, is 55%. The left  ventricle has normal function. The left ventricle has no regional wall  motion abnormalities. There is moderate concentric left ventricular  hypertrophy. Left ventricular diastolic  parameters are consistent with Grade II diastolic dysfunction  (pseudonormalization).   2. Right ventricular systolic function is normal. The right ventricular  size is normal.   3. Left atrial size was mild to moderately dilated.   4. The mitral valve is normal in structure. Trivial mitral valve  regurgitation. No evidence of mitral stenosis.   5. The aortic valve is normal in structure. Aortic valve regurgitation is  not visualized. No aortic stenosis is present.   6. Aortic dilatation noted. There is borderline dilatation of the  ascending aorta, measuring 38 mm.   7. The inferior vena cava is normal in size with greater than 50%  respiratory variability, suggesting right atrial pressure of 3 mmHg. _______________  Monitor ***  EKG:  EKG not ordered today.  Recent Labs: 09/07/2022: ALT 24; Hemoglobin 13.8; Platelets 274 04/12/2023: BUN 16; Creatinine, Ser 0.79; Magnesium 1.7; Potassium 3.9; Sodium 138; TSH 0.279  Recent Lipid Panel    Component Value Date/Time   CHOL 189 05/04/2021 0953   TRIG 106 05/04/2021 0953   HDL 54 05/04/2021 0953   CHOLHDL  3.5 05/04/2021 0953   CHOLHDL 5.2 02/09/2018 0221   VLDL 37 02/09/2018 0221   LDLCALC 116 (H) 05/04/2021 0953    Physical Exam:    Vital Signs: There were no vitals taken for this visit.    Wt Readings from Last 3 Encounters:  04/26/23 266 lb (120.7 kg)  04/12/23 266 lb (120.7 kg)  03/20/23 269 lb (122 kg)     General: 49 y.o. male in no acute distress. HEENT: Normocephalic and atraumatic. Sclera clear.   Neck: Supple. No carotid bruits. No JVD. Heart: *** RRR. Distinct S1 and S2. No murmurs, gallops, or rubs.  Lungs: No increased work of breathing. Clear to ausculation bilaterally. No wheezes, rhonchi, or rales.  Abdomen: Soft, non-distended, and non-tender to palpation.  Extremities: No lower extremity edema.  Radial and distal pedal pulses 2+ and equal bilaterally. Skin: Warm and dry. Neuro: No focal deficits. Psych: Normal affect. Responds appropriately.   Assessment:    No diagnosis found.  Plan:     Disposition: Follow up in ***   Signed, Corrin Parker, PA-C  05/22/2023 7:27 PM    [facility]

## 2023-05-24 ENCOUNTER — Other Ambulatory Visit (HOSPITAL_COMMUNITY): Payer: Self-pay

## 2023-05-24 MED ORDER — SACUBITRIL-VALSARTAN 97-103 MG PO TABS
1.0000 | ORAL_TABLET | Freq: Two times a day (BID) | ORAL | 1 refills | Status: DC
  Filled 2023-05-24 – 2023-05-25 (×3): qty 90, 45d supply, fill #0
  Filled 2023-07-14: qty 90, 45d supply, fill #1

## 2023-05-25 ENCOUNTER — Other Ambulatory Visit (HOSPITAL_COMMUNITY): Payer: Self-pay

## 2023-05-29 ENCOUNTER — Ambulatory Visit (HOSPITAL_BASED_OUTPATIENT_CLINIC_OR_DEPARTMENT_OTHER): Admitting: Family Medicine

## 2023-05-31 ENCOUNTER — Ambulatory Visit: Payer: Medicaid Other | Attending: Student | Admitting: Student

## 2023-06-04 ENCOUNTER — Other Ambulatory Visit (HOSPITAL_BASED_OUTPATIENT_CLINIC_OR_DEPARTMENT_OTHER): Payer: Self-pay | Admitting: Family Medicine

## 2023-06-04 ENCOUNTER — Other Ambulatory Visit (HOSPITAL_COMMUNITY): Payer: Self-pay

## 2023-06-05 ENCOUNTER — Encounter (HOSPITAL_COMMUNITY): Payer: Self-pay | Admitting: Licensed Clinical Social Worker

## 2023-06-05 ENCOUNTER — Ambulatory Visit (HOSPITAL_COMMUNITY): Payer: Medicaid Other | Admitting: Licensed Clinical Social Worker

## 2023-06-05 ENCOUNTER — Other Ambulatory Visit: Payer: Self-pay

## 2023-06-05 DIAGNOSIS — F3162 Bipolar disorder, current episode mixed, moderate: Secondary | ICD-10-CM

## 2023-06-05 NOTE — Progress Notes (Addendum)
 Comprehensive Clinical Assessment (CCA) Note  06/05/2023 Jordan Sparks [id number]  Chief Complaint:  Chief Complaint  Patient presents with   Depression    Bipolar depression    Visit Diagnosis: Bipolar 1 disorder    Virtual Visit via Video Note  I connected with Jordan Sparks on 06/11/23 at 10:00 AM EDT by a video enabled telemedicine application and verified that I am speaking with the correct person using two identifiers.  Location: Patient: [employer]  Provider: Providers Home    I discussed the limitations of evaluation and management by telemedicine and the availability of in person appointments. The patient expressed understanding and agreed to proceed.  History of Present Illness:    Observations/Objective:   Assessment and Plan:   Follow Up Instructions:    I discussed the assessment and treatment plan with the patient. The patient was provided an opportunity to ask questions and all were answered. The patient agreed with the plan and demonstrated an understanding of the instructions.   The patient was advised to call back or seek an in-person evaluation if the symptoms worsen or if the condition fails to improve as anticipated.  I provided 45 minutes of non-face-to-face time during this encounter.   Juliene GORMAN Patee, LCSW   Client is a 48 year old  male. Client is referred by  self for a bipolar 1 disorder.   Client states mental health symptoms as evidenced by:    Depression Change in energy/activity; Fatigue; Irritability; Difficulty Concentrating Change in energy/activity; Fatigue; Irritability; Difficulty Concentrating  Duration of Depressive Symptoms Greater than two weeks Greater than two weeks  Mania Irritability; Racing thoughts; Recklessness; Change in energy/activity Irritability; Racing thoughts; Recklessness; Change in energy/activity  Anxiety Worrying; Tension; Sleep; Restlessness; Irritability Worrying; Tension;  Sleep; Restlessness; Irritability  Psychosis NonePsychosis. None. Has comment. Taken on 06/05/23 1007 NonePsychosis. None. Has comment. Last Filed Value  Trauma None None  Obsessions None None  Compulsions None None  Inattention None None  Hyperactivity/Impulsivity None None  Oppositional/Defiant Behaviors Angry; Resentful; TemperOppositional/Defiant Behaviors. Angry; Resentful; Temper. Has comment. Taken on 06/05/23 1007 Angry; Resentful; TemperOppositional/Defiant Behaviors. Angry; Resentful; Temper. Has comment. Last Filed Value    Client denies suicidal and homicidal ideations at this time Client denies hallucinations and delusions at this time   Client was screened for the following SDOH: Smoking, financials, food, exercise, stress\tension, social interaction, and housing  Assessment Information that integrates subjective and objective details with a therapist's professional interpretation:    Ancelmo was pleasant, cooperative, maintained good eye contact.  He engaged well in comprehensive clinical assessment and was dressed casually.  Patient presented today with euthymic mood\affect.  Laroy was alert and oriented x 5.  Patient comes in today stating that he has significant history of bipolar 1 disorder.  He reports prior to being stabilized on medications he was irritable, restless, had insomnia, mood swings, and anger outburst.  Patient reports since taking medications all symptoms have decreased.  She also reports prior to taking medications auditory hallucinations.  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  Patient reports at this time he would like to continue on with medication management team at [facility].  Mahin declined therapeutic services due to feeling stabilized with medication management.  LCSW provided patient information for  front desk staff if he changed his mind in regards to therapeutic services.  Client states use of the following substances: Patient reports he utilizes CBD from [facility] for in [place]  multiple times per week for pain and anxiety.      Client was in agreement with treatment recommendations.   CCA Screening, Triage and Referral (STR)  Patient Reported Information Referral name: Self   Whom do you see for routine medical problems? Primary Care  Name of Contact: de Peru, Quintin PARAS, MD  How Long Has This Been Causing You Problems? > than 6 months  What Do You Feel Would Help You the Most Today? Medication(s)   Have You Recently Been in Any Inpatient Treatment (Hospital/Detox/Crisis Center/28-Day Program)? No   Have You Ever Received Services From [facility] Before? Yes  Who Do You See at [facility]? Multiple services   Have You Recently Had Any Thoughts About Hurting Yourself? No  Are You Planning to Commit Suicide/Harm Yourself At This time? No   Have you Recently Had Thoughts About Hurting Someone Sherral? No   Have You Used Any Alcohol or Drugs in the Past 24 Hours? No  Do You Currently Have a Therapist/Psychiatrist? Yes  Name of Therapist/Psychiatrist: Mercy Lauraine LABOR MD   Have You Been Recently Discharged From Any Office Practice or Programs? No   CCA Screening Triage Referral Assessment Type of Contact: Tele-Assessment  Is this Initial or Reassessment? Initial Assessment  Date Telepsych consult ordered in [facility]:  06/05/23  Time Telepsych consult ordered in [facility]:  1006  Is CPS involved or ever been involved? Never  Is APS involved or ever been involved? Never   Patient Determined To Be At Risk for Harm To Self or Others Based on Review of Patient Reported Information or Presenting Complaint? No  Method: No Plan  Availability of Means: No access or NA  Intent: Vague intent or NA  Notification Required: No need or identified  person  Are There Guns or Other Weapons in Your Home? No  Location of Assessment: Other (comment) (Virtual appt pt is at their home)  [place] of Residence: [place]  Patient Currently Receiving the Following Services: Medication Management   CCA Biopsychosocial Intake/Chief Complaint:  Pt reports bipolar 1 disorder  Current Symptoms/Problems: anger, tension, worry, worthlessness, restentfulness, hoplessness. Pt does not currenbtly reports these syptoms as he is taking medication but reports at time of scheduling these were all things he was dealing with   Patient Reported Schizophrenia/Schizoaffective Diagnosis in Past: No   Strengths: willing to  engage in psych treatment  Preferences: none reported  Abilities: none reported   Type of Services Patient Feels are Needed: medication mgnt   Initial Clinical Notes/Concerns: none today   Mental Health Symptoms Depression:  Change in energy/activity; Fatigue; Irritability; Difficulty Concentrating   Duration of Depressive symptoms: Greater than two weeks   Mania:  Irritability; Racing thoughts; Recklessness; Change in energy/activity   Anxiety:   Worrying; Tension; Sleep; Restlessness; Irritability   Psychosis:  None (Hx of AH when not taking medications)   Duration of Psychotic symptoms: No data recorded  Trauma:  None   Obsessions:  None   Compulsions:  None   Inattention:  None   Hyperactivity/Impulsivity:  None   Oppositional/Defiant Behaviors:  Angry; Resentful; Temper (Pt reports these syptoms are stable if medication are being admnistered as directed)   Emotional Irregularity:  No data recorded  Other Mood/Personality Symptoms:  No data recorded   Mental Status Exam Appearance and self-care  Stature:  Average   Weight:  Overweight   Clothing:  Casual   Grooming:  Normal   Cosmetic use:  None   Posture/gait:  Normal   Motor activity:  Not Remarkable   Sensorium  Attention:  Normal    Concentration:  Normal   Orientation:  X5   Recall/memory:  Normal   Affect and Mood  Affect:  Appropriate   Mood:  Euthymic   Relating  Eye contact:  Normal   Facial expression:  Responsive   Attitude toward examiner:  Cooperative   Thought and Language  Speech flow: Clear and Coherent   Thought content:  Appropriate to Mood and Circumstances   Preoccupation:  None   Hallucinations:  None   Organization:  No data recorded  [employer] of Knowledge:  Fair   Intelligence:  Average   Abstraction:  Normal   Judgement:  Fair   Dance movement psychotherapist:  Adequate   Insight:  Fair   Decision Making:  Normal   Social Functioning  Social Maturity:  No data recorded  Social Judgement:  Normal   Stress  Stressors:  Office manager Ability:  Normal   Skill Deficits:  Interpersonal   Supports:  Church; Family; Friends/Service system     Religion: Religion/Spirituality Are You A Religious Person?: No  Leisure/Recreation: Leisure / Recreation Do You Have Hobbies?: No  Exercise/Diet: Exercise/Diet Do You Exercise?: No Have You Gained or Lost A Significant Amount of Weight in the Past Six Months?: No Do You Follow a Special Diet?: No Do You Have Any Trouble Sleeping?: No   CCA Employment/Education Employment/Work Situation: Employment / Work Situation Employment Situation: Employed Where is Patient Currently Employed?: [employer] Long has Patient Been Employed?: 3 months Are You Satisfied With Your Job?: Yes Do You Work More Than One Job?: No Work Stressors: none reported Patient's Job has Been Impacted by Current Illness: No What is the Longest Time Patient has Held a Job?: 3 year Where was the Patient Employed at that Time?: working for self as a Geophysical data processor Has Patient ever Been in Equities trader?: No  Education: Education Is Patient Currently Attending School?: No Last Grade Completed: 12 Did Garment/textile technologist From [employer]?: Yes Did Theme park manager?: Yes What Type of College Degree Do you Have?: Higher education careers adviser and production associates degree Did You Attend Graduate School?: No Did You Have An Individualized Education Program (IIEP): No Did You Have Any Difficulty At [employer]?: No Patient's Education Has Been Impacted by Current Illness: No   CCA Family/Childhood History Family and Relationship History: Family history Marital status: Married Number of Years Married: 13 What types of issues is patient dealing with in the relationship?: none right now Are you sexually active?: Yes What is your sexual orientation?: hetrsoexual Does patient have children?: Yes How many children?: 2 How is patient's relationship with their children?: son good. Daughter bad  Childhood History:  Childhood History By whom was/is the patient raised?: Grandparents Additional childhood history information: Pt raised by grandmother Description of patient's relationship with caregiver when they were a child: grandmother: Bumpy and pt was an angry child after his mother passed away Patient's description of current relationship with people who raised him/her: Grandmother: She passed away as an adult by pt reports relationship got better as she was older Does patient have siblings?: Yes Number of Siblings: 2 Description of patient's current relationship with siblings: close to all of his brothers Did patient suffer any verbal/emotional/physical/sexual abuse as a child?: No Did patient suffer from severe childhood neglect?:  No Has patient ever been sexually abused/assaulted/raped as an adolescent or adult?: No Was the patient ever a victim of a crime or a disaster?: No Witnessed domestic violence?: No Has patient been affected by domestic violence as an adult?: No  Child/Adolescent Assessment:    DSM5 Diagnoses: Patient Active Problem List   Diagnosis Date Noted   Thickened nails 02/05/2023   Trigger thumb, right thumb  04/18/2022   Bipolar 1 disorder, mixed, moderate (HCC) 12/06/2021   Insomnia 12/06/2021   Encounter for long-term (current) use of medications 12/06/2021   Unilateral primary osteoarthritis, right hip 11/30/2021   Carpal tunnel syndrome, right upper limb 11/30/2021   Congestive heart failure (CHF) (HCC) 11/10/2021   Low back pain 10/11/2021   Peripheral neuropathy 10/11/2021   Pain in left shoulder 08/31/2021   Spinal stenosis of cervical region 06/09/2021   Nonischemic cardiomyopathy (HCC) 06/06/2021   Chest pain 06/01/2021   Chest pain of uncertain etiology 05/31/2021   Obesity, Class III, BMI 40-49.9 (morbid obesity) (HCC) 05/31/2021   Diabetes mellitus (HCC) 05/04/2021   Sleep apnea 05/04/2021   Hypertensive urgency 10/19/2020   GERD (gastroesophageal reflux disease) 02/09/2018   Hyperglycemia 02/08/2018   Atypical chest pain 02/08/2018   Hypokalemia 02/08/2018   Leukocytosis 02/08/2018   Nausea and vomiting 02/08/2018   Mood disorder (HCC)    Hypertension    Anxiety    Hyperglycemia without ketosis    Manic disorder (HCC)    Anosmia 10/26/2015   Chronic pansinusitis 10/26/2015   Laryngopharyngeal reflux (LPR) 10/26/2015   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   Nasal turbinate hypertrophy 10/26/2015   Smokes 10/26/2015   Snoring 10/26/2015      Referrals to Alternative Service(s): Referred to Alternative Service(s):   Place:   Date:   Time:    Referred to Alternative Service(s):   Place:   Date:   Time:    Referred to Alternative Service(s):   Place:   Date:   Time:    Referred to Alternative Service(s):   Place:   Date:   Time:      Collaboration of Care: Other patient denied therapy referral as medications are stabilizing mood as long as they are taking them consistently and as prescribed.  Patient/Guardian was advised Release of Information must be obtained prior to any record release in order to collaborate their care with an outside provider. Patient/Guardian was  advised if they have not already done so to contact the registration department to sign all necessary forms in order for us  to release information regarding their care.   Consent: Patient/Guardian gives verbal consent for treatment and assignment of benefits for services provided during this visit. Patient/Guardian expressed understanding and agreed to proceed.   Adriell Polansky S Adaiah Jaskot, LCSW

## 2023-06-16 ENCOUNTER — Other Ambulatory Visit (HOSPITAL_COMMUNITY): Payer: Self-pay

## 2023-06-16 ENCOUNTER — Other Ambulatory Visit (HOSPITAL_BASED_OUTPATIENT_CLINIC_OR_DEPARTMENT_OTHER): Payer: Self-pay | Admitting: Family Medicine

## 2023-06-19 ENCOUNTER — Other Ambulatory Visit: Payer: Self-pay

## 2023-06-21 ENCOUNTER — Ambulatory Visit (HOSPITAL_BASED_OUTPATIENT_CLINIC_OR_DEPARTMENT_OTHER): Admitting: Family Medicine

## 2023-06-21 ENCOUNTER — Encounter (HOSPITAL_BASED_OUTPATIENT_CLINIC_OR_DEPARTMENT_OTHER): Payer: Self-pay | Admitting: Family Medicine

## 2023-06-21 ENCOUNTER — Other Ambulatory Visit: Payer: Self-pay

## 2023-06-21 ENCOUNTER — Encounter (HOSPITAL_BASED_OUTPATIENT_CLINIC_OR_DEPARTMENT_OTHER): Payer: Self-pay

## 2023-06-21 VITALS — BP 148/101 | HR 85 | Ht 70.0 in | Wt 277.7 lb

## 2023-06-21 DIAGNOSIS — R7989 Other specified abnormal findings of blood chemistry: Secondary | ICD-10-CM | POA: Diagnosis not present

## 2023-06-21 DIAGNOSIS — E1165 Type 2 diabetes mellitus with hyperglycemia: Secondary | ICD-10-CM

## 2023-06-21 DIAGNOSIS — Z1211 Encounter for screening for malignant neoplasm of colon: Secondary | ICD-10-CM

## 2023-06-21 DIAGNOSIS — Z7985 Long-term (current) use of injectable non-insulin antidiabetic drugs: Secondary | ICD-10-CM

## 2023-06-21 MED ORDER — FLUTICASONE PROPIONATE 50 MCG/ACT NA SUSP
2.0000 | Freq: Every day | NASAL | 1 refills | Status: DC | PRN
  Filled 2023-06-21: qty 16, 30d supply, fill #0
  Filled 2023-07-30: qty 16, 30d supply, fill #1
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
## 2023-06-21 NOTE — Progress Notes (Signed)
    Procedures performed today:    None.  Independent interpretation of notes and tests performed by another provider:   None.  Brief History, Exam, Impression, and Recommendations:    BP (!) 148/101 (BP Location: Right Arm, Patient Position: Sitting, Cuff Size: Normal)   Pulse 85   Ht 5\' 10"  (1.778 m)   Wt 277 lb 11.2 oz (126 kg)   SpO2 100%   BMI 39.85 kg/m   Type 2 diabetes mellitus with hyperglycemia, without long-term current use of insulin (HCC) Assessment & Plan: Previously, patient was not taking all of his medications due to cost.  Previously was around the holiday season and he indicated that he did not have money to be able for medication as well as gifts for individuals.  He indicates that now he is taking metformin as well as [place] related to diabetes, Marcelline Deist also taken related to his underlying heart conditions/heart failure. Previous A1c has not been well-controlled.  He is due for recheck today, we will check hemoglobin A1c today.  If continuing to be not at goal, will need to adjust medications, consider evaluation with endocrinologist  Orders: -     Hemoglobin A1c  Special screening for malignant neoplasms, colon -     Cologuard  Low TSH level Assessment & Plan: Had labs completed with cardiology and found to have slightly low TSH.  Recommend proceeding with additional thyroid labs as below for further assessment.  Further recommendations related to additional testing or treatment pending results of labs  Orders: -     TSH + free T4 -     T3  Other orders -     Fluticasone Propionate; Place 2 sprays into both nostrils daily as needed for allergies.  Dispense: 16 g; Refill: 1  Return in about 4 months (around 10/21/2023).   ___________________________________________ Benuel Ly de Peru, MD, ABFM, CAQSM Primary Care and Sports Medicine [facility]

## 2023-06-21 NOTE — Assessment & Plan Note (Signed)
 Had labs completed with cardiology and found to have slightly low TSH.  Recommend proceeding with additional thyroid labs as below for further assessment.  Further recommendations related to additional testing or treatment pending results of labs

## 2023-06-21 NOTE — Assessment & Plan Note (Signed)
 Previously, patient was not taking all of his medications due to cost.  Previously was around the holiday season and he indicated that he did not have money to be able for medication as well as gifts for individuals.  He indicates that now he is taking metformin as well as [place] related to diabetes, Marcelline Deist also taken related to his underlying heart conditions/heart failure. Previous A1c has not been well-controlled.  He is due for recheck today, we will check hemoglobin A1c today.  If continuing to be not at goal, will need to adjust medications, consider evaluation with endocrinologist

## 2023-06-21 NOTE — Patient Instructions (Signed)
   Medication Instructions:  Your physician recommends that you continue on your current medications as directed. Please refer to the Current Medication list given to you today. --If you need a refill on any your medications before your next appointment, please call your pharmacy first. If no refills are authorized on file call the office.-- Lab Work: Your physician has recommended that you have lab work today: today If you have labs (blood work) drawn today and your tests are completely normal, you will receive your results via MyChart message OR a phone call from our staff.  Please ensure you check your voicemail in the event that you authorized detailed messages to be left on a delegated number. If you have any lab test that is abnormal or we need to change your treatment, we will call you to review the results.    Follow-Up: Your next appointment:   Your physician recommends that you schedule a follow-up appointment in: 3-4 month follow up  with Dr. de Peru  You will receive a text message or e-mail with a link to a survey about your care and experience with [place] today! We would greatly appreciate your feedback!   Thanks for letting us be apart of your health journey!!  Primary Care and Sports Medicine   Dr. Ceasar Mons Peru   We encourage you to activate your patient portal called "MyChart".  Sign up information is provided on this After Visit Summary.  MyChart is used to connect with patients for Virtual Visits (Telemedicine).  Patients are able to view lab/test results, encounter notes, upcoming appointments, etc.  Non-urgent messages can be sent to your provider as well. To learn more about what you can do with MyChart, please visit --  [email or web address].

## 2023-06-22 ENCOUNTER — Encounter (HOSPITAL_COMMUNITY): Payer: Self-pay | Admitting: Psychiatry

## 2023-06-22 ENCOUNTER — Other Ambulatory Visit (HOSPITAL_COMMUNITY): Payer: Self-pay

## 2023-06-22 ENCOUNTER — Other Ambulatory Visit: Payer: Self-pay

## 2023-06-22 ENCOUNTER — Telehealth (HOSPITAL_COMMUNITY): Payer: Medicaid Other | Admitting: Psychiatry

## 2023-06-22 DIAGNOSIS — F1491 Cocaine use, unspecified, in remission: Secondary | ICD-10-CM

## 2023-06-22 DIAGNOSIS — F1290 Cannabis use, unspecified, uncomplicated: Secondary | ICD-10-CM | POA: Diagnosis not present

## 2023-06-22 DIAGNOSIS — G4700 Insomnia, unspecified: Secondary | ICD-10-CM

## 2023-06-22 DIAGNOSIS — F3162 Bipolar disorder, current episode mixed, moderate: Secondary | ICD-10-CM | POA: Diagnosis not present

## 2023-06-22 LAB — TSH+FREE T4
Free T4: 1.47 ng/dL (ref 0.82–1.77)
TSH: 0.387 u[IU]/mL — ABNORMAL LOW (ref 0.450–4.500)

## 2023-06-22 LAB — HEMOGLOBIN A1C
Est. average glucose Bld gHb Est-mCnc: 197 mg/dL
Hgb A1c MFr Bld: 8.5 % — ABNORMAL HIGH (ref 4.8–5.6)

## 2023-06-22 LAB — T3: T3, Total: 93 ng/dL (ref 71–180)

## 2023-06-22 MED ORDER — BUPROPION HCL ER (XL) 150 MG PO TB24
150.0000 mg | ORAL_TABLET | ORAL | 1 refills | Status: DC
Start: 2023-06-22 — End: 2023-08-31
  Filled 2023-06-22 – 2023-07-14 (×2): qty 90, 90d supply, fill #0

## 2023-06-22 MED ORDER — DULOXETINE HCL 60 MG PO CPEP
60.0000 mg | ORAL_CAPSULE | Freq: Two times a day (BID) | ORAL | 1 refills | Status: DC
Start: 2023-06-22 — End: 2023-08-31
  Filled 2023-06-22 – 2023-07-14 (×2): qty 180, 90d supply, fill #0

## 2023-06-22 MED ORDER — ARIPIPRAZOLE 5 MG PO TABS
5.0000 mg | ORAL_TABLET | Freq: Every day | ORAL | 1 refills | Status: DC
  Filled 2023-06-22 – 2023-06-27 (×2): qty 30, 30d supply, fill #0
  Filled 2023-07-30: qty 30, 30d supply, fill #1

## 2023-06-22 MED ORDER — DIVALPROEX SODIUM ER 500 MG PO TB24
500.0000 mg | ORAL_TABLET | Freq: Every evening | ORAL | 2 refills | Status: DC
  Filled 2023-06-22: qty 30, 30d supply, fill #0
  Filled 2023-07-30: qty 30, 30d supply, fill #1

## 2023-06-22 NOTE — Patient Instructions (Signed)
 Thank you for attending your appointment today.  -- CHANGE formulations of Depakote to extended release 500 mg nightly (you will no longer take morning Depakote) -- Continue other medications as prescribed.  Please do not make any changes to medications without first discussing with your provider. If you are experiencing a psychiatric emergency, please call 911 or present to your nearest emergency department. Additional crisis, medication management, and therapy resources are included below.  [facility]  [address] [place], [place] [address] [phone] WALK-IN URGENT CARE 24/7 FOR ANYONE [address] [place], [place]  [phone] Fax: [phone] [email or web address] *Interpreters available *Accepts all insurance and uninsured for Urgent Care needs *Accepts Medicaid and uninsured for outpatient treatment (below)      ONLY FOR [facility]  Below:    Outpatient New Patient Assessment/Therapy Walk-ins:        Monday, Wednesday, and Thursday 8am until slots are full (first come, first served)                   New Patient Psychiatry/Medication Management        Monday-Friday 8am-11am (first come, first served)               For all walk-ins we ask that you arrive by 7:15am, because patients will be seen in the order of arrival.

## 2023-06-27 ENCOUNTER — Other Ambulatory Visit: Payer: Self-pay

## 2023-06-27 ENCOUNTER — Other Ambulatory Visit (HOSPITAL_COMMUNITY): Payer: Self-pay

## 2023-07-04 ENCOUNTER — Other Ambulatory Visit (HOSPITAL_COMMUNITY): Payer: Self-pay

## 2023-07-04 ENCOUNTER — Telehealth: Payer: Self-pay | Admitting: Pharmacist

## 2023-07-04 NOTE — Progress Notes (Signed)
   07/04/2023  Patient ID: Jordan Sparks, male   DOB: 10/20/1975, 48 y.o.   MRN: [id number]  Called and spoke with the patient on the phone today. He reports having all of his other medications on-hand, but running low on Farxiga . Advised he should be out of the medication based on fill history and agreed to getting medication refill today. No concerns to pass along to PCP at this time.  Called pharmacy and placed Farxiga  refill. Advised he gets it mailed and aware of the $4 copay. Filling the prescription now.   [name], PharmD [facility]  Phone Number: [phone]

## 2023-07-10 ENCOUNTER — Other Ambulatory Visit (HOSPITAL_BASED_OUTPATIENT_CLINIC_OR_DEPARTMENT_OTHER): Payer: Self-pay | Admitting: *Deleted

## 2023-07-10 DIAGNOSIS — E1165 Type 2 diabetes mellitus with hyperglycemia: Secondary | ICD-10-CM

## 2023-07-12 LAB — COLOGUARD: COLOGUARD: NEGATIVE

## 2023-07-13 ENCOUNTER — Encounter (HOSPITAL_BASED_OUTPATIENT_CLINIC_OR_DEPARTMENT_OTHER): Payer: Self-pay | Admitting: Family Medicine

## 2023-07-14 ENCOUNTER — Other Ambulatory Visit (HOSPITAL_COMMUNITY): Payer: Self-pay

## 2023-07-23 ENCOUNTER — Other Ambulatory Visit (HOSPITAL_COMMUNITY): Payer: Self-pay

## 2023-07-31 ENCOUNTER — Other Ambulatory Visit (HOSPITAL_COMMUNITY): Payer: Self-pay

## 2023-08-09 ENCOUNTER — Other Ambulatory Visit: Payer: Self-pay

## 2023-08-09 ENCOUNTER — Telehealth: Payer: Self-pay | Admitting: Pharmacist

## 2023-08-09 ENCOUNTER — Other Ambulatory Visit (HOSPITAL_COMMUNITY): Payer: Self-pay

## 2023-08-09 DIAGNOSIS — E1165 Type 2 diabetes mellitus with hyperglycemia: Secondary | ICD-10-CM

## 2023-08-09 MED ORDER — ACCU-CHEK GUIDE TEST VI STRP
ORAL_STRIP | 3 refills | Status: AC
  Filled 2023-08-09: qty 50, 30d supply, fill #0
  Filled 2023-08-18: qty 100, 90d supply, fill #0
  Filled 2023-11-13: qty 100, 90d supply, fill #1
  Filled 2024-02-11: qty 100, 90d supply, fill #2

## 2023-08-09 MED ORDER — ACCU-CHEK GUIDE ME W/DEVICE KIT
PACK | 0 refills | Status: DC
  Filled 2023-08-09: qty 1, 1d supply, fill #0
  Filled 2023-08-18: qty 1, 30d supply, fill #0

## 2023-08-09 MED ORDER — ACCU-CHEK SOFTCLIX LANCETS MISC
3 refills | Status: AC
  Filled 2023-08-09 – 2023-08-18 (×2): qty 100, 90d supply, fill #0
  Filled 2023-11-13: qty 100, 90d supply, fill #1
  Filled 2024-02-11: qty 100, 90d supply, fill #2

## 2023-08-09 NOTE — Progress Notes (Addendum)
   08/09/2023  Patient ID: Jordan Sparks, male   DOB: 1975-09-21, 47 y.o.   MRN: [id number]  Called and spoke with the patient on the phone today regarding medications and diabetes.   Reports adherence to Farxiga  and metformin  since we last talked. Therefore A1c should be accurate. Due to this, advised patient will need to try another medication while waiting on Endo referral set-up. Would like to switch Farxiga  + Metformin  to Xigduo 5/1000mg  twice a day to simplify regimen. Then start Glipizide  XL 5mg  daily. Stopped the Glipizide  5mg  twice a day in 2023 due to low blood sugars at the time.   Would like to avoid injectables if possible. Cannot get Rybelsus on Medicaid unless trying weekly injectable first.  Also needs new testing supplies to check readings at home. Will send scripts.  Lastly, carvedilol  has been coming in 30DS instead of 90DS- due to minimizing $4 copay concerns. Will need to fix this as well.  Rx change approved by Dr. Gita Lamb de [place]. Prescriptions sent to the pharmacy.   Follow-up in 3 weeks on 08/30/23.   Update from 08/13/23:  - Called and spoke with the pharmacy on the phone- processing Glipizide  XL and Xigduo and should be shipped out later today - [facility] [place] [facility], [facility], and medical benefits with insurance (total of 63 minutes for calls) - Patient is not covered under plan for testing supplies   - Cheapest option would be to get from [facility] for ReliOn meter and testing strips    Jordan Sparks, PharmD [place]  Phone Number: [phone]

## 2023-08-10 ENCOUNTER — Other Ambulatory Visit (HOSPITAL_COMMUNITY): Payer: Self-pay

## 2023-08-13 ENCOUNTER — Other Ambulatory Visit: Payer: Self-pay

## 2023-08-13 ENCOUNTER — Other Ambulatory Visit (HOSPITAL_COMMUNITY): Payer: Self-pay

## 2023-08-13 MED ORDER — GLIPIZIDE ER 5 MG PO TB24
5.0000 mg | ORAL_TABLET | Freq: Every day | ORAL | 0 refills | Status: DC
  Filled 2023-08-13: qty 90, 90d supply, fill #0

## 2023-08-13 MED ORDER — DAPAGLIFLOZIN PRO-METFORMIN ER 5-1000 MG PO TB24
2.0000 | ORAL_TABLET | Freq: Every day | ORAL | 5 refills | Status: AC
  Filled 2023-08-13: qty 60, 30d supply, fill #0
  Filled 2023-09-08: qty 60, 30d supply, fill #1
  Filled 2023-10-07: qty 60, 30d supply, fill #2
  Filled 2023-11-06: qty 60, 30d supply, fill #3
  Filled 2023-12-13: qty 60, 30d supply, fill #4
  Filled 2024-01-10: qty 60, 30d supply, fill #5

## 2023-08-14 ENCOUNTER — Other Ambulatory Visit (HOSPITAL_COMMUNITY): Payer: Self-pay

## 2023-08-15 ENCOUNTER — Telehealth: Payer: Self-pay | Admitting: Pharmacist

## 2023-08-15 DIAGNOSIS — E1165 Type 2 diabetes mellitus with hyperglycemia: Secondary | ICD-10-CM

## 2023-08-15 NOTE — Progress Notes (Signed)
   08/15/2023  Patient ID: Jordan Sparks, male   DOB: 06-26-1975, 48 y.o.   MRN: [id number]  Called and spoke with the patient on the phone today confirming he received his new medications. Started Xigduo this morning and aware this replaces BOTH the metformin  and farxiga . Asked if it was also too late to change his mind about starting a GLP-1 agonist.   Advised that is fine as we can just swap the new Glipizide  XL with this medication anyways.   Also told patient that his test strips were NOT covered by insurance. Best option was to get ReliOn meter from [place] for $9 and 25 pack of test strips for $5. Said he will look into it.  Plan:  - Send message to Dr. De [place] regarding trying Ozempic instead of the Glipizide  XL we sent  - Jordan Sparks he researched it more and realized how it can benefit his overall health along with A1c   Jordan Sparks, PharmD [facility]  Phone Number: [phone]

## 2023-08-20 ENCOUNTER — Other Ambulatory Visit: Payer: Self-pay

## 2023-08-20 ENCOUNTER — Other Ambulatory Visit (HOSPITAL_BASED_OUTPATIENT_CLINIC_OR_DEPARTMENT_OTHER): Payer: Self-pay

## 2023-08-20 ENCOUNTER — Other Ambulatory Visit (HOSPITAL_COMMUNITY): Payer: Self-pay

## 2023-08-20 MED ORDER — OZEMPIC (0.25 OR 0.5 MG/DOSE) 2 MG/3ML ~~LOC~~ SOPN
PEN_INJECTOR | SUBCUTANEOUS | 1 refills | Status: DC
  Filled 2023-08-20: qty 3, 42d supply, fill #0
  Filled 2023-10-06: qty 3, 42d supply, fill #1

## 2023-08-20 NOTE — Progress Notes (Unsigned)
 Rx for Ozempic sent to the pharmacy. PA submitted to [facility] and approved- [id number]. Sent message to pharmacy to re-run the script.

## 2023-08-23 ENCOUNTER — Encounter: Payer: Self-pay | Admitting: Sports Medicine

## 2023-08-23 ENCOUNTER — Other Ambulatory Visit: Payer: Self-pay

## 2023-08-23 ENCOUNTER — Ambulatory Visit: Admitting: Sports Medicine

## 2023-08-23 ENCOUNTER — Other Ambulatory Visit (INDEPENDENT_AMBULATORY_CARE_PROVIDER_SITE_OTHER): Payer: Self-pay

## 2023-08-23 DIAGNOSIS — M25551 Pain in right hip: Secondary | ICD-10-CM

## 2023-08-23 DIAGNOSIS — G8929 Other chronic pain: Secondary | ICD-10-CM | POA: Diagnosis not present

## 2023-08-23 DIAGNOSIS — M25511 Pain in right shoulder: Secondary | ICD-10-CM

## 2023-08-23 DIAGNOSIS — M1611 Unilateral primary osteoarthritis, right hip: Secondary | ICD-10-CM

## 2023-08-23 DIAGNOSIS — E11649 Type 2 diabetes mellitus with hypoglycemia without coma: Secondary | ICD-10-CM

## 2023-08-23 MED ORDER — LIDOCAINE HCL 1 % IJ SOLN
4.0000 mL | INTRAMUSCULAR | Status: AC | PRN
Start: 2023-08-23 — End: 2023-08-23
  Administered 2023-08-23: 4 mL

## 2023-08-23 MED ORDER — METHYLPREDNISOLONE ACETATE 40 MG/ML IJ SUSP
40.0000 mg | INTRAMUSCULAR | Status: AC | PRN
  Administered 2023-08-23: 40 mg via INTRA_ARTICULAR

## 2023-08-23 NOTE — Progress Notes (Signed)
 Patient started his first dose of Ozempic 0.25mg  weekly today. Will be getting Ozempic 0.5mg  dose on 7/10. Requested to call back before that dose is due as a reminder to increase it.   No issues of nausea today since taking it. Will continue to take weekly as prescribed.

## 2023-08-23 NOTE — Progress Notes (Signed)
**Note Jordan-Identified via Obfuscation**  Jordan Sparks - 48 y.o. male MRN [id number]  Date of birth: 17-Jul-1975  Office Visit Note: Visit Date: 08/23/2023 PCP: Jordan [place], Raymond J, MD Referred by: Jordan [place], Raymond J, MD  Subjective: Chief Complaint  Patient presents with   Right Hip - Pain   HPI: Jordan Sparks is a pleasant 48 y.o. male who presents today for acute on chronic right hip pain with known OA.  Back in January of this year we did proceed with ultrasound-guided right intra-articular hip injection which gave him good relief for period of time.  Here over the last month or so his pain has started to become exacerbated and flared back up.  He denies any new specific injury.  He is getting started in planning to workout at the gym to help lose weight and get in shape.  He just started Ozempic from his primary physician today.  The right shoulder is doing better and feeling stronger.  In the past we did perform injection last year and has done some therapy for this.  He is a type-II diabetic. He is working on improving his A1c and has brought it down about 2 pts. Lab Results  Component Value Date   HGBA1C 8.5 (H) 06/21/2023   Pertinent ROS were reviewed with the patient and found to be negative unless otherwise specified above in HPI.   Assessment & Plan: Visit Diagnoses:  1. Chronic right hip pain   2. Type 2 diabetes mellitus with hypoglycemia without coma, without long-term current use of insulin  (HCC)   3. Unilateral primary osteoarthritis, right hip   4. Chronic right shoulder pain    Plan: Impression is exacerbation of chronic right hip pain with mild to moderate osteoarthritis.  He would benefit from some hip stabilization of both hips with flexion and abduction strengthening.  Through shared decision making, we did proceed with ultrasound-guided intra-articular right hip injection for pain relief.  Advised on postinjection protocol.  Following this, I would like him to get started in  formalized physical therapy for the hips.  He is also planning on getting started in a workout routine which I discussed the importance of weight based training given that he is starting Ozempic for his diabetes and with weight loss.  He will continue with Ozempic 0.25 mg IM weekly and increase per protocol from his PCP. Given that gabapentin  was not significantly helpful, I would like him to discontinue this medication currently.  Referral sent for formalized PT at [facility] and we will get him involved in aquatic-based PT as well as land.  We will see him back in about 6 weeks for reevaluation.  He will continue his glipizide  5 mg daily and his Xigduo  for his DM in interim.  Follow-up: Return in about 6 weeks (around 10/04/2023), or R-hip.   Meds & Orders: No orders of the defined types were placed in this encounter.   Orders Placed This Encounter  Procedures   Large Joint Inj   XR HIP UNILAT W OR W/O PELVIS 2-3 VIEWS RIGHT   US  Guided Needle Placement - No Linked Charges   Ambulatory referral to Physical Therapy     Procedures: Large Joint Inj: R hip joint on 08/23/2023 9:08 AM Indications: pain Details: 22 G 3.5 in needle, ultrasound-guided anterior approach Medications: 4 mL lidocaine  1 %; 40 mg methylPREDNISolone  acetate 40 MG/ML Outcome: tolerated well, no immediate complications  Procedure: US -guided intra-articular hip injection, Right After discussion on risks/benefits/indications and informed verbal consent  was obtained, a timeout was performed. Patient was lying supine on exam table. The hip was cleaned with betadine and alcohol swabs. Then utilizing ultrasound guidance, the patient's femoral head and neck junction was identified and subsequently injected with 4:1 lidocaine :depomedrol via an in-plane approach with ultrasound visualization of the injectate administered into the hip joint. Patient tolerated procedure well without immediate complications.  Procedure, treatment  alternatives, risks and benefits explained, specific risks discussed. Consent was given by the patient. Immediately prior to procedure a time out was called to verify the correct patient, procedure, equipment, support staff and site/side marked as required. Patient was prepped and draped in the usual sterile fashion.          Clinical History: MRI LUMBAR SPINE WITHOUT CONTRAST   TECHNIQUE: Multiplanar, multisequence MR imaging of the lumbar spine was performed. No intravenous contrast was administered.   COMPARISON:  Lumbar spine radiographs 04/14/2021. CTA chest, abdomen, and pelvis 06/18/2020.   FINDINGS: The study is motion degraded, moderately so on the axial sequences.   Segmentation: Standard.   Alignment: Trace retrolisthesis of L4 on L5. Straightening of the normal lumbar lordosis.   Vertebrae: No fracture, suspicious marrow lesion, or significant marrow edema. Mild chronic degenerative endplate changes at L5-S1.   Conus medullaris and cauda equina: Conus extends to the L1-2 level. Conus and cauda equina appear normal.   Paraspinal and other soft tissues: Unremarkable.   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   T12-L1 and L1-2: Negative.   L2-3: Minimal disc bulging without stenosis.   L3-4: Mild disc bulging without significant stenosis.   L4-5: Mild disc desiccation. Circumferential disc bulging and mild facet hypertrophy result in mild spinal stenosis, mild bilateral lateral recess stenosis, and mild-to-moderate bilateral neural foraminal stenosis.   L5-S1: Disc desiccation. Disc bulging and mild facet hypertrophy result in mild right and moderate left neural foraminal stenosis without spinal stenosis.   IMPRESSION: 1. Motion degraded examination. 2. 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     Electronically Signed   By: Aundra Lee M.D.   On: 05/16/2021 18:00  He reports that he has been smoking cigarettes and cigars. He started smoking about 34 years ago. He has a 8 pack-year smoking history. He has been exposed to tobacco smoke. He has never used smokeless tobacco.  Recent Labs    09/07/22 0946 02/05/23 1404 06/21/23 1141  HGBA1C 9.7* 9.3* 8.5*    Objective:    Physical Exam  Gen: Well-appearing, in no acute distress; non-toxic CV: Well-perfused. Warm.  Resp: Breathing unlabored on room air; no wheezing. Psych: Fluid speech in conversation; appropriate affect; normal thought process  Ortho Exam - Right hip: There is no redness swelling or effusion.  There is no TTP over the greater trochanteric region.  There is some pain with resisted hip flexion and positive FADIR testing.  There was equivocal but not full strength with hip abduction strength testing bilaterally.  Imaging: XR HIP UNILAT W OR W/O PELVIS 2-3 VIEWS RIGHT Result Date: 08/23/2023 2 views of the right hip including AP and lateral femoral ordered and reviewed by myself.  X-rays demonstrate mild to moderate osteoarthritic change about the right hip with some sclerotic changes in the acetabular rim.  There is no femoral head or neck collapse.  No acute fracture or otherwise bony  abnormality noted.  Past Medical/Family/Surgical/Social History: Medications & Allergies reviewed per EMR, new medications updated. Patient Active Problem List   Diagnosis Date Noted   Use of cannabis 06/22/2023   History of cocaine use 06/22/2023   Low TSH level 06/21/2023   Thickened nails 02/05/2023   Trigger thumb, right thumb 04/18/2022   Bipolar 1 disorder, mixed, moderate (HCC) 12/06/2021   Insomnia 12/06/2021   Encounter for long-term (current) use of medications 12/06/2021   Unilateral primary osteoarthritis, right hip 11/30/2021   Carpal tunnel syndrome, right upper limb  11/30/2021   Congestive heart failure (CHF) (HCC) 11/10/2021   Low back pain 10/11/2021   Peripheral neuropathy 10/11/2021   Pain in left shoulder 08/31/2021   Spinal stenosis of cervical region 06/09/2021   Nonischemic cardiomyopathy (HCC) 06/06/2021   Chest pain 06/01/2021   Obesity, Class III, BMI 40-49.9 (morbid obesity) 05/31/2021   Diabetes mellitus (HCC) 05/04/2021   Sleep apnea 05/04/2021   Hypertensive urgency 10/19/2020   GERD (gastroesophageal reflux disease) 02/09/2018   Atypical chest pain 02/08/2018   Hypokalemia 02/08/2018   Leukocytosis 02/08/2018   Nausea and vomiting 02/08/2018   Hypertension    Anxiety    Anosmia 10/26/2015   Chronic pansinusitis 10/26/2015   Laryngopharyngeal reflux (LPR) 10/26/2015   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   Nasal turbinate hypertrophy 10/26/2015   Smokes 10/26/2015   Snoring 10/26/2015   Past Medical History:  Diagnosis Date   Anxiety    Asthma    CHF (congestive heart failure) (HCC)    Coronary artery disease    Depression    Diabetes mellitus type 2 in obese    Hypertension    Manic disorder (HCC)    Obesity    Sleep apnea    Family History  Problem Relation Age of Onset   Seizures Brother    Heart failure Neg Hx    Past Surgical History:  Procedure Laterality Date   CARPAL TUNNEL RELEASE Right 12/19/2021   Procedure: RIGHT CARPAL TUNNEL RELEASE;  Surgeon: Adah Acron, MD;  Location: [facility];  Service: Orthopedics;  Laterality: Right;   Dental procedure     Social History   Occupational History   Occupation: truck Hospital doctor    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, [employer]  Tobacco Use   Smoking status: Some Days    Current packs/day: 0.00    Average packs/day: 0.3 packs/day for 32.0 years (8.0 ttl pk-yrs)    Types: Cigarettes, Cigars    Start date: 04/1989    Last attempt to quit: 04/2021    Years since quitting: 2.3    Passive exposure: Past   Smokeless tobacco: Never  Vaping Use   Vaping  status: Never Used  Substance and Sexual Activity   Alcohol use: Not Currently    Comment: rarely   Drug use: Yes    Types: Marijuana    Comment: CBD store bought for pain and anxiety   Sexual activity: Yes    Partners: Female    Birth control/protection: None

## 2023-08-23 NOTE — Progress Notes (Signed)
 Patient says that his right hip has flared back up again in the last month or two. He denies any new injury, and says that his pain has remained about the same, as far as severity, since it returned. He is having pain mostly through the front of the hip. He is taking Tylenol  Arthritis and using a topical treatment as well with little relief.
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
## 2023-08-31 ENCOUNTER — Other Ambulatory Visit: Payer: Self-pay

## 2023-08-31 ENCOUNTER — Encounter (HOSPITAL_COMMUNITY): Payer: Self-pay | Admitting: Psychiatry

## 2023-08-31 ENCOUNTER — Telehealth (HOSPITAL_COMMUNITY): Admitting: Psychiatry

## 2023-08-31 ENCOUNTER — Other Ambulatory Visit (HOSPITAL_COMMUNITY): Payer: Self-pay

## 2023-08-31 DIAGNOSIS — F3162 Bipolar disorder, current episode mixed, moderate: Secondary | ICD-10-CM

## 2023-08-31 MED ORDER — HYDROXYZINE HCL 25 MG PO TABS
12.5000 mg | ORAL_TABLET | Freq: Two times a day (BID) | ORAL | 1 refills | Status: DC | PRN
  Filled 2023-08-31: qty 60, 30d supply, fill #0

## 2023-08-31 MED ORDER — DULOXETINE HCL 60 MG PO CPEP
60.0000 mg | ORAL_CAPSULE | Freq: Two times a day (BID) | ORAL | 1 refills | Status: DC
Start: 2023-08-31 — End: 2023-12-13
  Filled 2023-08-31 – 2023-11-06 (×2): qty 180, 90d supply, fill #0

## 2023-08-31 MED ORDER — BUPROPION HCL ER (XL) 150 MG PO TB24
150.0000 mg | ORAL_TABLET | ORAL | 1 refills | Status: DC
  Filled 2023-08-31 – 2023-10-11 (×2): qty 90, 90d supply, fill #0

## 2023-08-31 MED ORDER — DIVALPROEX SODIUM ER 500 MG PO TB24
500.0000 mg | ORAL_TABLET | Freq: Every evening | ORAL | 0 refills | Status: DC
  Filled 2023-08-31: qty 90, 90d supply, fill #0

## 2023-08-31 MED ORDER — ARIPIPRAZOLE 5 MG PO TABS
5.0000 mg | ORAL_TABLET | Freq: Every day | ORAL | 1 refills | Status: DC
  Filled 2023-08-31: qty 30, 30d supply, fill #0
  Filled 2023-10-07: qty 30, 30d supply, fill #1
  Filled 2023-11-06: qty 30, 30d supply, fill #2
  Filled 2023-12-13: qty 30, 30d supply, fill #3

## 2023-08-31 NOTE — Patient Instructions (Signed)
 Thank you for attending your appointment today.  -- START hydroxyzine 12.5-25 mg twice daily as needed for panic attacks -- Continue other medications as prescribed.  Please do not make any changes to medications without first discussing with your provider. If you are experiencing a psychiatric emergency, please call 911 or present to your nearest emergency department. Additional crisis, medication management, and therapy resources are included below.  [facility]  [address], [place], [place] [address] [phone] WALK-IN URGENT CARE 24/7 FOR ANYONE [address], [place], [place]  [phone] Fax: [phone] [email or web address] *Interpreters available *Accepts all insurance and uninsured for Urgent Care needs *Accepts Medicaid and uninsured for outpatient treatment (below)      ONLY FOR [facility]  Below:    Outpatient New Patient Assessment/Therapy Walk-ins:        Monday, Wednesday, and Thursday 8am until slots are full (first come, first served)                   New Patient Psychiatry/Medication Management        Monday-Friday 8am-11am (first come, first served)               For all walk-ins we ask that you arrive by 7:15am, because patients will be seen in the order of arrival.

## 2023-09-08 ENCOUNTER — Other Ambulatory Visit (HOSPITAL_BASED_OUTPATIENT_CLINIC_OR_DEPARTMENT_OTHER): Payer: Self-pay | Admitting: Family Medicine

## 2023-09-08 ENCOUNTER — Other Ambulatory Visit (HOSPITAL_COMMUNITY): Payer: Self-pay

## 2023-09-10 ENCOUNTER — Other Ambulatory Visit (HOSPITAL_COMMUNITY): Payer: Self-pay

## 2023-09-10 ENCOUNTER — Other Ambulatory Visit: Payer: Self-pay

## 2023-09-10 MED ORDER — FLUTICASONE PROPIONATE 50 MCG/ACT NA SUSP
2.0000 | Freq: Every day | NASAL | 1 refills | Status: DC | PRN
  Filled 2023-09-10: qty 16, 30d supply, fill #0
  Filled 2023-11-06: qty 16, 30d supply, fill #1

## 2023-09-18 ENCOUNTER — Other Ambulatory Visit: Payer: Self-pay | Admitting: Student

## 2023-09-18 ENCOUNTER — Other Ambulatory Visit: Payer: Self-pay

## 2023-09-19 ENCOUNTER — Telehealth: Payer: Self-pay | Admitting: Pharmacist

## 2023-09-19 NOTE — Progress Notes (Signed)
   09/19/2023  Patient ID: Jordan Sparks, male   DOB: January 26, 1976, 48 y.o.   MRN: [id number]  Called patient for quick reminder to increase Ozempic  to 0.5mg  weekly starting tomorrow. Will have 2 injections at this dose left in the pen and then will need a new one from the pharmacy for next month. Confirmed understanding and no further concerns at this time.   Will set task for Asajah to follow-up with BG readings and how patient is doing on 8/6. This will decide about dose increase to 1mg  or not.    Aloysius Lewis, PharmD [facility]  Phone Number: [phone]

## 2023-09-20 ENCOUNTER — Ambulatory Visit: Admitting: Physician Assistant

## 2023-09-20 ENCOUNTER — Ambulatory Visit (HOSPITAL_BASED_OUTPATIENT_CLINIC_OR_DEPARTMENT_OTHER): Admitting: Family Medicine

## 2023-09-20 ENCOUNTER — Telehealth: Payer: Self-pay

## 2023-09-20 ENCOUNTER — Other Ambulatory Visit (HOSPITAL_COMMUNITY): Payer: Self-pay

## 2023-09-20 ENCOUNTER — Other Ambulatory Visit: Payer: Self-pay

## 2023-09-20 DIAGNOSIS — M545 Low back pain, unspecified: Secondary | ICD-10-CM

## 2023-09-20 MED ORDER — CARVEDILOL 25 MG PO TABS
25.0000 mg | ORAL_TABLET | Freq: Two times a day (BID) | ORAL | 4 refills | Status: DC
  Filled 2023-09-20: qty 60, 30d supply, fill #0
  Filled 2023-11-06: qty 60, 30d supply, fill #1
  Filled 2023-12-13: qty 60, 30d supply, fill #2
  Filled 2024-01-10: qty 60, 30d supply, fill #3
  Filled 2024-02-07: qty 60, 30d supply, fill #4

## 2023-09-21 ENCOUNTER — Encounter: Payer: Self-pay | Admitting: Physical Therapy

## 2023-09-21 ENCOUNTER — Other Ambulatory Visit: Payer: Self-pay

## 2023-09-21 ENCOUNTER — Ambulatory Visit: Attending: Sports Medicine | Admitting: Physical Therapy

## 2023-09-21 DIAGNOSIS — R2689 Other abnormalities of gait and mobility: Secondary | ICD-10-CM | POA: Insufficient documentation

## 2023-09-21 DIAGNOSIS — M5459 Other low back pain: Secondary | ICD-10-CM | POA: Diagnosis present

## 2023-09-21 DIAGNOSIS — M25551 Pain in right hip: Secondary | ICD-10-CM | POA: Insufficient documentation

## 2023-09-21 DIAGNOSIS — M6281 Muscle weakness (generalized): Secondary | ICD-10-CM | POA: Insufficient documentation

## 2023-09-21 DIAGNOSIS — M1611 Unilateral primary osteoarthritis, right hip: Secondary | ICD-10-CM | POA: Insufficient documentation

## 2023-09-21 DIAGNOSIS — G8929 Other chronic pain: Secondary | ICD-10-CM | POA: Diagnosis not present

## 2023-09-21 DIAGNOSIS — M25552 Pain in left hip: Secondary | ICD-10-CM | POA: Insufficient documentation
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
## 2023-09-21 NOTE — Patient Instructions (Signed)

## 2023-09-27 ENCOUNTER — Ambulatory Visit: Payer: Self-pay | Admitting: Physical Therapy
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
## 2023-10-04 ENCOUNTER — Ambulatory Visit: Admitting: Physical Therapy

## 2023-10-04 ENCOUNTER — Ambulatory Visit (HOSPITAL_COMMUNITY): Admitting: Licensed Clinical Social Worker

## 2023-10-04 DIAGNOSIS — F3162 Bipolar disorder, current episode mixed, moderate: Secondary | ICD-10-CM | POA: Diagnosis not present

## 2023-10-04 NOTE — Progress Notes (Signed)
 THERAPIST PROGRESS NOTE  Virtual Visit via Video Note  I connected with Jordan Sparks on 10/04/23 at 10:00 AM EDT by a video enabled telemedicine application and verified that I am speaking with the correct person using two identifiers.  Location: Patient: [facility]  Provider: Provider Home    I discussed the limitations of evaluation and management by telemedicine and the availability of in person appointments. The patient expressed understanding and agreed to proceed.   I discussed the assessment and treatment plan with the patient. The patient was provided an opportunity to ask questions and all were answered. The patient agreed with the plan and demonstrated an understanding of the instructions.   The patient was advised to call back or seek an in-person evaluation if the symptoms worsen or if the condition fails to improve as anticipated.  I provided 30 minutes of non-face-to-face time during this encounter.   Juliene GORMAN Patee, LCSW   Participation Level: Active  Behavioral Response: CasualAlertAnxious and Depressed  Type of Therapy: Individual Therapy  Treatment Goals addressed:  Active     Anxiety     LTG: Obed will score less than 5 on the Generalized Anxiety Disorder 7 Scale (GAD-7)      Start:  10/04/23    Expected End:  03/13/24         STG: Jordan Sparks will attend at least 80% of scheduled IOP sessions      Start:  10/04/23    Expected End:  03/13/24         STG: Jordan Sparks will participate in at least 80% of scheduled individual psychotherapy sessions      Start:  10/04/23    Expected End:  03/13/24         STG: Jordan Sparks will practice problem solving skills 3 times per week for the next 4 weeks.      Start:  10/04/23    Expected End:  03/13/24         STG: Jordan Sparks will reduce frequency of avoidant behaviors by 50% as evidenced by self-report in therapy sessions     Start:  10/04/23    Expected End:  03/13/24         Discuss risks and  benefits of medication treatment options for this problem and prescribe as indicated     Start:  10/04/23         Encourage Jordan Sparks to take psychotropic medication(s) as prescribed     Start:  10/04/23         Review results of GAD-7 with Jordan Sparks to track progress     Start:  10/04/23         Work with Jordan Sparks to track symptoms, triggers, and/or skill use through a mood chart, diary card, or journal     Start:  10/04/23         Perform psychoeducation regarding anxiety disorders     Start:  10/04/23         Provide Jordan Sparks with educational information and reading material on anxiety, its causes, and symptoms.      Start:  10/04/23            ProgressTowards Goals: Initial  Interventions: CBT, Motivational Interviewing, and Supportive   Suicidal/Homicidal: Nowithout intent/plan  Therapist Response:     Jordan Sparks was alert and oriented x 5.  He was pleasant, cooperative, maintained good eye contact.  He engaged well in therapy session was dressed casually.  Patient presented with anxious mood\affect.  Patient comes  in today to establish therapeutic care due to anxiety and panic attacks.  Patient reports that when he starts thinking about something he becomes fixated on it.  Patient reports that he has been getting anxiety attacks about thoughts  of mortality.  Patient reports utilization of coping skills such as a deep and  breathing walking outside.  Patient reports that he would like to get to the root cause of the problems.  Intervention/plan: LCSW utilized education on DBT for TIPP.  LCSW provided patient worksheets on TIPP.  LCSW reviewed coping skills such as utilization a temperature change, intensive exercise, progressive muscle relaxation, and paced breathing.  LCSW educated patient meditation/breathing exercises for box technique.  LCSW utilized psychoanalytic therapy for patient to express thoughts, feelings and emotions and nonjudgmental environment.  LCSW utilized  supportive therapy for praise and encouragement.  LCSW utilize person Center therapy for unconditional positive regard and empowerment.  Plan: Return again in 4 weeks.  Diagnosis: Bipolar 1 disorder, mixed, moderate (HCC)  Collaboration of Care: Other Engage in individual therapy monthly   Patient/Guardian was advised Release of Information must be obtained prior to any record release in order to collaborate their care with an outside provider. Patient/Guardian was advised if they have not already done so to contact the registration department to sign all necessary forms in order for us  to release information regarding their care.   Consent: Patient/Guardian gives verbal consent for treatment and assignment of benefits for services provided during this visit. Patient/Guardian expressed understanding and agreed to proceed.   Juliene GORMAN Patee, LCSW 10/04/2023

## 2023-10-05 ENCOUNTER — Ambulatory Visit: Admitting: Physical Therapy

## 2023-10-06 ENCOUNTER — Other Ambulatory Visit (HOSPITAL_COMMUNITY): Payer: Self-pay

## 2023-10-08 ENCOUNTER — Other Ambulatory Visit: Payer: Self-pay

## 2023-10-09 NOTE — Procedures (Signed)
 No show

## 2023-10-11 ENCOUNTER — Ambulatory Visit: Admitting: Physical Therapy

## 2023-10-11 ENCOUNTER — Ambulatory Visit: Admitting: Sports Medicine

## 2023-10-11 ENCOUNTER — Other Ambulatory Visit: Payer: Self-pay

## 2023-10-11 ENCOUNTER — Encounter: Payer: Self-pay | Admitting: Sports Medicine

## 2023-10-11 ENCOUNTER — Other Ambulatory Visit (HOSPITAL_COMMUNITY): Payer: Self-pay

## 2023-10-11 ENCOUNTER — Telehealth: Payer: Self-pay | Admitting: Physical Therapy

## 2023-10-11 DIAGNOSIS — M25551 Pain in right hip: Secondary | ICD-10-CM

## 2023-10-11 DIAGNOSIS — M1611 Unilateral primary osteoarthritis, right hip: Secondary | ICD-10-CM

## 2023-10-11 DIAGNOSIS — G8929 Other chronic pain: Secondary | ICD-10-CM

## 2023-10-11 NOTE — Progress Notes (Signed)
 Patient says that he got 1-2 days of relief from the hip injection. He has been in physical therapy one time since his last visit, and has another session scheduled later today. He says that he would like to discuss whether or not he should move to next steps since the injection does not give him more than 1-2 days of relief. 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

## 2023-10-11 NOTE — Progress Notes (Signed)
 Jordan Sparks - 48 y.o. male MRN [id number]  Date of birth: [birth year]  Office Visit Note: Visit Date: 10/11/2023 PCP: everitt Peru, Quintin PARAS, MD Referred by: de Peru, Raymond J, MD  Subjective: Chief Complaint  Patient presents with   Right Hip - Follow-up   HPI: Jordan Sparks is a pleasant 48 y.o. male who presents today for follow-up of acute on chronic right hip pain.  He has been experiencing acute on chronic right hip pain.  He does have known arthritic change in the hip but is not severe on x-rays.  We have performed 2 previous ultrasound-guided right intra-articular hip injections which essentially resolved his pain but this only lasted for a couple days and then his pain returned.  He is not having numbness or tingling going down the leg, his pain localizes more to the anterior aspect of the hip joint.  Pertinent ROS were reviewed with the patient and found to be negative unless otherwise specified above in HPI.   Assessment & Plan: Visit Diagnoses:  1. Chronic right hip pain   2. Unilateral primary osteoarthritis, right hip    Plan: Impression is acute on chronic right hip pain with arthritic change that is at least moderate on x-ray findings.  He has received essentially full relief of his pain but only for 3 or 4 days from 2 previous hip injections.  He also is involved in formalized physical therapy and has been doing home exercises but this at times worsens his pain and is certainly not improving.  Given this, I would like to further evaluate the hip joint and underlying structures with an MRI.  Once I review his MRI, I will reach out with next steps.  If his arthritic changes more advanced on MRI, could consider having him see one of my hip surgeons.  May continue his gabapentin  or over-the-counter anti-inflammatories as needed.  For his acute pain control, we did place a Toradol  1 ml (30mg /ml) injection intramuscularly into the right buttock.  Follow-up:  Return for will update patient on next steps after review of MRI.   Meds & Orders: No orders of the defined types were placed in this encounter.   Orders Placed This Encounter  Procedures   MR Hip Right w/o contrast     Procedures: No procedures performed      Clinical History: MRI LUMBAR SPINE WITHOUT CONTRAST   TECHNIQUE: Multiplanar, multisequence MR imaging of the lumbar spine was performed. No intravenous contrast was administered.   COMPARISON:  Lumbar spine radiographs 04/14/2021. CTA chest, abdomen, and pelvis 06/18/2020.   FINDINGS: The study is motion degraded, moderately so on the axial sequences.   Segmentation: Standard.   Alignment: Trace retrolisthesis of L4 on L5. Straightening of the normal lumbar lordosis.   Vertebrae: No fracture, suspicious marrow lesion, or significant marrow edema. Mild chronic degenerative endplate changes at L5-S1.   Conus medullaris and cauda equina: Conus extends to the L1-2 level. Conus and cauda equina appear normal.   Paraspinal and other soft tissues: Unremarkable.   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   T12-L1 and L1-2: Negative.   L2-3: Minimal disc bulging without stenosis.   L3-4: Mild disc bulging without significant stenosis.   L4-5: Mild disc desiccation. Circumferential disc bulging and mild facet hypertrophy result in mild spinal stenosis, mild bilateral lateral recess stenosis, and mild-to-moderate bilateral neural foraminal stenosis.   L5-S1: Disc desiccation. Disc bulging and  mild facet hypertrophy result in mild right and moderate left neural foraminal stenosis without spinal stenosis.   IMPRESSION: 1. Motion degraded examination. 2. 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     Electronically  Signed   By: Dasie Hamburg M.D.   On: 05/16/2021 18:00  He reports that he has been smoking cigarettes and cigars. He started smoking about 34 years ago. He has a 8 pack-year smoking history. He has been exposed to tobacco smoke. He has never used smokeless tobacco.  Recent Labs    02/05/23 1404 06/21/23 1141  HGBA1C 9.3* 8.5*    Objective:    Physical Exam  Gen: Well-appearing, in no acute distress; non-toxic CV: Well-perfused. Warm.  Resp: Breathing unlabored on room air; no wheezing. Psych: Fluid speech in conversation; appropriate affect; normal thought process  Ortho Exam - Right hip: There is tenderness over the anterior hip aspect near and around the ASIS.  There is no redness swelling or effusion.  No greater trochanteric TTP.  There is pain with endrange internal and external rotation although no significant bony blocking.  There is some guarding present with this motion.  There is pain with both FADIR and FABER testing, equivocal Stinchfield test.  Imaging:  - HIP XR: 08/23/23:  XR HIP UNILAT W OR W/O PELVIS 2-3 VIEWS RIGHT 2 views of the right hip including AP and lateral femoral ordered and  reviewed by myself.  X-rays demonstrate mild to moderate osteoarthritic  change about the right hip with some sclerotic changes in the acetabular  rim.  There is no femoral head or neck collapse.  No acute fracture or  otherwise bony abnormality noted.  Past Medical/Family/Surgical/Social History: Medications & Allergies reviewed per EMR, new medications updated. Patient Active Problem List   Diagnosis Date Noted   Use of cannabis 06/22/2023   History of cocaine use 06/22/2023   Low TSH level 06/21/2023   Thickened nails 02/05/2023   Trigger thumb, right thumb 04/18/2022   Bipolar 1 disorder, mixed, moderate (HCC) 12/06/2021   Insomnia 12/06/2021   Encounter for long-term (current) use of medications 12/06/2021   Unilateral primary osteoarthritis, right hip 11/30/2021    Carpal tunnel syndrome, right upper limb 11/30/2021   Congestive heart failure (CHF) (HCC) 11/10/2021   Low back pain 10/11/2021   Peripheral neuropathy 10/11/2021   Pain in left shoulder 08/31/2021   Spinal stenosis of cervical region 06/09/2021   Nonischemic cardiomyopathy (HCC) 06/06/2021   Chest pain 06/01/2021   Obesity, Class III, BMI 40-49.9 (morbid obesity) 05/31/2021   Diabetes mellitus (HCC) 05/04/2021   Sleep apnea 05/04/2021   Hypertensive urgency 10/19/2020   GERD (gastroesophageal reflux disease) 02/09/2018   Atypical chest pain 02/08/2018   Hypokalemia 02/08/2018   Leukocytosis 02/08/2018   Nausea and vomiting 02/08/2018   Hypertension    Anxiety    Anosmia 10/26/2015   Chronic pansinusitis 10/26/2015   Laryngopharyngeal reflux (LPR) 10/26/2015   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   Nasal turbinate hypertrophy 10/26/2015   Smokes 10/26/2015   Snoring 10/26/2015   Past Medical History:  Diagnosis Date   Anxiety    Asthma    CHF (congestive heart failure) (HCC)    Coronary artery disease    Depression    Diabetes mellitus type 2 in obese    Hypertension    Manic disorder (HCC)    Obesity    Sleep  apnea    Family History  Problem Relation Age of Onset   Seizures Brother    Heart failure Neg Hx    Past Surgical History:  Procedure Laterality Date   CARPAL TUNNEL RELEASE Right 12/19/2021   Procedure: RIGHT CARPAL TUNNEL RELEASE;  Surgeon: Barbarann Oneil BROCKS, MD;  Location: [facility];  Service: Orthopedics;  Laterality: Right;   Dental procedure     Social History   Occupational History   Occupation: truck Hospital doctor    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, [employer] architect  Tobacco Use   Smoking status: Some Days    Current packs/day: 0.00    Average packs/day: 0.3 packs/day for 32.0 years (8.0 ttl pk-yrs)    Types: Cigarettes, Cigars    Start date: 04/1989    Last attempt to quit: 04/2021    Years since quitting: 2.4    Passive exposure: Past    Smokeless tobacco: Never  Vaping Use   Vaping status: Never Used  Substance and Sexual Activity   Alcohol use: Not Currently    Comment: rarely   Drug use: Yes    Types: Marijuana    Comment: CBD store bought for pain and anxiety   Sexual activity: Yes    Partners: Female    Birth control/protection: None

## 2023-10-11 NOTE — Progress Notes (Unsigned)
 This encounter was created in error - please disregard.

## 2023-10-11 NOTE — Telephone Encounter (Signed)
Called and informed patient of missed visit and provided reminder of next appt and attendance policy.  

## 2023-10-12 ENCOUNTER — Other Ambulatory Visit: Payer: Self-pay

## 2023-10-12 ENCOUNTER — Ambulatory Visit: Admitting: Physical Therapy

## 2023-10-12 ENCOUNTER — Telehealth (HOSPITAL_COMMUNITY): Payer: Self-pay | Admitting: Psychiatry

## 2023-10-12 ENCOUNTER — Encounter (HOSPITAL_COMMUNITY): Payer: Self-pay

## 2023-10-12 ENCOUNTER — Other Ambulatory Visit (HOSPITAL_BASED_OUTPATIENT_CLINIC_OR_DEPARTMENT_OTHER): Payer: Self-pay

## 2023-10-12 ENCOUNTER — Encounter (HOSPITAL_COMMUNITY): Admitting: Psychiatry

## 2023-10-12 ENCOUNTER — Other Ambulatory Visit (HOSPITAL_COMMUNITY): Payer: Self-pay

## 2023-10-12 MED ORDER — HYDROXYZINE HCL 25 MG PO TABS
12.5000 mg | ORAL_TABLET | Freq: Two times a day (BID) | ORAL | 1 refills | Status: DC | PRN
  Filled 2023-10-12: qty 60, 30d supply, fill #0

## 2023-10-12 MED ORDER — DIVALPROEX SODIUM ER 500 MG PO TB24
500.0000 mg | ORAL_TABLET | Freq: Every evening | ORAL | 0 refills | Status: DC
  Filled 2023-10-12 – 2023-12-13 (×2): qty 90, 90d supply, fill #0

## 2023-10-12 NOTE — Telephone Encounter (Signed)
 Patient did not connect for virtual psychiatric medication management appointment on 10/12/23 at 11AM. Sent secure video link with no response. Called phone and patient reported he got called into work last minute and would not be able to make appointment. Requested to reschedule to next available on 10/2 at 11:30AM by video.  Refills of psychotropics sent to bridge until that appointment.  LAURAINE DELENA PUMMEL, MD 10/12/23

## 2023-10-18 ENCOUNTER — Encounter: Payer: Self-pay | Admitting: Physical Medicine and Rehabilitation

## 2023-10-18 ENCOUNTER — Other Ambulatory Visit: Payer: Self-pay

## 2023-10-18 ENCOUNTER — Ambulatory Visit: Admitting: Physical Medicine and Rehabilitation

## 2023-10-18 DIAGNOSIS — M5416 Radiculopathy, lumbar region: Secondary | ICD-10-CM | POA: Diagnosis not present

## 2023-10-18 DIAGNOSIS — M5441 Lumbago with sciatica, right side: Secondary | ICD-10-CM

## 2023-10-18 DIAGNOSIS — G8929 Other chronic pain: Secondary | ICD-10-CM | POA: Diagnosis not present

## 2023-10-18 DIAGNOSIS — G959 Disease of spinal cord, unspecified: Secondary | ICD-10-CM | POA: Diagnosis not present

## 2023-10-18 NOTE — Progress Notes (Signed)
 10/18/2023 Name: Jordan Sparks MRN: [id number] DOB: 1975/10/04  Chief Complaint  Patient presents with   Diabetes    {Visit Type:26650}   Subjective:  Care Team: Primary Care Provider: de Peru, Quintin PARAS, MD ; Next Scheduled Visit: *** {careteamprovider:27366}  Medication Access/Adherence  Current Pharmacy:  [facility] [address] [place] [address] Phone: [phone] Fax: [phone]  [facility] - [facility] [address] [place] [address] Phone: [phone] Fax: [phone]  [place] [facility] [address], [address] [place] [address] Phone: [phone] Fax: [phone]   Patient reports affordability concerns with their medications: {YES/NO:21197} Patient reports access/transportation concerns to their pharmacy: {YES/NO:21197} Patient reports adherence concerns with their medications:  {YES/NO:21197} ***   Diabetes:  Current medications: Ozempic  0.25 mg weekly for the past 4 weeks >>0.5 mg weekly for 2 weeks and denies GI issues>>   s/p new prescription .25 mg weekly for 2 weeks  Xigduo  XR 07-998 mg twice daily   Current glucose readings below reflects both Ozempic  doses:   Fasting (mg/dL): 891,894,04,885,890,883  Post prandial/bedtime (mg/dL): 864, 889, 876, 871   30-day average 116 mg/dL Using traditional blood glucose meter; testing 1-2 times daily  - He reports he feels horrible when his blood glucose < 110 mg/dLhe recently became acclimated when his blood glucose was in the 120s. He feels tired or drained with some nausea occasionally.    Patient denies hypoglycemic s/sx including dizziness, shakiness, sweating. Patient reports hyperglycemic symptoms including nocturia.   Current meal patterns:  - Breakfast: fruit (watermelon, strawberries), beef sausage, BIG slushies (at least 3-4 times per week on the way to work)   - Armed forces training and education officer (sauteed or fried), no veggies  - Supper protein, veggies, rice or potatoes, corn, salads  - Snacks fruit (pineapples, grapes, strawberries, orange, apples, etc)  - Drinks coffee w/sugar (no creamer), slushies, water (~ 1 pint), Cheerwine regular (sometimes); reduced fountain drinks   Current physical activity: Not at this time - attributes to hip problem     Objective:  Lab Results  Component Value Date   HGBA1C 8.5 (H) 06/21/2023    Lab Results  Component Value Date   CREATININE 0.79 04/12/2023   BUN 16 04/12/2023   NA 138 04/12/2023   K 3.9 04/12/2023   CL 100 04/12/2023   CO2 20 04/12/2023    Lab Results  Component Value Date   CHOL 189 05/04/2021   HDL 54 05/04/2021   LDLCALC 116 (H) 05/04/2021   TRIG 106 05/04/2021   CHOLHDL 3.5 05/04/2021    Medications Reviewed Today     Reviewed by Cleatus Dorcas SAUNDERS, RPH (Pharmacist) on 10/18/23 at 1521  Med List Status: <None>   Medication Order Taking? Sig Documenting Provider Last Dose Status Informant  Accu-Chek Softclix Lancets lancets [id number] Yes Use to check blood sugars daily. Dx code: E42.9 de Peru, Raymond J, MD  Active   albuterol  (VENTOLIN  HFA) 108 [phone] Base) MCG/ACT inhaler [id number] Yes Inhale 2 puffs into the lungs every 4 (four) hours as needed for wheezing or shortness of breath. Teresa Shelba SAUNDERS, NP  Active   amLODipine  (NORVASC ) 10 MG tablet [id number] Yes Take 1 tablet by mouth daily   Active   ARIPiprazole  (ABILIFY ) 5 MG tablet [id number] Yes Take 1 tablet (5 mg total) by mouth daily. Mercy Lauraine LABOR  Active   aspirin  EC 81 MG tablet [id number]  Take 1 tablet (81 mg  total) by mouth daily. Swallow whole.  Patient not taking: Reported on 10/18/2023   Goodrich, Callie E, PA-C  Active   blood glucose meter kit and supplies KIT [id number] Yes Use up to four times daily as directed. Blaise Aleene KIDD, MD  Active   Blood Glucose Monitoring Suppl (ACCU-CHEK GUIDE ME) w/Device KIT [id number] Yes  Use to check blood sugars daily. Dx code: E38.9 de Peru, Raymond J, MD  Active   buPROPion  (WELLBUTRIN  XL) 150 MG 24 hr tablet [id number] Yes Take 1 tablet (150 mg total) by mouth every morning. Bahraini, Lauraine LABOR  Active   carvedilol  (COREG ) 25 MG tablet [id number] Yes Take 1 tablet (25 mg total) by mouth 2 (two) times daily. Goodrich, Callie E, PA-C  Active   cetirizine  (ZYRTEC ) 10 MG tablet [id number] Yes Take 1 tablet (10 mg total) by mouth daily. de Peru, Raymond J, MD  Active   Dapagliflozin  Pro-metFORMIN  ER (XIGDUO  XR) 07-998 MG TB24 [id number] Yes Take 2 tablets by mouth daily. de Peru, Quintin PARAS, MD  Active   divalproex  (DEPAKOTE  ER) 500 MG 24 hr tablet [id number] Yes Take 1 tablet (500 mg total) by mouth at bedtime. Mercy Lauraine LABOR  Active   DULoxetine  (CYMBALTA ) 60 MG capsule [id number] Yes Take 1 capsule (60 mg total) by mouth 2 (two) times daily. Mercy Lauraine LABOR  Active   eplerenone  (INSPRA ) 25 MG tablet [id number] Yes Take 1 tablet (25 mg total) by mouth daily. de [place], Raymond J, MD  Active   fluticasone  (FLONASE ) 50 MCG/ACT nasal spray [id number] Yes Place 2 sprays into both nostrils daily as needed for allergies. de Peru, Quintin PARAS, MD  Active   glipiZIDE  (GLUCOTROL  XL) 5 MG 24 hr tablet [id number]  Take 1 tablet (5 mg total) by mouth daily with breakfast.  Patient not taking: Reported on 10/18/2023   de [place], Quintin PARAS, MD  Consider Medication Status and Discontinue (Change in therapy)   glucose blood (ACCU-CHEK GUIDE TEST) test strip [id number] Yes Use to check blood sugar daily. Dx code: E75.9 de Peru, Raymond J, MD  Active   hydrALAZINE  (APRESOLINE ) 25 MG tablet [id number] Yes Take 1 tablet (25 mg total) by mouth 3 (three) times daily. Goodrich, Callie E, PA-C  Active   hydrOXYzine  (ATARAX ) 25 MG tablet [id number] Yes Take 0.5-1 tablets (12.5-25 mg total) by mouth 2 (two) times daily as needed (panic attacks). Bahraini, Sarah A  Active   Multiple Vitamin (MULTIVITAMIN WITH MINERALS) TABS  tablet [id number]  Take 1 tablet by mouth daily.  Patient not taking: Reported on 10/18/2023   de [place], Quintin PARAS, MD  Active Self  JESSE SCHLOSSMAN [id number] Yes Pt uses a cpap nightly [provider]  Active   rosuvastatin  (CRESTOR ) 20 MG tablet [id number] Yes Take 1 tablet (20 mg total) by mouth daily. de Peru, Raymond J, MD  Active   sacubitril -valsartan  (ENTRESTO ) 97-103 MG [id number] Yes Take 1 tablet by mouth 2 (two) times daily.   Active   Semaglutide ,0.25 or 0.5MG /DOS, (OZEMPIC , 0.25 OR 0.5 MG/DOSE,) 2 MG/3ML SOPN [id number] Yes Inject 0.25mg  into the skin weekly for 4 weeks. Then increase to 0.5mg  weekly. de [place], Raymond J, MD  Active               Assessment/Plan:   {Pharmacy A/P Choices:26421} -Diet: reduce to small slushies 1-2 times per week for now or alternate way to work; increase protein, switch some beverages to diet. He asked if diet Red Bulls were okay we discussed  occassional use but to be mindful of BP and HTN given cardiac history  - Ozempic  0.5 mg weekly for 4 weeks: 8/13 0.5 mg, 8/20 0.5 mg 8/27 0.5 mg 9/3 - 0.5 mg assess for dose increase ** need appoint around the week of aug 25th **  Follow Up Plan: 3-4 weeks  To doL: entresto  prescription renewal !!  Dorcas Solian, PharmD Clinical Pharmacist Cell: [phone]
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
## 2023-10-18 NOTE — Progress Notes (Signed)
 Pain Scale   Average Pain 8 Patient advising he has had lower back pain x 4 years the radiates bilaterally to legs.         +Driver, -BT, -Dye Allergies.

## 2023-10-19 ENCOUNTER — Ambulatory Visit: Admitting: Physical Therapy

## 2023-10-24 ENCOUNTER — Ambulatory Visit (HOSPITAL_COMMUNITY): Admitting: Licensed Clinical Social Worker

## 2023-10-24 ENCOUNTER — Encounter (HOSPITAL_COMMUNITY): Payer: Self-pay | Admitting: Licensed Clinical Social Worker

## 2023-10-24 DIAGNOSIS — F3162 Bipolar disorder, current episode mixed, moderate: Secondary | ICD-10-CM
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
## 2023-10-25 ENCOUNTER — Other Ambulatory Visit (HOSPITAL_COMMUNITY): Payer: Self-pay

## 2023-10-26 ENCOUNTER — Ambulatory Visit (HOSPITAL_BASED_OUTPATIENT_CLINIC_OR_DEPARTMENT_OTHER): Admitting: Family Medicine

## 2023-10-26 VITALS — BP 161/105 | HR 84 | Ht 70.0 in | Wt 279.0 lb

## 2023-10-26 DIAGNOSIS — R7989 Other specified abnormal findings of blood chemistry: Secondary | ICD-10-CM

## 2023-10-26 DIAGNOSIS — E1165 Type 2 diabetes mellitus with hyperglycemia: Secondary | ICD-10-CM

## 2023-10-26 NOTE — Assessment & Plan Note (Addendum)
 Patient has been working with pharmacist through [facility].  He continues with metformin  and Farxiga , however taking these as combination pill currently.  Denies any issues with medications.  He has stopped the glipizide , but was started on Ozempic .  Reports that he currently is doing well with medication, currently administering 0.5 mg dose.  Denies any significant GI side effects.  He does have follow-up arranged with pharmacist to assess progress with medication and consider dose titration if doing well.  He does check fasting blood sugars at home and reports that readings have been much better as compared to readings in the past. We will proceed with A1c checked today for monitoring.  Did discuss that if A1c is slightly above goal, likely would not make significant changes to medication regimen as though expect that he will continue with gradual titration in dose of Ozempic  and thus would expect to see gradual improvement in blood sugars and A1c. Patient will be due for foot exam as well as urine ACR later in the year, we will complete these at next office visit

## 2023-10-26 NOTE — Assessment & Plan Note (Signed)
 Noted in the past, laboratory findings suggest subclinical hyperthyroidism.  Denies any current symptoms which suggest overt hyperthyroidism.  Prior labs with slightly low TSH with normal free T4 and T3.  At this time, we can proceed with labs for monitoring.  Further recommendations pending results of labs.

## 2023-10-26 NOTE — Progress Notes (Signed)
    Procedures performed today:    None.  Independent interpretation of notes and tests performed by another provider:   None.  Brief History, Exam, Impression, and Recommendations:    BP (!) 161/105   Pulse 84   Ht 5' 10 (1.778 m)   Wt 279 lb (126.6 kg)   SpO2 98%   BMI 40.03 kg/m   Type 2 diabetes mellitus with hyperglycemia, without long-term current use of insulin  [facility]) Assessment & Plan: Patient has been working with [facility].  He continues with metformin  and Farxiga , however taking these as combination pill currently.  Denies any issues with medications.  He has stopped the glipizide , but was started on Ozempic .  Reports that he currently is doing well with medication, currently administering 0.5 mg dose.  Denies any significant GI side effects.  He does have follow-up arranged with pharmacist to assess progress with medication and consider dose titration if doing well.  He does check fasting blood sugars at home and reports that readings have been much better as compared to readings in the past. We will proceed with A1c checked today for monitoring.  Did discuss that if A1c is slightly above goal, likely would not make significant changes to medication regimen as though expect that he will continue with gradual titration in dose of Ozempic  and thus would expect to see gradual improvement in blood sugars and A1c. Patient will be due for foot exam as well as urine ACR later in the year, we will complete these at next office visit  Orders: -     Hemoglobin A1c  Low TSH level Assessment & Plan: Noted in the past, laboratory findings suggest subclinical hyperthyroidism.  Denies any current symptoms which suggest overt hyperthyroidism.  Prior labs with slightly low TSH with normal free T4 and T3.  At this time, we can proceed with labs for monitoring.  Further recommendations pending results of labs.  Orders: -     TSH + free T4 -     T3  Return in  about 4 months (around 02/25/2024) for diabetes.   ___________________________________________ Chen Holzman de [place], MD, ABFM, CAQSM Primary Care and Sports Medicine [facility]

## 2023-10-27 LAB — TSH+FREE T4
Free T4: 1.58 ng/dL (ref 0.82–1.77)
TSH: 0.846 u[IU]/mL (ref 0.450–4.500)

## 2023-10-27 LAB — HEMOGLOBIN A1C
Est. average glucose Bld gHb Est-mCnc: 134 mg/dL
Hgb A1c MFr Bld: 6.3 % — ABNORMAL HIGH (ref 4.8–5.6)

## 2023-10-27 LAB — T3: T3, Total: 111 ng/dL (ref 71–180)

## 2023-10-29 ENCOUNTER — Telehealth: Payer: Self-pay | Admitting: *Deleted

## 2023-10-29 ENCOUNTER — Other Ambulatory Visit: Payer: Self-pay

## 2023-10-29 ENCOUNTER — Ambulatory Visit (HOSPITAL_BASED_OUTPATIENT_CLINIC_OR_DEPARTMENT_OTHER): Payer: Self-pay | Admitting: Family Medicine

## 2023-10-29 ENCOUNTER — Other Ambulatory Visit (HOSPITAL_COMMUNITY): Payer: Self-pay

## 2023-10-29 MED ORDER — SACUBITRIL-VALSARTAN 97-103 MG PO TABS
1.0000 | ORAL_TABLET | Freq: Two times a day (BID) | ORAL | 0 refills | Status: DC
  Filled 2023-10-29: qty 60, 30d supply, fill #0

## 2023-10-29 NOTE — Telephone Encounter (Signed)
-----   Message from Callie E Goodrich sent at 10/28/2023  2:05 PM EDT ----- Triage, okay to refill his Entresto  but I would only give him a 30 day supply. He is overdue for an office visit with us . He was supposed to follow-up with us  in March but no showed this appointment. He needs an office visit. I don't think I have anything until October so it can be with another APP or Dr. Loni.   Thank you! Callie ----- Message ----- From: Cleatus Dorcas SAUNDERS, [facility] Sent: 10/24/2023  10:11 AM EDT To: Callie E Goodrich, PA-C  I am not sure. ----- Message ----- From: Goodrich, Callie E, PA-C Sent: 10/22/2023   4:10 PM EDT To: Dorcas SAUNDERS Cleatus, RPH  Hi,  Dr. Hillman is not one of the Cardiologist I work for. Did she switch practices?  Thanks! Callie ----- Message ----- From: Cleatus Dorcas SAUNDERS, [facility] Sent: 10/22/2023   4:04 PM EDT To: Callie E Goodrich, PA-C  Hi,   This patient is requesting prescription refill for Entresto , but has not seen by  cardiologist Dr. Hillman in a while. Could you please assist with refill?  Thanks,  Dorcas Cleatus, PharmD Clinical Pharmacist Cell: [phone]

## 2023-10-30 NOTE — Telephone Encounter (Signed)
 Jordan Sparks,  Thanks for the heads up. It does that because the risk of hyperkalemia with both agents but he has been on this for a while and potassium was stable at last check. Thanks for letting me know though. And thank you for getting him a follow-up visit!  Best, Lidie Glade

## 2023-11-01 ENCOUNTER — Ambulatory Visit: Admitting: Orthopedic Surgery

## 2023-11-01 DIAGNOSIS — M4712 Other spondylosis with myelopathy, cervical region: Secondary | ICD-10-CM | POA: Diagnosis not present

## 2023-11-01 NOTE — Progress Notes (Signed)
 Orthopedic Spine Surgery Office Note   Assessment: Patient is a 48 y.o. male with imbalance and decreased fine motor skill ability in his hands. His MRI shows central stenosis at C3/4 and C5/6 with T2 cord signal change at 3/4. 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 In order to try to decrease his chances of adjacent segment disease especially in the setting of a skip level, discussed C3/4 and C5/6 CDA. If I cannot get in the CDA at C3/4 due to his chin or if it does not look like it is abutting the endplates well, will plan to convert that level to an ACDF at that level. He does not have significant neck pain and he does not have significant disc height loss or facet arthropathy. Normally, I would make patients quit smoking and have a lower A1c prior to surgery but given the nature or myelopathy and potential for progressive loss of function, will continue with plan for surgery -Patient will next be seen at date of surgery      The patient has signs and symptoms consistent with cervical myeloradiculopathy. The most common natural history of cervical myelopathy was explained to the patient again. Given this course, operative management in the form of C3/4 and C5/6 cervical disc replacement was recommended. The risks, including but not limited to pseudarthrosis, dysphagia, hematoma, airway compromise, recurrent laryngeal nerve injury, esophageal perforation, durotomy, spinal cord injury, nerve root injury, persistent pain, adjacent segment disease, infection, bleeding, hardware failure, vascular injury, heart attack, death, stroke, fracture, and need for additional procedures were discussed with the patient. I explained that his risks would be higher with his active nicotine  use and elevated A1c, particularly infection and wound healing complications. The benefit of surgery would be  preventing progression of the myelopathy and not to reverse any myelopathic symptoms. The alternatives to surgical management would be continued monitoring, physical therapy, over-the-counter pain medications, ambulatory aids, and activity modification. I reiterated that these modalities would not change the natural history of the disease and that is why operative management has been recommended. All the patient's questions were answered to his satisfaction. After this discussion, the patient expressed understanding and elected to proceed with surgical intervention.      ___________________________________________________________________________     History:   Patient is a 48 y.o. male who presents today for follow up on his cervical spine. He was lost to follow up after discussing surgery as the treatment recommendation for myelopathy. He comes in today with continue imbalance and dysfunction with his hands. He has noticed worsening coordination with his hands. Has great difficulty buttoning shirts and drops objects regularly. He feels his balance is off when walking and he can no longer stand on one leg when getting dressing. He is not using an ambulatory assistive devices. Neck feels stiff but no pain. No radiating arm pain.   Treatments tried: PT, gabapentin , tylenol , intramuscular steroid injection     Physical Exam:   General: no acute distress, appears stated age Neurologic: alert, answering questions appropriately, following commands Respiratory: unlabored breathing on room air, symmetric chest rise Psychiatric: appropriate affect, normal cadence to speech     MSK (spine):   -Strength exam  Left                  Right Grip strength                5/5                  5/5 Interosseus                  5/5                  5/5 Wrist extension            5/5                  5/5 Wrist flexion                 5/5                   5/5 Elbow flexion                5/5                  4+/5 Deltoid                          5/5                  5/5   EHL                              5/5                  5/5 TA                                 5/5                  5/5 GSC                             5/5                  5/5 Knee extension            5/5                  5/5 Hip flexion                    5/5                  5/5   -Sensory exam                           Sensation intact to light touch in L3-S1 nerve distributions of bilateral lower extremities             Sensation intact to light touch in C5-T1 nerve distributions of bilateral upper extremities   -Brachioradialis DTR: 3/4 on the left, 2/4 on the right -Biceps DTR: 3/4 on the left, 2/4 on the right -Triceps DTR: 2/4 on the left, 2/4 on the right -Triceps DTR: 2/4 on the left, 2/4 on the right -Achilles DTR: 2/4 on the left, 2/4 on the right -Patellar tendon DTR: 3/4 on the left, 3/4 on the right   -Spurling:  negative bilaterally  -Hoffman sign: positive on the left, negative on the right -Clonus: no beats bilaterally -Interosseous wasting: none seen -Grip and release test: positive  -Romberg: positive -Gait: normal   Imaging: XRs of the cervical spine from 04/26/2023 were previously independently reviewed and interpreted, showing disc height loss at C3/4. No other significant degenerative changes. No fracture or dislocation seen. No evidence of instability on flexion/extension views. Less than 50% disc height loss at C3/4 and C5/6 when compared to other levels. No significant facet arthropathy.    MRI of the cervical spine from 03/25/2023 was previously independently reviewed and interpreted, showing disc bulge with central stenosis and T2 cord signal change at C3/4. There is foraminal stenosis on the left at C3/4. Central and foraminal stenosis on the right at C5/6.      Patient name: Jordan Sparks Patient MRN: [id number] Date of  visit: 11/01/23

## 2023-11-06 ENCOUNTER — Other Ambulatory Visit (HOSPITAL_COMMUNITY): Payer: Self-pay

## 2023-11-09 ENCOUNTER — Ambulatory Visit: Admitting: Physical Medicine and Rehabilitation

## 2023-11-09 ENCOUNTER — Other Ambulatory Visit: Payer: Self-pay

## 2023-11-09 VITALS — BP 170/119 | HR 99

## 2023-11-09 DIAGNOSIS — M5416 Radiculopathy, lumbar region: Secondary | ICD-10-CM | POA: Diagnosis not present

## 2023-11-09 MED ORDER — METHYLPREDNISOLONE ACETATE 40 MG/ML IJ SUSP
40.0000 mg | Freq: Once | INTRAMUSCULAR | Status: AC
  Administered 2023-11-09: 40 mg

## 2023-11-09 NOTE — Procedures (Signed)
 Lumbar Epidural Steroid Injection - Interlaminar Approach with Fluoroscopic Guidance  Patient: Jordan Sparks      Date of Birth: [birth year] MRN: [id number] PCP: de [place], Raymond J, MD      Visit Date: 11/09/2023   Universal Protocol:     Consent Given By: the patient  Position: PRONE  Additional Comments: Vital signs were monitored before and after the procedure. Patient was prepped and draped in the usual sterile fashion. The correct patient, procedure, and site was verified.   Injection Procedure Details:   Procedure diagnoses: Lumbar radiculopathy [M54.16]   Meds Administered:  Meds ordered this encounter  Medications   methylPREDNISolone  acetate (DEPO-MEDROL ) injection 40 mg     Laterality: Right  Location/Site:  L5-S1  Needle: 4.5 in., 20 ga. Tuohy  Needle Placement: Paramedian epidural  Findings:   -Comments: Excellent flow of contrast into the epidural space.  Procedure Details: Using a paramedian approach from the side mentioned above, the region overlying the inferior lamina was localized under fluoroscopic visualization and the soft tissues overlying this structure were infiltrated with 4 ml. of 1% Lidocaine  without Epinephrine. The Tuohy needle was inserted into the epidural space using a paramedian approach.   The epidural space was localized using loss of resistance along with counter oblique bi-planar fluoroscopic views.  After negative aspirate for air, blood, and CSF, a 2 ml. volume of Isovue-250 was injected into the epidural space and the flow of contrast was observed. Radiographs were obtained for documentation purposes.    The injectate was administered into the level noted above.   Additional Comments:  The patient tolerated the procedure well Dressing: 2 x 2 sterile gauze and Band-Aid    Post-procedure details: Patient was observed during the procedure. Post-procedure instructions were reviewed.  Patient left the clinic in  stable condition.

## 2023-11-09 NOTE — Progress Notes (Signed)
 Jordan Sparks - 48 y.o. male MRN [id number]  Date of birth: Aug 11, 1975  Office Visit Note: Visit Date: 11/09/2023 PCP: de [place], Raymond J, MD Referred by: de [place], Raymond J, MD  Subjective: Chief Complaint  Patient presents with   Lower Back - Pain   HPI:  Jordan Sparks is a 48 y.o. male who comes in today at the request of Duwaine Pouch, FNP for planned Right L5-S1 Lumbar Interlaminar epidural steroid injection with fluoroscopic guidance.  The patient has failed conservative care including home exercise, medications, time and activity modification.  This injection will be diagnostic and hopefully therapeutic.  Please see requesting physician notes for further details and justification.   ROS Otherwise per HPI.  Assessment & Plan: Visit Diagnoses:    ICD-10-CM   1. Lumbar radiculopathy  M54.16 XR C-ARM NO REPORT    Epidural Steroid injection    methylPREDNISolone  acetate (DEPO-MEDROL ) injection 40 mg      Plan: No additional findings.   Meds & Orders:  Meds ordered this encounter  Medications   methylPREDNISolone  acetate (DEPO-MEDROL ) injection 40 mg    Orders Placed This Encounter  Procedures   XR C-ARM NO REPORT   Epidural Steroid injection    Follow-up: Return for visit to requesting provider as needed.   Procedures: No procedures performed  Lumbar Epidural Steroid Injection - Interlaminar Approach with Fluoroscopic Guidance  Patient: Jordan Sparks      Date of Birth: November 11, 1975 MRN: [id number] PCP: de [place], Raymond J, MD      Visit Date: 11/09/2023   Universal Protocol:     Consent Given By: the patient  Position: PRONE  Additional Comments: Vital signs were monitored before and after the procedure. Patient was prepped and draped in the usual sterile fashion. The correct patient, procedure, and site was verified.   Injection Procedure Details:   Procedure diagnoses: Lumbar radiculopathy [M54.16]   Meds  Administered:  Meds ordered this encounter  Medications   methylPREDNISolone  acetate (DEPO-MEDROL ) injection 40 mg     Laterality: Right  Location/Site:  L5-S1  Needle: 4.5 in., 20 ga. Tuohy  Needle Placement: Paramedian epidural  Findings:   -Comments: Excellent flow of contrast into the epidural space.  Procedure Details: Using a paramedian approach from the side mentioned above, the region overlying the inferior lamina was localized under fluoroscopic visualization and the soft tissues overlying this structure were infiltrated with 4 ml. of 1% Lidocaine  without Epinephrine. The Tuohy needle was inserted into the epidural space using a paramedian approach.   The epidural space was localized using loss of resistance along with counter oblique bi-planar fluoroscopic views.  After negative aspirate for air, blood, and CSF, a 2 ml. volume of Isovue-250 was injected into the epidural space and the flow of contrast was observed. Radiographs were obtained for documentation purposes.    The injectate was administered into the level noted above.   Additional Comments:  The patient tolerated the procedure well Dressing: 2 x 2 sterile gauze and Band-Aid    Post-procedure details: Patient was observed during the procedure. Post-procedure instructions were reviewed.  Patient left the clinic in stable condition.   Clinical History: MRI LUMBAR SPINE WITHOUT CONTRAST   TECHNIQUE: Multiplanar, multisequence MR imaging of the lumbar spine was performed. No intravenous contrast was administered.   COMPARISON:  Lumbar spine radiographs 04/14/2021. CTA chest, abdomen, and pelvis 06/18/2020.   FINDINGS: The study is motion degraded, moderately so on the axial sequences.   Segmentation: Standard.  Alignment: Trace retrolisthesis of L4 on L5. Straightening of the normal lumbar lordosis.   Vertebrae: No fracture, suspicious marrow lesion, or significant marrow edema. Mild chronic  degenerative endplate changes at L5-S1.   Conus medullaris and cauda equina: Conus extends to the L1-2 level. Conus and cauda equina appear normal.   Paraspinal and other soft tissues: Unremarkable.   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   T12-L1 and L1-2: Negative.   L2-3: Minimal disc bulging without stenosis.   L3-4: Mild disc bulging without significant stenosis.   L4-5: Mild disc desiccation. Circumferential disc bulging and mild facet hypertrophy result in mild spinal stenosis, mild bilateral lateral recess stenosis, and mild-to-moderate bilateral neural foraminal stenosis.   L5-S1: Disc desiccation. Disc bulging and mild facet hypertrophy result in mild right and moderate left neural foraminal stenosis without spinal stenosis.   IMPRESSION: 1. Motion degraded examination. 2. 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     Electronically Signed   By: Dasie Hamburg M.D.   On: 05/16/2021 18:00     Objective:  VS:  HT:    WT:   BMI:     BP:(!) 170/119  HR:99bpm  TEMP: ( )  RESP:  Physical Exam Vitals and nursing note reviewed.  Constitutional:      General: He is not in acute distress.    Appearance: Normal appearance. He is not ill-appearing.  HENT:     Head: Normocephalic and atraumatic.     Right Ear: External ear normal.     Left Ear: External ear normal.     Nose: No congestion.  Eyes:     Extraocular Movements: Extraocular movements intact.  Cardiovascular:     Rate and Rhythm: Normal rate.     Pulses: Normal pulses.  Pulmonary:     Effort: Pulmonary effort is normal. No respiratory distress.  Abdominal:     General: There is no distension.     Palpations: Abdomen is soft.  Musculoskeletal:        General: No tenderness or signs of injury.      Cervical back: Neck supple.     Right lower leg: No edema.     Left lower leg: No edema.     Comments: Patient has good distal strength without clonus.  Skin:    Findings: No erythema or rash.  Neurological:     General: No focal deficit present.     Mental Status: He is alert and oriented to person, place, and time.     Sensory: No sensory deficit.     Motor: No weakness or abnormal muscle tone.     Coordination: Coordination normal.  Psychiatric:        Mood and Affect: Mood normal.        Behavior: Behavior normal.      Imaging: XR C-ARM NO REPORT Result Date: 11/09/2023 Please see Notes tab for imaging impression.

## 2023-11-09 NOTE — Progress Notes (Signed)
 Pain Scale   Average Pain 7 Patient advising he has chronic lower back pain radiating to right hip. Patient advised to follow up with PCP due to elevated B/p- patient advising he has been on new b/p medication and his PCP is going to see him next week, patient advised to contact PCP.--manual b/p 172/108        +Driver, -BT, -Dye Allergies.

## 2023-11-13 ENCOUNTER — Other Ambulatory Visit: Payer: Self-pay

## 2023-11-13 ENCOUNTER — Other Ambulatory Visit (HOSPITAL_COMMUNITY): Payer: Self-pay

## 2023-11-13 MED ORDER — AMLODIPINE BESYLATE 10 MG PO TABS
10.0000 mg | ORAL_TABLET | Freq: Every day | ORAL | 2 refills | Status: AC
  Filled 2023-11-13: qty 90, 90d supply, fill #0
  Filled 2024-03-04: qty 90, 90d supply, fill #1

## 2023-11-17 NOTE — Progress Notes (Deleted)
 Cardiology Office Note   Date:  11/17/2023  ID:  Cinderella BRAVO Shabazz-McGregor, DOB [birth year], MRN [id number] PCP: de [place], Quintin PARAS, MD  [facility] Providers Cardiologist:  Soyla DELENA Merck, MD    History of Present Illness Traevion E Shabazz-McGregor is a 48 y.o. male with a past medical history of mild nonobstructive CAD, nonischemic cardiomyopathy with improved ejection fraction, hypertension, hyperlipidemia, type 2 diabetes, OSA on CPAP, congenital spinal stenosis, anxiety/depression, obesity, prior cocaine use.  In the past, patient's cardiac care has been complicated by medication noncompliance.  Followed by Dr. Acharya and presents today for follow-up of CHF and hypertension.  Patient had been admitted in 05/2021 after he presented with chest pain and shortness of breath.  High-sensitivity troponin was minimally elevated and flat in the 20s.  Echocardiogram 06/01/21 showed EF 25-30% with moderate LVH, normal RV systolic function, mild MR. Coronary CTA showed a coronary calcium  score of 24 with only minimal soft plaque in the mid LAD. Cardiomyopathy felt to be due to longstanding uncontrolled BP. Renal artery ultrasound showed 1-59% stenosis of bilateral renal arteries.  He was started on GDMT.  Repeat echocardiogram in 08/2021 showed EF had improved to 55%, moderate LVH, grade 2 diastolic dysfunction, normal RV systolic function.  Patient was lost to follow-up from 11/2021 until 03/2023.  At time, patient overall was doing well from a cardiac standpoint but his blood pressure was uncontrolled.  He had issues affording his medications.   Most recently seen in clinic on 04/12/2023.  At that time, BP continue to be mildly elevated.  Carvedilol  was increased to 25 mg twice daily.  He also reported some palpitations and 2-week monitor was ordered but was never completed.  Nonischemic Cardiomyopathy  Hypertensive heart disease - EF was 25-30% in 05/2021.  After being started on GDMT, repeat echo in  08/2021 showed EF 55%, moderate LVH, grade 2 diastolic dysfunction, normal RV systolic function.  Felt that cardiomyopathy was due to uncontrolled hypertension -  - Continue carvedilol  25 mg twice daily, hydralazine  25 mg 3 times daily, Entresto  97-103 mg twice daily, eplerenone  25 mg daily - Patient is currently on dapagliflozin  Pro-metformin  ER 12-1998 mg daily - Creatinine 0.79 and potassium 3.9 in 03/2023   HTN  -  - Sinew amlodipine  10 mg daily, carvedilol  25 mg twice daily, hydralazine  25 mg 3 times daily, Entresto  97-103 mg twice daily, eplerenone  25 mg daily  Coronary Calcifications  - Coronary CTA 05/2021 showed coronary calcium  score of 24, minimal nonobstructive CAD -   Hyperlipidemia - Most recent lipid panel from 04/2021 showed LDL 116 goal LDL less than 70 - Ordered repeat lipid panel, LFTs today - Continue Crestor  20 mg daily  OSA - Continue CPAP use   Type 2 diabetes - A1c 6.3 in 10/2023  - Followed by PCP   ROS: ***  Studies Reviewed      *** Risk Assessment/Calculations {Does this patient have ATRIAL FIBRILLATION?:[phone]} No BP recorded.  {Refresh Note OR Click here to enter BP  :1}***       Physical Exam VS:  There were no vitals taken for this visit.       Wt Readings from Last 3 Encounters:  10/26/23 279 lb (126.6 kg)  06/21/23 277 lb 11.2 oz (126 kg)  04/26/23 266 lb (120.7 kg)    GEN: Well nourished, well developed in no acute distress NECK: No JVD; No carotid bruits CARDIAC: ***RRR, no murmurs, rubs, gallops RESPIRATORY:  Clear to auscultation without rales,  wheezing or rhonchi  ABDOMEN: Soft, non-tender, non-distended EXTREMITIES:  No edema; No deformity   ASSESSMENT AND PLAN ***    {Are you ordering a CV Procedure (e.g. stress test, cath, DCCV, TEE, etc)?   Press F2        :[id number]}  Dispo: ***  Signed, Rollo FABIENE Louder, PA-C

## 2023-11-21 ENCOUNTER — Other Ambulatory Visit: Payer: Self-pay | Admitting: Family Medicine

## 2023-11-21 ENCOUNTER — Other Ambulatory Visit (HOSPITAL_COMMUNITY): Payer: Self-pay

## 2023-11-21 ENCOUNTER — Other Ambulatory Visit: Payer: Self-pay

## 2023-11-21 DIAGNOSIS — E1165 Type 2 diabetes mellitus with hyperglycemia: Secondary | ICD-10-CM

## 2023-11-21 MED ORDER — OZEMPIC (0.25 OR 0.5 MG/DOSE) 2 MG/3ML ~~LOC~~ SOPN
PEN_INJECTOR | SUBCUTANEOUS | 1 refills | Status: DC
  Filled 2023-11-21: qty 3, 28d supply, fill #0
  Filled 2023-12-15: qty 3, 28d supply, fill #1

## 2023-11-29 ENCOUNTER — Ambulatory Visit: Attending: Cardiology | Admitting: Cardiology
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
## 2023-12-13 ENCOUNTER — Other Ambulatory Visit: Payer: Self-pay | Admitting: Student

## 2023-12-13 ENCOUNTER — Other Ambulatory Visit: Payer: Self-pay

## 2023-12-13 ENCOUNTER — Encounter (HOSPITAL_COMMUNITY): Payer: Self-pay | Admitting: Psychiatry

## 2023-12-13 ENCOUNTER — Telehealth (INDEPENDENT_AMBULATORY_CARE_PROVIDER_SITE_OTHER): Admitting: Psychiatry

## 2023-12-13 ENCOUNTER — Other Ambulatory Visit (HOSPITAL_COMMUNITY): Payer: Self-pay

## 2023-12-13 DIAGNOSIS — Z79899 Other long term (current) drug therapy: Secondary | ICD-10-CM

## 2023-12-13 DIAGNOSIS — F1290 Cannabis use, unspecified, uncomplicated: Secondary | ICD-10-CM

## 2023-12-13 DIAGNOSIS — F3162 Bipolar disorder, current episode mixed, moderate: Secondary | ICD-10-CM

## 2023-12-13 DIAGNOSIS — Z5181 Encounter for therapeutic drug level monitoring: Secondary | ICD-10-CM | POA: Diagnosis not present

## 2023-12-13 MED ORDER — DULOXETINE HCL 60 MG PO CPEP
60.0000 mg | ORAL_CAPSULE | Freq: Two times a day (BID) | ORAL | 0 refills | Status: AC
  Filled 2023-12-13 – 2024-01-31 (×2): qty 180, 90d supply, fill #0

## 2023-12-13 MED ORDER — DIVALPROEX SODIUM ER 500 MG PO TB24
500.0000 mg | ORAL_TABLET | Freq: Every evening | ORAL | 0 refills | Status: AC
  Filled 2024-03-22: qty 90, 90d supply, fill #0

## 2023-12-13 MED ORDER — HYDROXYZINE HCL 25 MG PO TABS
12.5000 mg | ORAL_TABLET | Freq: Two times a day (BID) | ORAL | 1 refills | Status: AC | PRN
  Filled 2023-12-13: qty 60, 30d supply, fill #0

## 2023-12-13 MED ORDER — ARIPIPRAZOLE 5 MG PO TABS
5.0000 mg | ORAL_TABLET | Freq: Every day | ORAL | 0 refills | Status: AC
  Filled 2024-01-10: qty 30, 30d supply, fill #0
  Filled 2024-02-07: qty 30, 30d supply, fill #1
  Filled 2024-03-22: qty 30, 30d supply, fill #2

## 2023-12-13 MED ORDER — BUPROPION HCL ER (XL) 150 MG PO TB24
150.0000 mg | ORAL_TABLET | ORAL | 0 refills | Status: AC
  Filled 2023-12-13: qty 90, 90d supply, fill #0
  Filled 2024-01-10: qty 30, 30d supply, fill #0
  Filled 2024-02-07: qty 30, 30d supply, fill #1
  Filled 2024-03-22: qty 30, 30d supply, fill #2

## 2023-12-13 NOTE — Patient Instructions (Signed)

## 2023-12-14 ENCOUNTER — Other Ambulatory Visit (HOSPITAL_COMMUNITY): Payer: Self-pay

## 2023-12-14 ENCOUNTER — Other Ambulatory Visit: Payer: Self-pay

## 2023-12-14 MED ORDER — SACUBITRIL-VALSARTAN 97-103 MG PO TABS
1.0000 | ORAL_TABLET | Freq: Two times a day (BID) | ORAL | 0 refills | Status: AC
  Filled 2023-12-14: qty 180, 90d supply, fill #0

## 2023-12-16 ENCOUNTER — Other Ambulatory Visit (HOSPITAL_COMMUNITY): Payer: Self-pay

## 2023-12-20 ENCOUNTER — Ambulatory Visit (INDEPENDENT_AMBULATORY_CARE_PROVIDER_SITE_OTHER): Admitting: Family Medicine

## 2023-12-20 ENCOUNTER — Encounter (HOSPITAL_BASED_OUTPATIENT_CLINIC_OR_DEPARTMENT_OTHER): Payer: Self-pay | Admitting: Family Medicine

## 2023-12-20 VITALS — BP 138/89 | HR 92 | Ht 70.0 in | Wt 261.2 lb

## 2023-12-20 DIAGNOSIS — Z01818 Encounter for other preprocedural examination: Secondary | ICD-10-CM | POA: Diagnosis not present

## 2023-12-20 DIAGNOSIS — Z23 Encounter for immunization: Secondary | ICD-10-CM | POA: Diagnosis not present

## 2023-12-20 NOTE — Assessment & Plan Note (Signed)
 Patient is medically cleared to proceed with planned surgery.  Recommend having cardiac clearance with cardiology. In regards to medication management, Ozempic  can be held for 7 days prior to surgery.  Management of aspirin  deferred to cardiology.

## 2023-12-20 NOTE — Patient Instructions (Signed)

## 2023-12-20 NOTE — Progress Notes (Signed)
    Procedures performed today:    None.  Independent interpretation of notes and tests performed by another provider:   None.  Brief History, Exam, Impression, and Recommendations:    Jordan Sparks is a 48 y.o. presenting for preoperative evaluation/clearance. The planned procedure is C3-4 and C5-6 cervical disc arthroplasty with the treatment goal of improved function. Cardiac risk for planned procedure is Intermediate (1 to 5%) - intraperitoneal or intrathoracic surgery, carotid endarterectomy, head and neck surgery, orthopedic surgery, prostate surgery  Signs or symptoms of cardiovascular disease? No New or unstable cardiopulmonary signs or symptoms? No Urinary symptoms or invasive urologic procedure? No  BP 138/89 (BP Location: Right Arm, Patient Position: Sitting, Cuff Size: Large)   Pulse 92   Ht 5' 10 (1.778 m)   Wt 261 lb 3.2 oz (118.5 kg)   SpO2 96%   BMI 37.48 kg/m   Exam: Patient in no acute distress, vital signs stable Cardiovascular exam with regular rate and rhythm Lungs clear to auscultation bilaterally  Preoperative clearance Patient is medically cleared to proceed with planned surgery.  Recommend having cardiac clearance with cardiology. In regards to medication management, Ozempic  can be held for 7 days prior to surgery.  Management of aspirin  deferred to cardiology.  Plan for follow-up as needed, has appointment scheduled in about 2 months   ___________________________________________ Bell Carbo de [place], MD, ABFM, CAQSM Primary Care and Sports Medicine [facility]

## 2023-12-24 ENCOUNTER — Ambulatory Visit (HOSPITAL_COMMUNITY): Admitting: Licensed Clinical Social Worker

## 2023-12-24 ENCOUNTER — Encounter (HOSPITAL_COMMUNITY): Payer: Self-pay

## 2023-12-24 ENCOUNTER — Encounter (HOSPITAL_BASED_OUTPATIENT_CLINIC_OR_DEPARTMENT_OTHER): Payer: Self-pay | Admitting: *Deleted

## 2023-12-26 ENCOUNTER — Other Ambulatory Visit (HOSPITAL_COMMUNITY)

## 2023-12-27 ENCOUNTER — Ambulatory Visit (HOSPITAL_COMMUNITY): Admitting: Licensed Clinical Social Worker

## 2023-12-27 ENCOUNTER — Telehealth (HOSPITAL_BASED_OUTPATIENT_CLINIC_OR_DEPARTMENT_OTHER): Payer: Self-pay

## 2023-12-27 DIAGNOSIS — F3162 Bipolar disorder, current episode mixed, moderate: Secondary | ICD-10-CM

## 2023-12-27 NOTE — Telephone Encounter (Signed)
   Pre-operative Risk Assessment    Patient Name: Jordan Sparks  DOB: 1975-06-25 MRN: [id number]   Date of last office visit: 04/12/23 with Jadine  Date of next office visit: 01/10/24 with Lucien   Request for Surgical Clearance    Procedure:  C3-4, C4-5 cervical disc arthroplasty  Date of Surgery:  Clearance TBD                                Surgeon:  Not indicated Surgeon's Group or Practice Name:  [facility] Phone number:  [phone] Fax number:  [phone]   Type of Clearance Requested:   - Medical  - Pharmacy:  Hold Aspirin  for 7 days. (Aspirin  is not recorded in patient chart, reported from surgeons office)   Type of Anesthesia:  General    Additional requests/questions:    Signed, Augustin JONETTA Daring   12/27/2023, 7:53 AM

## 2023-12-27 NOTE — Telephone Encounter (Signed)
   Name: Jordan Sparks  DOB: 12-Jan-1976  MRN: [id number]  Primary Cardiologist: Soyla DELENA Merck, MD  Chart reviewed as part of pre-operative protocol coverage. Because of [name]'s past medical history and time since last visit, he will require a follow-up in-office visit in order to better assess preoperative cardiovascular risk.  Pre-op covering staff: - Please schedule appointment and call patient to inform them. If patient already had an upcoming appointment within acceptable timeframe, please add pre-op clearance to the appointment notes so provider is aware. - Please contact requesting surgeon's office via preferred method (i.e, phone, fax) to inform them of need for appointment prior to surgery.  Shelby Peltz D Dong Nimmons, NP  12/27/2023, 8:39 AM

## 2023-12-27 NOTE — Telephone Encounter (Signed)
 Pt has appt with Orren Fabry, [facility] 01/10/24.

## 2023-12-27 NOTE — Progress Notes (Signed)
 THERAPIST PROGRESS NOTE  Virtual Visit via Video Note  I connected with Camerin E Shabazz-McGregor on 12/27/23 at  2:00 PM EDT by a video enabled telemedicine application and verified that I am speaking with the correct person using two identifiers.  Location: Patient: [facility]  Provider: Providers Home    I discussed the limitations of evaluation and management by telemedicine and the availability of in person appointments. The patient expressed understanding and agreed to proceed.    I discussed the assessment and treatment plan with the patient. The patient was provided an opportunity to ask questions and all were answered. The patient agreed with the plan and demonstrated an understanding of the instructions.   The patient was advised to call back or seek an in-person evaluation if the symptoms worsen or if the condition fails to improve as anticipated.  I provided 45 minutes of non-face-to-face time during this encounter.   Juliene GORMAN Patee, LCSW   Participation Level: Active  Behavioral Response: CasualAlertAnxious and Depressed  Type of Therapy: Individual Therapy  Treatment Goals addressed:  Active     Anxiety     LTG: Norvell will score less than 5 on the Generalized Anxiety Disorder 7 Scale (GAD-7)  (Completed/Met)     Start:  10/04/23    Expected End:  03/13/24    Resolved:  12/27/23      STG: Awad will attend at least 80% of scheduled IOP sessions  (Not Applicable)     Start:  10/04/23    Expected End:  03/13/24    Resolved:  12/27/23      STG: Remijio will participate in at least 80% of scheduled individual psychotherapy sessions  (Progressing)     Start:  10/04/23    Expected End:  03/13/24         STG: Rogerick will practice problem solving skills 3 times per week for the next 4 weeks.  (Progressing)     Start:  10/04/23    Expected End:  03/13/24         STG: Rydge will reduce frequency of avoidant behaviors by 50% as evidenced by self-report  in therapy sessions (Progressing)     Start:  10/04/23    Expected End:  03/13/24         Review results of GAD-7 with Shayne to track progress     Start:  10/04/23         Discuss risks and benefits of medication treatment options for this problem and prescribe as indicated (Completed)     Start:  10/04/23    End:  12/27/23      Encourage Jahaziel to take psychotropic medication(s) as prescribed (Completed)     Start:  10/04/23    End:  12/27/23      Work with Tremar to track symptoms, triggers, and/or skill use through a mood chart, diary card, or journal (Completed)     Start:  10/04/23    End:  12/27/23      Perform psychoeducation regarding anxiety disorders (Completed)     Start:  10/04/23    End:  12/27/23      Provide Jayvian with educational information and reading material on anxiety, its causes, and symptoms.  (Completed)     Start:  10/04/23    End:  12/27/23         ProgressTowards Goals: Progressing  Interventions: CBT, Motivational Interviewing, and Supportive   Suicidal/Homicidal: Nowithout intent/plan  Therapist Response:     Alyn  was alert and oriented x 5.  He was pleasant, cooperative, maintained good eye contact.  Harsh engaged well in therapy session was dressed casually.  Patient presented with euthymic mood\affect.  Patient comes in today stating no new stressors to report.  Patient states an overall decrease in bipolar symptoms.  He credits this to taking his medications consistently with an overall decrease in tension, worry, and insomnia.  LCSW reported to patient that as of December 14 LCSW would be moving to [place] office through [employer].  At this time patient feels that he is in a good place with just his medication management and would like to follow-up with LCSW 1 more time but if everything is still going the same way he would like to discharge from therapeutic services in favor of just medication management.   Intervention:  LCSW utilized solution-focused therapy.  LCSW utilized psycho analytic therapy for patient to express thoughts, feelings and emotions and nonjudgmental environment.  LCSW utilized supportive therapy for praise and encouragement.  LCSW utilized strength-based therapy to review goals for future therapeutic sessions.  LCSW utilized person centered therapy for empowerment.  Plan: Return again in 4 weeks.  Diagnosis: Bipolar 1 disorder, mixed, moderate (HCC)  Collaboration of Care: Other Individual therapy    Patient/Guardian was advised Release of Information must be obtained prior to any record release in order to collaborate their care with an outside provider. Patient/Guardian was advised if they have not already done so to contact the registration department to sign all necessary forms in order for us  to release information regarding their care.   Consent: Patient/Guardian gives verbal consent for treatment and assignment of benefits for services provided during this visit. Patient/Guardian expressed understanding and agreed to proceed.   Juliene GORMAN Patee, LCSW 12/27/2023

## 2024-01-01 ENCOUNTER — Other Ambulatory Visit: Payer: Self-pay

## 2024-01-08 NOTE — Progress Notes (Unsigned)
 Cardiology Office Note   Date:  01/10/2024  ID:  Jordan Sparks, DOB [birth year], MRN [id number] PCP: de [place], Raymond J, MD  [facility] Providers Cardiologist:  Soyla DELENA Merck, MD   History of Present Illness Jordan Sparks is a 48 y.o. male ith a history of mild non-obstructive CAD noted on coronary CTA in 05/2021, non-ischemic cardiomyopathy/ chronic systolic CHF with EF as low as 25-30% in 05/2021 but normalized to 55% on Echo in 08/2021,  uncontrolled hypertension, hyperlipidemia, type 2 diabetes mellitus, obstructive sleep apnea on CPAP, congenital spinal stenosis, anxiety/depression, mood disorder, obesity, polysubstance abuse (including prior cocaine use), and medication non-compliance who is followed by Dr. Merck and presents today for follow up of CHF and hypertension.   Patient was admitted in 05/2021 after presenting with chest pain and shortness of breath. High-sensitivity troponin was minimally elevated and flat in the 20s. Echo showed LVEF of 25-30% with global hypokinesis and moderate LVH as well as normal RV and mild MR. Coronary CTA showed a coronary calcium  score of 24 with only minimal soft plaque in the mid LAD. Cardiomyopathy felt to be due to longstanding uncontrolled BP. Renal artery ultrasound showed 1-59% stenosis of bilateral renal arteries. He was started on GDMT. Unfortunately, medication compliance has been an issue. However, repeat Echo in 08/2021 showed improvement of LVEF to 55% with moderate LVH, grade 2 diastolic dysfunction, and only trivial MR.    He was last seen by me in 11/2021 at which time he was doing well from a cardiac standpoint. He described one episode of chest pain in setting of significant anxiety as well as some palpitations in setting of panic attacks but was otherwise doing well.   He was seen at an Urgent Care in 11/2022 for right sided neck pain and intermittent left sided chest pain. Pain was felt to be musculoskeletal  in nature after lifting a heavy box. He was given a one-time dose steroid dose and Flexeril .    He was recently seen by his PCP on 03/01/2023 at which time BP was elevated at 160/101. However, he had not been taking his medications regularly largely due to financial difficulties. He continue on Amlodipine  10mg  and Entresto  97-103mg  twice daily and encouraged to take as prescribed.   Patient presents today for follow-up. He states he is doing well from a cardiac standpoint other than his BP being uncontrolled.  He had 1 isolated episode of chest pain that lasted a couple hours last month but he thinks this was related to something he ate.  It improved with belching.  He denies any other episodes of chest pain.  No exertional chest pain.  No shortness of breath, orthopnea, PND.  He notes occasional lower extremity swelling mostly around his ankles but nothing significant.  No palpitations, lightheadedness, dizziness, syncope.  He had a high BP and was taking his medication as prescribed.   Today, he has a hx of hypertension and coronary artery disease with uncontrolled blood pressure.  He is on amlodipine  10 mg daily, carvedilol  25 mg twice daily, Entresto  97/103 mg twice daily, and hydralazine  25 mg twice daily. Despite this, his blood pressure remains uncontrolled. He struggles with the three times daily dosing of hydralazine , taking it only twice daily. He has reduced salt intake and is mindful of his diet due to diabetes, yet his blood pressure remains elevated. 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  He experiences nighttime pain described as a '  knot' that resolves upon getting up. This pain does not occur during the day or with activity. A CT scan of his coronary arteries in 2023 showed no significant issues. He has not had recent lab work and missed an appointment for it.  He is preparing for neck surgery and has not had any stents placed. 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 He has a family history of hypertension and is on Ozempic  for diabetes management, being cautious about his diet to prevent blood sugar spikes.   Reports no shortness of breath nor dyspnea on exertion. Reports no chest pain, pressure, or tightness. No edema, orthopnea, PND. Reports no palpitations.   Patient agreed to [facility] recording.    ROS: pertinent ROS in HPI  Studies Reviewed EKG Interpretation Date/Time:  Thursday January 10 2024 10:56:28 EDT Ventricular Rate:  92 PR Interval:  194 QRS Duration:  100 QT Interval:  362 QTC Calculation: 447 R Axis:   77  Text Interpretation: Normal sinus rhythm Minimal voltage criteria for LVH, may be normal variant ( Sokolow-Lyon ) When compared with ECG of 12-Apr-2023 08:41, No significant change was found Confirmed by Lucien Blanc [phone]) on 01/10/2024 11:39:47 AM    Echocardiogram 05/31/2021: Impressions:  1. Left ventricular ejection fraction, by estimation, is 25 to 30%. The  left ventricle has severely decreased function. The left ventricle  demonstrates global hypokinesis. There is moderate concentric left  ventricular hypertrophy. Indeterminate  diastolic filling due to E-A fusion.   2. Right ventricular systolic function is normal. The right ventricular  size is normal. Tricuspid regurgitation signal is inadequate for assessing  PA pressure.   3. The mitral valve is grossly normal. Mild mitral valve regurgitation.  No evidence of mitral stenosis.   4. The aortic valve is tricuspid. Aortic valve regurgitation is not  visualized. No aortic stenosis is present.   5. The inferior vena cava is dilated in size with <50% respiratory  variability, suggesting right atrial pressure of 15 mmHg.  _______________   Renal Artery Ultrasound 06/01/2021: Summary:  Renal:  - Right: 1-59% stenosis of the right renal artery. Normal right resistive Index. Normal size right kidney.  - Left:  1-59%  stenosis of the left renal artery. Normal left resistive Index. Normal size of left kidney.    Mesenteric:  Areas of limited visceral study include mesenteric arteries and left renal  artery.  _______________   Coronary CTA 06/01/2021: Impressions: 1. Coronary calcium  score of 24. Elevated based on age, race, and gender. 2. Normal coronary origin with left dominance. 3. CAD-RADS 1. Minimal non-obstructive CAD (1-24%). Consider non-atherosclerotic causes of chest pain. Consider preventive therapy and risk factor modification. _______________   Echocardiogram 09/09/2021: Impressions:  1. Left ventricular ejection fraction, by estimation, is 55%. The left  ventricle has normal function. The left ventricle has no regional wall  motion abnormalities. There is moderate concentric left ventricular  hypertrophy. Left ventricular diastolic  parameters are consistent with Grade II diastolic dysfunction  (pseudonormalization).   2. Right ventricular systolic function is normal. The right ventricular  size is normal.   3. Left atrial size was mild to moderately dilated.   4. The mitral valve is normal in structure. Trivial mitral valve  regurgitation. No evidence of mitral stenosis.   5. The aortic valve is normal in structure. Aortic valve regurgitation is  not visualized. No aortic stenosis is present.   6. Aortic dilatation noted. There is borderline dilatation  of the  ascending aorta, measuring 38 mm.   7. The inferior vena cava is normal in size with greater than 50%  respiratory variability, suggesting right atrial pressure of 3 mmHg.    Physical Exam VS:  BP (!) 153/99   Pulse 92   Ht 5' 10 (1.778 m)   Wt 262 lb (118.8 kg)   SpO2 95%   BMI 37.59 kg/m        Wt Readings from Last 3 Encounters:  01/10/24 262 lb (118.8 kg)  12/20/23 261 lb 3.2 oz (118.5 kg)  10/26/23 279 lb (126.6 kg)    GEN: Well nourished, well developed in no acute distress NECK: No JVD; No carotid  bruits CARDIAC: RRR, no murmurs, rubs, gallops RESPIRATORY:  Clear to auscultation without rales, wheezing or rhonchi  ABDOMEN: Soft, non-tender, non-distended EXTREMITIES:  No edema; No deformity   ASSESSMENT AND PLAN  Resistant essential hypertension Hypertension remains uncontrolled despite current regimen. Compliance with hydralazine  is challenging. No diuretic in use. - Increase hydralazine  to 50 mg BID. - Initiate HCTZ 12.5 mg daily. - Recommend purchasing a home blood pressure cuff for monitoring. - Refer to hypertension specialist, Dr. Raford, for further management.  Chronic systolic heart failure/Non-ischemic CM The patient is on multiple medications, including amlodipine , carvedilol , and Entresto , but these have not been effective in controlling the condition. No acute exacerbations.  Atherosclerotic heart disease of native coronary artery Previous CT scan showed calcium  score of 24. Pre-surgical clearance required due to upcoming neck surgery and aspirin  hold. - Order lipid panel, CMP, and CBC. - Order CT scan of coronary arteries for pre-surgical clearance.  Chest pain, evaluation for cardiac etiology Intermittent chest pain possibly musculoskeletal. Concern for cardiac etiology due to upcoming surgery and aspirin  hold. - Order CT scan of coronary arteries. - Perform labs including lipid panel, CMP, and CBC.  Hyperlipidemia Previous LDL levels were high. Current lipid panel needed. - Order lipid panel.  Type 2 diabetes mellitus Managed with dietary modifications and Ozempic .   Palpitations Not addressed today      Dispo: Please return ina few weeks to review testing.  Signed, Orren LOISE Fabry, PA-C

## 2024-01-10 ENCOUNTER — Encounter: Payer: Self-pay | Admitting: Physician Assistant

## 2024-01-10 ENCOUNTER — Other Ambulatory Visit (HOSPITAL_COMMUNITY): Payer: Self-pay

## 2024-01-10 ENCOUNTER — Other Ambulatory Visit: Payer: Self-pay

## 2024-01-10 ENCOUNTER — Ambulatory Visit: Attending: Internal Medicine | Admitting: Physician Assistant

## 2024-01-10 VITALS — BP 153/99 | HR 92 | Ht 70.0 in | Wt 262.0 lb

## 2024-01-10 DIAGNOSIS — R002 Palpitations: Secondary | ICD-10-CM | POA: Diagnosis not present

## 2024-01-10 DIAGNOSIS — I10 Essential (primary) hypertension: Secondary | ICD-10-CM | POA: Insufficient documentation

## 2024-01-10 DIAGNOSIS — I2510 Atherosclerotic heart disease of native coronary artery without angina pectoris: Secondary | ICD-10-CM | POA: Diagnosis not present

## 2024-01-10 DIAGNOSIS — I5022 Chronic systolic (congestive) heart failure: Secondary | ICD-10-CM | POA: Insufficient documentation

## 2024-01-10 DIAGNOSIS — E785 Hyperlipidemia, unspecified: Secondary | ICD-10-CM | POA: Insufficient documentation

## 2024-01-10 DIAGNOSIS — E118 Type 2 diabetes mellitus with unspecified complications: Secondary | ICD-10-CM | POA: Diagnosis present

## 2024-01-10 DIAGNOSIS — G4733 Obstructive sleep apnea (adult) (pediatric): Secondary | ICD-10-CM | POA: Diagnosis present

## 2024-01-10 DIAGNOSIS — I428 Other cardiomyopathies: Secondary | ICD-10-CM | POA: Insufficient documentation

## 2024-01-10 DIAGNOSIS — R079 Chest pain, unspecified: Secondary | ICD-10-CM | POA: Diagnosis not present

## 2024-01-10 LAB — LIPID PANEL

## 2024-01-10 MED ORDER — METOPROLOL TARTRATE 100 MG PO TABS
ORAL_TABLET | ORAL | 0 refills | Status: DC
  Filled 2024-01-10: qty 1, 1d supply, fill #0

## 2024-01-10 MED ORDER — HYDROCHLOROTHIAZIDE 12.5 MG PO CAPS
12.5000 mg | ORAL_CAPSULE | Freq: Every day | ORAL | 3 refills | Status: AC
  Filled 2024-01-10: qty 90, 90d supply, fill #0
  Filled 2024-04-07: qty 90, 90d supply, fill #1

## 2024-01-10 MED ORDER — HYDRALAZINE HCL 50 MG PO TABS
50.0000 mg | ORAL_TABLET | Freq: Two times a day (BID) | ORAL | 3 refills | Status: AC
  Filled 2024-01-10: qty 180, 90d supply, fill #0
  Filled 2024-04-07: qty 180, 90d supply, fill #1

## 2024-01-10 NOTE — Patient Instructions (Addendum)
 Medication Instructions:  START Hydrochlorothiazide 12.5mg  daily (Some pharmacies do not carry 12.5mg  and you may need to cut it in half)  INCREASE hydralazine  to 50mg  twice a day  *If you need a refill on your cardiac medications before your next appointment, please call your pharmacy*  Lab Work: TODAY : CBC, CMET, LIPID panel 1 WEEK : BMET  If you have labs (blood work) drawn today and your tests are completely normal, you will receive your results only by: MyChart Message (if you have MyChart) OR A paper copy in the mail If you have any lab test that is abnormal or we need to change your treatment, we will call you to review the results.  Testing/Procedures: Coronary CTA  Follow-Up: At [facility], you and your health needs are our priority.  As part of our continuing mission to provide you with exceptional heart care, our providers are all part of one team.  This team includes your primary Cardiologist (physician) and Advanced Practice Providers or APPs (Physician Assistants and Nurse Practitioners) who all work together to provide you with the care you need, when you need it.  Your next appointment:   4-6 weeks   Provider:   Orren Fabry, PA-C   We recommend signing up for the patient portal called MyChart.  Sign up information is provided on this After Visit Summary.  MyChart is used to connect with patients for Virtual Visits (Telemedicine).  Patients are able to view lab/test results, encounter notes, upcoming appointments, etc.  Non-urgent messages can be sent to your provider as well.   To learn more about what you can do with MyChart, go to [email or web address].   Other Instructions   Your cardiac CT will be scheduled at one of the below locations:   [facility] [address]  [place], [place] [address]  If scheduled at the [facility] at [address], please enter the parking lot using the [address]  entrance and use the FREE valet service at the patient drop-off area. Enter the building and check-in with registration on the main floor.   Please follow these instructions carefully (unless otherwise directed):  An IV will be required for this test and Nitroglycerin  will be given.  Hold all erectile dysfunction medications at least 3 days (72 hrs) prior to test. (Ie viagra, cialis, sildenafil, tadalafil, etc)   On the Night Before the Test: Be sure to Drink plenty of water. Do not consume any caffeinated/decaffeinated beverages or chocolate 12 hours prior to your test. Do not take any antihistamines 12 hours prior to your test.   On the Day of the Test: Drink plenty of water until 1 hour prior to the test. Do not eat any food 1 hour prior to test. You may take your regular medications prior to the test.  Take metoprolol  (Lopressor ) two hours prior to test. If you take Furosemide /Hydrochlorothiazide/Spironolactone /Chlorthalidone, please HOLD on the morning of the test. Patients who wear a continuous glucose monitor MUST remove the device prior to scanning.       After the Test: Drink plenty of water. After receiving IV contrast, you may experience a mild flushed feeling. This is normal. On occasion, you may experience a mild rash up to 24 hours after the test. This is not dangerous. If this occurs, you can take Benadryl 25 mg, Zyrtec , Claritin , or Allegra and increase your fluid intake. (Patients taking Tikosyn should avoid Benadryl, and may take Zyrtec , Claritin , or Allegra)  If you experience trouble breathing, this can be serious. If it is severe call 911 IMMEDIATELY. If it is mild, please call our office.  We will call to schedule your test 2-4 weeks out understanding that some insurance companies will need an authorization prior to the service being performed.   For more information and frequently asked questions, please visit our website : [email or web address]  For  non-scheduling related questions, please contact the cardiac imaging nurse navigator should you have any questions/concerns: Cardiac Imaging Nurse Navigators Direct Office Dial: [phone]   For scheduling needs, including cancellations and rescheduling, please call Brittany, [phone].

## 2024-01-11 LAB — LIPID PANEL
Chol/HDL Ratio: 2.8 ratio (ref 0.0–5.0)
Cholesterol, Total: 121 mg/dL (ref 100–199)
HDL: 44 mg/dL (ref >39)
LDL Chol Calc (NIH): 60 mg/dL (ref 0–99)
Triglycerides: 85 mg/dL (ref 0–149)
VLDL Cholesterol Cal: 17 mg/dL (ref 5–40)

## 2024-01-11 LAB — COMPREHENSIVE METABOLIC PANEL WITH GFR
ALT: 11 IU/L (ref 0–44)
AST: 12 IU/L (ref 0–40)
Albumin: 4.5 g/dL (ref 4.1–5.1)
Alkaline Phosphatase: 68 IU/L (ref 47–123)
BUN/Creatinine Ratio: 11 (ref 9–20)
BUN: 10 mg/dL (ref 6–24)
Bilirubin Total: 0.5 mg/dL (ref 0.0–1.2)
CO2: 24 mmol/L (ref 20–29)
Calcium: 9.4 mg/dL (ref 8.7–10.2)
Chloride: 101 mmol/L (ref 96–106)
Creatinine, Ser: 0.9 mg/dL (ref 0.76–1.27)
Globulin, Total: 2.7 g/dL (ref 1.5–4.5)
Glucose: 109 mg/dL — ABNORMAL HIGH (ref 70–99)
Potassium: 3.9 mmol/L (ref 3.5–5.2)
Sodium: 141 mmol/L (ref 134–144)
Total Protein: 7.2 g/dL (ref 6.0–8.5)
eGFR: 107 mL/min/1.73 (ref >59)

## 2024-01-11 LAB — CBC
Hematocrit: 45.9 % (ref 37.5–51.0)
Hemoglobin: 15.3 g/dL (ref 13.0–17.7)
MCH: 27.8 pg (ref 26.6–33.0)
MCHC: 33.3 g/dL (ref 31.5–35.7)
MCV: 83 fL (ref 79–97)
Platelets: 263 x10E3/uL (ref 150–450)
RBC: 5.51 x10E6/uL (ref 4.14–5.80)
RDW: 13.3 % (ref 11.6–15.4)
WBC: 8.8 x10E3/uL (ref 3.4–10.8)

## 2024-01-14 ENCOUNTER — Ambulatory Visit: Payer: Self-pay | Admitting: Physician Assistant

## 2024-01-14 ENCOUNTER — Encounter: Payer: Self-pay | Admitting: Radiology

## 2024-01-17 ENCOUNTER — Encounter (HOSPITAL_COMMUNITY): Payer: Self-pay

## 2024-01-17 ENCOUNTER — Ambulatory Visit (HOSPITAL_COMMUNITY): Admitting: Licensed Clinical Social Worker

## 2024-01-17 LAB — BASIC METABOLIC PANEL WITH GFR
BUN/Creatinine Ratio: 10 (ref 9–20)
BUN: 9 mg/dL (ref 6–24)
CO2: 23 mmol/L (ref 20–29)
Calcium: 9.5 mg/dL (ref 8.7–10.2)
Chloride: 98 mmol/L (ref 96–106)
Creatinine, Ser: 0.88 mg/dL (ref 0.76–1.27)
Glucose: 103 mg/dL — ABNORMAL HIGH (ref 70–99)
Potassium: 3.9 mmol/L (ref 3.5–5.2)
Sodium: 137 mmol/L (ref 134–144)
eGFR: 107 mL/min/1.73 (ref >59)

## 2024-01-22 ENCOUNTER — Other Ambulatory Visit: Payer: Self-pay | Admitting: Family Medicine

## 2024-01-22 ENCOUNTER — Other Ambulatory Visit: Payer: Self-pay | Admitting: Physician Assistant

## 2024-01-22 DIAGNOSIS — E1165 Type 2 diabetes mellitus with hyperglycemia: Secondary | ICD-10-CM

## 2024-01-23 ENCOUNTER — Telehealth (HOSPITAL_COMMUNITY): Payer: Self-pay | Admitting: *Deleted

## 2024-01-23 ENCOUNTER — Other Ambulatory Visit: Payer: Self-pay

## 2024-01-23 ENCOUNTER — Other Ambulatory Visit (HOSPITAL_COMMUNITY): Payer: Self-pay

## 2024-01-23 MED ORDER — OZEMPIC (0.25 OR 0.5 MG/DOSE) 2 MG/3ML ~~LOC~~ SOPN
PEN_INJECTOR | SUBCUTANEOUS | 1 refills | Status: DC
  Filled 2024-01-23: qty 3, 28d supply, fill #0

## 2024-01-23 NOTE — Telephone Encounter (Signed)
 Reaching out to patient to offer assistance regarding upcoming cardiac imaging study; pt verbalizes understanding of appt date/time, parking situation and where to check in, pre-test NPO status and medications ordered, and verified current allergies; name and call back number provided for further questions should they arise Jordan Seats RN Navigator Cardiac Imaging [facility] [phone] office [phone] cell

## 2024-01-24 ENCOUNTER — Encounter: Payer: Self-pay | Admitting: Endocrinology

## 2024-01-24 ENCOUNTER — Ambulatory Visit: Payer: Self-pay | Admitting: Endocrinology

## 2024-01-24 ENCOUNTER — Ambulatory Visit (HOSPITAL_COMMUNITY)
Admission: RE | Admit: 2024-01-24 | Discharge: 2024-01-24 | Disposition: A | Discharge status: Home / Self Care | Source: Ambulatory Visit | Attending: Cardiology | Admitting: Cardiology

## 2024-01-24 ENCOUNTER — Other Ambulatory Visit: Payer: Self-pay

## 2024-01-24 ENCOUNTER — Other Ambulatory Visit (HOSPITAL_COMMUNITY): Payer: Self-pay

## 2024-01-24 ENCOUNTER — Ambulatory Visit (INDEPENDENT_AMBULATORY_CARE_PROVIDER_SITE_OTHER): Admitting: Endocrinology

## 2024-01-24 VITALS — BP 138/80 | HR 83 | Resp 20 | Ht 70.0 in | Wt 266.8 lb

## 2024-01-24 DIAGNOSIS — I10 Essential (primary) hypertension: Secondary | ICD-10-CM | POA: Diagnosis present

## 2024-01-24 DIAGNOSIS — R002 Palpitations: Secondary | ICD-10-CM | POA: Insufficient documentation

## 2024-01-24 DIAGNOSIS — I2510 Atherosclerotic heart disease of native coronary artery without angina pectoris: Secondary | ICD-10-CM | POA: Diagnosis present

## 2024-01-24 DIAGNOSIS — I5022 Chronic systolic (congestive) heart failure: Secondary | ICD-10-CM | POA: Diagnosis present

## 2024-01-24 DIAGNOSIS — R079 Chest pain, unspecified: Secondary | ICD-10-CM | POA: Diagnosis present

## 2024-01-24 DIAGNOSIS — Z7984 Long term (current) use of oral hypoglycemic drugs: Secondary | ICD-10-CM

## 2024-01-24 DIAGNOSIS — I428 Other cardiomyopathies: Secondary | ICD-10-CM | POA: Insufficient documentation

## 2024-01-24 DIAGNOSIS — E118 Type 2 diabetes mellitus with unspecified complications: Secondary | ICD-10-CM

## 2024-01-24 DIAGNOSIS — E785 Hyperlipidemia, unspecified: Secondary | ICD-10-CM | POA: Insufficient documentation

## 2024-01-24 DIAGNOSIS — Z7985 Long-term (current) use of injectable non-insulin antidiabetic drugs: Secondary | ICD-10-CM

## 2024-01-24 DIAGNOSIS — G4733 Obstructive sleep apnea (adult) (pediatric): Secondary | ICD-10-CM | POA: Diagnosis present

## 2024-01-24 LAB — POCT GLYCOSYLATED HEMOGLOBIN (HGB A1C): Hemoglobin A1C: 5.9 % — AB (ref 4.0–5.6)

## 2024-01-24 MED ORDER — SEMAGLUTIDE (1 MG/DOSE) 4 MG/3ML ~~LOC~~ SOPN
1.0000 mg | PEN_INJECTOR | SUBCUTANEOUS | 3 refills | Status: AC
  Filled 2024-01-24 – 2024-01-31 (×2): qty 3, 28d supply, fill #0
  Filled 2024-03-04: qty 3, 28d supply, fill #1
  Filled 2024-04-07: qty 3, 28d supply, fill #2

## 2024-01-24 MED ORDER — IOHEXOL 350 MG/ML SOLN
100.0000 mL | Freq: Once | INTRAVENOUS | Status: AC | PRN
  Administered 2024-01-24: 100 mL via INTRAVENOUS

## 2024-01-24 MED ORDER — METOPROLOL TARTRATE 5 MG/5ML IV SOLN
10.0000 mg | Freq: Once | INTRAVENOUS | Status: AC | PRN
  Administered 2024-01-24: 10 mg via INTRAVENOUS

## 2024-01-24 MED ORDER — DILTIAZEM HCL 25 MG/5ML IV SOLN
10.0000 mg | INTRAVENOUS | Status: DC | PRN
  Administered 2024-01-24: 10 mg via INTRAVENOUS

## 2024-01-24 MED ORDER — NITROGLYCERIN 0.4 MG SL SUBL
0.8000 mg | SUBLINGUAL_TABLET | Freq: Once | SUBLINGUAL | Status: AC
  Administered 2024-01-24: 0.8 mg via SUBLINGUAL

## 2024-01-24 NOTE — Progress Notes (Signed)
 Outpatient Endocrinology Note Dontray Haberland, MD   Patient's Name: Jordan Sparks    DOB: 11-Dec-1975    MRN: [id number]                                                    REASON OF VISIT: New consult  for type 2 diabetes mellitus  REFERRING PROVIDER: de Cuba, Quintin PARAS, MD   PCP: de Cuba, Quintin PARAS, MD  HISTORY OF PRESENT ILLNESS:   Jordan Sparks is a 48 y.o. old male with past medical history listed below, is here for new consult for type 2 diabetes mellitus.   Pertinent Diabetes History: Patient is referred to endocrinology for evaluation and management of type 2 diabetes mellitus, initial consult/visit on January 24, 2024.  Patient was diagnosed with type 2 diabetes mellitus in 2019.  Patient had uncontrolled type 2 diabetes mellitus, has been lately improving.  Hemoglobin A1c improved from 8.5 to 6.3% in August 2025.  He has also been following with pharmacy and medications have been adjusted.  History of DKA or diabetes related hospitalizations: none  Previous diabetes education: No   Family h/o diabetes mellitus: grand mother with type 2 diabetes mellitus.    No personal history of pancreatitis and / or family history of medullary thyroid carcinoma or MEN 2B syndrome.   Chronic Diabetes Complications : Retinopathy: no. Last ophthalmology exam was done on 09/2022, following with ophthalmology regularly.  Nephropathy: Microalbuminuria, on ACE/ARB /valsartan  and Farxiga /SGLT2 inhibitor. Peripheral neuropathy: no Coronary artery disease: no Stroke: on  Relevant comorbidities and cardiovascular risk factors: Obesity: yes Body mass index is 38.28 kg/m.  Hypertension: Yes  Hyperlipidemia : Yes, on statin   Current / Home Diabetic regimen includes:  Xigduo  extended release 07-998 mg 2 times a day. Ozempic  0.5 mg weekly.   Prior diabetic medications: Used to be on glipizide .  Glycemic data:   Glucometer Accu-Chek guide me downloaded from October  30 to January 24, 2024, average blood sugar 123, he has been checking blood sugar occasionally at different times of the day some of the blood sugar 130, 122, 112, 126.   Hypoglycemia: Patient has no hypoglycemic episodes. Patient has hypoglycemia awareness.  Factors modifying glucose control: 1.  Diabetic diet assessment: 3 meals a day, cut down on fries.  Still drinking sodas.  2.  Staying active or exercising: no  3.  Medication compliance: compliant all of the time.   Interval history  Initial visit today.  Patient presented for evaluation and management of type 2 diabetes mellitus.  He has improvement on diabetes control over time, hemoglobin A1c today improved to 5.9%, congratulated him.  Glucometer data as reviewed above.  Diabetes regimen as reviewed and noted above.  REVIEW OF SYSTEMS As per history of present illness.   PAST MEDICAL HISTORY: Past Medical History:  Diagnosis Date   Anxiety    Asthma    CHF (congestive heart failure) (HCC)    Coronary artery disease    Depression    Diabetes mellitus type 2 in obese    Hypertension    Manic disorder (HCC)    Obesity    Sleep apnea     PAST SURGICAL HISTORY: Past Surgical History:  Procedure Laterality Date   CARPAL TUNNEL RELEASE Right 12/19/2021   Procedure: RIGHT CARPAL TUNNEL RELEASE;  Surgeon: Barbarann Anes  C, MD;  Location: [facility];  Service: Orthopedics;  Laterality: Right;   Dental procedure      ALLERGIES: Allergies  Allergen Reactions   Lisinopril Cough    FAMILY HISTORY:  Family History  Problem Relation Age of Onset   Seizures Brother    Heart failure Neg Hx     SOCIAL HISTORY: Social History   Socioeconomic History   Marital status: Married    Spouse name: [name]   Number of children: 2   Years of education: Not on file   Highest education level: Associate degree: occupational, scientist, product/process development, or vocational program  Occupational History   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, naval architect  Tobacco Use   Smoking status: Every Day    Current packs/day: 1.00    Average packs/day: 0.3 packs/day for 32.9 years (8.9 ttl pk-yrs)    Types: Cigarettes, Cigars    Start date: 04/1989    Last attempt to quit: 04/2021    Passive exposure: Past   Smokeless tobacco: Never  Vaping Use   Vaping status: Never Used  Substance and Sexual Activity   Alcohol use: Not Currently    Comment: rarely   Drug use: Yes    Types: Marijuana    Comment: 1 blunt daily   Sexual activity: Yes    Partners: Female    Birth control/protection: None  Other Topics Concern   Not on file  Social History Narrative   Not on file   Social Drivers of Health   Financial Resource Strain: Medium Risk (10/26/2023)   Overall Financial Resource Strain (CARDIA)    Difficulty of Paying Living Expenses: Somewhat hard  Food Insecurity: Food Insecurity Present (10/26/2023)   Hunger Vital Sign    Worried About Running Out of Food in the Last Year: Often true    Ran Out of Food in the Last Year: Sometimes true  Transportation Needs: Unmet Transportation Needs (10/26/2023)   PRAPARE - Transportation    Lack of Transportation (Medical): No    Lack of Transportation (Non-Medical): Yes  Physical Activity: Inactive (10/26/2023)   Exercise Vital Sign    Days of Exercise per Week: 0 days    Minutes of Exercise per Session: Not on file  Stress: Stress Concern Present (10/26/2023)   [employer] of Occupational Health - Occupational Stress Questionnaire    Feeling of Stress: To some extent  Social Connections: Moderately Isolated (10/26/2023)   Social Connection and Isolation Panel    Frequency of Communication with Friends and Family: Twice a week    Frequency of Social Gatherings with Friends and Family: Once a week    Attends Religious Services: Never    Database Administrator or Organizations: No    Attends Engineer, Structural: Not on file    Marital Status:  Married    MEDICATIONS:  Current Outpatient Medications  Medication Sig Dispense Refill   Accu-Chek Softclix Lancets lancets Use to check blood sugars daily. Dx code: E11.9 100 each 3   albuterol  (VENTOLIN  HFA) 108 (90 Base) MCG/ACT inhaler Inhale 2 puffs into the lungs every 4 (four) hours as needed for wheezing or shortness of breath. 18 g 0   amLODipine  (NORVASC ) 10 MG tablet Take 1 tablet by mouth daily 90 tablet 2   ARIPiprazole  (ABILIFY ) 5 MG tablet Take 1 tablet (5 mg total) by mouth daily. 90 tablet 0   blood glucose meter kit and supplies KIT Use up to four  times daily as directed. 1 each 0   Blood Glucose Monitoring Suppl (ACCU-CHEK GUIDE ME) w/Device KIT Use to check blood sugars daily. Dx code: E11.9 1 kit 0   buPROPion  (WELLBUTRIN  XL) 150 MG 24 hr tablet Take 1 tablet (150 mg total) by mouth every morning. 90 tablet 0   carvedilol  (COREG ) 25 MG tablet Take 1 tablet (25 mg total) by mouth 2 (two) times daily. 60 tablet 4   cetirizine  (ZYRTEC ) 10 MG tablet Take 1 tablet (10 mg total) by mouth daily. 100 tablet 0   Dapagliflozin  Pro-metFORMIN  ER (XIGDUO  XR) 07-998 MG TB24 Take 2 tablets by mouth daily. 60 tablet 5   divalproex  (DEPAKOTE  ER) 500 MG 24 hr tablet Take 1 tablet (500 mg total) by mouth at bedtime. 90 tablet 0   DULoxetine  (CYMBALTA ) 60 MG capsule Take 1 capsule (60 mg total) by mouth 2 (two) times daily. 180 capsule 0   eplerenone  (INSPRA ) 25 MG tablet Take 1 tablet (25 mg total) by mouth daily. 90 tablet 3   fluticasone  (FLONASE ) 50 MCG/ACT nasal spray Place 2 sprays into both nostrils daily as needed for allergies. 16 g 1   glucose blood (ACCU-CHEK GUIDE TEST) test strip Use to check blood sugar daily. Dx code: E11.9 100 each 3   hydrALAZINE  (APRESOLINE ) 50 MG tablet Take 1 tablet (50 mg total) by mouth in the morning and at bedtime. 180 tablet 3   hydrochlorothiazide (MICROZIDE) 12.5 MG capsule Take 1 capsule (12.5 mg total) by mouth daily. 90 capsule 3   hydrOXYzine   (ATARAX ) 25 MG tablet Take 0.5-1 tablets (12.5-25 mg total) by mouth 2 (two) times daily as needed (panic attacks). 60 tablet 1   metoprolol  tartrate (LOPRESSOR ) 100 MG tablet Take 2 hours prior to test. 1 tablet 0   NON FORMULARY Pt uses a cpap nightly     rosuvastatin  (CRESTOR ) 20 MG tablet Take 1 tablet (20 mg total) by mouth daily. 90 tablet 3   sacubitril -valsartan  (ENTRESTO ) 97-103 MG Take 1 tablet by mouth 2 (two) times daily. 180 tablet 0   Semaglutide , 1 MG/DOSE, 4 MG/3ML SOPN Inject 1 mg as directed once a week. 9 mL 3   No current facility-administered medications for this visit.   Facility-Administered Medications Ordered in Other Visits  Medication Dose Route Frequency Provider Last Rate Last Admin   diltiazem (CARDIZEM) injection 10 mg  10 mg Intravenous Q5 min PRN Acharya, Gayatri A, MD   10 mg at 01/24/24 0849    PHYSICAL EXAM: Vitals:   01/24/24 1500  BP: 138/80  Pulse: 83  Resp: 20  SpO2: 94%  Weight: 266 lb 12.8 oz (121 kg)  Height: 5' 10 (1.778 m)   Body mass index is 38.28 kg/m.  Wt Readings from Last 3 Encounters:  01/24/24 266 lb 12.8 oz (121 kg)  01/10/24 262 lb (118.8 kg)  12/20/23 261 lb 3.2 oz (118.5 kg)    General: Well developed, well nourished male in no apparent distress.  HEENT: AT/[place], no external lesions.  Eyes: Conjunctiva clear and no icterus. Neck: Neck supple  Lungs: Respirations not labored Neurologic: Alert, oriented, normal speech Extremities / Skin: Dry. + acanthosis nigricans Psychiatric: Does not appear depressed or anxious  Diabetic Foot Exam - Simple   Simple Foot Form Visual Inspection No deformities, no ulcerations, no other skin breakdown bilaterally: Yes Sensation Testing Intact to touch and monofilament testing bilaterally: Yes Pulse Check Posterior Tibialis and Dorsalis pulse intact bilaterally: Yes Comments B/ callus +  Dystrophic nails bilaterally.      LABS Reviewed Lab Results  Component Value Date    HGBA1C 5.9 (A) 01/24/2024   HGBA1C 6.3 (H) 10/26/2023   HGBA1C 8.5 (H) 06/21/2023   No results found for: FRUCTOSAMINE Lab Results  Component Value Date   CHOL 121 01/10/2024   HDL 44 01/10/2024   LDLCALC 60 01/10/2024   TRIG 85 01/10/2024   CHOLHDL 2.8 01/10/2024   Lab Results  Component Value Date   MICRALBCREAT 217 (H) 02/05/2023   Lab Results  Component Value Date   CREATININE 0.88 01/17/2024   No results found for: GFR  ASSESSMENT / PLAN  1. Controlled diabetes mellitus type 2 with complications (HCC)     Diabetes Mellitus type 2, complicated by microalbuminuria. - Diabetic status / severity: Controlled, improving.  Lab Results  Component Value Date   HGBA1C 5.9 (A) 01/24/2024    - Hemoglobin A1c goal : <6.5%  Patient had uncontrolled type 2 diabetes mellitus, lately has been significantly improving.  He reports he has made improvement on diet and also been compliant with diabetic medications.  Discussed about type 2 diabetes mellitus and potential chronic diabetic complications including diabetic retinopathy, neuropathy and nephropathy.  - Medications: See below.  I) continue with Xigduo  extended release 07-998 mg 2 times a day. II) increase Ozempic  from 0.5 to 1 mg weekly.  Prescription sent.  He will benefit from losing body weight.  - Home glucose testing: Check blood sugar at least few times a week at different times of the day. - Discussed/ Gave Hypoglycemia treatment plan.  # Consult : not required at this time.   # Annual urine for microalbuminuria/ creatinine ratio, + microalbuminuria currently, continue ACE/ARB valsartan  and SGLT2 inhibitor Xigduo .   Last  Lab Results  Component Value Date   MICRALBCREAT 217 (H) 02/05/2023    # Foot check nightly.  Has dystrophic nails, refer to podiatry.  # Annual dilated diabetic eye exams.  Advised to have diabetic eye exam.  - Diet: Make healthy diabetic food choices, discussed in detail.   Advised patient to stop drinking regular sodas. - Life style / activity / exercise: Discussed.  2. Blood pressure  -  BP Readings from Last 1 Encounters:  01/24/24 138/80    - Control is in target.  - No change in current plans.  3. Lipid status / Hyperlipidemia - Last  Lab Results  Component Value Date   LDLCALC 60 01/10/2024   - Continue rosuvastatin  20 mg daily.  Managed by PCP.  Diagnoses and all orders for this visit:  Controlled diabetes mellitus type 2 with complications (HCC) -     POCT glycosylated hemoglobin (Hb A1C) -     Ambulatory referral to Podiatry -     Semaglutide , 1 MG/DOSE, 4 MG/3ML SOPN; Inject 1 mg as directed once a week. -     Microalbumin / creatinine urine ratio    DISPOSITION Follow up in clinic in 4  months suggested.   All questions answered and patient verbalized understanding of the plan.  Adal Sereno, MD [facility] Endocrinology [facility] [address] [place], [address] [place], [place] [address] Phone # [phone]  At least part of this note was generated using voice recognition software. Inadvertent word errors may have occurred, which were not recognized during the proofreading process.

## 2024-01-25 LAB — MICROALBUMIN / CREATININE URINE RATIO
Creatinine, Urine: 136 mg/dL (ref 20–320)
Microalb Creat Ratio: 28 mg/g{creat} (ref <30)
Microalb, Ur: 3.8 mg/dL

## 2024-01-25 NOTE — Progress Notes (Signed)
**Note Jordan-Identified via Obfuscation**  Cardiology Office Note   Date:  01/28/2024  ID:  Jordan Sparks, DOB [birth year], MRN [id number] PCP: Jordan Cuba, Raymond J, MD  [facility] Providers Cardiologist:  Soyla DELENA Merck, MD   History of Present Illness Jordan Sparks is a 48 y.o. male ith a history of mild non-obstructive CAD noted on coronary CTA in 05/2021, non-ischemic cardiomyopathy/ chronic systolic CHF with EF as low as 25-30% in 05/2021 but normalized to 55% on Echo in 08/2021,  uncontrolled hypertension, hyperlipidemia, type 2 diabetes mellitus, obstructive sleep apnea on CPAP, congenital spinal stenosis, anxiety/depression, mood disorder, obesity, polysubstance abuse (including prior cocaine use), and medication non-compliance who is followed by Dr. Merck and presents today for follow up of CHF and hypertension.   Patient was admitted in 05/2021 after presenting with chest pain and shortness of breath. High-sensitivity troponin was minimally elevated and flat in the 20s. Echo showed LVEF of 25-30% with global hypokinesis and moderate LVH as well as normal RV and mild MR. Coronary CTA showed a coronary calcium  score of 24 with only minimal soft plaque in the mid LAD. Cardiomyopathy felt to be due to longstanding uncontrolled BP. Renal artery ultrasound showed 1-59% stenosis of bilateral renal arteries. He was started on GDMT. Unfortunately, medication compliance has been an issue. However, repeat Echo in 08/2021 showed improvement of LVEF to 55% with moderate LVH, grade 2 diastolic dysfunction, and only trivial MR.    He was last seen by me in 11/2021 at which time he was doing well from a cardiac standpoint. He described one episode of chest pain in setting of significant anxiety as well as some palpitations in setting of panic attacks but was otherwise doing well.   He was seen at an Urgent Care in 11/2022 for right sided neck pain and intermittent left sided chest pain. Pain was felt to be musculoskeletal  in nature after lifting a heavy box. He was given a one-time dose steroid dose and Flexeril .    He was recently seen by his PCP on 03/01/2023 at which time BP was elevated at 160/101. However, he had not been taking his medications regularly largely due to financial difficulties. He continue on Amlodipine  10mg  and Entresto  97-103mg  twice daily and encouraged to take as prescribed.   Patient presents today for follow-up. He states he is doing well from a cardiac standpoint other than his BP being uncontrolled.  He had 1 isolated episode of chest pain that lasted a couple hours last month but he thinks this was related to something he ate.  It improved with belching.  He denies any other episodes of chest pain.  No exertional chest pain.  No shortness of breath, orthopnea, PND.  He notes occasional lower extremity swelling mostly around his ankles but nothing significant.  No palpitations, lightheadedness, dizziness, syncope.  He had a high BP and was taking his medication as prescribed.   I saw him 10/30, he has a hx of hypertension and coronary artery disease with uncontrolled blood pressure.  He is on amlodipine  10 mg daily, carvedilol  25 mg twice daily, Entresto  97/103 mg twice daily, and hydralazine  25 mg twice daily. Despite this, his blood pressure remains uncontrolled. He struggles with the three times daily dosing of hydralazine , taking it only twice daily. He has reduced salt intake and is mindful of his diet due to diabetes, yet his blood pressure remains elevated. 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  He experiences nighttime pain  described as a 'knot' that resolves upon getting up. This pain does not occur during the day or with activity. A CT scan of his coronary arteries in 2023 showed no significant issues. He has not had recent lab work and missed an appointment for it.  He is preparing for neck surgery and has not had any stents placed. 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 He has a family history of hypertension and is on Ozempic  for diabetes management, being cautious about his diet to prevent blood sugar spikes.   Reports no shortness of breath nor dyspnea on exertion. Reports no chest pain, pressure, or tightness. No edema, orthopnea, PND. Reports no palpitations.   Patient agreed to [facility] recording.   Today, he  presents with a hx of coronary artery disease for follow-up after a CT coronary test.  The CT coronary test shows mild coronary artery disease with less than 25% stenosis in one portion of the LAD and 25-49% stenosis in another portion. The right coronary artery also has 25-49% stenosis. His calcium  score is 33, placing him in the 92nd percentile for his age and sex.  He is on Crestor , Entresto , amlodipine  10 mg, carvedilol  25 mg twice a day, and hydralazine  50 mg in the morning and at bedtime. He recently increased his hydralazine  dose from 25 mg to 50 mg twice a day but finds it difficult to take a midday dose. He also was recently started on hydrochlorothiazide 12.5mg  daily. His recent lipid panel shows an LDL of 60 mg/dL.  His blood pressure has been high at home, though he has not checked it recently. He plans to obtain a blood pressure cuff this week. There is no recent swelling in his feet or ankles, but slight swelling is noted at the sock line.  He experienced a recent episode of waking with a pain described as a 'little knot' on his sternum, reproducible with pressure. There are no recent palpitations or episodes of heart fluttering or rapid beating.  Reports no shortness of breath nor dyspnea on exertion. Reports no chest pain, pressure, or tightness. No edema, orthopnea, PND. Reports no palpitations.   Discussed the use of AI scribe software for clinical note transcription with the patient, who gave verbal consent to proceed.   ROS: pertinent ROS in HPI  Studies Reviewed       Echocardiogram 05/31/2021: Impressions:  1. Left ventricular ejection fraction, by estimation, is 25 to 30%. The  left ventricle has severely decreased function. The left ventricle  demonstrates global hypokinesis. There is moderate concentric left  ventricular hypertrophy. Indeterminate  diastolic filling due to E-A fusion.   2. Right ventricular systolic function is normal. The right ventricular  size is normal. Tricuspid regurgitation signal is inadequate for assessing  PA pressure.   3. The mitral valve is grossly normal. Mild mitral valve regurgitation.  No evidence of mitral stenosis.   4. The aortic valve is tricuspid. Aortic valve regurgitation is not  visualized. No aortic stenosis is present.   5. The inferior vena cava is dilated in size with <50% respiratory  variability, suggesting right atrial pressure of 15 mmHg.  _______________   Renal Artery Ultrasound 06/01/2021: Summary:  Renal:  - Right: 1-59% stenosis of the right renal artery. Normal right resistive Index. Normal size right kidney.  - Left:  1-59% stenosis of the left renal artery. Normal left resistive Index. Normal size of left kidney.    Mesenteric:  Areas of limited visceral study include mesenteric arteries and left renal  artery.  _______________   Coronary CTA 06/01/2021: Impressions: 1. Coronary calcium  score of 24. Elevated based on age, race, and gender. 2. Normal coronary origin with left dominance. 3. CAD-RADS 1. Minimal non-obstructive CAD (1-24%). Consider non-atherosclerotic causes of chest pain. Consider preventive therapy and risk factor modification. _______________   Echocardiogram 09/09/2021: Impressions:  1. Left ventricular ejection fraction, by estimation, is 55%. The left  ventricle has normal function. The left ventricle has no regional wall  motion abnormalities. There is moderate concentric left ventricular  hypertrophy. Left ventricular diastolic  parameters are  consistent with Grade II diastolic dysfunction  (pseudonormalization).   2. Right ventricular systolic function is normal. The right ventricular  size is normal.   3. Left atrial size was mild to moderately dilated.   4. The mitral valve is normal in structure. Trivial mitral valve  regurgitation. No evidence of mitral stenosis.   5. The aortic valve is normal in structure. Aortic valve regurgitation is  not visualized. No aortic stenosis is present.   6. Aortic dilatation noted. There is borderline dilatation of the  ascending aorta, measuring 38 mm.   7. The inferior vena cava is normal in size with greater than 50%  respiratory variability, suggesting right atrial pressure of 3 mmHg.    Physical Exam VS:  BP (!) 150/90 (BP Location: Left Arm, Patient Position: Sitting, Cuff Size: Large)   Ht 5' 10.5 (1.791 m)   Wt 263 lb 6.4 oz (119.5 kg)   BMI 37.26 kg/m        Wt Readings from Last 3 Encounters:  01/28/24 263 lb 6.4 oz (119.5 kg)  01/24/24 266 lb 12.8 oz (121 kg)  01/10/24 262 lb (118.8 kg)    GEN: Well nourished, well developed in no acute distress NECK: No JVD; No carotid bruits CARDIAC: RRR, no murmurs, rubs, gallops RESPIRATORY:  Clear to auscultation without rales, wheezing or rhonchi  ABDOMEN: Soft, non-tender, non-distended EXTREMITIES:  No edema; No deformity   ASSESSMENT AND PLAN  Coronary artery disease with elevated coronary calcium  score and mild multivessel involvement Mild coronary artery disease with non-obstructive stenosis. High calcium  score indicates elevated risk for future events. No immediate need for cardiac catheterization. - Increased Crestor  to 40 mg at night. - Monitor blood pressure and adjust medications as needed. - Ordered lipid panel in a couple of months.  Essential hypertension Blood pressure slightly elevated despite current regimen. Recent increase in hydralazine  to 50 mg twice daily. - Monitor blood pressure at home and report  via MyChart. - Consider increasing hydralazine  to 75 mg twice daily if blood pressure remains elevated by end of November. - Ensure blood pressure cuff is obtained for home monitoring. - Current blood pressure medications include amlodipine  10 mg daily, carvedilol  25 mg twice daily, hydralazine  50 mg twice daily, Entresto  97/103 mg twice daily, and HCTZ 12.5 mg daily - He has an appointment at the end of the month with his primary care doctor and his blood pressure will be checked then as well  Mixed hyperlipidemia LDL at 60 mg/dL, close to target. Current treatment effective but could be optimized. - Increased Crestor  to 40 mg at night. - Reassess lipid panel in a couple of months.  Chest wall pain Intermittent pain reproducible with pressure, suggesting non-cardiac origin. - Continue to monitor for changes in symptoms.  Type 2 diabetes mellitus Managed with dietary modifications and Ozempic .  Clearance for neck surgery -  Previously assessed and determined that a CT of his coronary arteries would be needed.  Since his coronary CT was stable with only mild coronary artery disease, he is cleared for surgery at acceptable risk.  No further testing is needed. He can hold his aspirin  for 5 to 7 days prior to surgery and resume when medically safe to do so     Dispo: Please return in 5-6 months with MD  Signed, Orren LOISE Fabry, PA-C

## 2024-01-28 ENCOUNTER — Other Ambulatory Visit (HOSPITAL_COMMUNITY): Payer: Self-pay

## 2024-01-28 ENCOUNTER — Encounter: Payer: Self-pay | Admitting: Physician Assistant

## 2024-01-28 ENCOUNTER — Ambulatory Visit: Attending: Physician Assistant | Admitting: Physician Assistant

## 2024-01-28 VITALS — BP 150/90 | Ht 70.5 in | Wt 263.4 lb

## 2024-01-28 DIAGNOSIS — R079 Chest pain, unspecified: Secondary | ICD-10-CM | POA: Diagnosis not present

## 2024-01-28 DIAGNOSIS — I428 Other cardiomyopathies: Secondary | ICD-10-CM | POA: Diagnosis present

## 2024-01-28 DIAGNOSIS — G4733 Obstructive sleep apnea (adult) (pediatric): Secondary | ICD-10-CM | POA: Diagnosis present

## 2024-01-28 DIAGNOSIS — I10 Essential (primary) hypertension: Secondary | ICD-10-CM | POA: Diagnosis not present

## 2024-01-28 DIAGNOSIS — E782 Mixed hyperlipidemia: Secondary | ICD-10-CM | POA: Insufficient documentation

## 2024-01-28 DIAGNOSIS — I2510 Atherosclerotic heart disease of native coronary artery without angina pectoris: Secondary | ICD-10-CM | POA: Insufficient documentation

## 2024-01-28 DIAGNOSIS — E118 Type 2 diabetes mellitus with unspecified complications: Secondary | ICD-10-CM | POA: Insufficient documentation

## 2024-01-28 MED ORDER — ROSUVASTATIN CALCIUM 40 MG PO TABS
40.0000 mg | ORAL_TABLET | Freq: Every day | ORAL | 1 refills | Status: DC
  Filled 2024-01-28: qty 90, 90d supply, fill #0

## 2024-01-28 NOTE — Patient Instructions (Signed)
 Medication Instructions:  INCREASE Crestor  to 40mg  take 1 tablet daily in the evenings *If you need a refill on your cardiac medications before your next appointment, please call your pharmacy*  Lab Work: None ordered If you have labs (blood work) drawn today and your tests are completely normal, you will receive your results only by: MyChart Message (if you have MyChart) OR A paper copy in the mail If you have any lab test that is abnormal or we need to change your treatment, we will call you to review the results.  Testing/Procedures: None ordered  Follow-Up: At [facility], you and your health needs are our priority.  As part of our continuing mission to provide you with exceptional heart care, our providers are all part of one team.  This team includes your primary Cardiologist (physician) and Advanced Practice Providers or APPs (Physician Assistants and Nurse Practitioners) who all work together to provide you with the care you need, when you need it.  Your next appointment:   5-6 month(s)  Provider:   Soyla DELENA Merck, MD    We recommend signing up for the patient portal called MyChart.  Sign up information is provided on this After Visit Summary.  MyChart is used to connect with patients for Virtual Visits (Telemedicine).  Patients are able to view lab/test results, encounter notes, upcoming appointments, etc.  Non-urgent messages can be sent to your provider as well.   To learn more about what you can do with MyChart, go to [email or web address].   Other Instructions Please check your blood pressure daily for two weeks, then contact the office with your readings  Please contact the office with your readings either by phone, by dropping it off in person, or by sending it through MyChart.   Be sure to check your blood pressure one to two hours after taking your medications.  Avoid the following for 30 minutes before checking your blood pressure: No caffeine No  alcohol No eating No smoking  No exercise  Five minutes before checking your blood pressure: Use the restroom Sit up straight in a chair with your back supported and feet flat on the floor Remain quiet and do not talk

## 2024-01-29 NOTE — Telephone Encounter (Signed)
 Spoke with pt regarding test results. Pt stated he discuss results with Tessa yesterday at his f/u appt. Pt had no further questions at this time.

## 2024-01-30 NOTE — Progress Notes (Unsigned)
 Patient did not connect for virtual psychiatric medication management appointment on 01/31/24 at 1:30PM. Sent secure video link with no response. Called phone with no answer; left VM with callback number to reschedule.  LAURAINE DELENA PUMMEL, MD 01/31/24

## 2024-01-31 ENCOUNTER — Encounter (HOSPITAL_COMMUNITY): Payer: Self-pay

## 2024-01-31 ENCOUNTER — Other Ambulatory Visit (HOSPITAL_BASED_OUTPATIENT_CLINIC_OR_DEPARTMENT_OTHER): Payer: Self-pay | Admitting: Family Medicine

## 2024-01-31 ENCOUNTER — Other Ambulatory Visit (HOSPITAL_COMMUNITY): Payer: Self-pay

## 2024-01-31 ENCOUNTER — Encounter (HOSPITAL_COMMUNITY): Admitting: Psychiatry

## 2024-01-31 ENCOUNTER — Other Ambulatory Visit: Payer: Self-pay

## 2024-01-31 MED ORDER — FLUTICASONE PROPIONATE 50 MCG/ACT NA SUSP
2.0000 | Freq: Every day | NASAL | 1 refills | Status: AC | PRN
  Filled 2024-01-31: qty 16, 30d supply, fill #0

## 2024-02-04 ENCOUNTER — Ambulatory Visit: Admitting: Podiatry

## 2024-02-07 ENCOUNTER — Other Ambulatory Visit: Payer: Self-pay | Admitting: Physician Assistant

## 2024-02-08 ENCOUNTER — Other Ambulatory Visit (HOSPITAL_COMMUNITY): Payer: Self-pay

## 2024-02-12 ENCOUNTER — Other Ambulatory Visit: Payer: Self-pay

## 2024-02-25 ENCOUNTER — Ambulatory Visit (INDEPENDENT_AMBULATORY_CARE_PROVIDER_SITE_OTHER): Admitting: Family Medicine

## 2024-02-25 ENCOUNTER — Other Ambulatory Visit: Payer: Self-pay

## 2024-02-25 ENCOUNTER — Encounter (HOSPITAL_BASED_OUTPATIENT_CLINIC_OR_DEPARTMENT_OTHER): Payer: Self-pay | Admitting: Family Medicine

## 2024-02-25 VITALS — BP 137/88 | HR 109 | Temp 98.1°F | Resp 20 | Ht 70.5 in | Wt 257.0 lb

## 2024-02-25 DIAGNOSIS — E119 Type 2 diabetes mellitus without complications: Secondary | ICD-10-CM

## 2024-02-25 DIAGNOSIS — I10 Essential (primary) hypertension: Secondary | ICD-10-CM

## 2024-02-25 DIAGNOSIS — R7989 Other specified abnormal findings of blood chemistry: Secondary | ICD-10-CM

## 2024-02-25 DIAGNOSIS — N529 Male erectile dysfunction, unspecified: Secondary | ICD-10-CM | POA: Insufficient documentation

## 2024-02-25 DIAGNOSIS — Z0000 Encounter for general adult medical examination without abnormal findings: Secondary | ICD-10-CM

## 2024-02-25 MED ORDER — CETIRIZINE HCL 10 MG PO TABS
10.0000 mg | ORAL_TABLET | Freq: Every day | ORAL | 0 refills | Status: AC
  Filled 2024-02-25: qty 30, 30d supply, fill #0
  Filled 2024-03-22: qty 30, 30d supply, fill #1

## 2024-02-25 MED ORDER — TADALAFIL 10 MG PO TABS
10.0000 mg | ORAL_TABLET | ORAL | 1 refills | Status: AC | PRN
  Filled 2024-02-25: qty 10, 20d supply, fill #0
  Filled 2024-03-22: qty 10, 20d supply, fill #1

## 2024-02-25 MED ORDER — ROSUVASTATIN CALCIUM 40 MG PO TABS
40.0000 mg | ORAL_TABLET | Freq: Every day | ORAL | 1 refills | Status: AC
  Filled 2024-02-25: qty 90, 90d supply, fill #0

## 2024-02-25 NOTE — Progress Notes (Unsigned)
 Procedures performed today:    None.  Independent interpretation of notes and tests performed by another provider:   None.  Brief History, Exam, Impression, and Recommendations:    BP 137/88 (BP Location: Left Arm, Patient Position: Sitting, Cuff Size: Large)   Pulse (!) 109   Temp 98.1 F (36.7 C) (Oral)   Resp 20   Ht 5' 10.5 (1.791 m)   Wt 257 lb (116.6 kg)   SpO2 100%   BMI 36.35 kg/m   Type 2 diabetes mellitus without complication, without long-term current use of insulin  [facility]) Assessment & Plan: Patient has been working with pharmacist through [facility].  He continues with metformin  and Farxiga , however taking these as combination pill currently.  Denies any issues with medications.  He has stopped the glipizide , but was started on Ozempic .  Reports that he currently is doing well with medication.  Denies any significant GI side effects.  He does have follow-up arranged with pharmacist to assess progress with medication and consider dose titration if doing well.  He does check fasting blood sugars at home and reports that readings have been much better as compared to readings in the past. Foot exam completed today.  Urine ACR up-to-date  Orders: -     Hemoglobin A1c; Future  Primary hypertension Assessment & Plan: Blood pressure is borderline in office today, has improved slightly since last appointment.  Patient continues with amlodipine , Entresto , carvedilol , eplerenone , hydrochlorothiazide , hydralazine .  Denies any concerns today related to medication. Recommend intermittent monitoring of blood pressure at home, DASH diet.  Recommend continuing with arranged follow-up with cardiology.   Erectile dysfunction, unspecified erectile dysfunction type Assessment & Plan: Patient reports issues with erectile dysfunction.  He understands it could be related to chronic medical conditions as well as possibly related to medication side effects from medications that he  is taking presently.  We discussed general considerations, discussed importance of properly controlling blood pressure, blood sugars.  We discussed medication considerations.  He would like to proceed with trial of phosphodiesterase inhibitor.  We will start with low-dose of tadalafil  and monitor response.  Cautioned on potential side effects.   Wellness examination -     CBC with Differential/Platelet; Future -     Comprehensive metabolic panel with GFR; Future -     Hemoglobin A1c; Future -     Lipid panel; Future -     TSH + free T4; Future -     T3; Future  Low TSH level Assessment & Plan: Noted in the past, laboratory findings suggest subclinical hyperthyroidism.  Denies any current symptoms which suggest overt hyperthyroidism.  Most recent labs were normal.  We can continue with intermittent monitoring, plan to recheck thyroid function with next lab draw before next appointment.  Orders: -     TSH + free T4; Future -     T3; Future  Other orders -     Cetirizine  HCl; Take 1 tablet (10 mg total) by mouth daily.  Dispense: 100 tablet; Refill: 0 -     Rosuvastatin  Calcium ; Take 1 tablet (40 mg total) by mouth daily.  Dispense: 90 tablet; Refill: 1 -     Tadalafil ; Take 1 tablet (10 mg total) by mouth every other day as needed for erectile dysfunction.  Dispense: 10 tablet; Refill: 1  Return in about 4 months (around 06/25/2024) for CPE with fasting labs 1 week prior.   ___________________________________________ Ananda [place], MD, ABFM, [facility]  [facility]

## 2024-02-26 ENCOUNTER — Other Ambulatory Visit (HOSPITAL_COMMUNITY): Payer: Self-pay

## 2024-02-28 NOTE — Assessment & Plan Note (Signed)
 Blood pressure is borderline in office today, has improved slightly since last appointment.  Patient continues with amlodipine , Entresto , carvedilol , eplerenone , hydrochlorothiazide , hydralazine .  Denies any concerns today related to medication. Recommend intermittent monitoring of blood pressure at home, DASH diet.  Recommend continuing with arranged follow-up with cardiology.

## 2024-02-28 NOTE — Assessment & Plan Note (Signed)
 Patient reports issues with erectile dysfunction.  He understands it could be related to chronic medical conditions as well as possibly related to medication side effects from medications that he is taking presently.  We discussed general considerations, discussed importance of properly controlling blood pressure, blood sugars.  We discussed medication considerations.  He would like to proceed with trial of phosphodiesterase inhibitor.  We will start with low-dose of tadalafil  and monitor response.  Cautioned on potential side effects.

## 2024-02-28 NOTE — Assessment & Plan Note (Signed)
 Patient has been working with pharmacist through [facility].  He continues with metformin  and Farxiga , however taking these as combination pill currently.  Denies any issues with medications.  He has stopped the glipizide , but was started on Ozempic .  Reports that he currently is doing well with medication.  Denies any significant GI side effects.  He does have follow-up arranged with pharmacist to assess progress with medication and consider dose titration if doing well.  He does check fasting blood sugars at home and reports that readings have been much better as compared to readings in the past. Foot exam completed today.  Urine ACR up-to-date

## 2024-02-28 NOTE — Assessment & Plan Note (Signed)
 Noted in the past, laboratory findings suggest subclinical hyperthyroidism.  Denies any current symptoms which suggest overt hyperthyroidism.  Most recent labs were normal.  We can continue with intermittent monitoring, plan to recheck thyroid function with next lab draw before next appointment.

## 2024-03-04 ENCOUNTER — Other Ambulatory Visit: Payer: Self-pay

## 2024-03-04 ENCOUNTER — Other Ambulatory Visit: Payer: Self-pay | Admitting: Physician Assistant

## 2024-03-19 NOTE — Progress Notes (Signed)
 Surgical Instructions   Your procedure is scheduled on Tuesday March 25, 2024. Report to [facility] Main Entrance A at 5:30 A.M., then check in with the Admitting office. Any questions or running late day of surgery: call [phone]  Questions prior to your surgery date: call [phone], Monday-Friday, 8am-4pm. If you experience any cold or flu symptoms such as cough, fever, chills, shortness of breath, etc. between now and your scheduled surgery, please notify us  at the above number.     Remember:  Do not eat after midnight the night before your surgery  You may drink clear liquids until 4:30 the morning of your surgery.   Clear liquids allowed are: Water, Non-Citrus Juices (without pulp), Carbonated Beverages, Clear Tea (no milk, honey, etc.), Black Coffee Only (NO MILK, CREAM OR POWDERED CREAMER of any kind), and Gatorade.  Patient Instructions  The night before surgery:  No food after midnight. ONLY clear liquids after midnight  The day of surgery (if you have diabetes): Drink ONE (1) 12 oz G2 given to you in your pre admission testing appointment by 4:30 the morning of surgery. Drink in one sitting. Do not sip.  This drink was given to you during your hospital  pre-op appointment visit.  Nothing else to drink after completing the  12 oz bottle of G2.         If you have questions, please contact your surgeons office.   Take these medicines the morning of surgery with A SIP OF WATER  amLODipine  (NORVASC )  ARIPiprazole  (ABILIFY )  buPROPion  (WELLBUTRIN  XL)  carvedilol  (COREG )  cetirizine  (ZYRTEC )  DULoxetine  (CYMBALTA )  rosuvastatin  (CRESTOR )   May take these medicines IF NEEDED: albuterol  (VENTOLIN  HFA) 108 (90 Base) MCG/ACT inhaler  Please bring with you to the hospital fluticasone  (FLONASE )  hydrOXYzine  (ATARAX )   One week prior to surgery, STOP taking any Aspirin  (unless otherwise instructed by your surgeon) Aleve , Naproxen , Ibuprofen, Motrin, Advil,  Goody's, BC's, all herbal medications, fish oil, and non-prescription vitamins.    WHAT DO I DO ABOUT MY DIABETES MEDICATION?   Do not take oral diabetes medicines (pills) the morning of surgery.        DO NOT TAKE YOUR Dapagliflozin  Pro-metFORMIN  ER (XIGDUO  XR) 72 HOURS PRIOR TO SURGERY WITH THE LAST DOSE BEING 03/21/2024.            DO NOT TAKE YOUR Semaglutide  1 WEEK PRIOR TO SURGERY WITH THE LAST DOSE BEING NO LATER THAN 03/17/2024.     The day of surgery, do not take other diabetes injectables, including Byetta (exenatide), Bydureon (exenatide ER), Victoza (liraglutide), or Trulicity (dulaglutide).  If your CBG is greater than 220 mg/dL, you may take  of your sliding scale (correction) dose of insulin .   HOW TO MANAGE YOUR DIABETES BEFORE AND AFTER SURGERY  Why is it important to control my blood sugar before and after surgery? Improving blood sugar levels before and after surgery helps healing and can limit problems. A way of improving blood sugar control is eating a healthy diet by:  Eating less sugar and carbohydrates  Increasing activity/exercise  Talking with your doctor about reaching your blood sugar goals High blood sugars (greater than 180 mg/dL) can raise your risk of infections and slow your recovery, so you will need to focus on controlling your diabetes during the weeks before surgery. Make sure that the doctor who takes care of your diabetes knows about your planned surgery including the date and location.  How do I manage my blood  sugar before surgery? Check your blood sugar at least 4 times a day, starting 2 days before surgery, to make sure that the level is not too high or low.  Check your blood sugar the morning of your surgery when you wake up and every 2 hours until you get to the Short Stay unit.  If your blood sugar is less than 70 mg/dL, you will need to treat for low blood sugar: Do not take insulin . Treat a low blood sugar (less than 70 mg/dL) with   cup of clear juice (cranberry or apple), 4 glucose tablets, OR glucose gel. Recheck blood sugar in 15 minutes after treatment (to make sure it is greater than 70 mg/dL). If your blood sugar is not greater than 70 mg/dL on recheck, call [phone] for further instructions. Report your blood sugar to the short stay nurse when you get to Short Stay.  If you are admitted to the hospital after surgery: Your blood sugar will be checked by the staff and you will probably be given insulin  after surgery (instead of oral diabetes medicines) to make sure you have good blood sugar levels. The goal for blood sugar control after surgery is 80-180 mg/dL.                      Do NOT Smoke (Tobacco/Vaping) for 24 hours prior to your procedure.  If you use a CPAP at night, you may bring your mask/headgear for your overnight stay.   You will be asked to remove any contacts, glasses, piercing's, hearing aid's, dentures/partials prior to surgery. Please bring cases for these items if needed.    Patients discharged the day of surgery will not be allowed to drive home, and someone needs to stay with them for 24 hours.  SURGICAL WAITING ROOM VISITATION Patients may have no more than 2 support people in the waiting area - these visitors may rotate.   Pre-op nurse will coordinate an appropriate time for 1 ADULT support person, who may not rotate, to accompany patient in pre-op.  Children under the age of 38 must have an adult with them who is not the patient and must remain in the main waiting area with an adult.  If the patient needs to stay at the hospital during part of their recovery, the visitor guidelines for inpatient rooms apply.  Please refer to the [facility] website for the visitor guidelines for any additional information.   If you received a COVID test during your pre-op visit  it is requested that you wear a mask when out in public, stay away from anyone that may not be feeling well and notify  your surgeon if you develop symptoms. If you have been in contact with anyone that has tested positive in the last 10 days please notify you surgeon.      Pre-operative 4 CHG Bathing Instructions   You can play a key role in reducing the risk of infection after surgery. Your skin needs to be as free of germs as possible. You can reduce the number of germs on your skin by washing with CHG (chlorhexidine  gluconate) soap before surgery. CHG is an antiseptic soap that kills germs and continues to kill germs even after washing.   DO NOT use if you have an allergy to chlorhexidine /CHG or antibacterial soaps. If your skin becomes reddened or irritated, stop using the CHG and notify one of our RNs at [phone].   Please shower with the CHG soap starting  4 days before surgery using the following schedule:     Please keep in mind the following:  You may shave your face at any point before/day of surgery.  Place clean sheets on your bed the day you start using CHG soap. Use a clean washcloth (not used since being washed) for each shower. DO NOT sleep with pets once you start using the CHG.   CHG Shower Instructions:  Wash your face and private area with normal soap. If you choose to wash your hair, wash first with your normal shampoo.  After you use shampoo/soap, rinse your hair and body thoroughly to remove shampoo/soap residue.  Turn the water OFF and apply  bottle of CHG soap to a CLEAN washcloth.  Apply CHG soap ONLY FROM YOUR NECK DOWN TO YOUR TOES (washing for 3-5 minutes)  DO NOT use CHG soap on face, private areas, open wounds, or sores.  Pay special attention to the area where your surgery is being performed.  If you are having back surgery, having someone wash your back for you may be helpful. Wait 2 minutes after CHG soap is applied, then you may rinse off the CHG soap.  Pat dry with a clean towel  Put on clean clothes/pajamas   If you choose to wear lotion, please use ONLY the  CHG-compatible lotions that are listed below.  Additional instructions for the day of surgery:  If you choose, you may shower the morning of surgery with an antibacterial soap.  DO NOT APPLY any lotions, deodorants or cologne.   Do not bring valuables to the hospital. [facility] is not responsible for any belongings/valuables. Do not wear jewelry  Put on clean/comfortable clothes.  Please brush your teeth.  Ask your nurse before applying any prescription medications to the skin.     CHG Compatible Lotions   Aveeno Moisturizing lotion  Cetaphil Moisturizing Cream  Cetaphil Moisturizing Lotion  Clairol Herbal Essence Moisturizing Lotion, Dry Skin  Clairol Herbal Essence Moisturizing Lotion, Extra Dry Skin  Clairol Herbal Essence Moisturizing Lotion, Normal Skin  Curel Age Defying Therapeutic Moisturizing Lotion with Alpha Hydroxy  Curel Extreme Care Body Lotion  Curel Soothing Hands Moisturizing Hand Lotion  Curel Therapeutic Moisturizing Cream, Fragrance-Free  Curel Therapeutic Moisturizing Lotion, Fragrance-Free  Curel Therapeutic Moisturizing Lotion, Original Formula  Eucerin Daily Replenishing Lotion  Eucerin Dry Skin Therapy Plus Alpha Hydroxy Crme  Eucerin Dry Skin Therapy Plus Alpha Hydroxy Lotion  Eucerin Original Crme  Eucerin Original Lotion  Eucerin Plus Crme Eucerin Plus Lotion  Eucerin TriLipid Replenishing Lotion  Keri Anti-Bacterial Hand Lotion  Keri Deep Conditioning Original Lotion Dry Skin Formula Softly Scented  Keri Deep Conditioning Original Lotion, Fragrance Free Sensitive Skin Formula  Keri Lotion Fast Absorbing Fragrance Free Sensitive Skin Formula  Keri Lotion Fast Absorbing Softly Scented Dry Skin Formula  Keri Original Lotion  Keri Skin Renewal Lotion Keri Silky Smooth Lotion  Keri Silky Smooth Sensitive Skin Lotion  Nivea Body Creamy Conditioning Oil  Nivea Body Extra Enriched Lotion  Nivea Body Original Lotion  Nivea Body Sheer  Moisturizing Lotion Nivea Crme  Nivea Skin Firming Lotion  NutraDerm 30 Skin Lotion  NutraDerm Skin Lotion  NutraDerm Therapeutic Skin Cream  NutraDerm Therapeutic Skin Lotion  ProShield Protective Hand Cream  Provon moisturizing lotion  Please read over the following fact sheets that you were given.

## 2024-03-20 ENCOUNTER — Encounter (HOSPITAL_COMMUNITY): Payer: Self-pay

## 2024-03-20 ENCOUNTER — Encounter (HOSPITAL_COMMUNITY)
Admission: RE | Admit: 2024-03-20 | Discharge: 2024-03-20 | Disposition: A | Discharge status: Home / Self Care | Source: Ambulatory Visit | Attending: Orthopedic Surgery | Admitting: Orthopedic Surgery

## 2024-03-20 ENCOUNTER — Other Ambulatory Visit: Payer: Self-pay

## 2024-03-20 VITALS — BP 164/106 | HR 88 | Temp 98.3°F | Resp 17 | Ht 70.0 in | Wt 257.4 lb

## 2024-03-20 DIAGNOSIS — J45909 Unspecified asthma, uncomplicated: Secondary | ICD-10-CM | POA: Diagnosis not present

## 2024-03-20 DIAGNOSIS — Z79899 Other long term (current) drug therapy: Secondary | ICD-10-CM | POA: Diagnosis not present

## 2024-03-20 DIAGNOSIS — E119 Type 2 diabetes mellitus without complications: Secondary | ICD-10-CM | POA: Insufficient documentation

## 2024-03-20 DIAGNOSIS — Z01812 Encounter for preprocedural laboratory examination: Secondary | ICD-10-CM | POA: Insufficient documentation

## 2024-03-20 DIAGNOSIS — I2510 Atherosclerotic heart disease of native coronary artery without angina pectoris: Secondary | ICD-10-CM | POA: Insufficient documentation

## 2024-03-20 DIAGNOSIS — G4733 Obstructive sleep apnea (adult) (pediatric): Secondary | ICD-10-CM | POA: Insufficient documentation

## 2024-03-20 DIAGNOSIS — Z7984 Long term (current) use of oral hypoglycemic drugs: Secondary | ICD-10-CM | POA: Insufficient documentation

## 2024-03-20 DIAGNOSIS — Z7985 Long-term (current) use of injectable non-insulin antidiabetic drugs: Secondary | ICD-10-CM | POA: Insufficient documentation

## 2024-03-20 DIAGNOSIS — I428 Other cardiomyopathies: Secondary | ICD-10-CM | POA: Insufficient documentation

## 2024-03-20 DIAGNOSIS — Z01818 Encounter for other preprocedural examination: Secondary | ICD-10-CM

## 2024-03-20 DIAGNOSIS — Z7982 Long term (current) use of aspirin: Secondary | ICD-10-CM | POA: Diagnosis not present

## 2024-03-20 DIAGNOSIS — I5022 Chronic systolic (congestive) heart failure: Secondary | ICD-10-CM | POA: Diagnosis not present

## 2024-03-20 DIAGNOSIS — I110 Hypertensive heart disease with heart failure: Secondary | ICD-10-CM | POA: Insufficient documentation

## 2024-03-20 DIAGNOSIS — M4712 Other spondylosis with myelopathy, cervical region: Secondary | ICD-10-CM | POA: Diagnosis not present

## 2024-03-20 DIAGNOSIS — Z87891 Personal history of nicotine dependence: Secondary | ICD-10-CM | POA: Insufficient documentation

## 2024-03-20 LAB — BASIC METABOLIC PANEL WITH GFR
Anion gap: 9 (ref 5–15)
BUN: 10 mg/dL (ref 6–20)
CO2: 30 mmol/L (ref 22–32)
Calcium: 9.2 mg/dL (ref 8.9–10.3)
Chloride: 102 mmol/L (ref 98–111)
Creatinine, Ser: 0.85 mg/dL (ref 0.61–1.24)
GFR, Estimated: >60 mL/min
Glucose, Bld: 99 mg/dL (ref 70–99)
Potassium: 3.5 mmol/L (ref 3.5–5.1)
Sodium: 141 mmol/L (ref 135–145)

## 2024-03-20 LAB — HEMOGLOBIN A1C
Hgb A1c MFr Bld: 5.7 % — ABNORMAL HIGH (ref 4.8–5.6)
Mean Plasma Glucose: 116.89 mg/dL

## 2024-03-20 LAB — CBC
HCT: 41.5 % (ref 39.0–52.0)
Hemoglobin: 14.2 g/dL (ref 13.0–17.0)
MCH: 28.1 pg (ref 26.0–34.0)
MCHC: 34.2 g/dL (ref 30.0–36.0)
MCV: 82.2 fL (ref 80.0–100.0)
Platelets: 244 K/uL (ref 150–400)
RBC: 5.05 MIL/uL (ref 4.22–5.81)
RDW: 15 % (ref 11.5–15.5)
WBC: 7.4 K/uL (ref 4.0–10.5)
nRBC: 0 % (ref 0.0–0.2)

## 2024-03-20 LAB — GLUCOSE, CAPILLARY: Glucose-Capillary: 103 mg/dL — ABNORMAL HIGH (ref 70–99)

## 2024-03-20 LAB — SURGICAL PCR SCREEN
MRSA, PCR: NEGATIVE
Staphylococcus aureus: POSITIVE — AB

## 2024-03-20 NOTE — Progress Notes (Addendum)
 PCP - Dr. Quintin sheerer [place] Cardiologist - Dr. Soyla Merck  PPM/ICD - denies   Chest x-ray - 05/31/21 EKG - 01/10/24 Stress Test - denies ECHO - 09/09/21 Cardiac Cath - denies  Sleep Study - OSA+ CPAP - nightly, pt unsure of pressure settings  Fasting Blood Sugar - 100-120 Checks Blood Sugar once a day  Last dose of GLP1 agonist-  03/15/24 GLP1 instructions: Hold 7 days  ASA/Blood Thinner Instructions: n/a   ERAS Protcol - clears until 0430 PRE-SURGERY G2- given  COVID TEST- n/a   Anesthesia review: yes, cardiac hx. High BP at PAT appt. Pt instructed to monitor at home and write down readings. If diastolic stays greater than 100, pt instructed to reach out to PCP and see if they want to make any changes to meds prior to surgery.  Patient denies shortness of breath, fever, cough and chest pain at PAT appointment   All instructions explained to the patient, with a verbal understanding of the material. Patient agrees to go over the instructions while at home for a better understanding. The opportunity to ask questions was provided.

## 2024-03-20 NOTE — Progress Notes (Signed)
 Surgical Instructions   Your procedure is scheduled on Tuesday March 25, 2024. Report to [facility] Main Entrance A at 5:30 A.M., then check in with the Admitting office. Any questions or running late day of surgery: call [phone]  Questions prior to your surgery date: call [phone], Monday-Friday, 8am-4pm. If you experience any cold or flu symptoms such as cough, fever, chills, shortness of breath, etc. between now and your scheduled surgery, please notify us  at the above number.     Remember:  Do not eat after midnight the night before your surgery  You may drink clear liquids until 4:30 the morning of your surgery.   Clear liquids allowed are: Water, Non-Citrus Juices (without pulp), Carbonated Beverages, Clear Tea (no milk, honey, etc.), Black Coffee Only (NO MILK, CREAM OR POWDERED CREAMER of any kind), and Gatorade.  Patient Instructions  The night before surgery:  No food after midnight. ONLY clear liquids after midnight  The day of surgery (if you have diabetes): Drink ONE (1) 12 oz G2 given to you in your pre admission testing appointment by 4:30 the morning of surgery. Drink in one sitting. Do not sip.  This drink was given to you during your hospital  pre-op appointment visit.  Nothing else to drink after completing the  12 oz bottle of G2.         If you have questions, please contact your surgeons office.   Take these medicines the morning of surgery with A SIP OF WATER  amLODipine  (NORVASC )  ARIPiprazole  (ABILIFY )  buPROPion  (WELLBUTRIN  XL)  carvedilol  (COREG )  cetirizine  (ZYRTEC )  DULoxetine  (CYMBALTA )  rosuvastatin  (CRESTOR )  hydrALAZINE  (APRESOLINE )     May take these medicines IF NEEDED: albuterol  (VENTOLIN  HFA) 108 (90 Base) MCG/ACT inhaler  Please bring with you to the hospital fluticasone  (FLONASE )  hydrOXYzine  (ATARAX )   One week prior to surgery, STOP taking any Aspirin  (unless otherwise instructed by your surgeon) Aleve , Naproxen ,  Ibuprofen, Motrin, Advil, Goody's, BC's, all herbal medications, fish oil, and non-prescription vitamins.    WHAT DO I DO ABOUT MY DIABETES MEDICATION?          DO NOT TAKE YOUR Dapagliflozin  Pro-metFORMIN  ER (XIGDUO  XR) 72 HOURS PRIOR TO SURGERY WITH THE LAST DOSE BEING 03/21/2024.            DO NOT TAKE YOUR Semaglutide  1 WEEK PRIOR TO SURGERY WITH THE LAST DOSE BEING NO LATER THAN 03/17/2024.       HOW TO MANAGE YOUR DIABETES BEFORE AND AFTER SURGERY  Why is it important to control my blood sugar before and after surgery? Improving blood sugar levels before and after surgery helps healing and can limit problems. A way of improving blood sugar control is eating a healthy diet by:  Eating less sugar and carbohydrates  Increasing activity/exercise  Talking with your doctor about reaching your blood sugar goals High blood sugars (greater than 180 mg/dL) can raise your risk of infections and slow your recovery, so you will need to focus on controlling your diabetes during the weeks before surgery. Make sure that the doctor who takes care of your diabetes knows about your planned surgery including the date and location.  How do I manage my blood sugar before surgery? Check your blood sugar at least 4 times a day, starting 2 days before surgery, to make sure that the level is not too high or low.  Check your blood sugar the morning of your surgery when you wake up and every 2 hours  until you get to the Short Stay unit.  If your blood sugar is less than 70 mg/dL, you will need to treat for low blood sugar: Do not take insulin . Treat a low blood sugar (less than 70 mg/dL) with  cup of clear juice (cranberry or apple), 4 glucose tablets, OR glucose gel. Recheck blood sugar in 15 minutes after treatment (to make sure it is greater than 70 mg/dL). If your blood sugar is not greater than 70 mg/dL on recheck, call [phone] for further instructions. Report your blood sugar to the short stay  nurse when you get to Short Stay.  If you are admitted to the hospital after surgery: Your blood sugar will be checked by the staff and you will probably be given insulin  after surgery (instead of oral diabetes medicines) to make sure you have good blood sugar levels. The goal for blood sugar control after surgery is 80-180 mg/dL.                      Do NOT Smoke (Tobacco/Vaping) for 24 hours prior to your procedure.  If you use a CPAP at night, you may bring your mask/headgear for your overnight stay.   You will be asked to remove any contacts, glasses, piercing's, hearing aid's, dentures/partials prior to surgery. Please bring cases for these items if needed.    Patients discharged the day of surgery will not be allowed to drive home, and someone needs to stay with them for 24 hours.  SURGICAL WAITING ROOM VISITATION Patients may have no more than 2 support people in the waiting area - these visitors may rotate.   Pre-op nurse will coordinate an appropriate time for 2 ADULT support persons, who may not rotate, to accompany patient in pre-op.  Children under the age of 71 must have an adult with them who is not the patient and must remain in the main waiting area with an adult.  If the patient needs to stay at the hospital during part of their recovery, the visitor guidelines for inpatient rooms apply.  Please refer to the [facility] website for the visitor guidelines for any additional information.   If you received a COVID test during your pre-op visit  it is requested that you wear a mask when out in public, stay away from anyone that may not be feeling well and notify your surgeon if you develop symptoms. If you have been in contact with anyone that has tested positive in the last 10 days please notify you surgeon.      Pre-operative 4 CHG Bathing Instructions   You can play a key role in reducing the risk of infection after surgery. Your skin needs to be as free of germs as  possible. You can reduce the number of germs on your skin by washing with CHG (chlorhexidine  gluconate) soap before surgery. CHG is an antiseptic soap that kills germs and continues to kill germs even after washing.   DO NOT use if you have an allergy to chlorhexidine /CHG or antibacterial soaps. If your skin becomes reddened or irritated, stop using the CHG and notify one of our RNs at [phone].   Please shower with the CHG soap starting 4 days before surgery using the following schedule:     Please keep in mind the following:  You may shave your face at any point before/day of surgery.  Place clean sheets on your bed the day you start using CHG soap. Use a clean washcloth (  not used since being washed) for each shower. DO NOT sleep with pets once you start using the CHG.   CHG Shower Instructions:  Wash your face and private area with normal soap. If you choose to wash your hair, wash first with your normal shampoo.  After you use shampoo/soap, rinse your hair and body thoroughly to remove shampoo/soap residue.  Turn the water OFF and apply  bottle of CHG soap to a CLEAN washcloth.  Apply CHG soap ONLY FROM YOUR NECK DOWN TO YOUR TOES (washing for 3-5 minutes)  DO NOT use CHG soap on face, private areas, open wounds, or sores.  Pay special attention to the area where your surgery is being performed.  If you are having back surgery, having someone wash your back for you may be helpful. Wait 2 minutes after CHG soap is applied, then you may rinse off the CHG soap.  Pat dry with a clean towel  Put on clean clothes/pajamas   If you choose to wear lotion, please use ONLY the CHG-compatible lotions that are listed below.  Additional instructions for the day of surgery:  If you choose, you may shower the morning of surgery with an antibacterial soap.  DO NOT APPLY any lotions, deodorants or cologne.   Do not bring valuables to the hospital. [facility] is not responsible for any  belongings/valuables. Do not wear jewelry  Put on clean/comfortable clothes.  Please brush your teeth.  Ask your nurse before applying any prescription medications to the skin.     CHG Compatible Lotions   Aveeno Moisturizing lotion  Cetaphil Moisturizing Cream  Cetaphil Moisturizing Lotion  Clairol Herbal Essence Moisturizing Lotion, Dry Skin  Clairol Herbal Essence Moisturizing Lotion, Extra Dry Skin  Clairol Herbal Essence Moisturizing Lotion, Normal Skin  Curel Age Defying Therapeutic Moisturizing Lotion with Alpha Hydroxy  Curel Extreme Care Body Lotion  Curel Soothing Hands Moisturizing Hand Lotion  Curel Therapeutic Moisturizing Cream, Fragrance-Free  Curel Therapeutic Moisturizing Lotion, Fragrance-Free  Curel Therapeutic Moisturizing Lotion, Original Formula  Eucerin Daily Replenishing Lotion  Eucerin Dry Skin Therapy Plus Alpha Hydroxy Crme  Eucerin Dry Skin Therapy Plus Alpha Hydroxy Lotion  Eucerin Original Crme  Eucerin Original Lotion  Eucerin Plus Crme Eucerin Plus Lotion  Eucerin TriLipid Replenishing Lotion  Keri Anti-Bacterial Hand Lotion  Keri Deep Conditioning Original Lotion Dry Skin Formula Softly Scented  Keri Deep Conditioning Original Lotion, Fragrance Free Sensitive Skin Formula  Keri Lotion Fast Absorbing Fragrance Free Sensitive Skin Formula  Keri Lotion Fast Absorbing Softly Scented Dry Skin Formula  Keri Original Lotion  Keri Skin Renewal Lotion Keri Silky Smooth Lotion  Keri Silky Smooth Sensitive Skin Lotion  Nivea Body Creamy Conditioning Oil  Nivea Body Extra Enriched Lotion  Nivea Body Original Lotion  Nivea Body Sheer Moisturizing Lotion Nivea Crme  Nivea Skin Firming Lotion  NutraDerm 30 Skin Lotion  NutraDerm Skin Lotion  NutraDerm Therapeutic Skin Cream  NutraDerm Therapeutic Skin Lotion  ProShield Protective Hand Cream  Provon moisturizing lotion  Please read over the following fact sheets that you were given.

## 2024-03-21 NOTE — Anesthesia Preprocedure Evaluation (Signed)
 "                                  Anesthesia Evaluation  Patient identified by MRN, date of birth, ID band Patient awake    Reviewed: Allergy & Precautions, NPO status , Patient's Chart, lab work & pertinent test results  History of Anesthesia Complications Negative for: history of anesthetic complications  Airway Mallampati: III  TM Distance: >3 FB Neck ROM: Full    Dental  (+) Teeth Intact, Dental Advisory Given   Pulmonary neg shortness of breath, asthma , sleep apnea and Continuous Positive Airway Pressure Ventilation , neg COPD, neg recent URI, Current Smoker and Patient abstained from smoking.   breath sounds clear to auscultation       Cardiovascular hypertension, Pt. on medications and Pt. on home beta blockers + CAD and +CHF   Rhythm:Regular  1. Left ventricular ejection fraction, by estimation, is 55%. The left  ventricle has normal function. The left ventricle has no regional wall  motion abnormalities. There is moderate concentric left ventricular  hypertrophy. Left ventricular diastolic  parameters are consistent with Grade II diastolic dysfunction  (pseudonormalization).   2. Right ventricular systolic function is normal. The right ventricular  size is normal.   3. Left atrial size was mild to moderately dilated.   4. The mitral valve is normal in structure. Trivial mitral valve  regurgitation. No evidence of mitral stenosis.   5. The aortic valve is normal in structure. Aortic valve regurgitation is  not visualized. No aortic stenosis is present.   6. Aortic dilatation noted. There is borderline dilatation of the  ascending aorta, measuring 38 mm.   7. The inferior vena cava is normal in size with greater than 50%  respiratory variability, suggesting right atrial pressure of 3 mmHg.     Neuro/Psych neg Seizures PSYCHIATRIC DISORDERS Anxiety Depression Bipolar Disorder    Neuromuscular disease    GI/Hepatic Neg liver ROS,GERD  Controlled,,   Endo/Other  diabetes, Type 2    Renal/GU negative Renal ROSLab Results      Component                Value               Date                      NA                       141                 03/20/2024                K                        3.5                 03/20/2024                CO2                      30                  03/20/2024                GLUCOSE  99                  03/20/2024                BUN                      10                  03/20/2024                CREATININE               0.85                03/20/2024                CALCIUM                   9.2                 03/20/2024                EGFR                     107                 01/17/2024                GFRNONAA                 >60                 03/20/2024                Musculoskeletal  (+) Arthritis ,    Abdominal   Peds  Hematology negative hematology ROS (+) Lab Results      Component                Value               Date                      WBC                      7.4                 03/20/2024                HGB                      14.2                03/20/2024                HCT                      41.5                03/20/2024                MCV                      82.2                03/20/2024                PLT  244                 03/20/2024              Anesthesia Other Findings   Reproductive/Obstetrics                              Anesthesia Physical Anesthesia Plan  ASA: 2  Anesthesia Plan:    Post-op Pain Management: Ofirmev  IV (intra-op)*   Induction: Intravenous  PONV Risk Score and Plan: 1 and Ondansetron , Dexamethasone  and Propofol  infusion  Airway Management Planned: Oral ETT  Additional Equipment: ClearSight  Intra-op Plan:   Post-operative Plan: Extubation in OR  Informed Consent: I have reviewed the patients History and Physical, chart, labs and discussed the procedure  including the risks, benefits and alternatives for the proposed anesthesia with the patient or authorized representative who has indicated his/her understanding and acceptance.     Dental advisory given  Plan Discussed with: CRNA  Anesthesia Plan Comments: (PAT note written 03/21/2024 by Harshaan Whang, PA-C.  )         Anesthesia Quick Evaluation  "

## 2024-03-21 NOTE — Progress Notes (Signed)
 Anesthesia Chart Review:  Case: [id number] Date/Time: 03/25/24 0715   Procedure: ANTERIOR CERVICAL DECOMPRESSION/DISCECTOMY FUSION 2 LEVELS - C3-4 and C5-6 anterior cervical discectomy and fusion with anterior instrumentation and allograft spacers.   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  History includes smoking, HTN, DM2, OSA (uses CPAP), CAD (non-obstructive by CCTA 05/2021), NICM, chronic systolic CHF, asthma, prior polysubstance abuse (including cocaine).   Patient was admitted in 05/2021 after presenting with chest pain and shortness of breath. High-sensitivity troponin was minimally elevated and flat in the 20s. Echo showed LVEF of 25-30% with global hypokinesis and moderate LVH as well as normal RV and mild MR. Coronary CTA showed a coronary calcium  score of 24 with only minimal soft plaque in the mid LAD. Cardiomyopathy felt to be due to longstanding uncontrolled BP. Renal artery ultrasound showed 1-59% stenosis of bilateral renal arteries. He was started on GDMT, but initially with medication compliance issues. Repeat TTE 08/2021 showed improvement of LVEF to 55% with moderate LVH, grade 2 diastolic dysfunction, and only trivial MR.   Last cardiology evaluation was on 01/28/2024 by Lucien Blanc, PA-C. He had an updated CCTA on 01/24/2024 that showed mild CAD in mid LAD, proximal first diagonal, and proximal RCA, 25-49% stenosis. CAC 33.8 (92nd percentile). Continue risk factor modification.  Crestor  increased to 40 mg at night (reported not starting yet).  Consider increasing hydralazine  to 75 mg twice daily if his home BP readings remain elevated.  Given recent CT CTA results he was cleared for surgery at acceptable risk.  No further testing is  needed.  He can hold his aspirin  for 5 to 7 days prior to surgery and resume when medically safe to do so.  5 to 57-month follow-up planned.   BP 164/106. He had recent visit with PCP on 02/25/2024 with reading og 137/88. He is on amlodipine , Entresto , carvedilol , eplerenone , hydrochlorothiazide , hydralazine . He monitor BP at home and was advised to contact PCP if DBP is consistently > 100.    A1c 5.7% on 03/20/2024.  On semaglutide  and Xigduo  XR. Last semaglutide  was on 03/15/2024. Last Xigduo  03/21/2024.  Anesthesia team to evaluate on the day of surgery. He will get VS on arrival.    VS: BP (!) 164/106   Pulse 88   Temp 36.8 C   Resp 17   Ht 5' 10 (1.778 m)   Wt 116.8 kg   SpO2 97%   BMI 36.94 kg/m  BP Readings from Last 3 Encounters:  03/20/24 (!) 164/106  02/25/24 137/88  01/28/24 (!) 150/90     PROVIDERS: de [place], Raymond J, MD is PCP  Loni Rushing, MD is cardiologist Thapa, [place], MD is endocrinologist   LABS: Labs reviewed: Acceptable for surgery. (all labs ordered are listed, but only abnormal results are displayed)  Labs Reviewed  SURGICAL PCR SCREEN - Abnormal; Notable for the following components:      Result Value   Staphylococcus aureus POSITIVE (*)    All other components within normal limits  GLUCOSE, CAPILLARY - Abnormal; Notable for the following components:   Glucose-Capillary 103 (*)    All other components within normal limits  HEMOGLOBIN A1C - Abnormal; Notable for the following components:   Hgb A1c MFr Bld 5.7 (*)  All other components within normal limits  CBC  BASIC METABOLIC PANEL WITH GFR     IMAGES: CT Chest (CCTA over read) 01/24/2024: IMPRESSION: No significant extracardiac findings.    EKG: 01/10/2024: Normal sinus rhythm Minimal voltage criteria for LVH, may be normal variant ( Sokolow-Lyon ) When compared with ECG of 12-Apr-2023 08:41, No significant change was found Confirmed by Lucien Blanc [phone]) on 01/10/2024 11:39:47  AM   CV: CTA Coronary 01/24/2024: IMPRESSION: 1. Mild CAD in mid LAD, proximal first diagonal, and proximal RCA, 25-49% stenosis, CADRADS 2. 2. Coronary calcium  score is 33.8, which places the patient in the 92nd percentile for age and sex matched control. 3. Normal coronary origins with left dominance. RECOMMENDATIONS: CAD-RADS 2. Mild non-obstructive CAD (25-49%). Consider non-atherosclerotic causes of chest pain. Consider preventive therapy and risk factor modification.   Echo 09/09/2021: IMPRESSIONS   1. Left ventricular ejection fraction, by estimation, is 55%. The left  ventricle has normal function. The left ventricle has no regional wall  motion abnormalities. There is moderate concentric left ventricular  hypertrophy. Left ventricular diastolic  parameters are consistent with Grade II diastolic dysfunction  (pseudonormalization).   2. Right ventricular systolic function is normal. The right ventricular  size is normal.   3. Left atrial size was mild to moderately dilated.   4. The mitral valve is normal in structure. Trivial mitral valve  regurgitation. No evidence of mitral stenosis.   5. The aortic valve is normal in structure. Aortic valve regurgitation is  not visualized. No aortic stenosis is present.   6. Aortic dilatation noted. There is borderline dilatation of the  ascending aorta, measuring 38 mm.   7. The inferior vena cava is normal in size with greater than 50%  respiratory variability, suggesting right atrial pressure of 3 mmHg.    Past Medical History:  Diagnosis Date   Anxiety    Asthma    CHF (congestive heart failure) (HCC)    Coronary artery disease    Depression    Diabetes mellitus type 2 in obese    Hypertension    Manic disorder (HCC)    Obesity    Sleep apnea    pt wears CPAP    Past Surgical History:  Procedure Laterality Date   CARPAL TUNNEL RELEASE Right 12/19/2021   Procedure: RIGHT CARPAL TUNNEL RELEASE;  Surgeon: Barbarann Oneil BROCKS, MD;  Location: [facility];  Service: Orthopedics;  Laterality: Right;   Dental procedure      MEDICATIONS:  Accu-Chek Softclix Lancets lancets   acetaminophen  (TYLENOL ) 500 MG tablet   albuterol  (VENTOLIN  HFA) 108 (90 Base) MCG/ACT inhaler   amLODipine  (NORVASC ) 10 MG tablet   ARIPiprazole  (ABILIFY ) 5 MG tablet   buPROPion  (WELLBUTRIN  XL) 150 MG 24 hr tablet   carvedilol  (COREG ) 25 MG tablet   cetirizine  (ZYRTEC ) 10 MG tablet   Dapagliflozin  Pro-metFORMIN  ER (XIGDUO  XR) 07-998 MG TB24   divalproex  (DEPAKOTE  ER) 500 MG 24 hr tablet   DULoxetine  (CYMBALTA ) 60 MG capsule   eplerenone  (INSPRA ) 25 MG tablet   fluticasone  (FLONASE ) 50 MCG/ACT nasal spray   glucose blood (ACCU-CHEK GUIDE TEST) test strip   hydrALAZINE  (APRESOLINE ) 50 MG tablet   hydrochlorothiazide  (MICROZIDE ) 12.5 MG capsule   hydrOXYzine  (ATARAX ) 25 MG tablet   Moringa Oleifera (MORINGA PO)   rosuvastatin  (CRESTOR ) 40 MG tablet   sacubitril -valsartan  (ENTRESTO ) 97-103 MG   Semaglutide , 1 MG/DOSE, 4 MG/3ML SOPN   tadalafil  (CIALIS ) 10 MG tablet   UNABLE TO  FIND   No current facility-administered medications for this encounter.    Isaiah Ruder, PA-C Surgical Short Stay/Anesthesiology [facility] Phone [phone] [facility] Phone [phone] 03/21/2024 3:28 PM

## 2024-03-22 ENCOUNTER — Other Ambulatory Visit: Payer: Self-pay | Admitting: Student

## 2024-03-23 ENCOUNTER — Other Ambulatory Visit: Payer: Self-pay

## 2024-03-24 ENCOUNTER — Other Ambulatory Visit: Payer: Self-pay

## 2024-03-24 ENCOUNTER — Other Ambulatory Visit (HOSPITAL_BASED_OUTPATIENT_CLINIC_OR_DEPARTMENT_OTHER): Payer: Self-pay

## 2024-03-24 ENCOUNTER — Other Ambulatory Visit (HOSPITAL_COMMUNITY): Payer: Self-pay

## 2024-03-24 MED ORDER — CARVEDILOL 25 MG PO TABS
25.0000 mg | ORAL_TABLET | Freq: Two times a day (BID) | ORAL | 3 refills | Status: AC
  Filled 2024-03-24: qty 180, 90d supply, fill #0

## 2024-03-25 ENCOUNTER — Other Ambulatory Visit: Payer: Self-pay

## 2024-03-25 ENCOUNTER — Ambulatory Visit (HOSPITAL_BASED_OUTPATIENT_CLINIC_OR_DEPARTMENT_OTHER): Payer: Self-pay

## 2024-03-25 ENCOUNTER — Encounter (HOSPITAL_COMMUNITY): Payer: Self-pay | Admitting: Vascular Surgery

## 2024-03-25 ENCOUNTER — Ambulatory Visit (HOSPITAL_COMMUNITY)

## 2024-03-25 ENCOUNTER — Observation Stay (HOSPITAL_COMMUNITY)
Admission: RE | Admit: 2024-03-25 | Discharge: 2024-03-26 | Disposition: A | Discharge status: Home / Self Care | Attending: Orthopedic Surgery | Admitting: Orthopedic Surgery

## 2024-03-25 ENCOUNTER — Encounter (HOSPITAL_COMMUNITY): Admission: RE | Disposition: A | Payer: Self-pay | Source: Home / Self Care | Attending: Orthopedic Surgery

## 2024-03-25 ENCOUNTER — Encounter (HOSPITAL_COMMUNITY): Payer: Self-pay | Admitting: Orthopedic Surgery

## 2024-03-25 DIAGNOSIS — I509 Heart failure, unspecified: Secondary | ICD-10-CM | POA: Diagnosis not present

## 2024-03-25 DIAGNOSIS — J45909 Unspecified asthma, uncomplicated: Secondary | ICD-10-CM | POA: Diagnosis not present

## 2024-03-25 DIAGNOSIS — Z01818 Encounter for other preprocedural examination: Principal | ICD-10-CM

## 2024-03-25 DIAGNOSIS — M4712 Other spondylosis with myelopathy, cervical region: Secondary | ICD-10-CM | POA: Diagnosis present

## 2024-03-25 DIAGNOSIS — I2510 Atherosclerotic heart disease of native coronary artery without angina pectoris: Secondary | ICD-10-CM | POA: Diagnosis not present

## 2024-03-25 DIAGNOSIS — F17200 Nicotine dependence, unspecified, uncomplicated: Secondary | ICD-10-CM | POA: Diagnosis not present

## 2024-03-25 DIAGNOSIS — E119 Type 2 diabetes mellitus without complications: Secondary | ICD-10-CM | POA: Insufficient documentation

## 2024-03-25 DIAGNOSIS — G959 Disease of spinal cord, unspecified: Secondary | ICD-10-CM | POA: Diagnosis present

## 2024-03-25 DIAGNOSIS — I110 Hypertensive heart disease with heart failure: Secondary | ICD-10-CM | POA: Diagnosis not present

## 2024-03-25 HISTORY — PX: ANTERIOR CERVICAL DECOMP/DISCECTOMY FUSION: SHX1161

## 2024-03-25 LAB — GLUCOSE, CAPILLARY
Glucose-Capillary: 132 mg/dL — ABNORMAL HIGH (ref 70–99)
Glucose-Capillary: 136 mg/dL — ABNORMAL HIGH (ref 70–99)
Glucose-Capillary: 174 mg/dL — ABNORMAL HIGH (ref 70–99)
Glucose-Capillary: 116 mg/dL — ABNORMAL HIGH (ref 70–99)
Glucose-Capillary: 132 mg/dL — ABNORMAL HIGH (ref 70–99)

## 2024-03-25 MED ORDER — INSULIN ASPART 100 UNIT/ML IJ SOLN: 0.0000 [IU] | Freq: Every day | INTRAMUSCULAR | Status: DC

## 2024-03-25 MED ORDER — LACTATED RINGERS IV SOLN: INTRAVENOUS | Status: DC

## 2024-03-25 MED ORDER — SENNA 8.6 MG PO TABS
1.0000 | ORAL_TABLET | Freq: Two times a day (BID) | ORAL | Status: DC
  Administered 2024-03-25 – 2024-03-26 (×2): 8.6 mg via ORAL
  Filled 2024-03-25 (×2): qty 1

## 2024-03-25 MED ORDER — FENTANYL CITRATE (PF) 100 MCG/2ML IJ SOLN
INTRAMUSCULAR | Status: AC
  Filled 2024-03-25: qty 2

## 2024-03-25 MED ORDER — PROPOFOL 10 MG/ML IV BOLUS
INTRAVENOUS | Status: DC | PRN
  Administered 2024-03-25: 20 mg via INTRAVENOUS
  Administered 2024-03-25: 30 mg via INTRAVENOUS
  Administered 2024-03-25: 170 mg via INTRAVENOUS
  Administered 2024-03-25: 30 mg via INTRAVENOUS

## 2024-03-25 MED ORDER — BUPROPION HCL ER (XL) 150 MG PO TB24
150.0000 mg | ORAL_TABLET | Freq: Every day | ORAL | Status: DC
  Administered 2024-03-26: 150 mg via ORAL
  Filled 2024-03-25: qty 1

## 2024-03-25 MED ORDER — TRANEXAMIC ACID-NACL 1000-0.7 MG/100ML-% IV SOLN
1000.0000 mg | Freq: Once | INTRAVENOUS | Status: AC
  Administered 2024-03-25: 1000 mg via INTRAVENOUS
  Filled 2024-03-25: qty 100

## 2024-03-25 MED ORDER — ALBUTEROL SULFATE HFA 108 (90 BASE) MCG/ACT IN AERS
INHALATION_SPRAY | RESPIRATORY_TRACT | Status: DC | PRN
  Administered 2024-03-25: 6 via RESPIRATORY_TRACT

## 2024-03-25 MED ORDER — DIVALPROEX SODIUM ER 500 MG PO TB24
500.0000 mg | ORAL_TABLET | Freq: Every day | ORAL | Status: DC
  Administered 2024-03-25: 500 mg via ORAL
  Filled 2024-03-25 (×2): qty 1

## 2024-03-25 MED ORDER — SUCCINYLCHOLINE CHLORIDE 200 MG/10ML IV SOSY
PREFILLED_SYRINGE | INTRAVENOUS | Status: DC | PRN
  Administered 2024-03-25: 140 mg via INTRAVENOUS

## 2024-03-25 MED ORDER — 0.9 % SODIUM CHLORIDE (POUR BTL) OPTIME
TOPICAL | Status: DC | PRN
  Administered 2024-03-25: 1000 mL

## 2024-03-25 MED ORDER — MUPIROCIN 2 % EX OINT
1.0000 | TOPICAL_OINTMENT | Freq: Two times a day (BID) | CUTANEOUS | Status: DC
  Administered 2024-03-25 – 2024-03-26 (×2): 1 via NASAL
  Filled 2024-03-25: qty 22

## 2024-03-25 MED ORDER — CARVEDILOL 25 MG PO TABS
25.0000 mg | ORAL_TABLET | Freq: Two times a day (BID) | ORAL | Status: DC
  Administered 2024-03-25 – 2024-03-26 (×2): 25 mg via ORAL
  Filled 2024-03-25 (×2): qty 1

## 2024-03-25 MED ORDER — PROPOFOL 1000 MG/100ML IV EMUL
INTRAVENOUS | Status: AC
  Filled 2024-03-25: qty 400

## 2024-03-25 MED ORDER — PHENYLEPHRINE HCL-NACL 20-0.9 MG/250ML-% IV SOLN
INTRAVENOUS | Status: DC | PRN
  Administered 2024-03-25: 30 ug/min via INTRAVENOUS

## 2024-03-25 MED ORDER — PHENYLEPHRINE 80 MCG/ML (10ML) SYRINGE FOR IV PUSH (FOR BLOOD PRESSURE SUPPORT)
PREFILLED_SYRINGE | INTRAVENOUS | Status: DC | PRN
  Administered 2024-03-25 (×3): 160 ug via INTRAVENOUS

## 2024-03-25 MED ORDER — LACTATED RINGERS IV SOLN: INTRAVENOUS | Status: DC | PRN

## 2024-03-25 MED ORDER — PROPOFOL 10 MG/ML IV BOLUS
INTRAVENOUS | Status: AC
  Filled 2024-03-25: qty 20

## 2024-03-25 MED ORDER — MIDAZOLAM HCL (PF) 2 MG/2ML IJ SOLN
INTRAMUSCULAR | Status: DC | PRN
  Administered 2024-03-25: 2 mg via INTRAVENOUS

## 2024-03-25 MED ORDER — DAPAGLIFLOZIN PROPANEDIOL 10 MG PO TABS
10.0000 mg | ORAL_TABLET | Freq: Every day | ORAL | Status: DC
  Administered 2024-03-26: 10 mg via ORAL
  Filled 2024-03-25: qty 1

## 2024-03-25 MED ORDER — SODIUM CHLORIDE 0.9 % IV SOLN
0.1500 ug/kg/min | INTRAVENOUS | Status: AC
  Administered 2024-03-25 (×2): .2 ug/kg/min via INTRAVENOUS
  Administered 2024-03-25: .15 ug/kg/min via INTRAVENOUS
  Filled 2024-03-25: qty 2000

## 2024-03-25 MED ORDER — MUPIROCIN 2 % EX OINT
TOPICAL_OINTMENT | CUTANEOUS | Status: AC
  Administered 2024-03-25: 1 via TOPICAL
  Filled 2024-03-25: qty 22

## 2024-03-25 MED ORDER — CEFAZOLIN SODIUM-DEXTROSE 2-4 GM/100ML-% IV SOLN
2.0000 g | Freq: Four times a day (QID) | INTRAVENOUS | Status: AC
  Administered 2024-03-25 (×2): 2 g via INTRAVENOUS
  Filled 2024-03-25 (×2): qty 100

## 2024-03-25 MED ORDER — PHENYLEPHRINE HCL-NACL 20-0.9 MG/250ML-% IV SOLN
INTRAVENOUS | Status: AC
  Filled 2024-03-25: qty 250

## 2024-03-25 MED ORDER — OXYCODONE HCL 5 MG PO TABS: 5.0000 mg | ORAL_TABLET | Freq: Once | ORAL | Status: DC | PRN

## 2024-03-25 MED ORDER — DEXAMETHASONE SOD PHOSPHATE PF 10 MG/ML IJ SOLN
10.0000 mg | Freq: Once | INTRAMUSCULAR | Status: AC
  Administered 2024-03-25: 10 mg via INTRAVENOUS
  Filled 2024-03-25: qty 1

## 2024-03-25 MED ORDER — SENNA 8.6 MG PO TABS
1.0000 | ORAL_TABLET | Freq: Two times a day (BID) | ORAL | 0 refills | Status: AC
  Filled 2024-03-25: qty 28, 14d supply, fill #0

## 2024-03-25 MED ORDER — ACETAMINOPHEN 500 MG PO TABS
1000.0000 mg | ORAL_TABLET | Freq: Three times a day (TID) | ORAL | Status: DC
  Administered 2024-03-25 – 2024-03-26 (×3): 1000 mg via ORAL
  Filled 2024-03-25 (×3): qty 2

## 2024-03-25 MED ORDER — POLYETHYLENE GLYCOL 3350 17 GM/SCOOP PO POWD
17.0000 g | PACK | Freq: Every day | ORAL | 0 refills | Status: AC
  Filled 2024-03-25: qty 238, 14d supply, fill #0

## 2024-03-25 MED ORDER — CEFAZOLIN SODIUM 1 G IJ SOLR
INTRAMUSCULAR | Status: AC
  Filled 2024-03-25: qty 20

## 2024-03-25 MED ORDER — POVIDONE-IODINE 10 % EX SWAB: 2.0000 | Freq: Once | CUTANEOUS | Status: DC

## 2024-03-25 MED ORDER — FENTANYL CITRATE (PF) 100 MCG/2ML IJ SOLN
25.0000 ug | INTRAMUSCULAR | Status: DC | PRN
  Administered 2024-03-25 (×2): 50 ug via INTRAVENOUS

## 2024-03-25 MED ORDER — ONDANSETRON HCL 4 MG/2ML IJ SOLN
INTRAMUSCULAR | Status: AC
  Filled 2024-03-25: qty 2

## 2024-03-25 MED ORDER — DEXAMETHASONE SOD PHOSPHATE PF 10 MG/ML IJ SOLN
INTRAMUSCULAR | Status: AC
  Filled 2024-03-25: qty 1

## 2024-03-25 MED ORDER — ONDANSETRON HCL 4 MG PO TABS: 4.0000 mg | ORAL_TABLET | Freq: Four times a day (QID) | ORAL | Status: DC | PRN

## 2024-03-25 MED ORDER — HYDRALAZINE HCL 20 MG/ML IJ SOLN
10.0000 mg | Freq: Once | INTRAMUSCULAR | Status: AC
  Administered 2024-03-25: 10 mg via INTRAVENOUS

## 2024-03-25 MED ORDER — HYDRALAZINE HCL 20 MG/ML IJ SOLN
INTRAMUSCULAR | Status: AC
  Filled 2024-03-25: qty 1

## 2024-03-25 MED ORDER — ALBUTEROL SULFATE HFA 108 (90 BASE) MCG/ACT IN AERS
INHALATION_SPRAY | RESPIRATORY_TRACT | Status: AC
  Filled 2024-03-25: qty 6.7

## 2024-03-25 MED ORDER — HYDROMORPHONE HCL 1 MG/ML IJ SOLN
INTRAMUSCULAR | Status: AC
  Filled 2024-03-25: qty 0.5

## 2024-03-25 MED ORDER — ONDANSETRON HCL 4 MG/2ML IJ SOLN: 4.0000 mg | Freq: Four times a day (QID) | INTRAMUSCULAR | Status: DC | PRN

## 2024-03-25 MED ORDER — ACETAMINOPHEN 10 MG/ML IV SOLN: 1000.0000 mg | Freq: Once | INTRAVENOUS | Status: DC | PRN

## 2024-03-25 MED ORDER — METHOCARBAMOL 500 MG PO TABS
500.0000 mg | ORAL_TABLET | Freq: Four times a day (QID) | ORAL | 0 refills | Status: AC
  Filled 2024-03-25: qty 40, 10d supply, fill #0

## 2024-03-25 MED ORDER — ACETAMINOPHEN 500 MG PO TABS
1000.0000 mg | ORAL_TABLET | Freq: Three times a day (TID) | ORAL | 0 refills | Status: AC
  Filled 2024-03-25: qty 84, 14d supply, fill #0

## 2024-03-25 MED ORDER — LIDOCAINE 2% (20 MG/ML) 5 ML SYRINGE
INTRAMUSCULAR | Status: DC | PRN
  Administered 2024-03-25: 80 mg via INTRAVENOUS

## 2024-03-25 MED ORDER — MUPIROCIN 2 % EX OINT: 1.0000 | TOPICAL_OINTMENT | Freq: Once | CUTANEOUS | Status: AC

## 2024-03-25 MED ORDER — FLUTICASONE PROPIONATE 50 MCG/ACT NA SUSP: 2.0000 | Freq: Every day | NASAL | Status: DC | PRN

## 2024-03-25 MED ORDER — LORATADINE 10 MG PO TABS
10.0000 mg | ORAL_TABLET | Freq: Every day | ORAL | Status: DC
  Administered 2024-03-25 – 2024-03-26 (×2): 10 mg via ORAL
  Filled 2024-03-25 (×2): qty 1

## 2024-03-25 MED ORDER — METFORMIN HCL ER 500 MG PO TB24
2000.0000 mg | ORAL_TABLET | Freq: Every day | ORAL | Status: DC
  Administered 2024-03-26: 2000 mg via ORAL
  Filled 2024-03-25: qty 4

## 2024-03-25 MED ORDER — POLYETHYLENE GLYCOL 3350 17 G PO PACK
17.0000 g | PACK | Freq: Every day | ORAL | Status: DC
  Administered 2024-03-25 – 2024-03-26 (×2): 17 g via ORAL
  Filled 2024-03-25 (×2): qty 1

## 2024-03-25 MED ORDER — EPLERENONE 25 MG PO TABS
25.0000 mg | ORAL_TABLET | Freq: Every day | ORAL | Status: DC
  Administered 2024-03-25 – 2024-03-26 (×2): 25 mg via ORAL
  Filled 2024-03-25 (×2): qty 1

## 2024-03-25 MED ORDER — SODIUM CHLORIDE 0.9 % IV SOLN
0.1500 ug/kg/min | INTRAVENOUS | Status: DC
  Filled 2024-03-25 (×2): qty 2000

## 2024-03-25 MED ORDER — HYDROMORPHONE HCL 1 MG/ML IJ SOLN: 0.5000 mg | INTRAMUSCULAR | Status: AC | PRN

## 2024-03-25 MED ORDER — HYDROMORPHONE HCL 1 MG/ML IJ SOLN
INTRAMUSCULAR | Status: DC | PRN
  Administered 2024-03-25: .25 mg via INTRAVENOUS

## 2024-03-25 MED ORDER — PHENYLEPHRINE 80 MCG/ML (10ML) SYRINGE FOR IV PUSH (FOR BLOOD PRESSURE SUPPORT)
PREFILLED_SYRINGE | INTRAVENOUS | Status: AC
  Filled 2024-03-25: qty 10

## 2024-03-25 MED ORDER — ARIPIPRAZOLE 5 MG PO TABS
5.0000 mg | ORAL_TABLET | Freq: Every day | ORAL | Status: DC
  Administered 2024-03-26: 5 mg via ORAL
  Filled 2024-03-25: qty 1

## 2024-03-25 MED ORDER — AMLODIPINE BESYLATE 10 MG PO TABS
10.0000 mg | ORAL_TABLET | Freq: Every day | ORAL | Status: DC
  Administered 2024-03-26: 10 mg via ORAL
  Filled 2024-03-25: qty 1

## 2024-03-25 MED ORDER — CHLORHEXIDINE GLUCONATE 0.12 % MT SOLN
15.0000 mL | Freq: Once | OROMUCOSAL | Status: AC
  Administered 2024-03-25: 15 mL via OROMUCOSAL
  Filled 2024-03-25: qty 15

## 2024-03-25 MED ORDER — TRANEXAMIC ACID-NACL 1000-0.7 MG/100ML-% IV SOLN
1000.0000 mg | INTRAVENOUS | Status: AC
  Administered 2024-03-25: 1000 mg via INTRAVENOUS
  Filled 2024-03-25: qty 100

## 2024-03-25 MED ORDER — DULOXETINE HCL 60 MG PO CPEP
60.0000 mg | ORAL_CAPSULE | Freq: Two times a day (BID) | ORAL | Status: DC
  Administered 2024-03-25 – 2024-03-26 (×2): 60 mg via ORAL
  Filled 2024-03-25 (×2): qty 1

## 2024-03-25 MED ORDER — CEFAZOLIN SODIUM-DEXTROSE 2-4 GM/100ML-% IV SOLN
2.0000 g | INTRAVENOUS | Status: AC
  Administered 2024-03-25 (×2): 2 g via INTRAVENOUS
  Filled 2024-03-25: qty 100

## 2024-03-25 MED ORDER — LABETALOL HCL 5 MG/ML IV SOLN
INTRAVENOUS | Status: DC | PRN
  Administered 2024-03-25 (×4): 5 mg via INTRAVENOUS

## 2024-03-25 MED ORDER — OXYCODONE HCL 5 MG PO TABS
5.0000 mg | ORAL_TABLET | ORAL | Status: DC | PRN
  Administered 2024-03-25 – 2024-03-26 (×4): 10 mg via ORAL
  Filled 2024-03-25 (×4): qty 2

## 2024-03-25 MED ORDER — INSULIN ASPART 100 UNIT/ML IJ SOLN
0.0000 [IU] | Freq: Three times a day (TID) | INTRAMUSCULAR | Status: DC
  Filled 2024-03-25: qty 3
  Filled 2024-03-25: qty 5

## 2024-03-25 MED ORDER — ONDANSETRON HCL 4 MG/2ML IJ SOLN
INTRAMUSCULAR | Status: DC | PRN
  Administered 2024-03-25: 4 mg via INTRAVENOUS

## 2024-03-25 MED ORDER — HYDROCHLOROTHIAZIDE 12.5 MG PO TABS
12.5000 mg | ORAL_TABLET | Freq: Every day | ORAL | Status: DC
  Administered 2024-03-25 – 2024-03-26 (×2): 12.5 mg via ORAL
  Filled 2024-03-25 (×3): qty 1

## 2024-03-25 MED ORDER — PROPOFOL 500 MG/50ML IV EMUL
INTRAVENOUS | Status: DC | PRN
  Administered 2024-03-25: 120 ug/kg/min via INTRAVENOUS
  Administered 2024-03-25: 150 ug/kg/min via INTRAVENOUS

## 2024-03-25 MED ORDER — MIDAZOLAM HCL 2 MG/2ML IJ SOLN
INTRAMUSCULAR | Status: AC
  Filled 2024-03-25: qty 2

## 2024-03-25 MED ORDER — DAPAGLIFLOZIN PRO-METFORMIN ER 5-1000 MG PO TB24: 2.0000 | ORAL_TABLET | Freq: Every day | ORAL | Status: DC

## 2024-03-25 MED ORDER — OXYCODONE HCL 5 MG/5ML PO SOLN: 5.0000 mg | Freq: Once | ORAL | Status: DC | PRN

## 2024-03-25 MED ORDER — OXYCODONE HCL 5 MG PO TABS
5.0000 mg | ORAL_TABLET | ORAL | 0 refills | Status: AC | PRN
  Filled 2024-03-25: qty 40, 4d supply, fill #0

## 2024-03-25 MED ORDER — METHOCARBAMOL 500 MG PO TABS
500.0000 mg | ORAL_TABLET | Freq: Four times a day (QID) | ORAL | Status: DC
  Administered 2024-03-25 – 2024-03-26 (×3): 500 mg via ORAL
  Filled 2024-03-25 (×3): qty 1

## 2024-03-25 MED ORDER — SUCCINYLCHOLINE CHLORIDE 200 MG/10ML IV SOSY
PREFILLED_SYRINGE | INTRAVENOUS | Status: AC
  Filled 2024-03-25: qty 10

## 2024-03-25 MED ORDER — LIDOCAINE 2% (20 MG/ML) 5 ML SYRINGE
INTRAMUSCULAR | Status: AC
  Filled 2024-03-25: qty 5

## 2024-03-25 MED ORDER — ALBUTEROL SULFATE (2.5 MG/3ML) 0.083% IN NEBU: 2.5000 mg | INHALATION_SOLUTION | RESPIRATORY_TRACT | Status: DC | PRN

## 2024-03-25 MED ORDER — ORAL CARE MOUTH RINSE: 15.0000 mL | Freq: Once | OROMUCOSAL | Status: AC

## 2024-03-25 MED ORDER — FENTANYL CITRATE (PF) 250 MCG/5ML IJ SOLN
INTRAMUSCULAR | Status: DC | PRN
  Administered 2024-03-25: 50 ug via INTRAVENOUS
  Administered 2024-03-25: 100 ug via INTRAVENOUS

## 2024-03-25 NOTE — Op Note (Signed)
 Orthopedic Spine Surgery Operative Report  Procedure: C3/4, C5/6 anterior cervical discectomy and fusion C3/4 and C5/6 placement of structural allograft interbody spacer  C3/4 and C5/6 anterior plate instrumentation Intraoperative use of microscope  Modifier: none  Date of procedure: 03/26/2023  Patient name: Jordan Sparks MRN: [id number] DOB: 13-May-1975  Surgeon: Ozell Ada, MD Assistant: none Pre-operative diagnosis: cervical myelopathy Post-operative diagnosis: same as above Findings: anterior osteophyte formation at C3/4 and C5/6  Specimens: none Anesthesia: general EBL: 50cc Complications: none Pre-incision antibiotic: ancef  Pre-incision decadron : 10mg  TXA was given prior to incision  Implants:  Implant Name Type Inv. Item Serial No. Manufacturer Lot No. LRB No. Used Action  LOG [id number] - NUVASIVE ACCUFIX ANTERIOR CERVICAL PLATING SET - 1          [employer] CC [id number] - [id number] Bone Implant ALLOGRAFT [employer] CC [id number] [facility] MEDICAL  N/A 1 Implanted  ALLOGRAFT [employer]IC [id number] - [id number] Bone Implant ALLOGRAFT [employer] [id number] [employer]  N/A 1 Implanted  PLATE ACP 1-LEVEL 1.6V20 - [id number] Plate PLATE ACP 1-LEVEL 1.6V20  [employer]  N/A 1 Implanted  PLATE ACP 8-OZCZO8.3C77 - [id number] Plate PLATE ACP 8-OZCZO8.3C77  [employer]  N/A 1 Implanted  SCREW ACP VA SD 3.5X15 - [id number] Screw SCREW ACP VA SD 3.5X15  [employer]  N/A 8 Implanted      Indication for procedure: Patient is a 49 y.o. male who presented to the office with signs and symptoms consistent with cervical myelopathy. The natural history of cervical myelopathy was explained to the patient. After this discussion, surgical intervention was recommended. The pre-operative MRI showed stenosis at C3/4 and C5/6 so a C3/4 and C5/6 disc arthroplasty was discussed as an option but this was denied by his insurance. His insurance later approved C3/4  and C5/6 anterior cervical discectomy and fusion. The risks, benefits, alternatives of the proposed surgery were covered with the patient. All the patient's questions were answered to their satisfaction. After this discussion, the patient expressed understanding and elected to proceed with surgical intervention.    Procedure Description: The patient was met in the pre-operative holding area. The patient's identity and consent were verified. The operative site was marked. The patient's remaining questions about the surgery were answered. The patient was brought back to the operating room. General anesthesia was induced and an endotracheal tube was placed by the anesthesia staff. The patient was transferred to the flat top [place] table in the supine position. All bony prominences were well padded. A bump was placed underneath the patient's shoulders to extend the neck slightly.  Neuromonitoring leads were attached to the patient by the neuromonitoring technologist. The patient's shoulders were than taped to the bed to improve the fluoroscopic visualization during the procedure. His facial hair over the neck was shaved with an neurosurgeon. The surgical area was cleansed with alcohol. Neuromonitoring baseline signals were obtained. Fluoroscopy was then brought in to check rotation on the AP image and to mark the levels on the lateral image. The patient's skin was then prepped and draped in a standard, sterile fashion. A time out was performed that identified the patient, the procedure, and the operative levels. All team members agreed with what was stated in the time out.    A left-sided transverse incision was made in the middle of the previously marked levels. Incision was taken sharply down through the skin and dermis. Electrocautery was used to dissect down to the level of the platysma. A Metzenbaum scissors was used  to elevate flaps both cranially and caudally above the platysma. A small opening was made in  the platysma with the Metzenbaum scissors. The scissors were then placed under the platysma and electrocautery was used on top of the scissors to split the platysma. Blunt dissection was carried out medial to the sternocleidomastoid to identify the omohyoid. The omohyoid was then dissected over its lateral aspect with the Metzenbaum scissors. An appendiceal retractor was placed around the carotid sheath to retract it laterally and a cloward retractor was placed medially to retractor the esophagus and trachea medially. The interval between these structures was then bluntly dissected with the Metzenbaum scissors. At this point, the prevertebral fascia was visible within the wound. A kittner was used to dissect the prevertebral fascia off the vertebral bodies and discs. A metallic sucker tip was placed over the disc thought to be the correct level. A lateral fluoroscopic shot was then used to confirm the level. Once the correct level was identified, electrocautery was used to mark the disc space. The retractors that were previously placed were then put back into the wound. Electrocautery was used to elevate the longus coli off the bone on each side at C3/4 and at C5/6.   The operative microscope was brought in at this time to improve lighting and visualization. Shadowline retractor blades were then placed under the longus coli on each side and the self-retainer was attached to these retractor blades. A long handle fifteen blade was then used to incise the C3/4 disc space along the endplates and longitudinally to connect these end plate incisions to create a rectangular incision in the disc. A pituitary was used to remove the incised disc material. A 2 kerrison was used to remove the anterior osteophyte and square the inferior endplate of the cranial vertebra. A combination of straight and curved curettes were used to remove further disc material and the cartilaginous aspect of the endplates. This was done until both  the inferior and superior endplates had been prepared from the uncus to uncus and from ventral vertebral body to the posterior osteophytes. A burr was then used to remove the posterior vertebral osteophytes. Once the PLL was visualized within the wound, a nerve hook was used to develop the plane between the PLL and the dura. While lifting up the PLL with the nerve hook, a 1 kerrison was placed under the PLL and used to remove the PLL. Once some of the PLL had been removed, a combination of 1 and 2 kerrisons were used to continue removing the PLL until it had been completely removed. A curved curette was then placed into the foramen and pointed ventrally. It was used to remove soft tissue and the deep aspect of the uncus. A nerve hook was then easily passed into the foramen. The same process was repeated for the other foramen. Lateral fluoroscopic imaging was used to guide the insertion of trials into the disc space in a serial fashion, starting with the smallest trial. Once a good fit was obtained, the trial was left in and neuromonitoring signals were checked. There were no changes from baseline. AP and lateral fluoroscopic images were obtained and confirmed satisfactory position of the trial. A 7 was selected for use as the final implant. The structural allograft interbody implant was then placed into the prepared disc space and lightly malleted into place. Neuromonitoring signals were checked again and no changes from baseline were noted. AP and lateral fluoroscopic images was obtained and confirmed satisfactory position of the  final implant. A 20mm plate was then selected and placed over the C3/4 disc space. No soft tissue structures were underneath the plate. An awl was put into the wound and put into one of the screw holes in the plate. It was aimed slightly medially. It was used to create a hole in the vertebra. A 15mm screw was then inserted into the created hole. The same steps were repeated with the awl  and screw to place the same sized screws in the remaining holes within the plate. Again, the wound was checked and no soft tissue structures were seen underneath the plate.  The screws were final tightened in place.  Next attention was turned to the C5/6 disc space. The retractors were removed and then replaced at C5/6. Shadowline retractor blades were placed under the longus coli on each side at C5/6 and the self-retainer was attached to these retractor blades. A long handle fifteen blade was then used to incise the C5/6 disc space along the endplates and longitudinally to connect these end plate incisions to create a rectangular incision in the disc. A pituitary was used to remove the incised disc material. A 2 kerrison was used to remove the anterior osteophyte and square the inferior endplate of the cranial vertebra. A combination of straight and curved curettes were used to remove further disc material and the cartilaginous aspect of the endplates. This was done until both the inferior and superior endplates had been prepared from the uncus to uncus and from ventral vertebral body to the posterior osteophytes. A burr was then used to remove the posterior vertebral osteophytes. Once the PLL was visualized within the wound, a nerve hook was used to develop the plane between the PLL and the dura. While lifting up the PLL with the nerve hook, a 1 kerrison was placed under the PLL and used to remove the PLL. Once some of the PLL had been removed, a combination of 1 and 2 kerrisons were used to continue removing the PLL until it had been completely removed. A curved curette was then placed into the foramen and pointed ventrally. It was used to remove soft tissue and the deep aspect of the uncus. A nerve hook was then easily passed into the foramen. The same process was repeated for the other foramen. Lateral fluoroscopic imaging was used to guide the insertion of trials into the disc space in a serial fashion,  starting with the smallest trial. Once a good fit was obtained, the trial was left in and neuromonitoring signals were checked. There were no changes from baseline. AP and lateral fluoroscopic images were obtained and confirmed satisfactory position of the trial. A 8 was selected for use as the final implant. The structural allograft interbody implant was then placed into the prepared disc space and lightly malleted into place. Neuromonitoring signals were checked again and no changes from baseline were noted. AP and lateral fluoroscopic images was obtained and confirmed satisfactory position of the final implant. A 22mm plate was then selected and placed over the C5/6 disc space. No soft tissue structures were underneath the plate. An awl was put into the wound and put into one of the screw holes in the plate. It was aimed slightly medially. It was used to create a hole in the vertebra. A 15mm screw was then inserted into the created hole. The same steps were repeated with the awl and screw to place the same sized screws in the remaining holes within the plate. Again, the  wound was checked and no soft tissue structures were seen underneath the plate. The screws were final tightened in place.  Final AP and lateral fluoroscopic films were taken and confirmed satisfactory position of the plate and interbody implants. Hemostasis was achieved. The platysma was reapproximated using 2-0 vicryl. Neuromonitoring was checked and there were no changes from baseline. Neuromonitoring was discontinued at this time. The deep dermal layer was closed with 2-0 vicryl. The skin was closed with a 3-0 monocryl. All counts were correct at the end of the procedure. Dermabond was applied over the skin. The wound was dressed with 4x4 gauze and tegaderms. The patient was awakened from anesthesia and transferred to a bed. The patient was brought back to the post-anesthesia care unit in stable condition by the anesthesia staff.    Post-operative plan: The patient will recover in the post-anesthesia care unit and then go to the floor. The patient will receive two post-operative doses ancef  and another dose of TXA. The patient will be out of bed as tolerate, no collar or brace is needed. The patient will work with physical therapy.The patient will likely discharge to home tomorrow.   Ozell Ada, MD Orthopedic Surgeon

## 2024-03-25 NOTE — Discharge Instructions (Signed)
 Orthopedic Surgery Discharge Instructions  Patient name: Jordan Sparks Procedure Performed: C3/4, C5/6 anterior cervical discectomy and fusion  Date of Surgery: 03/25/2024 Surgeon: Ozell Ada, MD  Pre-operative Diagnosis: cervical myelopathy Post-operative Diagnosis: same as above  Discharged to: home Discharge Condition: stable  Activity: You do not need any collar or brace after this surgery. You should refrain from bending, lifting, or twisting with objects greater than ten pounds until three months after surgery. You are encouraged to walk as much as desired. You can perform household activities such as cleaning dishes, doing laundry, vacuuming, etc. as long as the ten-pound restriction is followed.  Incision Care: Your incision site has a dressing over it. That dressing should remain in place and dry at all times for a total of one week after surgery. After one week, you can remove the dressing. Underneath the dressing, you will find skin glue. You should leave the skin glue in place. It will fall off with time. Do not pick, rub, or scrub at it. Do not put cream or lotion over the surgical area. After one week and once the dressing is off, it is okay to let soap and water run over your incision. Again, do not pick, scrub, or rub at the skin glue when bathing. Do not submerge (e.g., take a bath, swim, go in a hot tub, etc.) until six weeks after surgery. There may be some bloody drainage from the incision into the dressing after surgery. This is normal. You do not need to replace the dressing. Continue to leave it in place for the one week as instructed above. Should the dressing become saturated with blood or drainage, please call the office for further instructions.   Medications: You have been prescribed oxycodone . This is a narcotic pain medication and should only be taken as prescribed. You should not drink alcohol or operate heavy machinery (including driving) while taking  this medication. The oxycodone  can cause constipation as a side effect. For that reason, you have been prescribed senna and miralax . These are both laxatives. You do not need to take this medication if you develop diarrhea. Should you remain constipated even while taking these medications, please increase the dose of miralax  to twice daily. Tylenol  has been prescribed to be taken every 8 hours, which will give you additional pain relief. Robaxin  is a muscle relaxer that has been prescribed to you for muscle spasm type pain. Take this medication as needed.   Do not take NSAIDs (ibuprofen, Aleve , Celebrex , naproxen , meloxicam , etc.) for the first 6 weeks after surgery as there is some evidence that their use may decrease the chances of successful fusion.   In order to set expectations for opioid prescriptions, you will only be prescribed opioids for a total of six weeks after surgery and, at two-weeks after surgery, your opioid prescription will start to tapered (decreased dosage and number of pills). If you have ongoing need for opioid medication six weeks after surgery, you will be referred to pain management. If you are already established with a provider that is giving you opioid medications, you should schedule an appointment with them for six weeks after surgery if you feel you are going to need another prescription. State law only allows for opioid prescriptions one week at a time. If you are running out of opioid medication near the end of the week, please call the office during business hours before running out so I can send you another prescription.   You may resume any  home blood thinners (aspirin , warfarin, lovenox , apixaban, plavix, xarelto, etc.) 72 hours after your surgery. Take these medications as they were previously prescribed.   Driving: You should not drive while taking narcotic pain medications. You should start getting back to driving slowly and you may want to try driving in a parking  lot before doing anything more.  Diet: You are safe to resume your regular diet. A common complication after cervical spine surgery is trouble swallowing especially if the incision was made over the front part of your neck. This complication happens often and, the vast majority of the time, it is a temporary problem that gets better with time. The first few days after surgery, you should take more bites than normal to break the food into smaller pieces before swallowing. With time as the swallowing becomes easier, you can gradually get back to taking bites like you normally would.   Reasons to Call the Office After Surgery: You should feel free to call the office with any concerns or questions you have in the post-operative period, but you should definitely notify the office if you develop: -shortness of breath, chest pain, or trouble breathing -excessive bleeding, drainage, redness, or swelling around the surgical site -fevers, chills, or pain that is getting worse with each passing day -persistent nausea or vomiting -new weakness in any extremity, new or worsening numbness or tingling in any extremity -numbness in the groin, bowel or bladder incontinence -other concerns about your surgery  Follow Up Appointments: You should have an office appointment scheduled for approximately two weeks after surgery. If you do not remember when this appointment is or do not already have it scheduled, please call the office to schedule.   Office Information:  -Ozell Ada, MD -Phone number: [phone] -Address: [address]       [place], [place] [address]

## 2024-03-25 NOTE — Transfer of Care (Signed)
 Immediate Anesthesia Transfer of Care Note  Patient: Jordan Sparks  Procedure(s) Performed: ANTERIOR CERVICAL DISCECTOMY FUSION CERVICAL THREE-FOUR AND CERVICAL FIVE-SIX  Patient Location: PACU  Anesthesia Type:General  Level of Consciousness: drowsy  Airway & Oxygen Therapy: Patient Spontanous Breathing and Patient connected to face mask oxygen  Post-op Assessment: Report given to RN and Post -op Vital signs reviewed and stable  Post vital signs: Reviewed and stable  Last Vitals:  Vitals Value Taken Time  BP 164/103 03/25/24 13:30  Temp 36.9 C 03/25/24 13:15  Pulse 94 03/25/24 13:40  Resp 25 03/25/24 13:39  SpO2 94 % 03/25/24 13:40  Vitals shown include unfiled device data.  Last Pain:  Vitals:   03/25/24 0629  TempSrc:   PainSc: 0-No pain      Patients Stated Pain Goal: 0 (03/25/24 9370)  Complications: There were no known notable events for this encounter.

## 2024-03-25 NOTE — Progress Notes (Signed)
 PT placed on CPAP w/ 3L.   03/25/24 2200  BiPAP/CPAP/SIPAP  $ Non-Invasive Ventilator  Non-Invasive Vent Initial  $ Face Mask Medium Yes  BiPAP/CPAP/SIPAP Pt Type Adult  BiPAP/CPAP/SIPAP Resmed  Mask Type Full face mask  Dentures removed? Not applicable  Mask Size Medium  Set Rate 0 breaths/min  IPAP 10 cmH20  EPAP 5 cmH2O  Pressure Support 5 cmH20  PEEP 5 cmH20  Flow Rate 3 lpm  Minute Ventilation 9.8  Leak 0  Peak Inspiratory Pressure (PIP) 11  Tidal Volume (Vt) 501  Patient Home Machine No  Patient Home Mask No  Patient Home Tubing No  Auto Titrate No  Nasal massage performed Yes  CPAP/SIPAP surface wiped down Yes  Device Plugged into RED Power Outlet Yes

## 2024-03-25 NOTE — Anesthesia Procedure Notes (Signed)
 Procedure Name: Intubation Date/Time: 03/25/2024 7:49 AM  Performed by: Vera Rochele PARAS, CRNAPre-anesthesia Checklist: Patient identified, Emergency Drugs available, Suction available and Patient being monitored Patient Re-evaluated:Patient Re-evaluated prior to induction Oxygen Delivery Method: Circle System Utilized Preoxygenation: Pre-oxygenation with 100% oxygen Induction Type: IV induction Ventilation: Mask ventilation without difficulty and Oral airway inserted - appropriate to patient size Laryngoscope Size: Glidescope and 4 Grade View: Grade I Tube type: Oral Number of attempts: 1 Airway Equipment and Method: Stylet and Oral airway Placement Confirmation: ETT inserted through vocal cords under direct vision, positive ETCO2 and breath sounds checked- equal and bilateral Secured at: 25 cm Tube secured with: Tape Dental Injury: Teeth and Oropharynx as per pre-operative assessment

## 2024-03-25 NOTE — Progress Notes (Signed)
 Orthopedic Tech Progress Note Patient Details:  Jordan Sparks 06-08-1975 [id number] Applied soft collar to patient, well tolerated Ortho Devices Type of Ortho Device: Soft collar Ortho Device/Splint Interventions: Ordered, Application, Adjustment   Post Interventions Patient Tolerated: Well Instructions Provided: Adjustment of device  [name] December 03/25/2024, 6:36 PM

## 2024-03-25 NOTE — Discharge Summary (Signed)
 Orthopedic Surgery Discharge Summary  Patient name: Jordan Sparks Patient MRN: [id number] Admit today: 03/25/2024 Discharge date: 03/26/2024  Attending physician: Ozell Ada, MD Final diagnosis: cervical myelopathy  Findings: anterior osteophyte formation at C3/4 and C5/6   Hospital course: Patient is a 49 y.o. male who was admitted after undergoing C3/4 and C5/6 anterior cervical discectomy and fusion. The patient had significant pain immediately after surgery, but pain eventually was controlled with a multimodal regimen including oxycodone . Labs during the hospitalization revealed hypokalemia for which oral supplementation was given. The patient worked with physical therapy who recommended discharge to home. The patient was tolerating an oral diet without issue and was voiding spontaneously after surgery. The patient's vitals were stable on the day of discharge. The patient was medically ready for discharge and was discharge to home on post-operative day one.  Instructions:   Orthopedic Surgery Discharge Instructions  Patient name: Jordan Sparks Shabazz-McGregor Procedure Performed: C3/4, C5/6 anterior cervical discectomy and fusion  Date of Surgery: 03/25/2024 Surgeon: Ozell Ada, MD  Pre-operative Diagnosis: cervical myelopathy Post-operative Diagnosis: same as above  Discharged to: home Discharge Condition: stable  Activity: You do not need any collar or brace after this surgery. You should refrain from bending, lifting, or twisting with objects greater than ten pounds until three months after surgery. You are encouraged to walk as much as desired. You can perform household activities such as cleaning dishes, doing laundry, vacuuming, etc. as long as the ten-pound restriction is followed.  Incision Care: Your incision site has a dressing over it. That dressing should remain in place and dry at all times for a total of one week after surgery. After one week, you can remove  the dressing. Underneath the dressing, you will find skin glue. You should leave the skin glue in place. It will fall off with time. Do not pick, rub, or scrub at it. Do not put cream or lotion over the surgical area. After one week and once the dressing is off, it is okay to let soap and water run over your incision. Again, do not pick, scrub, or rub at the skin glue when bathing. Do not submerge (e.g., take a bath, swim, go in a hot tub, etc.) until six weeks after surgery. There may be some bloody drainage from the incision into the dressing after surgery. This is normal. You do not need to replace the dressing. Continue to leave it in place for the one week as instructed above. Should the dressing become saturated with blood or drainage, please call the office for further instructions.   Medications: You have been prescribed oxycodone . This is a narcotic pain medication and should only be taken as prescribed. You should not drink alcohol or operate heavy machinery (including driving) while taking this medication. The oxycodone  can cause constipation as a side effect. For that reason, you have been prescribed senna and miralax . These are both laxatives. You do not need to take this medication if you develop diarrhea. Should you remain constipated even while taking these medications, please increase the dose of miralax  to twice daily. Tylenol  has been prescribed to be taken every 8 hours, which will give you additional pain relief. Robaxin  is a muscle relaxer that has been prescribed to you for muscle spasm type pain. Take this medication as needed.   Do not take NSAIDs (ibuprofen, Aleve , Celebrex , naproxen , meloxicam , etc.) for the first 6 weeks after surgery as there is some evidence that their use may decrease the chances of  successful fusion.   In order to set expectations for opioid prescriptions, you will only be prescribed opioids for a total of six weeks after surgery and, at two-weeks after surgery,  your opioid prescription will start to tapered (decreased dosage and number of pills). If you have ongoing need for opioid medication six weeks after surgery, you will be referred to pain management. If you are already established with a provider that is giving you opioid medications, you should schedule an appointment with them for six weeks after surgery if you feel you are going to need another prescription. State law only allows for opioid prescriptions one week at a time. If you are running out of opioid medication near the end of the week, please call the office during business hours before running out so I can send you another prescription.   You may resume any home blood thinners (aspirin , warfarin, lovenox , apixaban, plavix, xarelto, etc.) 72 hours after your surgery. Take these medications as they were previously prescribed.   Driving: You should not drive while taking narcotic pain medications. You should start getting back to driving slowly and you may want to try driving in a parking lot before doing anything more.  Diet: You are safe to resume your regular diet. A common complication after cervical spine surgery is trouble swallowing especially if the incision was made over the front part of your neck. This complication happens often and, the vast majority of the time, it is a temporary problem that gets better with time. The first few days after surgery, you should take more bites than normal to break the food into smaller pieces before swallowing. With time as the swallowing becomes easier, you can gradually get back to taking bites like you normally would.   Reasons to Call the Office After Surgery: You should feel free to call the office with any concerns or questions you have in the post-operative period, but you should definitely notify the office if you develop: -shortness of breath, chest pain, or trouble breathing -excessive bleeding, drainage, redness, or swelling around the  surgical site -fevers, chills, or pain that is getting worse with each passing day -persistent nausea or vomiting -new weakness in any extremity, new or worsening numbness or tingling in any extremity -numbness in the groin, bowel or bladder incontinence -other concerns about your surgery  Follow Up Appointments: You should have an office appointment scheduled for approximately two weeks after surgery. If you do not remember when this appointment is or do not already have it scheduled, please call the office to schedule.   Office Information:  -Ozell Ada, MD -Phone number: [phone] -Address: [address]       [place], [place] [address]

## 2024-03-25 NOTE — H&P (Signed)
 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   Plan: -Out of bed as tolerated, activity as tolerated, no brace -Covered the risks of surgery one more time with the patient and patient elected to proceed with planned surgery -Written consent verified -Hold anticoagulation in anticipation of surgery -Ancef  and TXA on all to OR -NPO for procedure -Site marked -To OR when ready  The risks of surgery included but were not limited to: pseudarthrosis, dysphagia, hematoma, airway compromise, recurrent laryngeal nerve injury, esophageal perforation, durotomy, spinal cord injury, nerve root injury, persistent pain, adjacent segment disease, infection, bleeding, hardware failure, vascular injury, heart attack, death, stroke, fracture, and need for additional procedures.  ___________________________________________________________________________  Chief Complaint: imbalance, hand dexterity issues  History: Patient is 49 y.o. male who has been previously seen in the office for symptoms consistent with cervical myelopathy. His MRI showed central stenosis at 2 levels. Given the nature of cervical myelopathy surgery was recommended at his last office visit. The patient presents today with no changes in his symptoms since the last office visit. See previous office note for further details.    Review of systems: General: denies fevers and chills, myalgias Neurologic: denies recent changes in vision, slurred speech Abdomen: denies nausea, vomiting, hematemesis Respiratory: denies cough, shortness of breath  Past medical history: CAD Depression DM (last A1c was 5.7 on 03/20/2024) HTN OSA Asthma   Allergies: lisinopril   Past surgical history:  Right carpal tunnel release   Social history: Reports use of nicotine  product (smoking, vaping, patches, smokeless) Alcohol use: rare  Denies recreational drug use  Family  history: -reviewed and not pertinent to cervical myelopathy   Physical Exam:  General: no acute distress, appears stated age Neurologic: alert, answering questions appropriately, following commands Cardiovascular: regular rate, no cyanosis Respiratory: unlabored breathing on room air, symmetric chest rise Psychiatric: appropriate affect, normal cadence to speech   MSK (spine):  -Strength exam      Left  Right Grip strength                5/5  5/5 Interosseus   5/5   5/5 Wrist extension  5/5  5/5 Wrist flexion   5/5  4/5 Elbow flexion   5/5  4/5 Deltoid    5/5  5/5  EHL    5/5  5/5 TA    5/5  5/5 GSC    5/5  5/5 Knee extension  5/5  5/5 Knee flexion   5/5  5/5 Hip flexion   5/5  5/5  -Sensory exam    Sensation intact to light touch in L3-S1 nerve distributions of bilateral lower extremities  Sensation intact to light touch in C5-T1 nerve distributions of bilateral upper extremities   Patient name: Jordan Sparks Patient MRN: [id number] Date: 03/25/2024

## 2024-03-25 NOTE — Progress Notes (Signed)
 Dr. Leopoldo made aware of patient's elevated BP readings this morning. No new orders received.

## 2024-03-25 NOTE — Brief Op Note (Signed)
 03/25/2024  7:30 AM  1:22 PM  PATIENT:  Jordan Sparks  49 y.o. male  PRE-OPERATIVE DIAGNOSIS:  CERVICAL SPONDYLOTIC MYELOPATHY  POST-OPERATIVE DIAGNOSIS:  CERVICAL SPONDYLOTIC MYELOPATHY  PROCEDURE:  Procedures with comments: ANTERIOR CERVICAL DISCECTOMY FUSION CERVICAL THREE-FOUR AND CERVICAL FIVE-SIX (N/A) - C3-4 and C5-6 anterior cervical discectomy and fusion with anterior instrumentation and allograft spacers.  SURGEON:  Surgeons and Role:    * Georgina Ozell LABOR, MD - Primary  PHYSICIAN ASSISTANT: none  ASSISTANTS: none   ANESTHESIA:   general  EBL:  50 mL   BLOOD ADMINISTERED:none  DRAINS: none   LOCAL MEDICATIONS USED:  NONE  SPECIMEN:  No Specimen  DISPOSITION OF SPECIMEN:  none  COUNTS:  YES  TOURNIQUET:  NONE  DICTATION: .Note written in EPIC  PLAN OF CARE: Admit for overnight observation  PATIENT DISPOSITION:  PACU - hemodynamically stable.   Delay start of Pharmacological VTE agent (>24hrs) due to surgical blood loss or risk of bleeding: yes

## 2024-03-25 NOTE — Progress Notes (Signed)
 Orthopedic Surgery Post-operative Progress Note  Assessment: Patient is a 49 y.o. male who is currently admitted after undergoing C3/4 and C5/6 ACDF   Plan: -Operative plans complete -Needs upright films when able -Drain: none -Out of bed as tolerated, no collar or brace needed -No bending/lifting/twisting greater than 10 pounds -OT evaluation and treat -Pain control -Diabetic diet -Ancef  x2 post-operative doses -No antiplatelet or dvt chemoprophylaxis for 72 hours after surgery -Disposition: to floor from PACU  _________________________________________________________________________  Subjective: No acute events since surgery. Recovering in PACU. Had issues with pain control initially but it is under control now.  Objective:  General: no acute distress, laying in bed Neurologic: drowsy, answering questions appropriately, following commands Respiratory: unlabored breathing on room air Neck: no hematoma appreciated Skin: dressing clear/dry/intact  MSK (spine):  -Strength exam      Right  Left Grip strength                5/5  5/5 Interosseus   5/5   5/5 Wrist extension  5/5  5/5 Wrist flexion   5/5  5/5 Elbow flexion   5/5  5/5 Deltoid    5/5  5/5  -Sensory exam    Sensation intact to light touch in C5-T1 nerve distributions of bilateral upper extremities   Patient name: Jordan Sparks Patient MRN: [id number] Date: 03/25/2024

## 2024-03-26 ENCOUNTER — Encounter (HOSPITAL_COMMUNITY): Payer: Self-pay | Admitting: Orthopedic Surgery

## 2024-03-26 ENCOUNTER — Observation Stay (HOSPITAL_COMMUNITY)

## 2024-03-26 ENCOUNTER — Other Ambulatory Visit (HOSPITAL_COMMUNITY): Payer: Self-pay

## 2024-03-26 DIAGNOSIS — M4712 Other spondylosis with myelopathy, cervical region: Secondary | ICD-10-CM | POA: Diagnosis not present

## 2024-03-26 LAB — CBC
HCT: 43.1 % (ref 39.0–52.0)
Hemoglobin: 14.8 g/dL (ref 13.0–17.0)
MCH: 28.2 pg (ref 26.0–34.0)
MCHC: 34.3 g/dL (ref 30.0–36.0)
MCV: 82.3 fL (ref 80.0–100.0)
Platelets: 257 K/uL (ref 150–400)
RBC: 5.24 MIL/uL (ref 4.22–5.81)
RDW: 15.5 % (ref 11.5–15.5)
WBC: 14.7 K/uL — ABNORMAL HIGH (ref 4.0–10.5)
nRBC: 0 % (ref 0.0–0.2)

## 2024-03-26 LAB — BASIC METABOLIC PANEL WITH GFR
Anion gap: 11 (ref 5–15)
BUN: 10 mg/dL (ref 6–20)
CO2: 29 mmol/L (ref 22–32)
Calcium: 9.2 mg/dL (ref 8.9–10.3)
Chloride: 98 mmol/L (ref 98–111)
Creatinine, Ser: 0.93 mg/dL (ref 0.61–1.24)
GFR, Estimated: >60 mL/min
Glucose, Bld: 123 mg/dL — ABNORMAL HIGH (ref 70–99)
Potassium: 3.3 mmol/L — ABNORMAL LOW (ref 3.5–5.1)
Sodium: 138 mmol/L (ref 135–145)

## 2024-03-26 LAB — GLUCOSE, CAPILLARY
Glucose-Capillary: 126 mg/dL — ABNORMAL HIGH (ref 70–99)
Glucose-Capillary: 116 mg/dL — ABNORMAL HIGH (ref 70–99)

## 2024-03-26 MED ORDER — POTASSIUM CHLORIDE 20 MEQ PO PACK
20.0000 meq | PACK | Freq: Once | ORAL | Status: DC
  Filled 2024-03-26: qty 1

## 2024-03-26 NOTE — Progress Notes (Signed)
 Orthopedic Surgery Post-operative Progress Note  Assessment: Patient is a 49 y.o. male who is currently admitted after undergoing C3/4 and C5/6 ACDF   Plan: -Operative plans complete -Needs upright films when able -Drain: none -Out of bed as tolerated, no collar or brace needed -No bending/lifting/twisting greater than 10 pounds -OT evaluation and treat -Pain control -Diabetic diet -Ancef  x2 post-operative doses -No antiplatelet or dvt chemoprophylaxis for 72 hours after surgery -Anticipate discharge to home today  _________________________________________________________________________  Subjective: No acute events overnight. Had difficulty sleeping last night due to the mattress. Having some pain with swallowing but is able to to tolerate PO. No radiating arm pain. Neck pain well controlled.   Objective:  General: no acute distress, laying in bed Neurologic: drowsy, answering questions appropriately, following commands Respiratory: unlabored breathing on room air Neck: no hematoma appreciated Skin: dressing clear/dry/intact  MSK (spine):  -Strength exam      Right  Left Grip strength                5/5  5/5 Interosseus   5/5   5/5 Wrist extension  5/5  5/5 Wrist flexion   5/5  5/5 Elbow flexion   5/5  5/5 Deltoid    5/5  5/5  -Sensory exam    Sensation intact to light touch in C5-T1 nerve distributions of bilateral upper extremities   Patient name: Jordan Sparks Patient MRN: [id number] Date: 03/26/2024

## 2024-03-26 NOTE — Plan of Care (Signed)
Pt doing well. Pt and family given D/C instructions with verbal understanding. Rx's were sent to the pharmacy by MD. Pt's incision is clean and dry with no sign of infection. Pt's IV was removed prior to D/C. Pt D/C'd home via walking per MD order. Pt is stable @ D/C and has no other needs at this time. Ryleah Miramontes, RN  

## 2024-03-26 NOTE — Anesthesia Postprocedure Evaluation (Signed)
"   Anesthesia Post Note  Patient: Jordan Sparks  Procedure(s) Performed: ANTERIOR CERVICAL DISCECTOMY FUSION CERVICAL THREE-FOUR AND CERVICAL FIVE-SIX     Patient location during evaluation: PACU Anesthesia Type: General Level of consciousness: awake and alert Pain management: pain level controlled Vital Signs Assessment: post-procedure vital signs reviewed and stable Respiratory status: spontaneous breathing, nonlabored ventilation and respiratory function stable Cardiovascular status: blood pressure returned to baseline and stable Postop Assessment: no apparent nausea or vomiting Anesthetic complications: no   There were no known notable events for this encounter.                  Shaquisha Wynn      "

## 2024-03-26 NOTE — Evaluation (Signed)
 Occupational Therapy Evaluation and Discharge Patient Details Name: Jordan Sparks MRN: [id number] DOB: 1976/02/02 Today's Date: 03/26/2024   History of Present Illness   Pt is a 49 year old man admitted on 03/25/24 for C 3-4, C 5-6 ACDF. PMH: CAD, depression, DM, HTN, OSA, asthma.     Clinical Impressions Pt and wife educated in cervical precautions, reinforced with written handout. Instructed in body mechanics adhering to precautions. Pt is functioning mod I in basic self care and mobility. He has excellent support of his family when he returns home. No further OT needs.      If plan is discharge home, recommend the following:   Assistance with cooking/housework;Assist for transportation     Functional Status Assessment   Patient has had a recent decline in their functional status and demonstrates the ability to make significant improvements in function in a reasonable and predictable amount of time.     Equipment Recommendations   None recommended by OT     Recommendations for Other Services         Precautions/Restrictions   Precautions Precautions: Cervical Precaution Booklet Issued: Yes (comment) Required Braces or Orthoses: Cervical Brace Cervical Brace: Soft collar Restrictions Weight Bearing Restrictions Per Provider Order: No     Mobility Bed Mobility Overal bed mobility: Modified Independent                  Transfers Overall transfer level: Modified independent Equipment used: None                      Balance                                           ADL either performed or assessed with clinical judgement   ADL Overall ADL's : Modified independent                                             Vision Ability to See in Adequate Light: 0 Adequate       Perception         Praxis         Pertinent Vitals/Pain Pain Assessment Pain Assessment: Faces Faces Pain Scale:  Hurts even more Pain Location: throat Pain Descriptors / Indicators: Sore Pain Intervention(s): Monitored during session     Extremity/Trunk Assessment Upper Extremity Assessment Upper Extremity Assessment: Overall WFL for tasks assessed   Lower Extremity Assessment Lower Extremity Assessment: Overall WFL for tasks assessed   Cervical / Trunk Assessment Cervical / Trunk Assessment: Neck Surgery   Communication Communication Communication: No apparent difficulties   Cognition Arousal: Alert Behavior During Therapy: WFL for tasks assessed/performed Cognition: No apparent impairments                               Following commands: Intact       Cueing  General Comments   Cueing Techniques: Verbal cues      Exercises     Shoulder Instructions      Home Living Family/patient expects to be discharged to:: Private residence Living Arrangements: Spouse/significant other;Children Available Help at Discharge: Family;Available PRN/intermittently Type of Home: House Home Access: Stairs to enter [employer] of Steps:  3   Home Layout: One level                          Prior Functioning/Environment Prior Level of Function : Independent/Modified Independent;Driving;Working/employed                    OT Problem List:     OT Treatment/Interventions:        OT Goals(Current goals can be found in the care plan section)       OT Frequency:       Co-evaluation              AM-PAC OT 6 Clicks Daily Activity     Outcome Measure Help from another person eating meals?: None Help from another person taking care of personal grooming?: None Help from another person toileting, which includes using toliet, bedpan, or urinal?: None Help from another person bathing (including washing, rinsing, drying)?: None Help from another person to put on and taking off regular upper body clothing?: None Help from another person to put  on and taking off regular lower body clothing?: None 6 Click Score: 24   End of Session    Activity Tolerance: Patient tolerated treatment well Patient left: in bed;with call bell/phone within reach;with family/visitor present  OT Visit Diagnosis: Pain                Time: 9164-9144 OT Time Calculation (min): 20 min Charges:  OT General Charges $OT Visit: 1 Visit OT Evaluation $OT Eval Low Complexity: 1 Low  Mliss HERO, OTR/L Acute Rehabilitation Services Office: [phone]   Kennth Mliss Helling 03/26/2024, 8:58 AM

## 2024-04-07 ENCOUNTER — Other Ambulatory Visit: Payer: Self-pay

## 2024-04-07 ENCOUNTER — Encounter: Admitting: Orthopedic Surgery

## 2024-04-10 ENCOUNTER — Other Ambulatory Visit: Payer: Self-pay

## 2024-04-10 ENCOUNTER — Ambulatory Visit: Admitting: Orthopedic Surgery

## 2024-04-10 DIAGNOSIS — Z981 Arthrodesis status: Secondary | ICD-10-CM

## 2024-04-11 NOTE — Progress Notes (Signed)
 Orthopedic Surgery Post-operative Office Visit  Procedure: C3/4 and C5/6 ACDF Date of Surgery: 03/25/2024 (~2 weeks post-op)  Assessment: Patient is a 49 y.o. male who is doing well after surgery   Plan: -Operative plans complete -Out of bed as tolerated, no collar/brace needed -No bending/lifting/twisting greater than 10 pounds -Pain management: tylenol  as needed  -Okay to let soap/water run over the incision but do not submerge  -Return to office in 4 weeks, x-rays needed at next visit: AP/lateral cervical  ___________________________________________________________________________   Subjective: Patient was discharged to home after his hospitalization. He has been doing well since discharge. His pain has gotten significantly better and he is no longer requiring any narcotics to control his neck pain. He is not having any radiating arm pain. He feels that his balance has improved since surgery. He has not dropped any objects since surgery. Has not noticed any redness or drainage around his incision.   Objective:  General: no acute distress, appropriate affect Neurologic: alert, answering questions appropriately, following commands Respiratory: unlabored breathing on room air Skin: incision appears to be healing as appropriately with no evidence of infection, no drainage  MSK (spine):  -Strength exam      Left  Right Grip strength                5/5  5/5 Interosseus   5/5   5/5 Wrist extension  5/5  5/5 Wrist flexion   5/5  5/5 Elbow flexion   5/5  5/5 Deltoid    5/5  5/5  -Sensory exam    Sensation intact to light touch in C5-T1 nerve distributions of bilateral upper extremities   Imaging: X-rays of the cervical spine from 04/10/2024 were independently reviewed and interpreted, showing allograft interbody devices at C3/4 and C5/6. Allografts in appropriate position. There are two anterior cervical plates over the C3/4 and C5/6 disc spaces. There is no lucency seen around  the associated screws. No fracture or dislocation seen.    Patient name: Jordan Sparks Patient MRN: [id number] Date of visit: 04/10/2024

## 2024-04-17 ENCOUNTER — Other Ambulatory Visit (HOSPITAL_COMMUNITY): Payer: Self-pay

## 2024-05-08 ENCOUNTER — Encounter: Admitting: Orthopedic Surgery

## 2024-05-23 ENCOUNTER — Ambulatory Visit: Admitting: Endocrinology

## 2024-06-16 ENCOUNTER — Ambulatory Visit (HOSPITAL_BASED_OUTPATIENT_CLINIC_OR_DEPARTMENT_OTHER)

## 2024-06-26 ENCOUNTER — Encounter (HOSPITAL_BASED_OUTPATIENT_CLINIC_OR_DEPARTMENT_OTHER): Admitting: Family Medicine
